# Patient Record
Sex: Male | Born: 1965 | Race: White | Hispanic: No | Marital: Married | Smoking: Former smoker
Health system: Southern US, Community
[De-identification: ages and names within clinical notes are randomized; demographics above are authoritative.]

## PROBLEM LIST (undated history)

## (undated) DIAGNOSIS — R270 Ataxia, unspecified: Secondary | ICD-10-CM

## (undated) DIAGNOSIS — R42 Dizziness and giddiness: Secondary | ICD-10-CM

## (undated) DIAGNOSIS — E785 Hyperlipidemia, unspecified: Secondary | ICD-10-CM

## (undated) DIAGNOSIS — I10 Essential (primary) hypertension: Secondary | ICD-10-CM

## (undated) DIAGNOSIS — I209 Angina pectoris, unspecified: Secondary | ICD-10-CM

## (undated) DIAGNOSIS — R55 Syncope and collapse: Secondary | ICD-10-CM

## (undated) DIAGNOSIS — E119 Type 2 diabetes mellitus without complications: Secondary | ICD-10-CM

## (undated) DIAGNOSIS — I2510 Atherosclerotic heart disease of native coronary artery without angina pectoris: Secondary | ICD-10-CM

## (undated) HISTORY — DX: Atherosclerotic heart disease of native coronary artery without angina pectoris: I25.10

## (undated) HISTORY — DX: Syncope and collapse: R55

## (undated) HISTORY — DX: Ataxia, unspecified: R27.0

## (undated) HISTORY — DX: Dizziness and giddiness: R42

---

## 2008-09-08 ENCOUNTER — Ambulatory Visit (HOSPITAL_COMMUNITY): Admission: RE | Admit: 2008-09-08 | Discharge: 2008-09-09 | Payer: Self-pay | Admitting: Cardiology

## 2008-09-08 IMAGING — CR DG CHEST 2V
2 series · 2 of 2 positions shown · non-contrast
Comparison: None.

CLINICAL DATA: Chest pain, diabetes, history of smoking

CHEST - 2 VIEW

[view not recorded (1 of 2)]
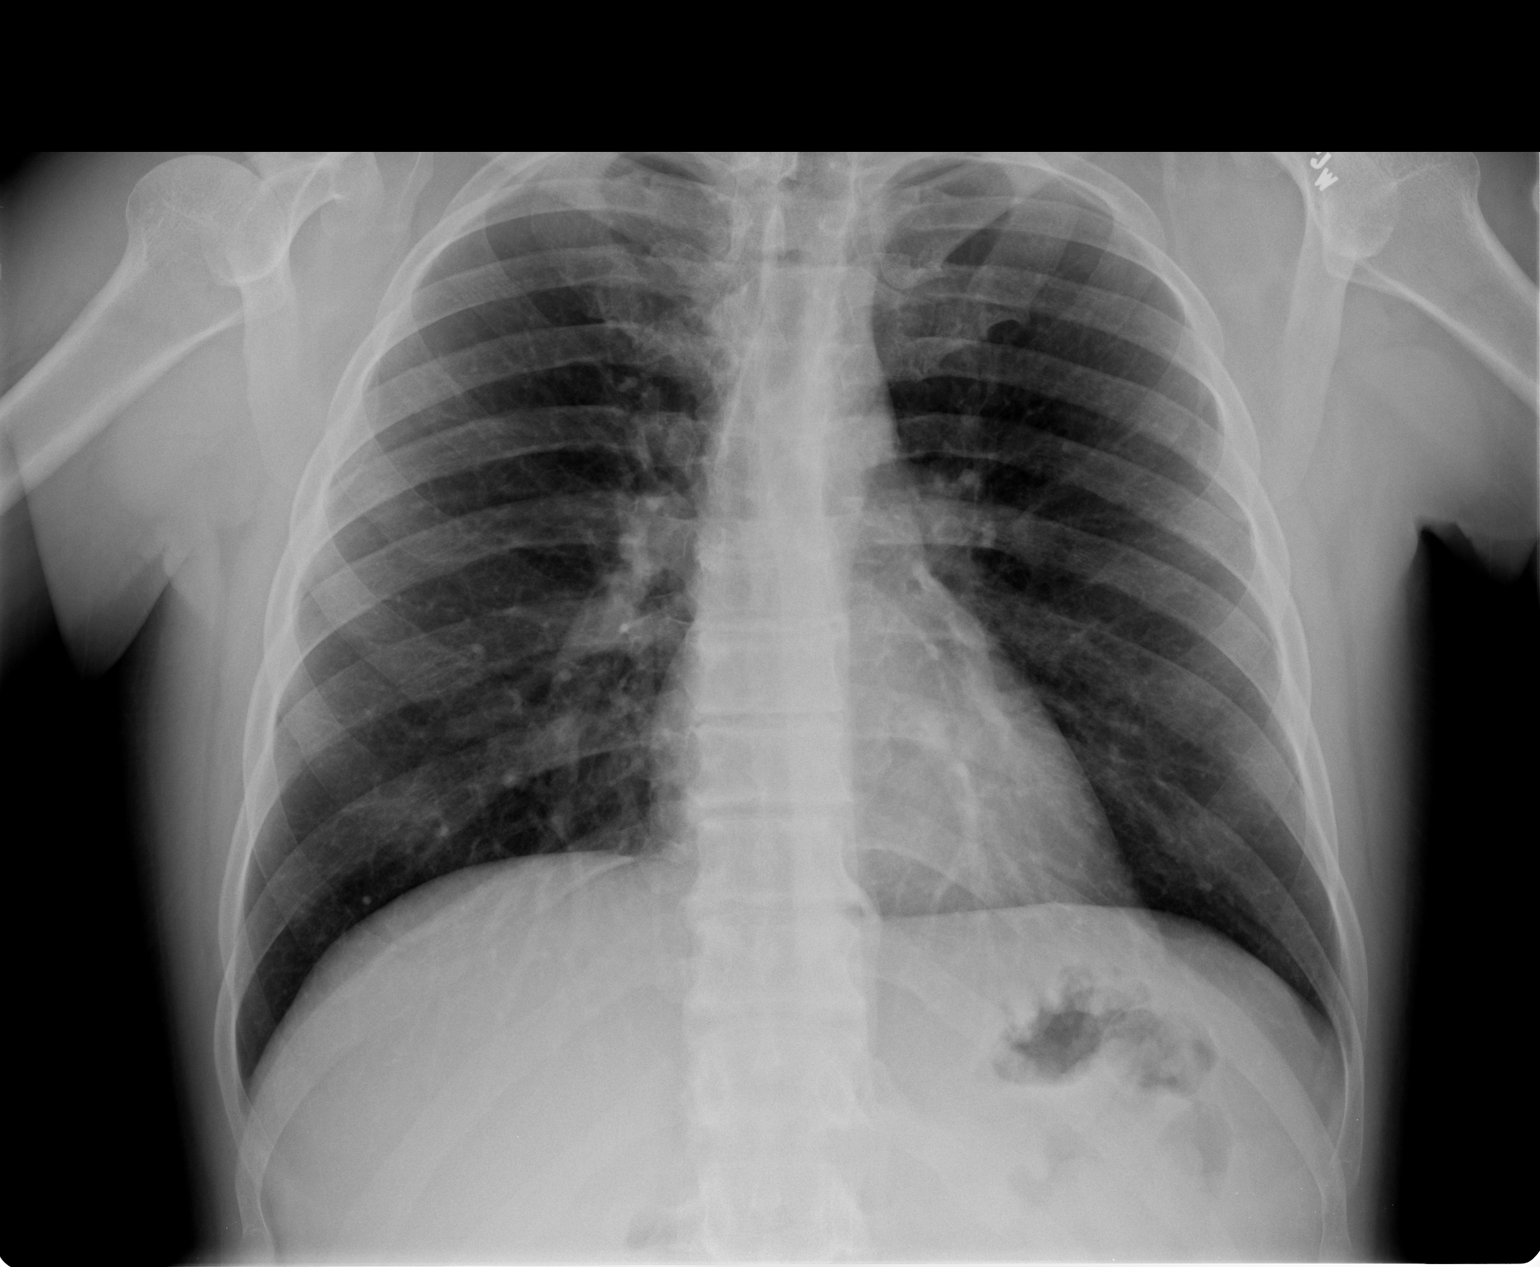

[view not recorded (2 of 2)]
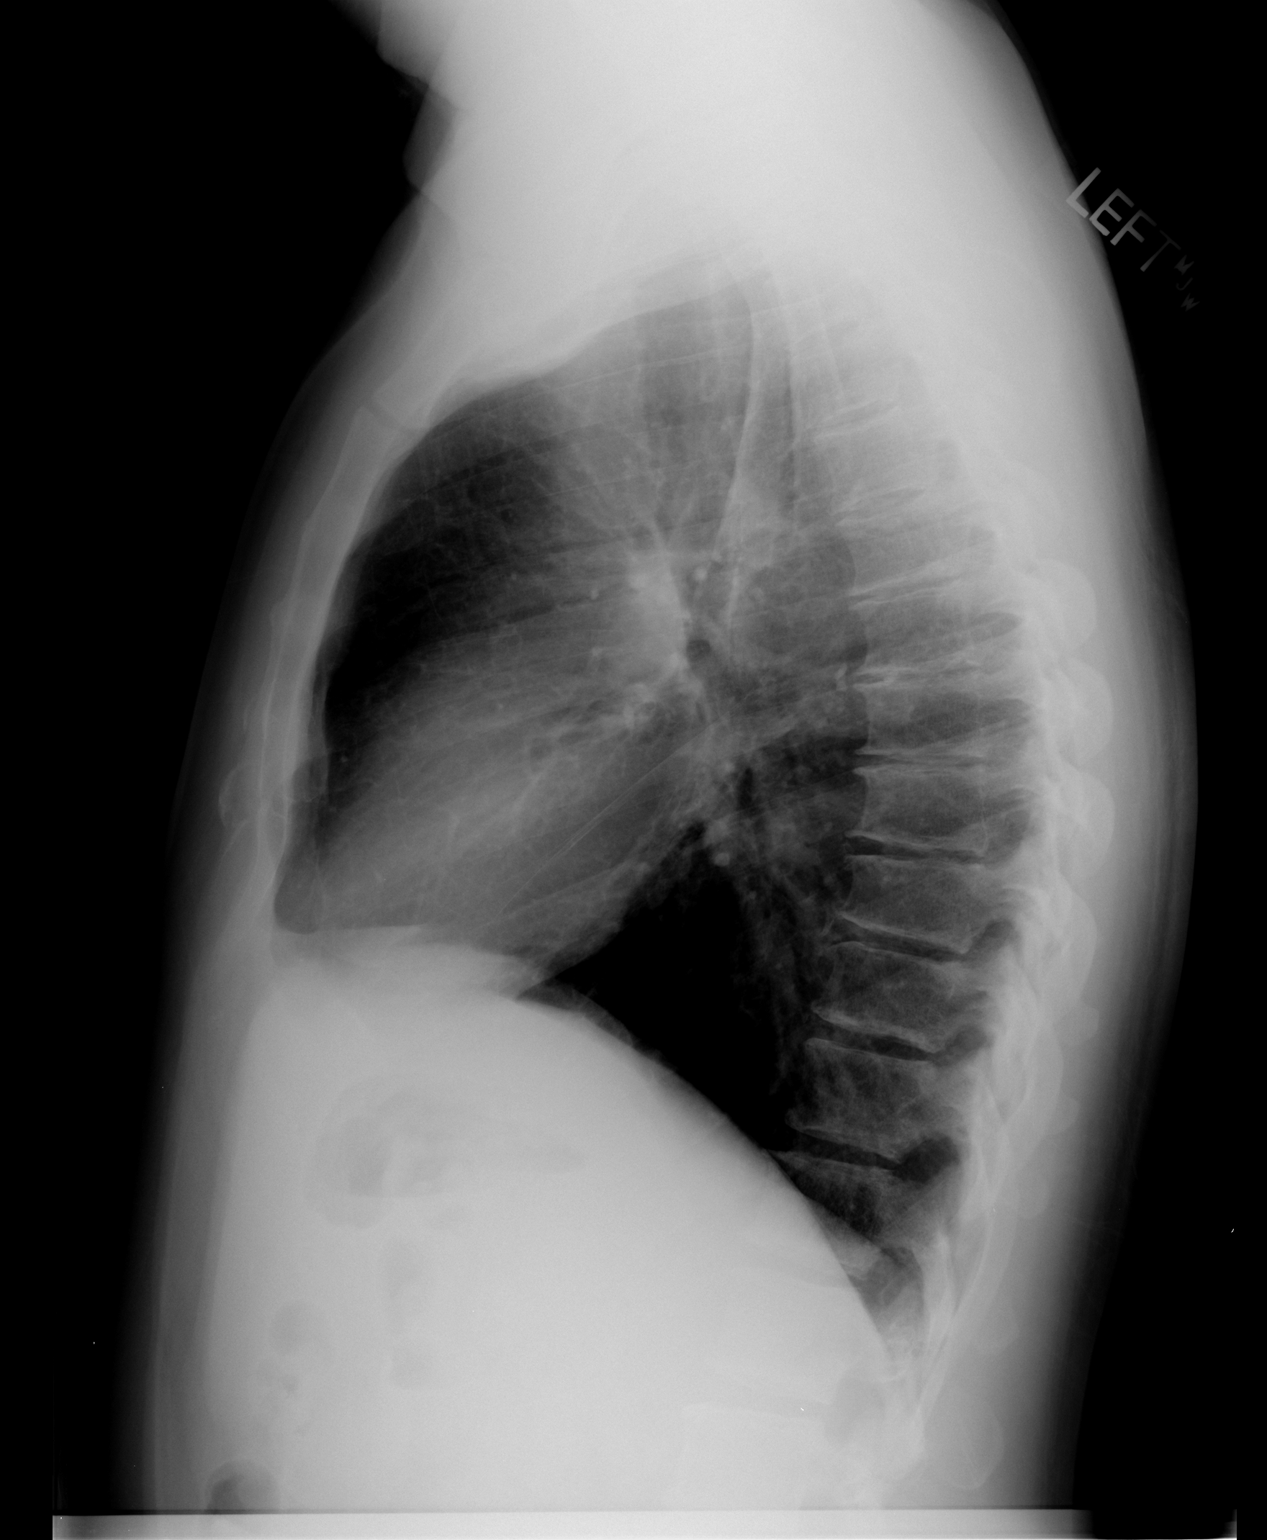

[2 of 2 positions shown; findings below may reference images not displayed]

FINDINGS: The heart size and mediastinal contours are within normal
limits.  Both lungs are clear.  The visualized skeletal structures
are unremarkable.
IMPRESSION: No active cardiopulmonary disease.

## 2010-09-26 LAB — GLUCOSE, CAPILLARY
Glucose-Capillary: 126 mg/dL — ABNORMAL HIGH (ref 70–99)
Glucose-Capillary: 80 mg/dL (ref 70–99)
Glucose-Capillary: 86 mg/dL (ref 70–99)

## 2010-09-26 LAB — URINALYSIS, ROUTINE W REFLEX MICROSCOPIC
Hgb urine dipstick: NEGATIVE
Nitrite: NEGATIVE
Specific Gravity, Urine: 1.016 (ref 1.005–1.030)
Urobilinogen, UA: 1 mg/dL (ref 0.0–1.0)
pH: 7.5 (ref 5.0–8.0)

## 2010-09-26 LAB — CBC
HCT: 44.8 % (ref 39.0–52.0)
Hemoglobin: 15.5 g/dL (ref 13.0–17.0)
MCHC: 34.5 g/dL (ref 30.0–36.0)
MCV: 89.4 fL (ref 78.0–100.0)
RBC: 5.01 MIL/uL (ref 4.22–5.81)

## 2010-09-26 LAB — DIFFERENTIAL
Basophils Relative: 1 % (ref 0–1)
Eosinophils Relative: 2 % (ref 0–5)
Monocytes Absolute: 0.5 10*3/uL (ref 0.1–1.0)
Monocytes Relative: 8 % (ref 3–12)
Neutro Abs: 4.4 10*3/uL (ref 1.7–7.7)

## 2010-09-26 LAB — APTT: aPTT: 27 seconds (ref 24–37)

## 2010-09-26 LAB — BASIC METABOLIC PANEL
CO2: 26 mEq/L (ref 19–32)
Calcium: 9.7 mg/dL (ref 8.4–10.5)
Chloride: 105 mEq/L (ref 96–112)
GFR calc Af Amer: >60 mL/min (ref >60)
Glucose, Bld: 118 mg/dL — ABNORMAL HIGH (ref 70–99)
Potassium: 4.2 mEq/L (ref 3.5–5.1)
Sodium: 137 mEq/L (ref 135–145)

## 2010-10-29 NOTE — Cardiovascular Report (Signed)
NAMEROBEN, SCHLIEP NO.:  [id number]   MEDICAL RECORD NO.:  [id number]          PATIENT TYPE:  OIB   LOCATION:  3703                         FACILITY:  [facility]   PHYSICIAN:  Sheliah Mends, MD      DATE OF BIRTH:  18-Feb-1966   DATE OF PROCEDURE:  09/08/2008  DATE OF DISCHARGE:                            CARDIAC CATHETERIZATION   INDICATIONS:  This is a 45 year old gentleman who presents with sudden  onset of exertional dyspnea and chest pain.  He has multiple risk  factors for coronary artery disease including uncontrolled type 2  diabetes mellitus with an Hb1 Ac of routine, severe dyslipidemia.  His  triglyceride levels in the 500s and a strong family history of coronary  artery disease.  Given the acuity of his symptoms and his risk factor  profile, a decision was made to proceed directly to cardiac  catheterization.   PROCEDURES:  1. Left heart catheterization.  2. Aortic root angiography.  3. Selective coronary angiography.  4. Left ventriculogram.   The patient was brought in the postabsorptive state to the Second Floor  Cath Lab at [facility].  After informed consent was obtained,  he was started on conscious sedation using fentanyl and Versed.  His  right groin was prepped in sterile fashion.  Access to the right femoral  artery was with 5-French sheath using the modified Seldinger technique  was achieved without complications.  Subsequently, selective coronary  angiography using Judkins left and right catheter was performed.  The  left ventriculogram was performed with a standard pigtail catheter.   FINDINGS:  Selective coronary angiography, left main coronary artery is  short.  It has mild irregularities.  It trifurcates in the left anterior  descending coronary artery, ramus intermedius, and left circumflex  artery.  The left anterior descending coronary artery is a large vessel  that wraps around the apex and has mild irregularities in the  mid  segment, it has a medium-size diagonal one branch.  There is no  angiographically significant disease seen at their left-to-right  collaterals noted.   Ramus intermedius.  The ramus mid intermedius is a medium-size vessel.  It has a 40% lesion in the mid segment.   The left circumflex artery.  The left circumflex artery is a medium-  sized vessel that gives off an obtuse marginal one.  It has a 50% lesion  in the mid segment of the vessel that gives off left-to-right  collaterals.   Right coronary artery.  The right coronary artery was visualized with an  aortic route chart.  It is 99% occluded at the ostium in a small vessel.   Left ventriculogram.  Left ventriculogram showed overall preserved left  ventricular function estimated at 60% with mild inferior hypokinesis.  There is no significant mitral regurgitation or aortic regurgitation  noted.   HEMODYNAMICS:  Aortic pressure 109/59 mmHg, left ventricular pressure  109/0 mmHg.   The procedure was completed without complication.  Dr. Daphene Jaeger was  present throughout the entire procedure and prompted the case.      Sheliah Mends, MD  Electronically  Signed     JE/MEDQ  D:  09/08/2008  T:  09/09/2008  Job:  [id number]

## 2010-11-01 NOTE — Discharge Summary (Signed)
[name], [name] NO.:  [id number]   MEDICAL RECORD NO.:  [id number]          PATIENT TYPE:  OIB   LOCATION:  3703                         FACILITY:  [facility]   PHYSICIAN:  Sheliah Mends, MD      DATE OF BIRTH:  1966/04/20   DATE OF ADMISSION:  09/08/2008  DATE OF DISCHARGE:  09/09/2008                               DISCHARGE SUMMARY   DISCHARGE DIAGNOSES:  1. Chest pain.  2. Coronary artery disease with 99% ostial occlusion of the right      coronary artery with left-to-right collaterals and mild inferior      hypokinesis.  3. Diabetes mellitus type 2.  4. Dyslipidemia.   DISCHARGE CONDITION:  Improved.   PROCEDURES:  Combined left heart catheterization on September 08, 2008, at 6  p.m.  Please see dictated report.   DISCHARGE MEDICATIONS:  1. Cymbalta 30 mg daily.  2. Enteric-coated aspirin 81 mg daily.  3. __________ 150 mg daily.  4. Metformin 500 mg 2 twice a day, hold for 2 days.  5. Glipizide 10 mg daily.  6. Pravastatin 20 mg at bedtime.   DISCHARGE INSTRUCTIONS:  Follow up with Dr. Garen Lah in 1 week.  Office  will call with date and time.   HISTORY OF PRESENT ILLNESS:  A 45 year old gentleman seen by Dr.  Garen Lah in the office with complaints of exertional chest pain, chest  pressure associated with radiation to the left arm with fatigue and  general weakness.  He has multiple risk factors for coronary artery  disease with presentation to his primary care with uncontrolled diabetes  and hemoglobin A1c of greater than 14.  He also has dyslipidemia with  triglycerides of 826 and HDL of 24.  He was started on fenofibrate.  Dr.  Garen Lah saw him on March 25, and due to his multiple risk factors and  with an abnormal resting EKG, it was felt he should be further evaluated  with cardiac catheterization.  He underwent cardiac catheterization as  described.  Due to the lateness of the hour for the cardiac cath, he was  kept overnight on telemetry and  was seen the next morning after  ambulating, and the patient was ready for discharge home.   He will follow up as an outpatient with Dr. Garen Lah.   PHYSICAL EXAMINATION:  VITAL SIGNS:  Blood pressure at discharge 99/63,  pulse 62, respiratory rate 18, temperature 97.  LUNGS:  Clear to auscultation bilaterally.  HEART:  Regular rate and rhythm.  ABDOMEN:  Benign.  EXTREMITIES:  Without edema.  Cath site stable.   LABORATORY DATA:  Hemoglobin on the 26th 15, hematocrit 44, WBC 6.9,  platelets 191, neutrophils 65, lymph 25, mono 8, eos 2, baso 1.  Protime  13, INR of 1, PTT 27.  Sodium 137, potassium 4.2, chloride 105, CO2 of  26, glucose 118, BUN 12, and creatinine 0.65.  Urinalysis was clear  except ketones were 15.      [name]. [name], N.P.      Sheliah Mends, MD  Electronically Signed    LRI/MEDQ  D:  09/19/2008  T:  09/20/2008  Job:  [id number]   cc:   Louanna Raw

## 2020-09-20 ENCOUNTER — Emergency Department (HOSPITAL_COMMUNITY): Payer: Medicaid Other

## 2020-09-20 ENCOUNTER — Encounter (HOSPITAL_COMMUNITY): Payer: Self-pay | Admitting: Emergency Medicine

## 2020-09-20 ENCOUNTER — Emergency Department (HOSPITAL_COMMUNITY)
Admission: EM | Admit: 2020-09-20 | Discharge: 2020-09-20 | Disposition: A | Discharge status: Home / Self Care | Payer: Medicaid Other | Attending: Emergency Medicine | Admitting: Emergency Medicine

## 2020-09-20 ENCOUNTER — Other Ambulatory Visit: Payer: Self-pay

## 2020-09-20 DIAGNOSIS — Z794 Long term (current) use of insulin: Secondary | ICD-10-CM | POA: Diagnosis not present

## 2020-09-20 DIAGNOSIS — R42 Dizziness and giddiness: Secondary | ICD-10-CM | POA: Diagnosis present

## 2020-09-20 DIAGNOSIS — R270 Ataxia, unspecified: Secondary | ICD-10-CM | POA: Insufficient documentation

## 2020-09-20 DIAGNOSIS — E119 Type 2 diabetes mellitus without complications: Secondary | ICD-10-CM | POA: Insufficient documentation

## 2020-09-20 DIAGNOSIS — R9431 Abnormal electrocardiogram [ECG] [EKG]: Secondary | ICD-10-CM | POA: Diagnosis not present

## 2020-09-20 DIAGNOSIS — Z7982 Long term (current) use of aspirin: Secondary | ICD-10-CM | POA: Diagnosis not present

## 2020-09-20 DIAGNOSIS — I2510 Atherosclerotic heart disease of native coronary artery without angina pectoris: Secondary | ICD-10-CM | POA: Insufficient documentation

## 2020-09-20 DIAGNOSIS — Z7984 Long term (current) use of oral hypoglycemic drugs: Secondary | ICD-10-CM | POA: Diagnosis not present

## 2020-09-20 HISTORY — DX: Type 2 diabetes mellitus without complications: E11.9

## 2020-09-20 LAB — CBC
HCT: 43.1 % (ref 39.0–52.0)
Hemoglobin: 15.1 g/dL (ref 13.0–17.0)
MCH: 30.4 pg (ref 26.0–34.0)
MCHC: 35 g/dL (ref 30.0–36.0)
MCV: 86.9 fL (ref 80.0–100.0)
Platelets: 259 10*3/uL (ref 150–400)
RBC: 4.96 MIL/uL (ref 4.22–5.81)
RDW: 12.6 % (ref 11.5–15.5)
WBC: 8 10*3/uL (ref 4.0–10.5)
nRBC: 0 % (ref 0.0–0.2)

## 2020-09-20 LAB — TROPONIN I (HIGH SENSITIVITY)
Troponin I (High Sensitivity): 30 ng/L — ABNORMAL HIGH (ref <18)
Troponin I (High Sensitivity): 25 ng/L — ABNORMAL HIGH (ref <18)

## 2020-09-20 LAB — BASIC METABOLIC PANEL
Anion gap: 7 (ref 5–15)
BUN: 20 mg/dL (ref 6–20)
CO2: 29 mmol/L (ref 22–32)
Calcium: 10.1 mg/dL (ref 8.9–10.3)
Chloride: 99 mmol/L (ref 98–111)
Creatinine, Ser: 0.88 mg/dL (ref 0.61–1.24)
GFR, Estimated: >60 mL/min (ref >60)
Glucose, Bld: 206 mg/dL — ABNORMAL HIGH (ref 70–99)
Potassium: 4.8 mmol/L (ref 3.5–5.1)
Sodium: 135 mmol/L (ref 135–145)

## 2020-09-20 IMAGING — CT CT ANGIO HEAD
1 of 11 series · 5 of 33 positions shown · IV contrast (OMNI 350)
Comparison: None.

CLINICAL DATA: Dizziness



[Series 11: cta neck axial · axial · 0.39mm/px · z∈[+1033,+1241]mm · 5 of 312 slices shown]
[im 52/312  soft-tissue]
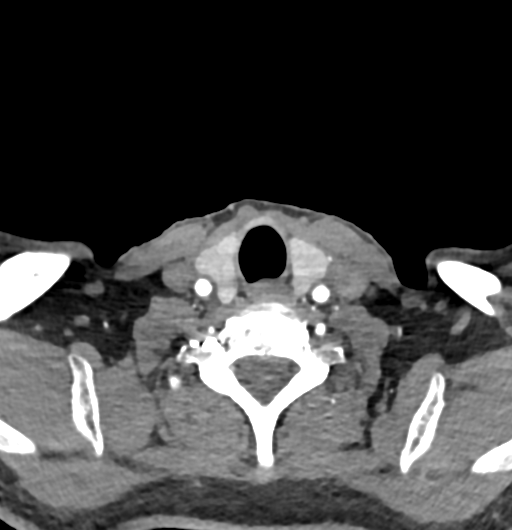
[im 104/312  bone]
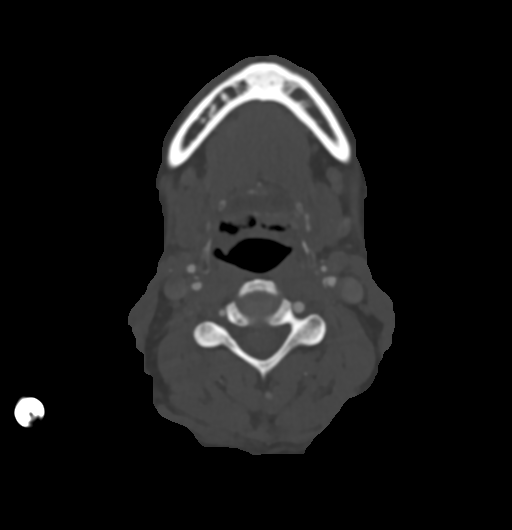
[im 156/312  soft-tissue]
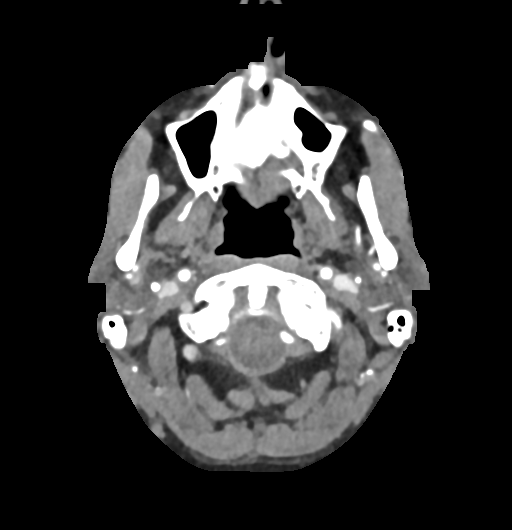
[im 208/312  bone]
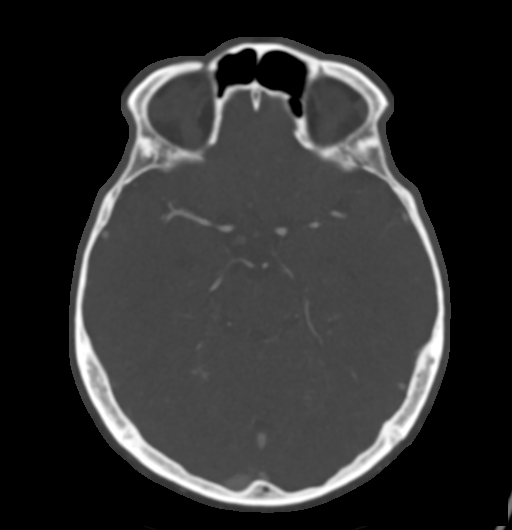
[im 260/312  soft-tissue]
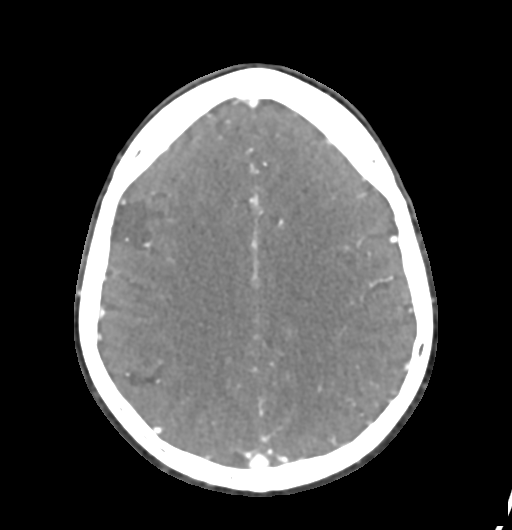

[5 of 33 positions shown; findings below may reference images not displayed]

FINDINGS: CT HEAD FINDINGS

Brain: There is no mass, hemorrhage or extra-axial collection. The
size and configuration of the ventricles and extra-axial CSF spaces
are normal. There is no acute or chronic infarction. The brain
parenchyma is normal.

Skull: The visualized skull base, calvarium and extracranial soft
tissues are normal.

Sinuses/Orbits: No fluid levels or advanced mucosal thickening of
the visualized paranasal sinuses. No mastoid or middle ear effusion.
The orbits are normal.

CTA NECK FINDINGS

SKELETON: There is no bony spinal canal stenosis. No lytic or
blastic lesion.

OTHER NECK: Normal pharynx, larynx and major salivary glands. No
cervical lymphadenopathy. Unremarkable thyroid gland.

UPPER CHEST: No pneumothorax or pleural effusion. No nodules or
masses.

RIGHT CAROTID SYSTEM: Carotid web at the level of the bifurcation.
There is noncalcified plaque within the proximal ICA causes stenosis
of 50%.

LEFT CAROTID SYSTEM: Carotid web. Mixed density atherosclerosis
within the proximal ICA with less than 50% stenosis.

VERTEBRAL ARTERIES: Left dominant configuration. Severe narrowing of
the left origin. Both V2 and V3 segments are normal.

CTA HEAD FINDINGS

POSTERIOR CIRCULATION:

--Vertebral arteries: Normal V4 segments.

--Inferior cerebellar arteries: Normal.

--Basilar artery: Normal.

--Superior cerebellar arteries: Normal.

--Posterior cerebral arteries (PCA): Normal.

ANTERIOR CIRCULATION:

--Intracranial internal carotid arteries: Atherosclerotic
calcification of the internal carotid arteries at the skull base
without hemodynamically significant stenosis.

--Anterior cerebral arteries (ACA): Normal. Both A1 segments are
present. Patent anterior communicating artery (a-comm).

--Middle cerebral arteries (MCA): Normal.

VENOUS SINUSES: As permitted by contrast timing, patent.

ANATOMIC VARIANTS: None

Review of the MIP images confirms the above findings.
IMPRESSION: 1. No emergent large vessel occlusion or high-grade stenosis of the
intracranial arteries.
2. Bilateral carotid webs with 50% stenosis of the proximal right
ICA and less than 50% stenosis of the proximal left ICA. Both
regions could serve as embolic sources.
3. Severe narrowing of the left vertebral artery origin.

## 2020-09-20 IMAGING — US US AORTA
1 series · 14 of 25 positions shown · non-contrast
Comparison: None.

CLINICAL DATA: Screen for AAA

EXAM:
ULTRASOUND OF ABDOMINAL AORTA
TECHNIQUE: Ultrasound examination of the abdominal aorta and proximal common
iliac arteries was performed to evaluate for aneurysm. Additional
color and Doppler images of the distal aorta were obtained to
document patency.

[Series 1: us aorta · 14 of 25 slices shown]
[im 1/25]
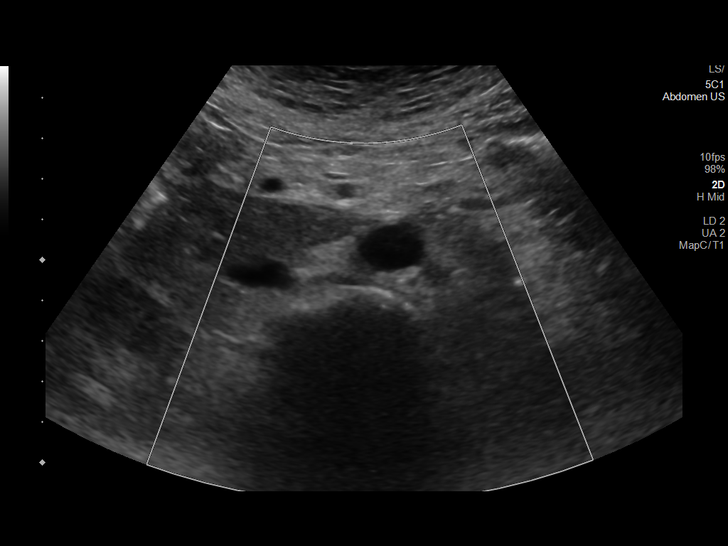
[im 3/25]
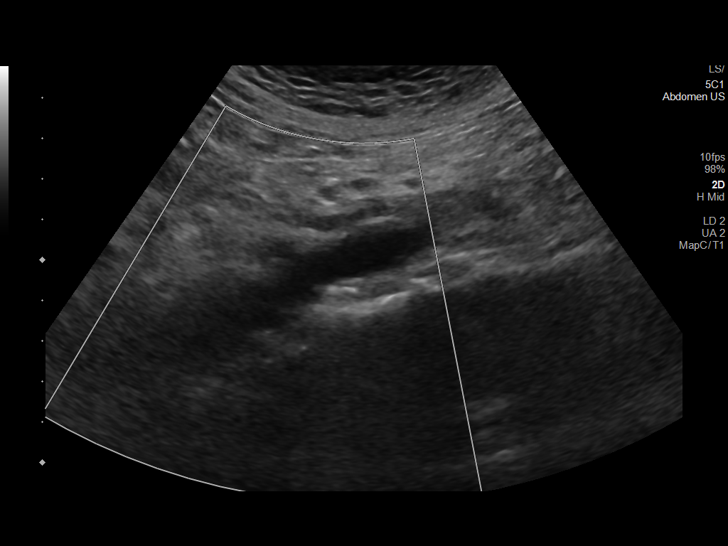
[im 5/25]
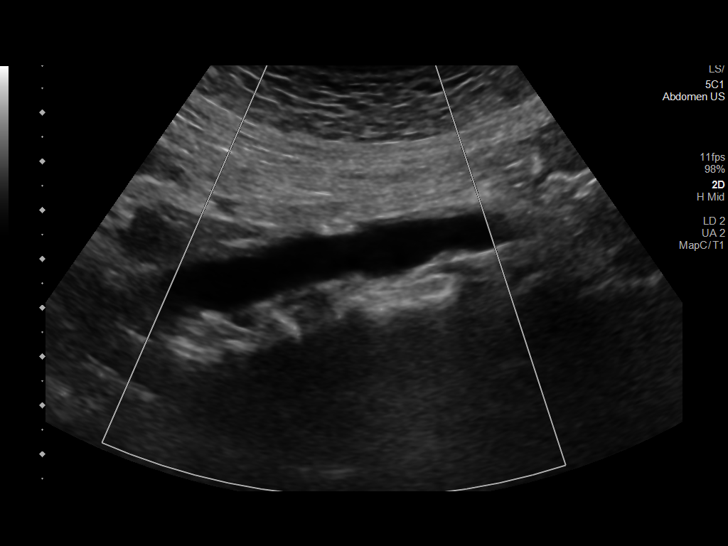
[im 7/25]
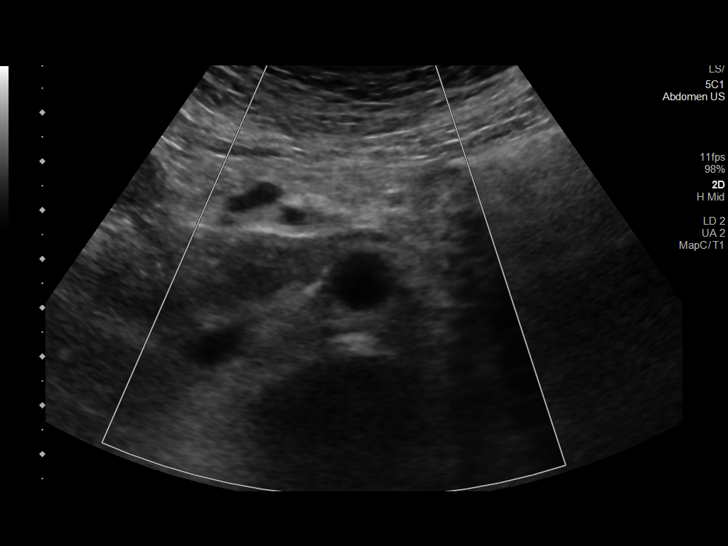
[im 9/25]
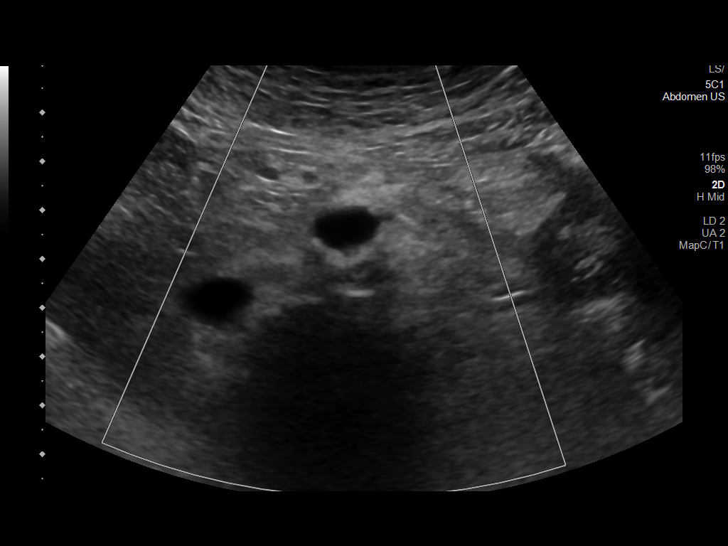
[im 10/25]
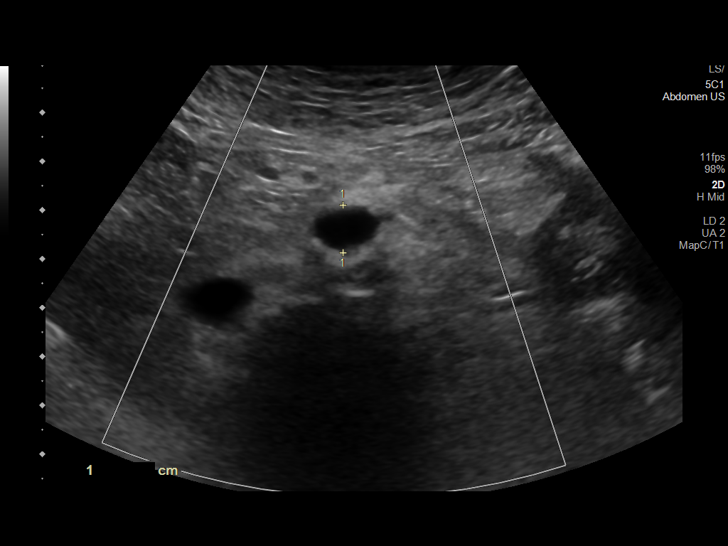
[im 12/25]
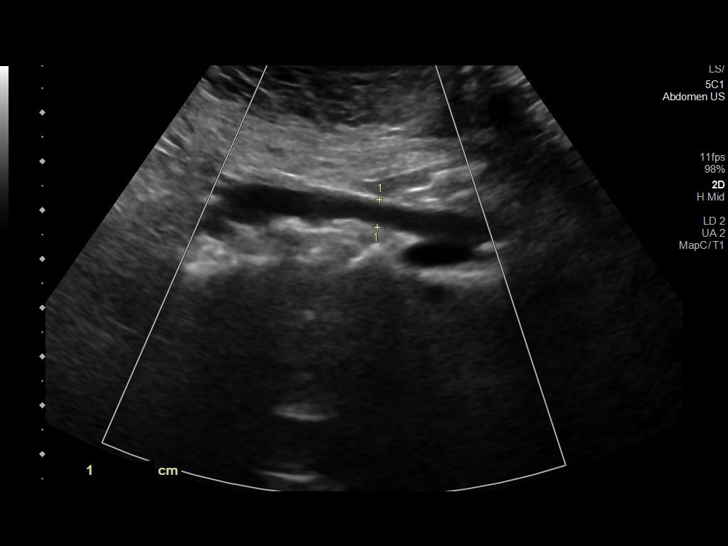
[im 14/25]
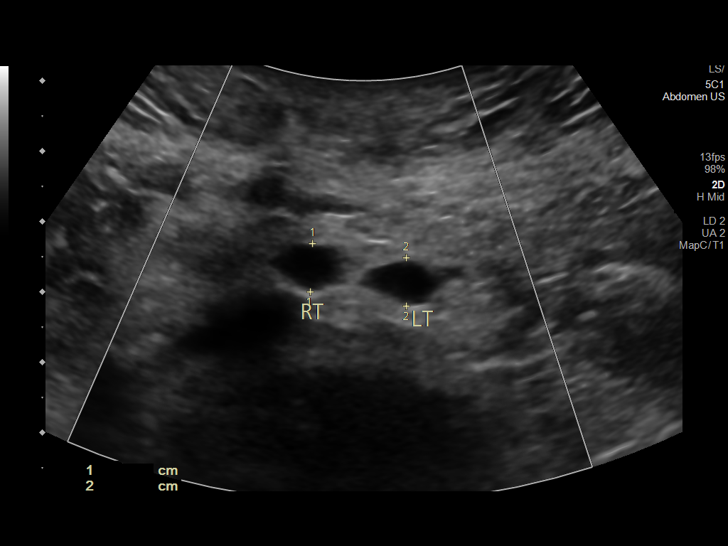
[im 16/25]
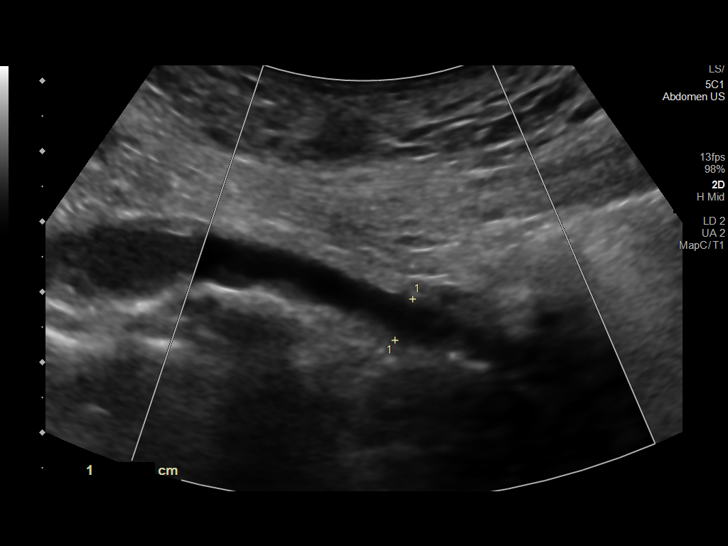
[im 17/25]
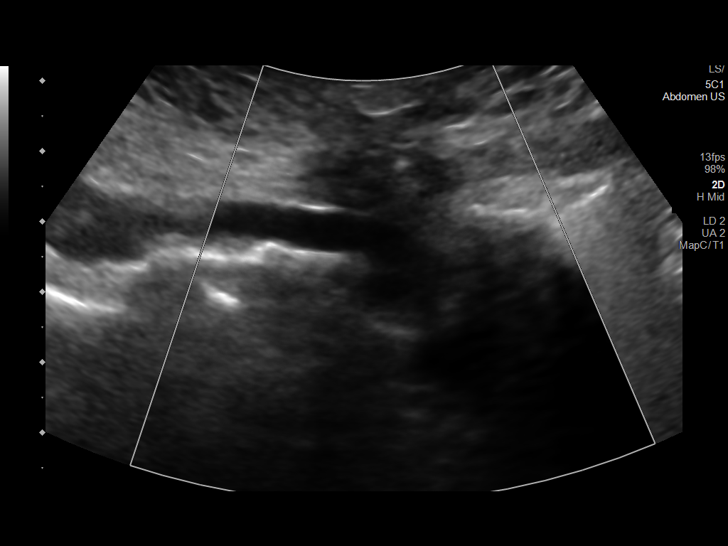
[im 19/25]
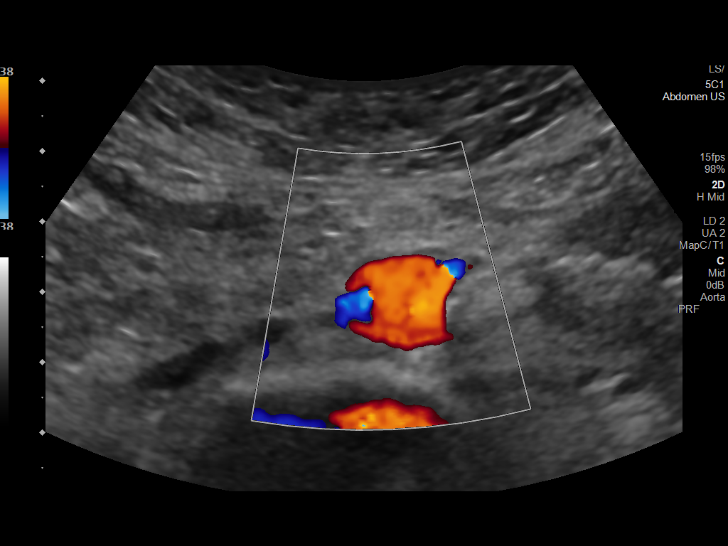
[im 21/25]
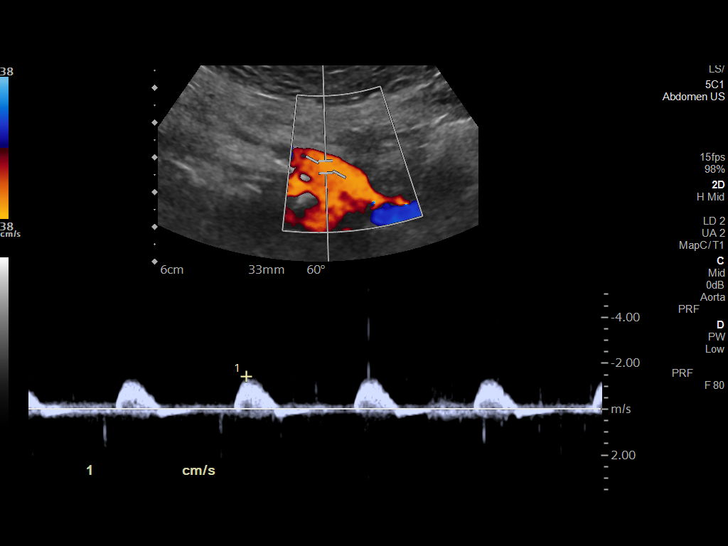
[im 23/25]
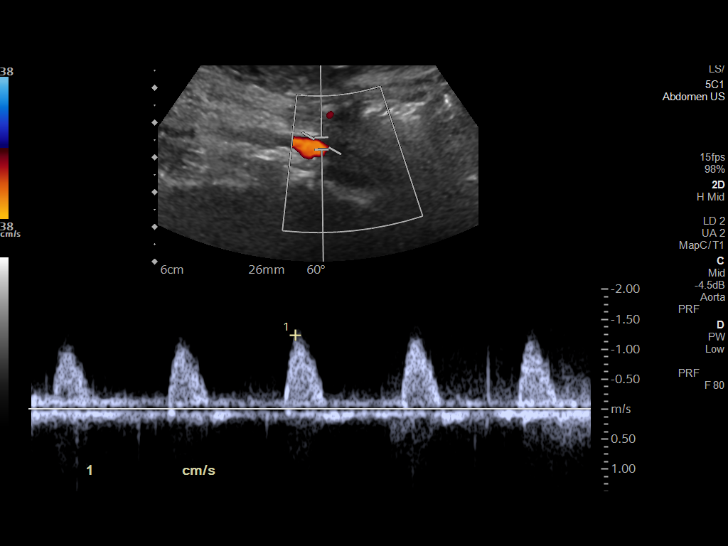
[im 25/25]
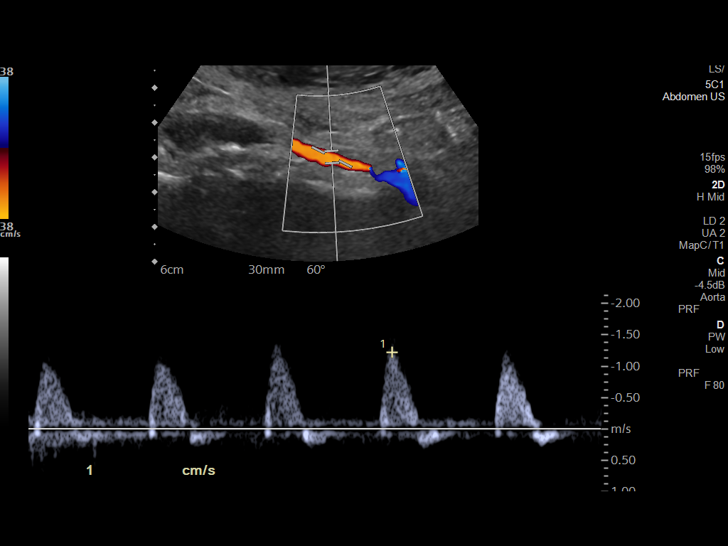

[14 of 25 positions shown; findings below may reference images not displayed]

FINDINGS: Abdominal aortic measurements as follows:

Proximal:  1.5 cm

Mid:  1.3 cm

Distal:  1.0 cm
Patent: Yes, peak systolic velocity is 142 cm/s

Right common iliac artery: 0.7 cm

Left common iliac artery: 0.7 cm
IMPRESSION: No evidence of abdominal aortic aneurysm.

## 2020-09-20 IMAGING — CR DG CHEST 2V
2 series · 2 of 2 positions shown · non-contrast
Comparison: CT chest [DATE].  Chest x-ray [DATE].

CLINICAL DATA: Chest pain.

EXAM:
CHEST - 2 VIEW

[chest pa]
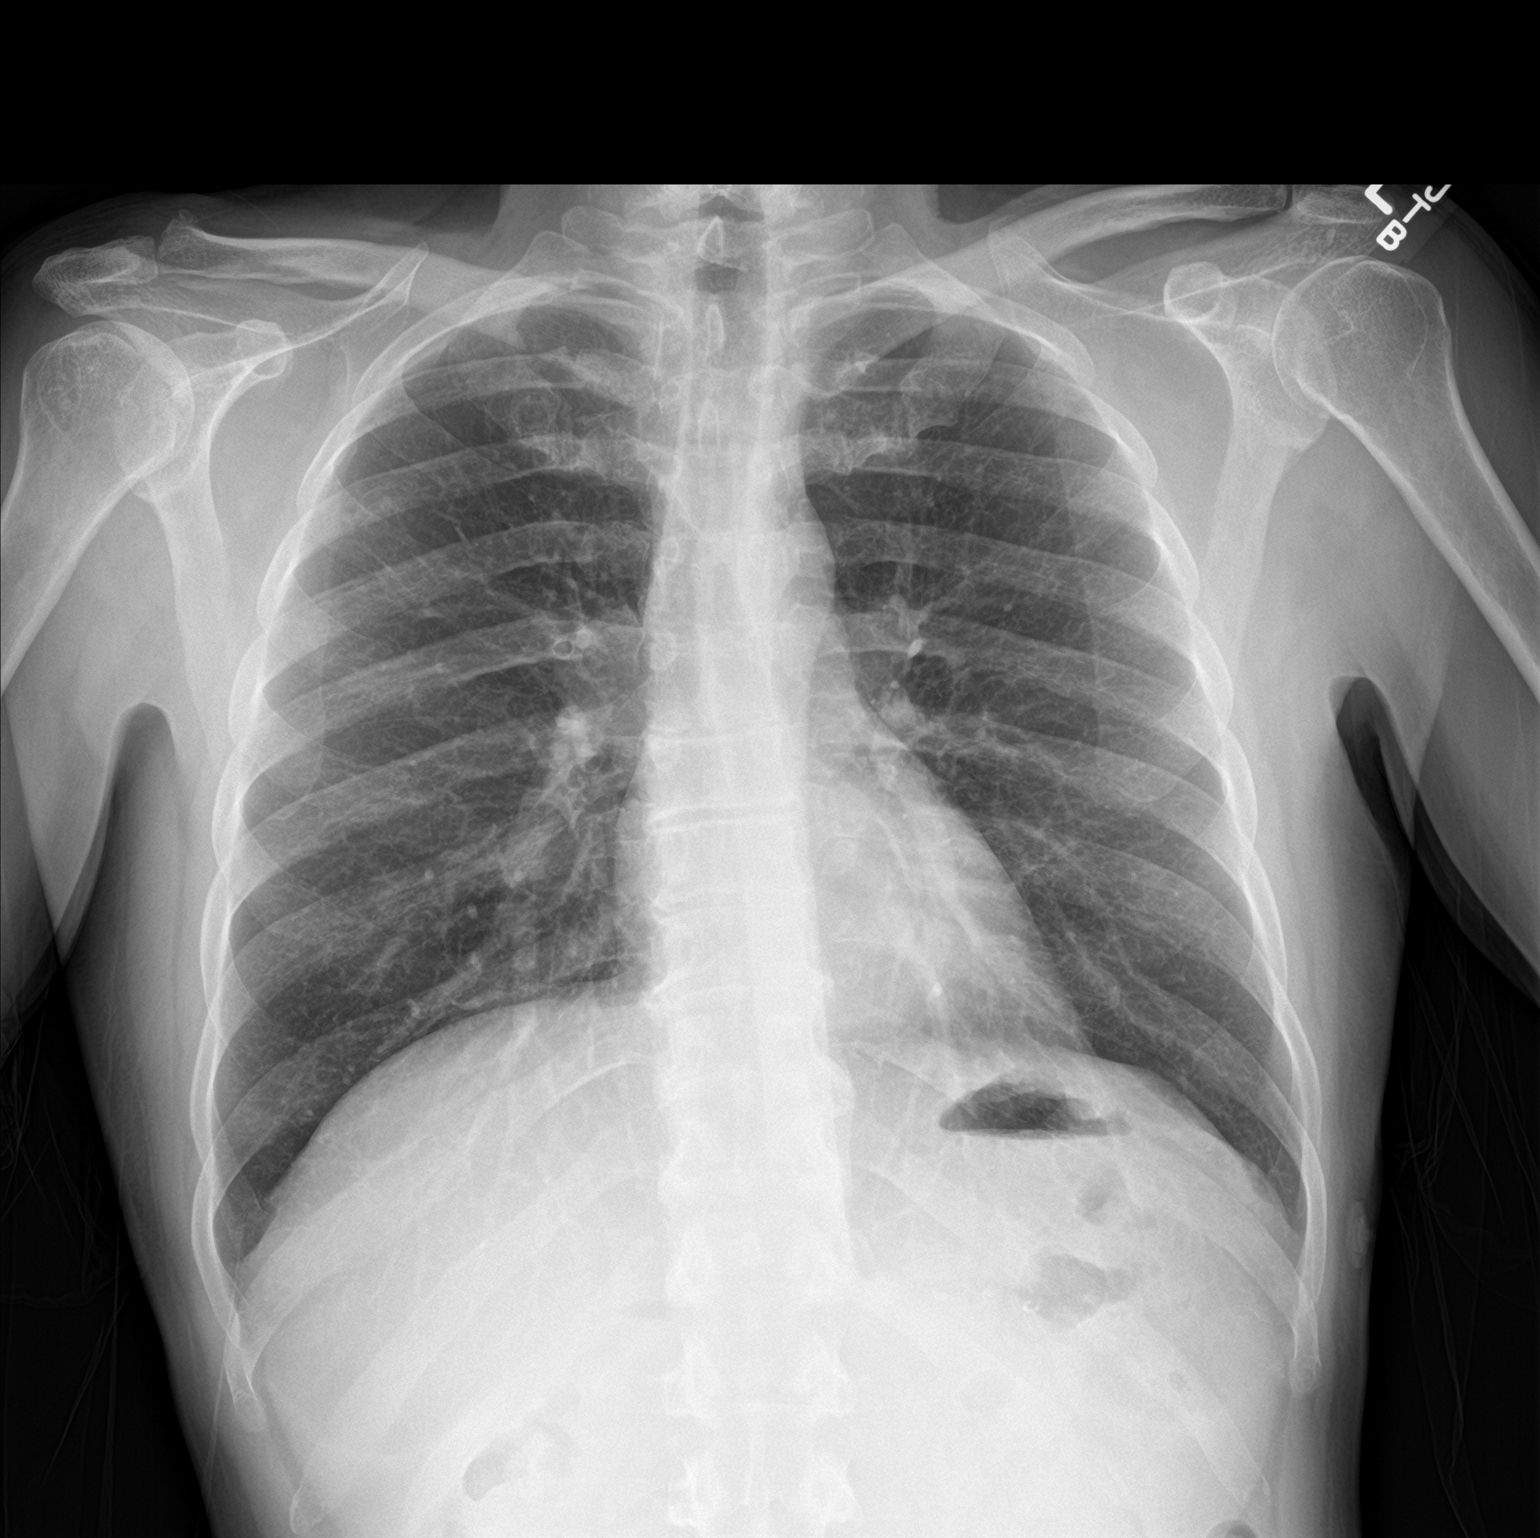

[chest lat]
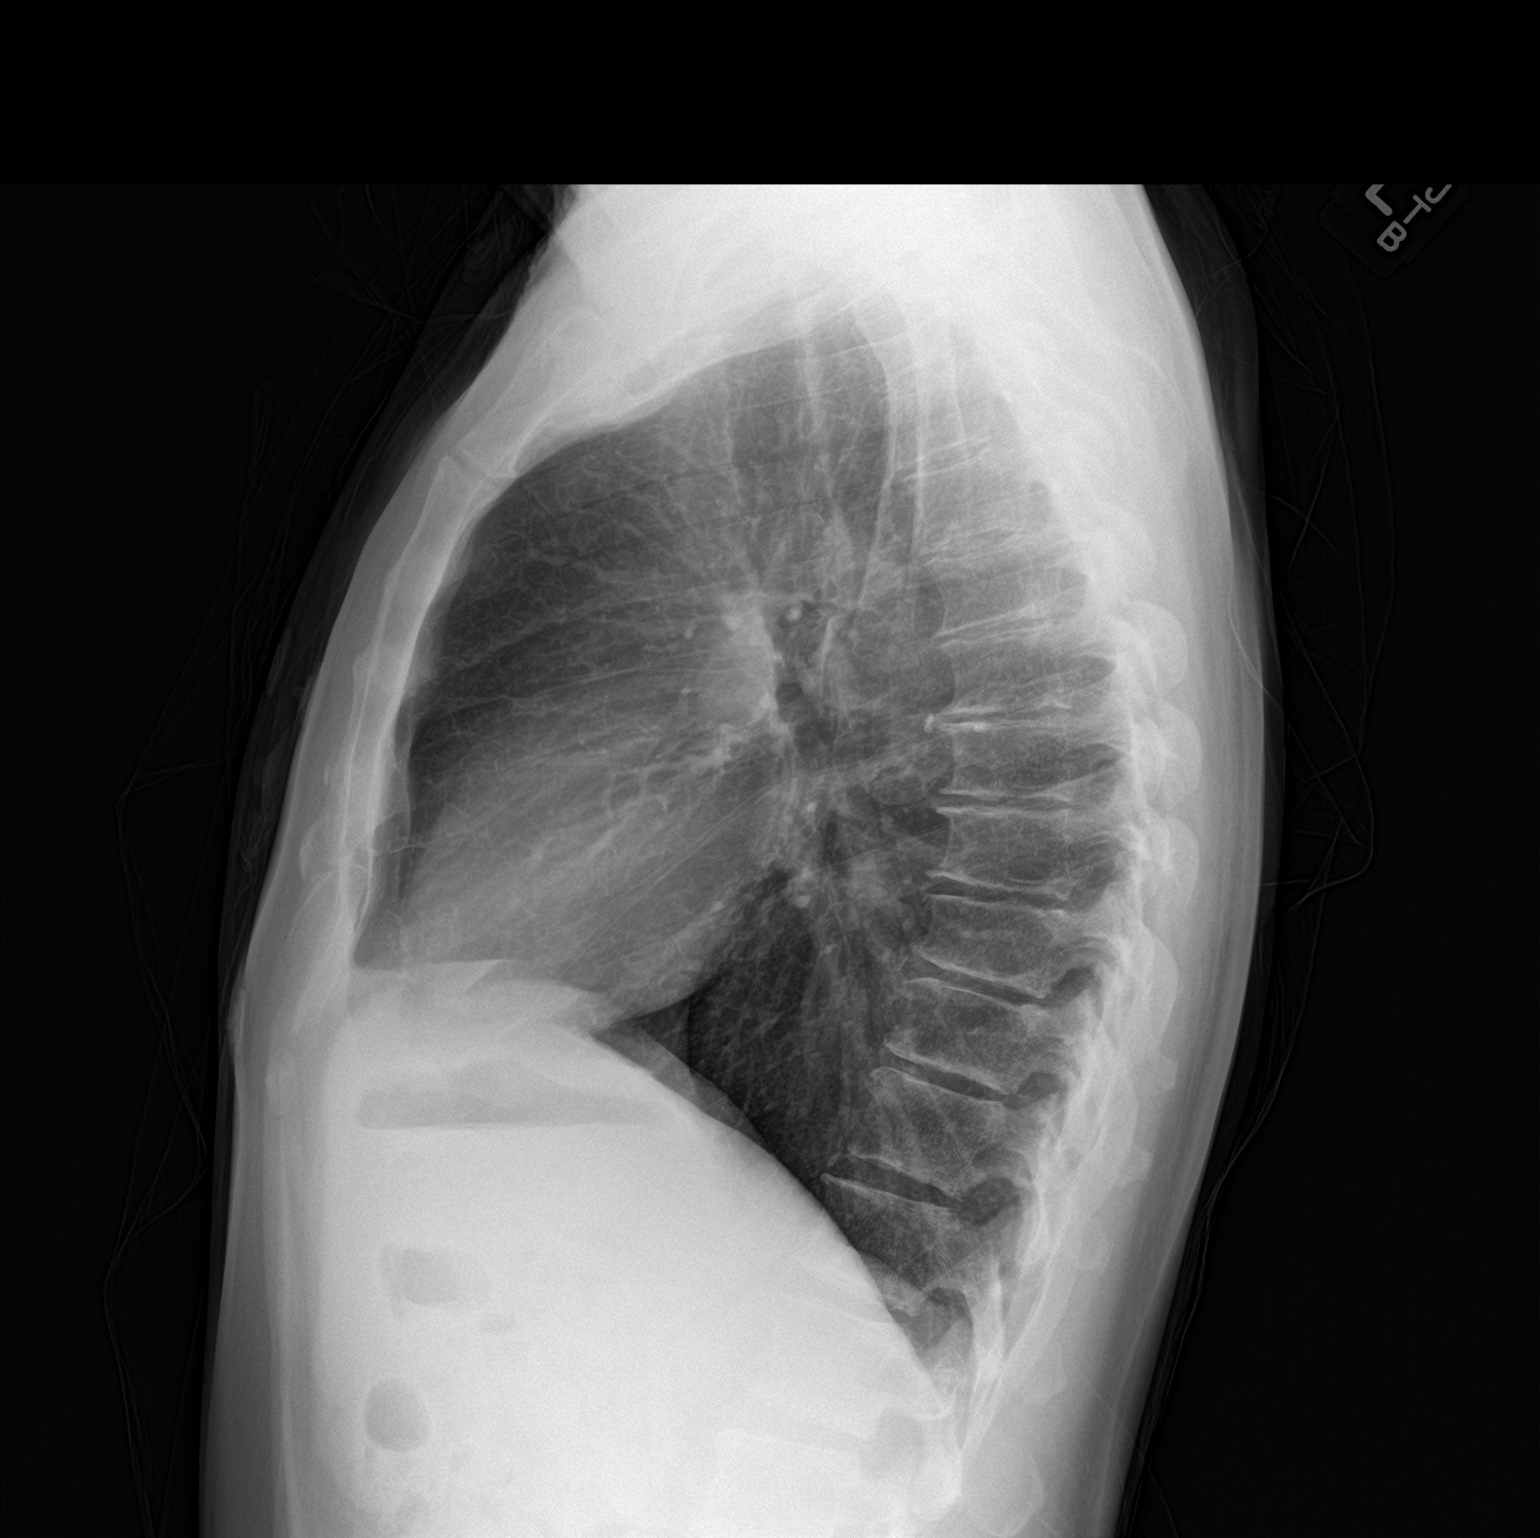

[2 of 2 positions shown; findings below may reference images not displayed]

FINDINGS: Mediastinum and hilar structures normal. Lungs are clear. No pleural
effusion or pneumothorax. Heart size normal. No acute bony
abnormality. Degenerative change thoracic spine.
IMPRESSION: No acute cardiopulmonary disease.

## 2020-09-20 IMAGING — MR MR HEAD W/O CM
6 of 11 series · 24 of 48 positions shown · non-contrast
Comparison: Prior CTA from earlier the same day.

CLINICAL DATA: Initial evaluation for acute dizziness.

EXAM:
MRI HEAD WITHOUT CONTRAST
TECHNIQUE: Multiplanar, multiecho pulse sequences of the brain and surrounding
structures were obtained without intravenous contrast.

[Series 2: DWI · axial · 3.0mm · 0.94mm/px · z∈[-76,+70]mm · 7 of 105 slices shown (1 of 2)]
[im 1/105]
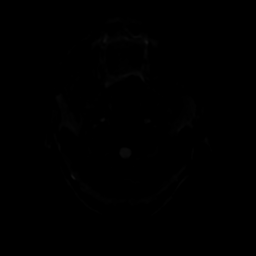
[im 18/105]
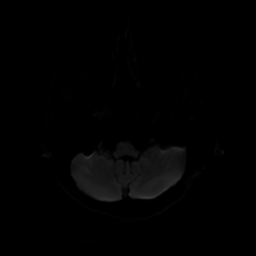
[im 35/105]
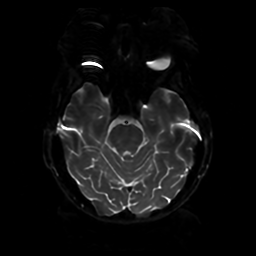
[im 53/105]
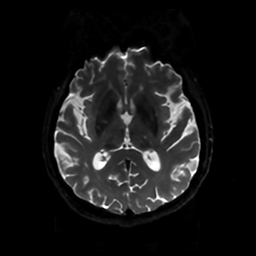
[im 70/105]
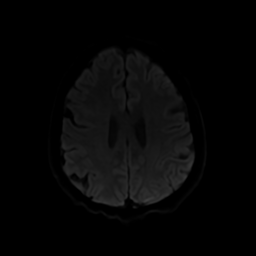
[im 87/105]
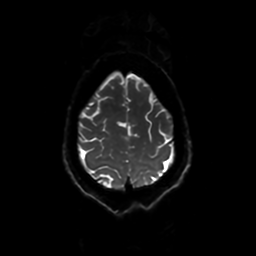
[im 105/105]
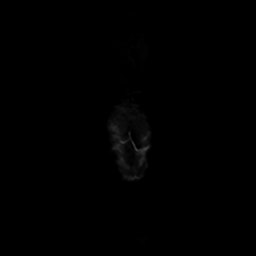

[Series 3: DWI · coronal · 4.0mm · 0.94mm/px · 6 of 74 slices shown (2 of 2)]
[im 1/74]
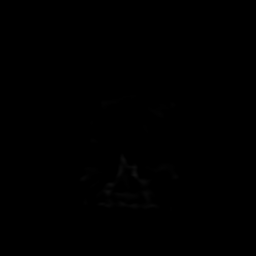
[im 15/74]
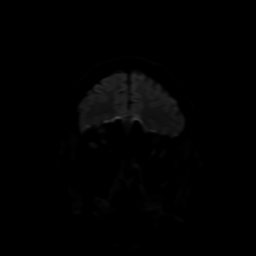
[im 30/74]
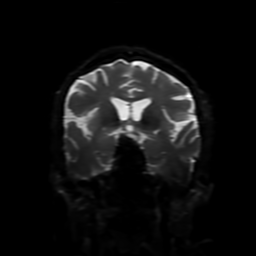
[im 44/74]
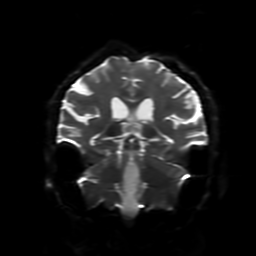
[im 59/74]
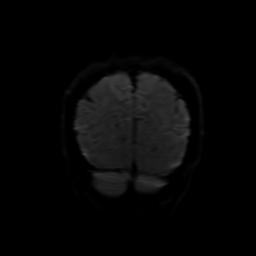
[im 74/74]
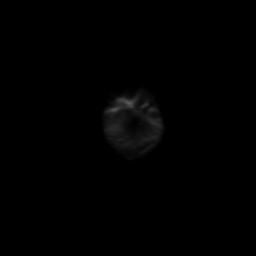

[Series 4: FLAIR · sagittal · 5.0mm · 0.23mm/px · 2 of 28 slices shown (1 of 2)]
[im 1/28]
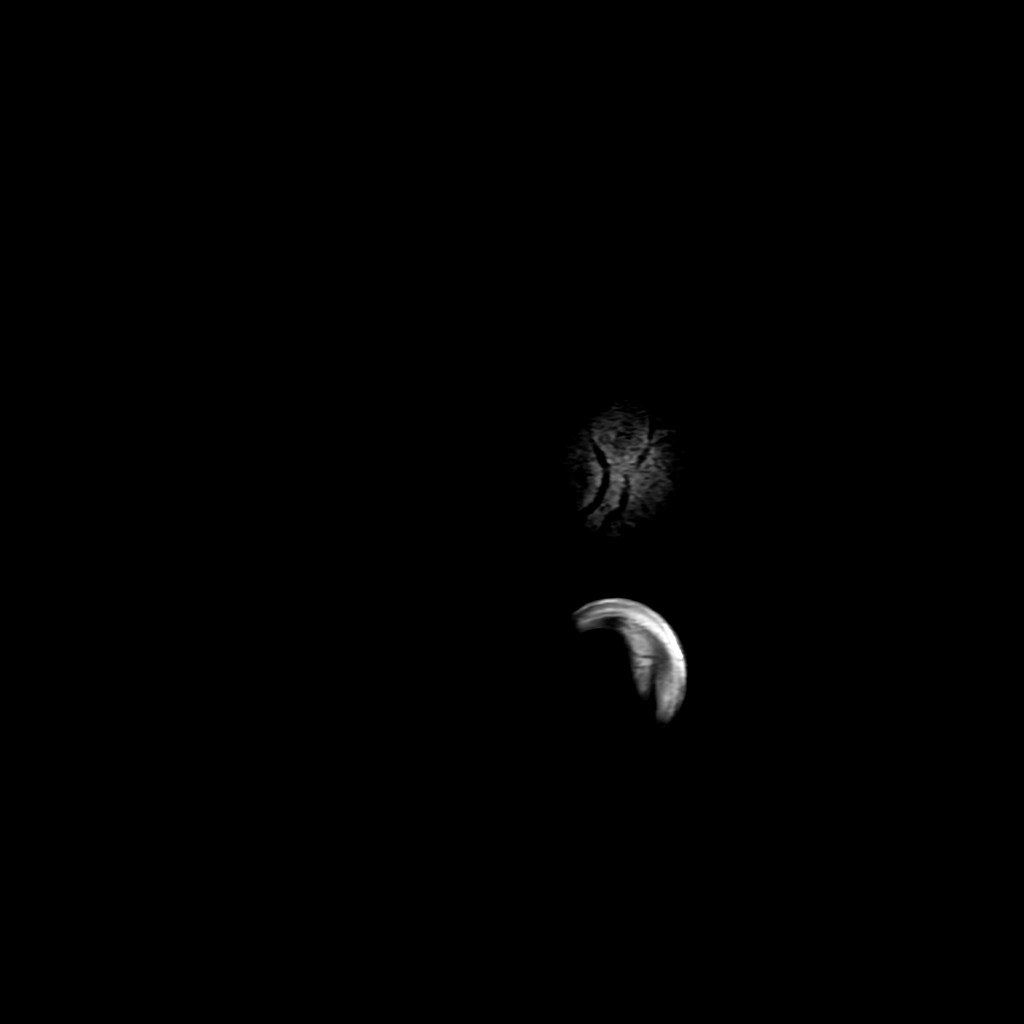
[im 28/28]
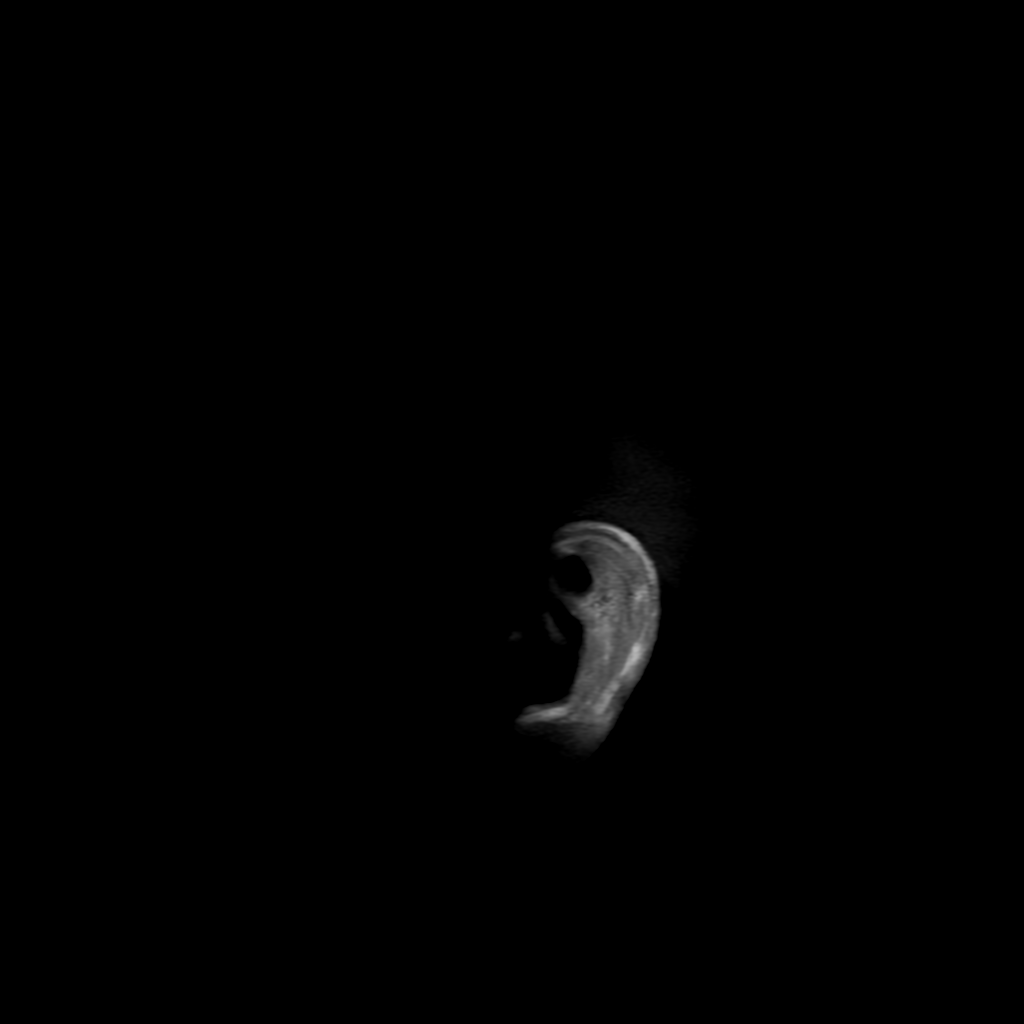

[Series 6: FLAIR · axial · 3.0mm · 0.45mm/px · z∈[-67,+80]mm · 2 of 27 slices shown (2 of 2)]
[im 1/27]
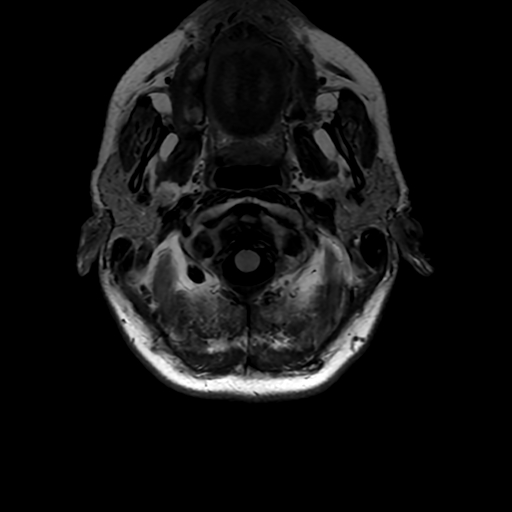
[im 27/27]
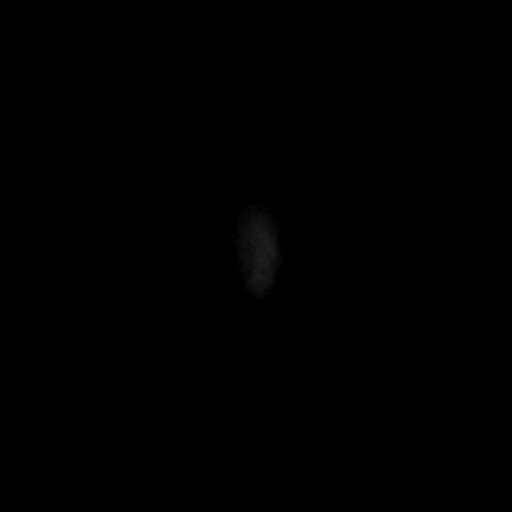

[Series 250: ADC · axial · 3.0mm · 0.94mm/px · z∈[-76,+70]mm · 4 of 53 slices shown (1 of 2)]
[im 1/53]
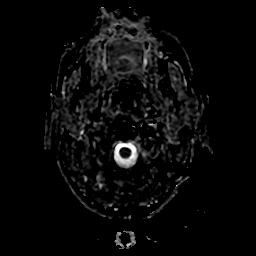
[im 18/53]
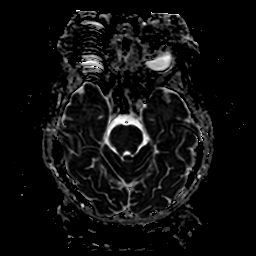
[im 35/53]
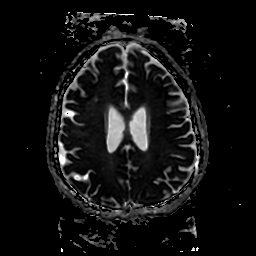
[im 53/53]
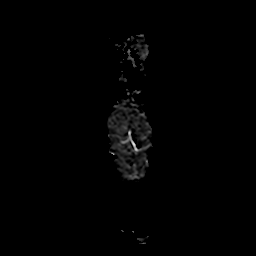

[Series 350: ADC · coronal · 4.0mm · 0.94mm/px · 3 of 37 slices shown (2 of 2)]
[im 1/37]
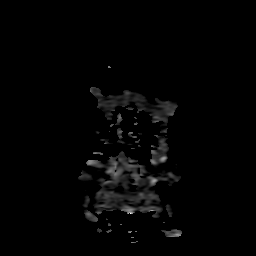
[im 19/37]
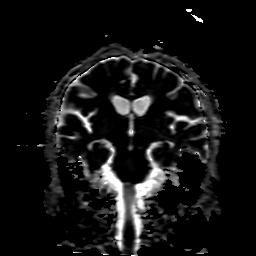
[im 37/37]
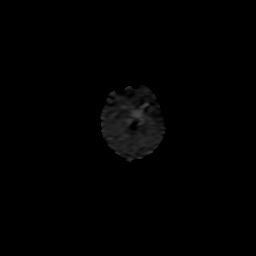

[24 of 48 positions shown; findings below may reference images not displayed]

FINDINGS: Brain: Cerebral volume within normal limits. Patchy T2/FLAIR
hyperintensity within the periventricular and deep white matter both
cerebral hemispheres most consistent with chronic small vessel
ischemic disease, mild in nature. Patchy involvement of the pons.
Remote lacunar infarct present at the subcortical white matter of
the anterior left frontal lobe (series 6, image 21). Apparent
diffusion abnormality at this location felt to be consistent with T2
shine through (series 2, image 41). Additional small remote lacunar
infarct present at the left thalamus.

No other foci of restricted diffusion to suggest acute or subacute
ischemia. Gray-white matter differentiation otherwise maintained. No
other areas of encephalomalacia to suggest chronic cortical
infarction. No acute intracranial hemorrhage. Single punctate
chronic microhemorrhage noted at the subcortical left parietal lobe,
likely small vessel related.

No mass lesion, midline shift or mass effect. No hydrocephalus or
extra-axial fluid collection. Pituitary gland suprasellar region
within normal limits. Midline structures intact and normal.

Vascular: Major intracranial vascular flow voids are maintained.

Skull and upper cervical spine: Craniocervical junction within
normal limits. Bone marrow signal intensity normal. No scalp soft
tissue abnormality.

Sinuses/Orbits: Globes and orbital soft tissues within normal
limits. Paranasal sinuses are largely clear. No mastoid effusion.
Inner ear structures grossly normal.

Other: None.
IMPRESSION: 1. No acute intracranial infarct or other abnormality.
2. Underlying mild chronic microvascular ischemic disease, with
small remote lacunar infarcts at the subcortical left frontal lobe
and left thalamus.

## 2020-09-20 MED ORDER — ASPIRIN 81 MG PO CHEW: 81.0000 mg | CHEWABLE_TABLET | Freq: Every day | ORAL | 1 refills | Status: AC

## 2020-09-20 MED ORDER — IOHEXOL 350 MG/ML SOLN
80.0000 mL | Freq: Once | INTRAVENOUS | Status: AC | PRN
  Administered 2020-09-20: 80 mL via INTRAVENOUS

## 2020-09-20 MED ORDER — SODIUM CHLORIDE 0.9 % IV BOLUS
500.0000 mL | Freq: Once | INTRAVENOUS | Status: AC
  Administered 2020-09-20: 500 mL via INTRAVENOUS

## 2020-09-20 NOTE — ED Provider Notes (Signed)
[facility]   CSN: [id number] Arrival date & time: 09/20/20  1516     History Chief Complaint  Patient presents with  . Abnormal ECG    Benjamin SilenceBobby Stark is a 55 y.o. male.  HPI    55 year old male comes in a chief complaint of abnormal EKG. Patient has history of diabetes, nonobstructive CAD, heavy smoking history.  Patient reports that over the last several months he has had dizziness.  The dizziness is intermittent, unprovoked and usually will last 5 to 10 minutes.  If patient is standing, he will feel like he is can a fall down.  His symptoms of dizziness are worse at night and he feels unstable when he walks.  There is no associated slurred speech, vision disturbance, focal numbness or weakness.  In general patient has been feeling weak for the last several months as well.  He denies any chest pain, back pain but does indicate that he gets fatigued easily with work.  Patient went to see his PCP.  He had EKG there was concerning to them, therefore they sent him to the ER.  Review of system is positive for intermittent chest pain.  Past Medical History:  Diagnosis Date  . Diabetes mellitus without complication (HCC)     There are no problems to display for this patient.     No family history on file.     Home Medications Prior to Admission medications   Medication Sig Start Date End Date Taking? Authorizing Provider  aspirin 81 MG chewable tablet Chew 1 tablet (81 mg total) by mouth daily. 09/20/20  Yes Derwood KaplanNanavati, Zaivion Kundrat, MD  atorvastatin (LIPITOR) 40 MG tablet Take 40 mg by mouth daily. 07/06/20  Yes [provider]  busPIRone (BUSPAR) 10 MG tablet Take 10 mg by mouth 2 (two) times daily. 07/06/20  Yes [provider]  carvedilol (COREG) 3.125 MG tablet Take 3.125 mg by mouth 2 (two) times daily. 07/06/20  Yes [provider]  CYMBALTA 60 MG capsule Take 60 mg by mouth daily. 07/06/20  Yes [provider]  gabapentin (NEURONTIN) 300 MG capsule Take 300 mg by mouth 3 (three) times daily. 09/07/20  Yes [provider]  glipiZIDE (GLUCOTROL) 10 MG tablet Take 10 mg by mouth 2 (two) times daily. 07/06/20  Yes [provider]  Insulin Glargine (BASAGLAR KWIKPEN) 100 UNIT/ML Inject 10 Units into the skin daily. 07/06/20  Yes [provider]  lisinopril (ZESTRIL) 10 MG tablet Take 10 mg by mouth daily. 07/06/20  Yes [provider]  meclizine (ANTIVERT) 25 MG tablet Take 25 mg by mouth 3 (three) times daily as needed for dizziness. 09/07/20  Yes [provider]  metFORMIN (GLUCOPHAGE) 1000 MG tablet Take 500 mg by mouth 2 (two) times daily. 07/06/20  Yes [provider]    Allergies    Patient has no known allergies.  Review of Systems   Review of Systems  Constitutional: Positive for activity change.  Eyes: Negative for visual disturbance.  Respiratory: Negative for chest tightness and shortness of breath.   Cardiovascular: Positive for chest pain.  Gastrointestinal: Negative for nausea and vomiting.  Neurological: Positive for dizziness.  All other systems reviewed and are negative.   Physical Exam Updated Vital Signs BP 135/81   Pulse 88   Temp 97.8 F (36.6 C) (Oral)   Resp 17   Ht 5\' 7"  (1.702 m)   Wt 59 kg   SpO2 92%  BMI 20.36 kg/m   Physical Exam Vitals and nursing note reviewed.  Constitutional:      Appearance: He is well-developed.  HENT:     Head: Atraumatic.  Eyes:     Extraocular Movements: Extraocular movements intact.     Pupils: Pupils are equal, round, and reactive to light.  Cardiovascular:     Rate and Rhythm: Normal rate.     Heart sounds: No murmur heard.     Comments: 1+ and equal bilateral radial and dorsalis pedis Pulmonary:     Effort: Pulmonary effort is normal.  Musculoskeletal:     Cervical back: Neck supple.  Skin:    General: Skin is warm.  Neurological:     Mental Status:  He is alert and oriented to person, place, and time.     Cranial Nerves: No cranial nerve deficit.     Sensory: No sensory deficit.     ED Results / Procedures / Treatments   Labs (all labs ordered are listed, but only abnormal results are displayed) Labs Reviewed  BASIC METABOLIC PANEL - Abnormal; Notable for the following components:      Result Value   Glucose, Bld 206 (*)    All other components within normal limits  TROPONIN I (HIGH SENSITIVITY) - Abnormal; Notable for the following components:   Troponin I (High Sensitivity) 30 (*)    All other components within normal limits  TROPONIN I (HIGH SENSITIVITY) - Abnormal; Notable for the following components:   Troponin I (High Sensitivity) 25 (*)    All other components within normal limits  CBC    EKG EKG Interpretation  Date/Time:  Thursday September 20 2020 15:28:39 EDT Ventricular Rate:  85 PR Interval:  152 QRS Duration: 78 QT Interval:  316 QTC Calculation: 376 R Axis:   55 Text Interpretation: Normal sinus rhythm T wave abnormality, consider inferior ischemia Abnormal ECG subtle ST elevation in V1, V2 No old tracing to compare Confirmed by Derwood Kaplan [phone] on 09/20/2020 6:37:16 PM   Radiology CT Angio Head W or Wo Contrast  Result Date: 09/20/2020 CLINICAL DATA:  Dizziness EXAM: CT ANGIOGRAPHY HEAD AND NECK TECHNIQUE: Multidetector CT imaging of the head and neck was performed using the standard protocol during bolus administration of intravenous contrast. Multiplanar CT image reconstructions and MIPs were obtained to evaluate the vascular anatomy. Carotid stenosis measurements (when applicable) are obtained utilizing NASCET criteria, using the distal internal carotid diameter as the denominator. CONTRAST:  80mL OMNIPAQUE IOHEXOL 350 MG/ML SOLN COMPARISON:  None. FINDINGS: CT HEAD FINDINGS Brain: There is no mass, hemorrhage or extra-axial collection. The size and configuration of the ventricles and extra-axial CSF  spaces are normal. There is no acute or chronic infarction. The brain parenchyma is normal. Skull: The visualized skull base, calvarium and extracranial soft tissues are normal. Sinuses/Orbits: No fluid levels or advanced mucosal thickening of the visualized paranasal sinuses. No mastoid or middle ear effusion. The orbits are normal. CTA NECK FINDINGS SKELETON: There is no bony spinal canal stenosis. No lytic or blastic lesion. OTHER NECK: Normal pharynx, larynx and major salivary glands. No cervical lymphadenopathy. Unremarkable thyroid gland. UPPER CHEST: No pneumothorax or pleural effusion. No nodules or masses. 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 There is noncalcified plaque within the proximal ICA causes stenosis of 50%. LEFT CAROTID SYSTEM: Carotid web. Mixed density atherosclerosis within the proximal ICA with less than 50% stenosis. VERTEBRAL ARTERIES: Left dominant configuration. Severe narrowing of the left origin. Both  V2 and V3 segments are normal. CTA HEAD FINDINGS POSTERIOR CIRCULATION: --Vertebral arteries: Normal V4 segments. --Inferior cerebellar arteries: Normal. --Basilar artery: Normal. --Superior cerebellar arteries: Normal. --Posterior cerebral arteries (PCA): Normal. ANTERIOR CIRCULATION: --Intracranial internal carotid arteries: Atherosclerotic calcification of the internal carotid arteries at the skull base without hemodynamically significant stenosis. --Anterior cerebral arteries (ACA): Normal. Both A1 segments are present. Patent anterior communicating artery (a-comm). --Middle cerebral arteries (MCA): Normal. VENOUS SINUSES: As permitted by contrast timing, patent. ANATOMIC VARIANTS: None Review of the MIP images confirms the above findings. IMPRESSION: 1. No emergent large vessel occlusion or high-grade stenosis of the intracranial arteries. 2. 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 Both regions could serve as embolic sources. 3. 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 Electronically Signed   By: Deatra Robinson M.D.   On: 09/20/2020 20:01   DG Chest 2 View  Result Date: 09/20/2020 CLINICAL DATA:  Chest pain. EXAM: CHEST - 2 VIEW COMPARISON:  CT chest 08/12/2017.  Chest x-ray 11/08/2008. FINDINGS: Mediastinum and hilar structures normal. Lungs are clear. No pleural effusion or pneumothorax. Heart size normal. No acute bony abnormality. Degenerative change thoracic spine. IMPRESSION: No acute cardiopulmonary disease. Electronically Signed   By: Maisie Fus  Register   On: 09/20/2020 16:05   CT Angio Neck W and/or Wo Contrast  Result Date: 09/20/2020 CLINICAL DATA:  Dizziness EXAM: CT ANGIOGRAPHY HEAD AND NECK TECHNIQUE: Multidetector CT imaging of the head and neck was performed using the standard protocol during bolus administration of intravenous contrast. Multiplanar CT image reconstructions and MIPs were obtained to evaluate the vascular anatomy. Carotid stenosis measurements (when applicable) are obtained utilizing NASCET criteria, using the distal internal carotid diameter as the denominator. CONTRAST:  74mL OMNIPAQUE IOHEXOL 350 MG/ML SOLN COMPARISON:  None. FINDINGS: CT HEAD FINDINGS Brain: There is no mass, hemorrhage or extra-axial collection. The size and configuration of the ventricles and extra-axial CSF spaces are normal. There is no acute or chronic infarction. The brain parenchyma is normal. Skull: The visualized skull base, calvarium and extracranial soft tissues are normal. Sinuses/Orbits: No fluid levels or advanced mucosal thickening of the visualized paranasal sinuses. No mastoid or middle ear effusion. The orbits are normal. CTA NECK FINDINGS SKELETON: There is no bony spinal canal stenosis. No lytic or blastic lesion. OTHER NECK: Normal pharynx, larynx and major salivary glands. No cervical lymphadenopathy. Unremarkable thyroid gland. UPPER CHEST: No  pneumothorax or pleural effusion. No nodules or masses. 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 There is noncalcified plaque within the proximal ICA causes stenosis of 50%. LEFT CAROTID SYSTEM: Carotid web. Mixed density atherosclerosis within the proximal ICA with less than 50% stenosis. VERTEBRAL ARTERIES: Left dominant configuration. Severe narrowing of the left origin. Both V2 and V3 segments are normal. CTA HEAD FINDINGS POSTERIOR CIRCULATION: --Vertebral arteries: Normal V4 segments. --Inferior cerebellar arteries: Normal. --Basilar artery: Normal. --Superior cerebellar arteries: Normal. --Posterior cerebral arteries (PCA): Normal. ANTERIOR CIRCULATION: --Intracranial internal carotid arteries: Atherosclerotic calcification of the internal carotid arteries at the skull base without hemodynamically significant stenosis. --Anterior cerebral arteries (ACA): Normal. Both A1 segments are present. Patent anterior communicating artery (a-comm). --Middle cerebral arteries (MCA): Normal. VENOUS SINUSES: As permitted by contrast timing, patent. ANATOMIC VARIANTS: None Review of the MIP images confirms the above findings. IMPRESSION: 1. No emergent large vessel occlusion or high-grade stenosis of the intracranial arteries. 2. Bilateral carotid webs with 50%  stenosis of the proximal right ICA and less than 50% stenosis of the proximal left ICA. Both regions could serve as embolic sources. 3. 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 Electronically Signed   By: Deatra Robinson M.D.   On: 09/20/2020 20:01   MR BRAIN WO CONTRAST  Result Date: 09/20/2020 CLINICAL DATA:  Initial evaluation for acute dizziness. EXAM: MRI HEAD WITHOUT CONTRAST TECHNIQUE: Multiplanar, multiecho pulse sequences of the brain and surrounding structures were obtained without intravenous contrast. COMPARISON:  Prior CTA from earlier the same day. FINDINGS: Brain: Cerebral volume within normal limits. Patchy  T2/FLAIR hyperintensity within the periventricular and deep white matter both cerebral hemispheres most consistent with chronic small vessel ischemic disease, mild in nature. Patchy involvement of the pons. Remote lacunar infarct present at the subcortical white matter of the anterior left frontal lobe (series 6, image 21). Apparent diffusion abnormality at this location felt to be consistent with T2 shine through (series 2, image 41). Additional small remote lacunar infarct present at the left thalamus. No other foci of restricted diffusion to suggest acute or subacute ischemia. Gray-white matter differentiation otherwise maintained. No other areas of encephalomalacia to suggest chronic cortical infarction. No acute intracranial hemorrhage. Single punctate chronic microhemorrhage noted at the subcortical left parietal lobe, likely small vessel related. No mass lesion, midline shift or mass effect. No hydrocephalus or extra-axial fluid collection. Pituitary gland suprasellar region within normal limits. Midline structures intact and normal. Vascular: Major intracranial vascular flow voids are maintained. Skull and upper cervical spine: Craniocervical junction within normal limits. Bone marrow signal intensity normal. No scalp soft tissue abnormality. Sinuses/Orbits: Globes and orbital soft tissues within normal limits. Paranasal sinuses are largely clear. No mastoid effusion. Inner ear structures grossly normal. Other: None. IMPRESSION: 1. No acute intracranial infarct or other abnormality. 2. Underlying mild chronic microvascular ischemic disease, with small remote lacunar infarcts at the subcortical left frontal lobe and left thalamus. Electronically Signed   By: Rise Mu M.D.   On: 09/20/2020 22:18   US Aorta  Result Date: 09/20/2020 CLINICAL DATA:  Screen for AAA EXAM: ULTRASOUND OF ABDOMINAL AORTA TECHNIQUE: Ultrasound examination of the abdominal aorta and proximal common iliac arteries was  performed to evaluate for aneurysm. Additional color and Doppler images of the distal aorta were obtained to document patency. COMPARISON:  None. FINDINGS: Abdominal aortic measurements as follows: Proximal:  1.5 cm Mid:  1.3 cm Distal:  1.0 cm Patent: Yes, peak systolic velocity is 142 cm/s Right common iliac artery: 0.7 cm Left common iliac artery: 0.7 cm IMPRESSION: No evidence of abdominal aortic aneurysm. Electronically Signed   By: Charlett Nose M.D.   On: 09/20/2020 22:35    Procedures Procedures   Medications Ordered in ED Medications  sodium chloride 0.9 % bolus 500 mL (0 mLs Intravenous Stopped 09/20/20 2106)  iohexol (OMNIPAQUE) 350 MG/ML injection 80 mL (80 mLs Intravenous Contrast Given 09/20/20 1939)  sodium chloride 0.9 % bolus 500 mL (500 mLs Intravenous New Bag/Given 09/20/20 2106)    ED Course  I have reviewed the triage vital signs and the nursing notes.  Pertinent labs & imaging results that were available during my care of the patient were reviewed by me and considered in my medical decision making (see chart for details).    MDM Rules/Calculators/A&P                          55 year old male with heavy smoking history, nonobstructive  CAD, diabetes who is in poor health care at baseline comes in with 3 months of intermittent dizziness, ataxia and exertional limitations.  He has no chest pain, back pain.  His vascular exam is normal.  His neuro exam currently is nonfocal.  He has inferior EKG abnormalities.  Unfortunately we do not have an old EKG to compare, therefore it is unsure if this is an acute change or old change.  We ordered delta troponin to ensure there is no ACS.  Patient's HST and went from 30 to 25.  There were no acute ischemia findings, therefore I will request him to follow-up with cardiology service for further evaluation.  He certainly has risk factors for ACS given his heavy smoking and diabetes.  His vascular exam was reassuring where acute dissection is  deemed unlikely.  I will order AAA ultrasound, as that can sometimes present with dizziness and near fainting.  From neuro perspective, given the ataxia, dizziness and near fainting -CT angiogram of the head and neck ordered.  They were revealing some carotid webbing and proximal vertebral artery stenosis.  MRI was ordered and did not reveal any acute stroke, therefore we will now advised patient to follow-up with neurology service for his ataxia.  Smoking cessation requested.  Counseling provided.   11:55 PM I have reviewed the results of the MRI, ultrasound. The patient appears reasonably screened and/or stabilized for discharge and I doubt any other medical condition or other [facility] requiring further screening, evaluation, or treatment in the ED at this time prior to discharge.   Results from the ER workup discussed with the patient face to face and all questions answered to the best of my ability. The patient is safe for discharge with strict return precautions.  Stable for discharge with strict ER return precautions.  Final Clinical Impression(s) / ED Diagnoses Final diagnoses:  Ataxia  Dizziness  Abnormal ECG    Rx / DC Orders ED Discharge Orders         Ordered    aspirin 81 MG chewable tablet  Daily        09/20/20 2324           Derwood Kaplan, MD 09/20/20 2355

## 2020-09-20 NOTE — ED Provider Notes (Signed)
MSE was initiated and I personally evaluated the patient and placed orders (if any) at  3:26 PM on September 20, 2020.  The patient appears stable so that the remainder of the MSE may be completed by another provider.   Anselm Pancoast, PA-C 09/20/20 1526    Gerhard Munch, MD 09/21/20 [phone]

## 2020-09-20 NOTE — ED Triage Notes (Signed)
Pt from PCP office where he presented for one month of dizziness and chest pain, worse at night. Sent here for further eval of same and abnormal ekg.

## 2020-09-20 NOTE — ED Notes (Signed)
Pt taken to CT at this time.

## 2020-09-20 NOTE — Care Plan (Signed)
Discussed patient with Dr. Rhunette Croft in ED.  55 year old man with past medical history significant for coronary artery disease, diabetes (poorly controlled in 2012 with an A1c of greater than 14), dyslipidemia, ongoing tobacco use 2 packs/day, hypertension.  He is having worsening dizziness with exertion.  Discussed concern for potential cardiac cause as diabetics often do not have chest pain secondary to neuropathy.  CTA findings revealed bilateral carotid webs and left vertebral artery stenosis that is severe only at the origin, head CT with some chronic microvascular changes but no clear acute intracranial process  Recommend MRI brain without contrast, if positive for stroke would admit for stroke work-up if negative outpatient neurology follow-up in addition to outpatient cardiology follow-up is appropriate.  Additionally note that aspirin is not on his home medication list and given his significant coronary artery disease this would be indicated and would also reduce risk of stroke given his intracranial atherosclerosis and carotid webs.   Recommend starting 81 mg aspirin daily if there is no significant medical contraindication missed in my brief review.   Past Medical History:  Diagnosis Date  . Diabetes mellitus without complication [facility])    Current Outpatient Medications  Medication Instructions  . atorvastatin (LIPITOR) 40 mg, Oral, Daily  . Basaglar KwikPen 10 Units, Subcutaneous, Daily  . busPIRone (BUSPAR) 10 mg, Oral, 2 times daily  . carvedilol (COREG) 3.125 mg, Oral, 2 times daily  . Cymbalta 60 mg, Oral, Daily  . gabapentin (NEURONTIN) 300 mg, Oral, 3 times daily  . glipiZIDE (GLUCOTROL) 10 mg, Oral, 2 times daily  . lisinopril (ZESTRIL) 10 mg, Oral, Daily  . meclizine (ANTIVERT) 25 mg, Oral, 3 times daily PRN  . metFORMIN (GLUCOPHAGE) 500 mg, Oral, 2 times daily

## 2020-09-20 NOTE — Discharge Instructions (Signed)
  All the results in the ER are normal, labs and imaging. There is no evidence of stroke, abdominal aortic aneurysm.  No evidence of heart attack.  We are not sure what is causing your symptoms. The workup in the ER is not complete, and is limited to screening for life threatening and emergent conditions only, so please see the neurologist for your balance issues and the cardiologist for the abnormal EKG.  Please return to the ER if you start having sudden onset severe chest pain, or any sudden onset neurologic symptoms such as visual disturbance, slurred speech, one-sided weakness or numbness.

## 2020-11-28 NOTE — Progress Notes (Deleted)
Cardiology Office Note:    Date:  11/28/2020   ID:  [name], DOB 09-06-1965, MRN [id number]  PCP:  Pcp, No   [facility] Providers Cardiologist:  None {   Referring MD: Derwood Kaplan, MD    History of Present Illness:    [name] is a 55 y.o. male with a hx of DMII, nonobstructive CAD and tobacco use who was referred by Dr. Rhunette Croft for further evaluation of dizziness.  Patient was seen in the ER on 09/20/20, the patient was seen in the ER for dizziness and exertional SOB. ECG with inferior TWI. Trop negative x2. CTA head and neck with 50% stenosis of proximal right ICA and <50% in left ICA and severe left vertebral narrowing. He is now referred to Cardiology for further management.  Today,    Past Medical History:  Diagnosis Date   Ataxia    CAD (coronary artery disease)    Diabetes mellitus without complication (HCC)    Dizziness    Near syncope     *** The histories are not reviewed yet. Please review them in the "History" navigator section and refresh this SmartLink.  Current Medications: No outpatient medications have been marked as taking for the 12/10/20 encounter (Appointment) with Benjamin Sprague, MD.     Allergies:   Patient has no known allergies.   Social History   Socioeconomic History   Marital status: Married    Spouse name: Not on file   Number of children: Not on file   Years of education: Not on file   Highest education level: Not on file  Occupational History   Not on file  Tobacco Use   Smoking status: Not on file   Smokeless tobacco: Not on file  Substance and Sexual Activity   Alcohol use: Not on file   Drug use: Not on file   Sexual activity: Not on file  Other Topics Concern   Not on file  Social History Narrative   Not on file   Social Determinants of Health   Financial Resource Strain: Not on file  Food Insecurity: Not on file  Transportation Needs: Not on file  Physical Activity: Not on file   Stress: Not on file  Social Connections: Not on file     Family History: The patient's ***family history is not on file.  ROS:   Please see the history of present illness.    *** All other systems reviewed and are negative.  EKGs/Labs/Other Studies Reviewed:    The following studies were reviewed today: CT neck 09/20/20:  IMPRESSION: 1. No emergent large vessel occlusion or high-grade stenosis of the intracranial arteries. 2. Bilateral carotid webs with 50% stenosis of the proximal right ICA and less than 50% stenosis of the proximal left ICA. Both regions could serve as embolic sources. 3. 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  AAA Ultrasound 09/20/20: FINDINGS: Abdominal aortic measurements as follows:   Proximal:  1.5 cm   Mid:  1.3 cm   Distal:  1.0 cm Patent: Yes, peak systolic velocity is 142 cm/s   Right common iliac artery: 0.7 cm   Left common iliac artery: 0.7 cm   IMPRESSION: No evidence of abdominal aortic aneurysm.    EKG:  EKG is *** ordered today.  The ekg ordered today demonstrates ***  Recent Labs: 09/20/2020: BUN 20; Creatinine, Ser 0.88; Hemoglobin 15.1; Platelets 259; Potassium 4.8; Sodium 135  Recent Lipid Panel No results found for: CHOL, TRIG, HDL, CHOLHDL,  VLDL, LDLCALC, LDLDIRECT   Risk Assessment/Calculations:   {Does this patient have ATRIAL FIBRILLATION?:[phone]}   Physical Exam:    VS:  There were no vitals taken for this visit.    Wt Readings from Last 3 Encounters:  09/20/20 130 lb (59 kg)     GEN: *** Well nourished, well developed in no acute distress HEENT: Normal NECK: No JVD; No carotid bruits LYMPHATICS: No lymphadenopathy CARDIAC: ***RRR, no murmurs, rubs, gallops RESPIRATORY:  Clear to auscultation without rales, wheezing or rhonchi  ABDOMEN: Soft, non-tender, non-distended MUSCULOSKELETAL:  No edema; No deformity  SKIN: Warm and dry NEUROLOGIC:  Alert and oriented x 3 PSYCHIATRIC:  Normal  affect   ASSESSMENT:    No diagnosis found. PLAN:    In order of problems listed above:  #Abnormal ECG: #DOE: -Check myoview -Continue ASA 81mg  daily -Continue lipitor 40mg  daily  #HTN: -Continue lisinopril 10mg  daily -Continue coreg 3.125mg  BID  #DMII on Insulin: -Continue metformin 500mg  BID -Insulin per PCP  {Are you ordering a CV Procedure (e.g. stress test, cath, DCCV, TEE, etc)?   Press F2        :    Medication Adjustments/Labs and Tests Ordered: Current medicines are reviewed at length with the patient today.  Concerns regarding medicines are outlined above.  No orders of the defined types were placed in this encounter.  No orders of the defined types were placed in this encounter.   There are no Patient Instructions on file for this visit.   Signed, , MD  11/28/2020 3:00 PM    [facility]

## 2020-12-10 ENCOUNTER — Ambulatory Visit: Payer: Medicaid Other | Admitting: Cardiology

## 2020-12-10 ENCOUNTER — Other Ambulatory Visit: Payer: Self-pay

## 2020-12-10 ENCOUNTER — Encounter: Payer: Self-pay | Admitting: *Deleted

## 2020-12-10 ENCOUNTER — Encounter: Payer: Self-pay | Admitting: Cardiology

## 2020-12-10 VITALS — BP 110/60 | HR 86 | Ht 67.0 in | Wt 132.0 lb

## 2020-12-10 DIAGNOSIS — Z8249 Family history of ischemic heart disease and other diseases of the circulatory system: Secondary | ICD-10-CM

## 2020-12-10 DIAGNOSIS — E785 Hyperlipidemia, unspecified: Secondary | ICD-10-CM

## 2020-12-10 DIAGNOSIS — R079 Chest pain, unspecified: Secondary | ICD-10-CM | POA: Diagnosis not present

## 2020-12-10 DIAGNOSIS — I10 Essential (primary) hypertension: Secondary | ICD-10-CM

## 2020-12-10 DIAGNOSIS — E118 Type 2 diabetes mellitus with unspecified complications: Secondary | ICD-10-CM

## 2020-12-10 DIAGNOSIS — R9431 Abnormal electrocardiogram [ECG] [EKG]: Secondary | ICD-10-CM

## 2020-12-10 DIAGNOSIS — Z01818 Encounter for other preprocedural examination: Secondary | ICD-10-CM | POA: Diagnosis not present

## 2020-12-10 DIAGNOSIS — I25119 Atherosclerotic heart disease of native coronary artery with unspecified angina pectoris: Secondary | ICD-10-CM

## 2020-12-10 DIAGNOSIS — Z794 Long term (current) use of insulin: Secondary | ICD-10-CM

## 2020-12-10 DIAGNOSIS — Z01812 Encounter for preprocedural laboratory examination: Secondary | ICD-10-CM

## 2020-12-10 DIAGNOSIS — Z720 Tobacco use: Secondary | ICD-10-CM

## 2020-12-10 LAB — CBC
Hematocrit: 43.2 % (ref 37.5–51.0)
Hemoglobin: 14.6 g/dL (ref 13.0–17.7)
MCH: 29.9 pg (ref 26.6–33.0)
MCHC: 33.8 g/dL (ref 31.5–35.7)
MCV: 88 fL (ref 79–97)
Platelets: 248 10*3/uL (ref 150–450)
RBC: 4.89 x10E6/uL (ref 4.14–5.80)
RDW: 13.6 % (ref 11.6–15.4)
WBC: 7.7 10*3/uL (ref 3.4–10.8)

## 2020-12-10 LAB — BASIC METABOLIC PANEL
BUN/Creatinine Ratio: 21 — ABNORMAL HIGH (ref 9–20)
BUN: 15 mg/dL (ref 6–24)
CO2: 30 mmol/L — ABNORMAL HIGH (ref 20–29)
Calcium: 9.6 mg/dL (ref 8.7–10.2)
Chloride: 99 mmol/L (ref 96–106)
Creatinine, Ser: 0.71 mg/dL — ABNORMAL LOW (ref 0.76–1.27)
Glucose: 352 mg/dL — ABNORMAL HIGH (ref 65–99)
Potassium: 4.8 mmol/L (ref 3.5–5.2)
Sodium: 137 mmol/L (ref 134–144)
eGFR: 108 mL/min/{1.73_m2} (ref >59)

## 2020-12-10 MED ORDER — ROSUVASTATIN CALCIUM 20 MG PO TABS: 20.0000 mg | ORAL_TABLET | Freq: Every day | ORAL | 1 refills | Status: AC

## 2020-12-10 NOTE — Addendum Note (Signed)
Addended by: Laurance Flatten on: 12/10/2020 04:20 PM   Modules accepted: Level of Service

## 2020-12-10 NOTE — Progress Notes (Addendum)
Cardiology Office Note:    Date:  12/10/2020   ID:  [name], DOB [birth year], MRN [id number]  PCP:  Joice Lofts, NP   [facility] Providers Cardiologist:  None {   Referring MD: Derwood Kaplan, MD    History of Present Illness:    [name] is a 55 y.o. male with a hx of DMII, nonobstructive CAD and tobacco use who was referred by Dr. Rhunette Croft for further evaluation of dizziness.  Patient was seen in the ER on 09/20/20, the patient was seen in the ER for dizziness and exertional SOB. ECG with inferior TWI. Trop negative x2. CTA head and neck with 50% stenosis of proximal right ICA and <50% in left ICA and severe left vertebral narrowing. He is now referred to Cardiology for further management.  Today, he is accompanied by his wife, who also provides some history. The patient states he has been having intermittent chest pain that he describes as an ache. Also, he will feel correlating tightness in his chest or bilateral UE's as if he has been working out. Pain is not exertional or positional related. The other night he woke up with chest pain, and after taking 3 baby aspirin he felt some relief. Of note, he also reports having myalgias, constant headaches, and sensitivity to sunlight. If he looks upwards he becomes lightheaded and nauseous. He denies vomiting. Generally, he believes his symptoms are worsening. He is also scheduled to see Neurology for his dizziness.  Currently, he is a smoker. In his family, his father had a stent placement (25 yrs ago) and CABG.  He denies any shortness of breath, palpitations, exertional symptoms, or syncope. Also has no lower extremity edema, orthopnea or PND.   Past Medical History:  Diagnosis Date   Ataxia    CAD (coronary artery disease)    Diabetes mellitus without complication (HCC)    Dizziness    Near syncope       Current Medications: Current Meds  Medication Sig   aspirin 81 MG chewable tablet Chew 1 tablet (81  mg total) by mouth daily.   busPIRone (BUSPAR) 10 MG tablet Take 10 mg by mouth 2 (two) times daily.   carvedilol (COREG) 3.125 MG tablet Take 3.125 mg by mouth 2 (two) times daily.   CYMBALTA 60 MG capsule Take 60 mg by mouth daily.   gabapentin (NEURONTIN) 300 MG capsule Take 300 mg by mouth 3 (three) times daily.   glipiZIDE (GLUCOTROL) 10 MG tablet Take 10 mg by mouth 2 (two) times daily.   Insulin Glargine (BASAGLAR KWIKPEN) 100 UNIT/ML Inject 10 Units into the skin daily.   lisinopril (ZESTRIL) 10 MG tablet Take 10 mg by mouth daily.   meclizine (ANTIVERT) 25 MG tablet Take 25 mg by mouth 3 (three) times daily as needed for dizziness.   metFORMIN (GLUCOPHAGE) 1000 MG tablet Take 500 mg by mouth 2 (two) times daily.   omeprazole (PRILOSEC) 40 MG capsule Take 40 mg by mouth daily.   rOPINIRole (REQUIP) 0.25 MG tablet Take 0.25 mg by mouth at bedtime.   rosuvastatin (CRESTOR) 20 MG tablet Take 1 tablet (20 mg total) by mouth daily.   [DISCONTINUED] atorvastatin (LIPITOR) 20 MG tablet Take 20 mg by mouth daily.     Allergies:   Patient has no known allergies.   Social History   Socioeconomic History   Marital status: Married    Spouse name: Not on file   Number of children: Not on file  Years of education: Not on file   Highest education level: Not on file  Occupational History   Not on file  Tobacco Use   Smoking status: Every Day    Pack years: 0.00    Types: Cigarettes   Smokeless tobacco: Never  Substance and Sexual Activity   Alcohol use: Not on file   Drug use: Not on file   Sexual activity: Not on file  Other Topics Concern   Not on file  Social History Narrative   Not on file   Social Determinants of Health   Financial Resource Strain: Not on file  Food Insecurity: Not on file  Transportation Needs: Not on file  Physical Activity: Not on file  Stress: Not on file  Social Connections: Not on file     Family History: The patient's family history is not on  file.  ROS:   Review of Systems  Constitutional:  Negative for chills, diaphoresis and malaise/fatigue.  HENT:  Negative for congestion and ear pain.   Eyes:  Positive for photophobia. Negative for discharge.  Respiratory:  Negative for sputum production and wheezing.   Cardiovascular:  Positive for chest pain. Negative for palpitations, orthopnea, claudication, leg swelling and PND.  Gastrointestinal:  Positive for nausea. Negative for abdominal pain, blood in stool and diarrhea.  Genitourinary:  Negative for dysuria and hematuria.  Musculoskeletal:  Positive for myalgias. Negative for back pain and joint pain.  Neurological:  Positive for dizziness and headaches. Negative for loss of consciousness.  Endo/Heme/Allergies:  Does not bruise/bleed easily.  Psychiatric/Behavioral:  Negative for depression and hallucinations. The patient does not have insomnia.     EKGs/Labs/Other Studies Reviewed:    The following studies were reviewed today: CT neck 09/20/20:  IMPRESSION: 1. No emergent large vessel occlusion or high-grade stenosis of the intracranial arteries. 2. Bilateral carotid webs with 50% stenosis of the proximal right ICA and less than 50% stenosis of the proximal left ICA. Both regions could serve as embolic sources. 3. 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  AAA Ultrasound 09/20/20: FINDINGS: Abdominal aortic measurements as follows:   Proximal:  1.5 cm   Mid:  1.3 cm   Distal:  1.0 cm Patent: Yes, peak systolic velocity is 142 cm/s   Right common iliac artery: 0.7 cm   Left common iliac artery: 0.7 cm   IMPRESSION: No evidence of abdominal aortic aneurysm.    EKG:   12/10/2020: NSR with TWI in III aVF (similar to prior), HR 81   Recent Labs: 09/20/2020: BUN 20; Creatinine, Ser 0.88; Hemoglobin 15.1; Platelets 259; Potassium 4.8; Sodium 135  Recent Lipid Panel No results found for: CHOL, TRIG, HDL, CHOLHDL, VLDL, LDLCALC,  LDLDIRECT    Physical Exam:    VS:  BP 110/60   Pulse 86   Ht 5\' 7"  (1.702 m)   Wt 132 lb (59.9 kg)   SpO2 99%   BMI 20.67 kg/m     Wt Readings from Last 3 Encounters:  12/10/20 132 lb (59.9 kg)  09/20/20 130 lb (59 kg)     GEN: Thin male, comfortable HEENT: Normal NECK: No JVD; No carotid bruits CARDIAC: RRR, no murmurs, rubs, gallops RESPIRATORY:  Diminished throughout ABDOMEN: Soft, non-tender, non-distended MUSCULOSKELETAL:  No edema; No deformity  SKIN: Warm and dry NEUROLOGIC:  Alert and oriented x 3 PSYCHIATRIC:  Normal affect   ASSESSMENT:    1. Coronary artery disease involving native heart with angina pectoris, unspecified vessel or lesion type (HCC)  2. Pre-procedure lab exam   3. Chest pain of uncertain etiology   4. Hyperlipidemia, unspecified hyperlipidemia type   5. Nonspecific abnormal electrocardiogram (ECG) (EKG)   6. Primary hypertension   7. Type 2 diabetes mellitus with complication, with long-term current use of insulin (HCC)   8. Tobacco abuse   9. Family history of premature CAD    PLAN:    In order of problems listed above:  #Chest Pain: #Abnormal ECG: #DOE: #Family history of premature CAD: Patient with several month history of chest tightness that has been increasing in frequency. Pain is not exertional in nature but can come on at any time. While presentation is atypical, the patient has several risk factors for CAD including heavy tobacco use, known prior CAD per report, HTN, DMII, and family history of premature CAD. ECG also notably with TWI in inferior leads. Given symptoms and prior report of CAD, will proceed with coronary angiography. -Plan for cath -Check TTE -Continue ASA 81mg  daily -Change atorvastatin to crestor 20mg  daily due to muscle aches with atorvastatin  #HTN: Controlled and at goal <120s/80s. -Continue lisinopril 10mg  daily -Continue coreg 3.125mg  BID  #DMII on Insulin: -Continue metformin 500mg   BID -Insulin per PCP  #HLD: -Change lipitor to crestor 20mg  daily as above -Repeat lipids in 6 weeks  #Tobacco abuse: -Smoking cessation counseling discussed at length today  Shared Decision Making/Informed Consent The risks [stroke (1 in 1000), death (1 in 1000), kidney failure [usually temporary] (1 in 500), bleeding (1 in 200), allergic reaction [possibly serious] (1 in 200)], benefits (diagnostic support and management of coronary artery disease) and alternatives of a cardiac catheterization were discussed in detail with [name] and he is willing to proceed.  Follow-up in 3 months.  Medication Adjustments/Labs and Tests Ordered: Current medicines are reviewed at length with the patient today.  Concerns regarding medicines are outlined above.  Orders Placed This Encounter  Procedures   CBC   Basic metabolic panel   EKG 12-Lead   ECHOCARDIOGRAM COMPLETE   Meds ordered this encounter  Medications   rosuvastatin (CRESTOR) 20 MG tablet    Sig: Take 1 tablet (20 mg total) by mouth daily.    Dispense:  90 tablet    Refill:  1    Patient Instructions  Medication Instructions:   STOP TAKING ATORVASTATIN NOW  START TAKING ROSUVASTATIN (CRESTOR) 20 MG BY MOUTH DAILY  *If you need a refill on your cardiac medications before your next appointment, please call your pharmacy*   Lab Work:  [facility] LABS--BMET AND CBC  If you have labs (blood work) drawn today and your tests are completely normal, you will receive your results only by: MyChart Message (if you have MyChart) OR A paper copy in the mail If you have any lab test that is abnormal or we need to change your treatment, we will call you to review the results.   Testing/Procedures:  Your physician has requested that you have an echocardiogram. Echocardiography is a painless test that uses sound waves to create images of your heart. It provides your doctor with information about the size and shape of your  heart and how well your heart's chambers and valves are working. This procedure takes approximately one hour. There are no restrictions for this procedure.  Your physician has requested that you have a cardiac catheterization. Cardiac catheterization is used to diagnose and/or treat various heart conditions. Doctors may recommend this procedure for a number of different reasons. The most common reason  is to evaluate chest pain. Chest pain can be a symptom of coronary artery disease (CAD), and cardiac catheterization can show whether plaque is narrowing or blocking your heart's arteries. This procedure is also used to evaluate the valves, as well as measure the blood flow and oxygen levels in different parts of your heart. For further information please visit [email or web address] Please follow instruction sheet, as given.  CATH IS SCHEDULED FOR Monday December 24, 2020 AT 7 AM AT [facility] WITH DR. Clifton James TO DO--PLEASE FOLLOW CATH INSTRUCTIONS PROVIDED TO YOU TODAY TO PREPARE FOR THIS PROCEDURE   Follow-Up:  WILL BE ARRANGED AT DISCHARGE FROM YOUR CARDIAC CATH   Other Instructions      [facility] CARDIOVASCULAR DIVISION [facility] ST OFFICE [address] [place] [address] Dept: [phone] Loc: [phone]  Mihcael Ledee  12/10/2020  You are scheduled for a Cardiac Catheterization on Monday, July 11 with Dr. Verne Carrow.  1. Please arrive at the [facility] (Main Entrance A) at [facility]: [address] [place], [place] [address] at 7:00 AM (This time is two hours before your procedure to ensure your preparation). Free valet parking service is available.   Special note: Every effort is made to have your procedure done on time. Please understand that emergencies sometimes delay scheduled procedures.  2. Diet: Do not eat solid foods after midnight.  The patient may have clear liquids until 5am upon  the day of the procedure.  3. Labs: You will need to have blood drawn on Monday, June 27 at [facility] at [facility]. [address], [place]  Open: 7:30am - 5pm    Phone: [phone]. You do not need to be fasting.  4. Medication instructions in preparation for your procedure:   Contrast Allergy: No  Stop taking, Lisinopril (Zestril or Prinivil) Sunday, July 10,  Take only 5 units of insulin the night before your procedure. Do not take any insulin on the day of the procedure.  Do not take Diabetes Med Glucophage (Metformin) on the day of the procedure and HOLD 48 HOURS AFTER THE PROCEDURE.  PLEASE HOLD YOUR GLIPIZIDE AS WELL THE MORNING OF YOUR CATH  On the morning of your procedure, take your Aspirin and any morning medicines NOT listed above.  You may use sips of water.  5. Plan for one night stay--bring personal belongings. 6. Bring a current list of your medications and current insurance cards. 7. You MUST have a responsible person to drive you home. 8. Someone MUST be with you the first 24 hours after you arrive home or your discharge will be delayed. 9. Please wear clothes that are easy to get on and off and wear slip-on shoes.  Thank you for allowing [place] to care for you!   -- [facility]             I,Mathew Stumpf,acting as a scribe for Meriam Sprague, MD.,have documented all relevant documentation on the behalf of Meriam Sprague, MD,as directed by  Meriam Sprague, MD while in the presence of Meriam Sprague, MD.  I, Meriam Sprague, MD, have reviewed all documentation for this visit. The documentation on 12/10/20 for the exam, diagnosis, procedures, and orders are all accurate and complete.   Signed, Meriam Sprague, MD  12/10/2020 4:19 PM    [facility]

## 2020-12-10 NOTE — H&P (View-Only) (Signed)
Cardiology Office Note:    Date:  12/10/2020   ID:  [name], DOB [birth year], MRN [id number]  PCP:  Joice Lofts, NP   [facility] Providers Cardiologist:  None {   Referring MD: Derwood Kaplan, MD    History of Present Illness:    [name] is a 55 y.o. male with a hx of DMII, nonobstructive CAD and tobacco use who was referred by Dr. Rhunette Croft for further evaluation of dizziness.  Patient was seen in the ER on 09/20/20, the patient was seen in the ER for dizziness and exertional SOB. ECG with inferior TWI. Trop negative x2. CTA head and neck with 50% stenosis of proximal right ICA and <50% in left ICA and severe left vertebral narrowing. He is now referred to Cardiology for further management.  Today, he is accompanied by his wife, who also provides some history. The patient states he has been having intermittent chest pain that he describes as an ache. Also, he will feel correlating tightness in his chest or bilateral UE's as if he has been working out. Pain is not exertional or positional related. The other night he woke up with chest pain, and after taking 3 baby aspirin he felt some relief. Of note, he also reports having myalgias, constant headaches, and sensitivity to sunlight. If he looks upwards he becomes lightheaded and nauseous. He denies vomiting. Generally, he believes his symptoms are worsening. He is also scheduled to see Neurology for his dizziness.  Currently, he is a smoker. In his family, his father had a stent placement (25 yrs ago) and CABG.  He denies any shortness of breath, palpitations, exertional symptoms, or syncope. Also has no lower extremity edema, orthopnea or PND.   Past Medical History:  Diagnosis Date   Ataxia    CAD (coronary artery disease)    Diabetes mellitus without complication (HCC)    Dizziness    Near syncope       Current Medications: Current Meds  Medication Sig   aspirin 81 MG chewable tablet Chew 1 tablet (81  mg total) by mouth daily.   busPIRone (BUSPAR) 10 MG tablet Take 10 mg by mouth 2 (two) times daily.   carvedilol (COREG) 3.125 MG tablet Take 3.125 mg by mouth 2 (two) times daily.   CYMBALTA 60 MG capsule Take 60 mg by mouth daily.   gabapentin (NEURONTIN) 300 MG capsule Take 300 mg by mouth 3 (three) times daily.   glipiZIDE (GLUCOTROL) 10 MG tablet Take 10 mg by mouth 2 (two) times daily.   Insulin Glargine (BASAGLAR KWIKPEN) 100 UNIT/ML Inject 10 Units into the skin daily.   lisinopril (ZESTRIL) 10 MG tablet Take 10 mg by mouth daily.   meclizine (ANTIVERT) 25 MG tablet Take 25 mg by mouth 3 (three) times daily as needed for dizziness.   metFORMIN (GLUCOPHAGE) 1000 MG tablet Take 500 mg by mouth 2 (two) times daily.   omeprazole (PRILOSEC) 40 MG capsule Take 40 mg by mouth daily.   rOPINIRole (REQUIP) 0.25 MG tablet Take 0.25 mg by mouth at bedtime.   rosuvastatin (CRESTOR) 20 MG tablet Take 1 tablet (20 mg total) by mouth daily.   [DISCONTINUED] atorvastatin (LIPITOR) 20 MG tablet Take 20 mg by mouth daily.     Allergies:   Patient has no known allergies.   Social History   Socioeconomic History   Marital status: Married    Spouse name: Not on file   Number of children: Not on file  Years of education: Not on file   Highest education level: Not on file  Occupational History   Not on file  Tobacco Use   Smoking status: Every Day    Pack years: 0.00    Types: Cigarettes   Smokeless tobacco: Never  Substance and Sexual Activity   Alcohol use: Not on file   Drug use: Not on file   Sexual activity: Not on file  Other Topics Concern   Not on file  Social History Narrative   Not on file   Social Determinants of Health   Financial Resource Strain: Not on file  Food Insecurity: Not on file  Transportation Needs: Not on file  Physical Activity: Not on file  Stress: Not on file  Social Connections: Not on file     Family History: The patient's family history is not on  file.  ROS:   Review of Systems  Constitutional:  Negative for chills, diaphoresis and malaise/fatigue.  HENT:  Negative for congestion and ear pain.   Eyes:  Positive for photophobia. Negative for discharge.  Respiratory:  Negative for sputum production and wheezing.   Cardiovascular:  Positive for chest pain. Negative for palpitations, orthopnea, claudication, leg swelling and PND.  Gastrointestinal:  Positive for nausea. Negative for abdominal pain, blood in stool and diarrhea.  Genitourinary:  Negative for dysuria and hematuria.  Musculoskeletal:  Positive for myalgias. Negative for back pain and joint pain.  Neurological:  Positive for dizziness and headaches. Negative for loss of consciousness.  Endo/Heme/Allergies:  Does not bruise/bleed easily.  Psychiatric/Behavioral:  Negative for depression and hallucinations. The patient does not have insomnia.     EKGs/Labs/Other Studies Reviewed:    The following studies were reviewed today: CT neck 09/20/20:  IMPRESSION: 1. No emergent large vessel occlusion or high-grade stenosis of the intracranial arteries. 2. Bilateral carotid webs with 50% stenosis of the proximal right ICA and less than 50% stenosis of the proximal left ICA. Both regions could serve as embolic sources. 3. 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  AAA Ultrasound 09/20/20: FINDINGS: Abdominal aortic measurements as follows:   Proximal:  1.5 cm   Mid:  1.3 cm   Distal:  1.0 cm Patent: Yes, peak systolic velocity is 142 cm/s   Right common iliac artery: 0.7 cm   Left common iliac artery: 0.7 cm   IMPRESSION: No evidence of abdominal aortic aneurysm.    EKG:   12/10/2020: NSR with TWI in III aVF (similar to prior), HR 81   Recent Labs: 09/20/2020: BUN 20; Creatinine, Ser 0.88; Hemoglobin 15.1; Platelets 259; Potassium 4.8; Sodium 135  Recent Lipid Panel No results found for: CHOL, TRIG, HDL, CHOLHDL, VLDL, LDLCALC,  LDLDIRECT    Physical Exam:    VS:  BP 110/60   Pulse 86   Ht 5' 7" (1.702 m)   Wt 132 lb (59.9 kg)   SpO2 99%   BMI 20.67 kg/m     Wt Readings from Last 3 Encounters:  12/10/20 132 lb (59.9 kg)  09/20/20 130 lb (59 kg)     GEN: Thin male, comfortable HEENT: Normal NECK: No JVD; No carotid bruits CARDIAC: RRR, no murmurs, rubs, gallops RESPIRATORY:  Diminished throughout ABDOMEN: Soft, non-tender, non-distended MUSCULOSKELETAL:  No edema; No deformity  SKIN: Warm and dry NEUROLOGIC:  Alert and oriented x 3 PSYCHIATRIC:  Normal affect   ASSESSMENT:    1. Coronary artery disease involving native heart with angina pectoris, unspecified vessel or lesion type (HCC)     2. Pre-procedure lab exam   3. Chest pain of uncertain etiology   4. Hyperlipidemia, unspecified hyperlipidemia type   5. Nonspecific abnormal electrocardiogram (ECG) (EKG)   6. Primary hypertension   7. Type 2 diabetes mellitus with complication, with long-term current use of insulin (HCC)   8. Tobacco abuse   9. Family history of premature CAD    PLAN:    In order of problems listed above:  #Chest Pain: #Abnormal ECG: #DOE: #Family history of premature CAD: Patient with several month history of chest tightness that has been increasing in frequency. Pain is not exertional in nature but can come on at any time. While presentation is atypical, the patient has several risk factors for CAD including heavy tobacco use, known prior CAD per report, HTN, DMII, and family history of premature CAD. ECG also notably with TWI in inferior leads. Given symptoms and prior report of CAD, will proceed with coronary angiography. -Plan for cath -Check TTE -Continue ASA 81mg  daily -Change atorvastatin to crestor 20mg  daily due to muscle aches with atorvastatin  #HTN: Controlled and at goal <120s/80s. -Continue lisinopril 10mg  daily -Continue coreg 3.125mg  BID  #DMII on Insulin: -Continue metformin 500mg   BID -Insulin per PCP  #HLD: -Change lipitor to crestor 20mg  daily as above -Repeat lipids in 6 weeks  #Tobacco abuse: -Smoking cessation counseling discussed at length today  Shared Decision Making/Informed Consent The risks [stroke (1 in 1000), death (1 in 1000), kidney failure [usually temporary] (1 in 500), bleeding (1 in 200), allergic reaction [possibly serious] (1 in 200)], benefits (diagnostic support and management of coronary artery disease) and alternatives of a cardiac catheterization were discussed in detail with Mr. Blanton and he is willing to proceed.  Follow-up in 3 months.  Medication Adjustments/Labs and Tests Ordered: Current medicines are reviewed at length with the patient today.  Concerns regarding medicines are outlined above.  Orders Placed This Encounter  Procedures   CBC   Basic metabolic panel   EKG 12-Lead   ECHOCARDIOGRAM COMPLETE   Meds ordered this encounter  Medications   rosuvastatin (CRESTOR) 20 MG tablet    Sig: Take 1 tablet (20 mg total) by mouth daily.    Dispense:  90 tablet    Refill:  1    Patient Instructions  Medication Instructions:   STOP TAKING ATORVASTATIN NOW  START TAKING ROSUVASTATIN (CRESTOR) 20 MG BY MOUTH DAILY  *If you need a refill on your cardiac medications before your next appointment, please call your pharmacy*   Lab Work:  [facility] LABS--BMET AND CBC  If you have labs (blood work) drawn today and your tests are completely normal, you will receive your results only by: MyChart Message (if you have MyChart) OR A paper copy in the mail If you have any lab test that is abnormal or we need to change your treatment, we will call you to review the results.   Testing/Procedures:  Your physician has requested that you have an echocardiogram. Echocardiography is a painless test that uses sound waves to create images of your heart. It provides your doctor with information about the size and shape of your  heart and how well your heart's chambers and valves are working. This procedure takes approximately one hour. There are no restrictions for this procedure.  Your physician has requested that you have a cardiac catheterization. Cardiac catheterization is used to diagnose and/or treat various heart conditions. Doctors may recommend this procedure for a number of different reasons. The most common reason  is to evaluate chest pain. Chest pain can be a symptom of coronary artery disease (CAD), and cardiac catheterization can show whether plaque is narrowing or blocking your heart's arteries. This procedure is also used to evaluate the valves, as well as measure the blood flow and oxygen levels in different parts of your heart. For further information please visit [email or web address] Please follow instruction sheet, as given.  CATH IS SCHEDULED FOR Monday December 24, 2020 AT 7 AM AT [facility] WITH DR. Clifton James TO DO--PLEASE FOLLOW CATH INSTRUCTIONS PROVIDED TO YOU TODAY TO PREPARE FOR THIS PROCEDURE   Follow-Up:  WILL BE ARRANGED AT DISCHARGE FROM YOUR CARDIAC CATH   Other Instructions      [facility] CARDIOVASCULAR DIVISION [facility] ST OFFICE [address] [place] [address] Dept: [phone] Loc: [phone]  Mihcael Ledee  12/10/2020  You are scheduled for a Cardiac Catheterization on Monday, July 11 with Dr. Verne Carrow.  1. Please arrive at the [facility] (Main Entrance A) at [facility]: [address] [place], [place] [address] at 7:00 AM (This time is two hours before your procedure to ensure your preparation). Free valet parking service is available.   Special note: Every effort is made to have your procedure done on time. Please understand that emergencies sometimes delay scheduled procedures.  2. Diet: Do not eat solid foods after midnight.  The patient may have clear liquids until 5am upon  the day of the procedure.  3. Labs: You will need to have blood drawn on Monday, June 27 at [facility] at [facility]. [address], [place]  Open: 7:30am - 5pm    Phone: [phone]. You do not need to be fasting.  4. Medication instructions in preparation for your procedure:   Contrast Allergy: No  Stop taking, Lisinopril (Zestril or Prinivil) Sunday, July 10,  Take only 5 units of insulin the night before your procedure. Do not take any insulin on the day of the procedure.  Do not take Diabetes Med Glucophage (Metformin) on the day of the procedure and HOLD 48 HOURS AFTER THE PROCEDURE.  PLEASE HOLD YOUR GLIPIZIDE AS WELL THE MORNING OF YOUR CATH  On the morning of your procedure, take your Aspirin and any morning medicines NOT listed above.  You may use sips of water.  5. Plan for one night stay--bring personal belongings. 6. Bring a current list of your medications and current insurance cards. 7. You MUST have a responsible person to drive you home. 8. Someone MUST be with you the first 24 hours after you arrive home or your discharge will be delayed. 9. Please wear clothes that are easy to get on and off and wear slip-on shoes.  Thank you for allowing [place] to care for you!   -- [place] [facility]             I,Mathew Stumpf,acting as a scribe for Meriam Sprague, MD.,have documented all relevant documentation on the behalf of Meriam Sprague, MD,as directed by  Meriam Sprague, MD while in the presence of Meriam Sprague, MD.  I, Meriam Sprague, MD, have reviewed all documentation for this visit. The documentation on 12/10/20 for the exam, diagnosis, procedures, and orders are all accurate and complete.   Signed, Meriam Sprague, MD  12/10/2020 4:19 PM    [facility]

## 2020-12-10 NOTE — Patient Instructions (Addendum)
Medication Instructions:   STOP TAKING ATORVASTATIN NOW  START TAKING ROSUVASTATIN (CRESTOR) 20 MG BY MOUTH DAILY  *If you need a refill on your cardiac medications before your next appointment, please call your pharmacy*   Lab Work:  [facility] LABS--BMET AND CBC  If you have labs (blood work) drawn today and your tests are completely normal, you will receive your results only by: MyChart Message (if you have MyChart) OR A paper copy in the mail If you have any lab test that is abnormal or we need to change your treatment, we will call you to review the results.   Testing/Procedures:  Your physician has requested that you have an echocardiogram. Echocardiography is a painless test that uses sound waves to create images of your heart. It provides your doctor with information about the size and shape of your heart and how well your heart's chambers and valves are working. This procedure takes approximately one hour. There are no restrictions for this procedure.  Your physician has requested that you have a cardiac catheterization. Cardiac catheterization is used to diagnose and/or treat various heart conditions. Doctors may recommend this procedure for a number of different reasons. The most common reason is to evaluate chest pain. Chest pain can be a symptom of coronary artery disease (CAD), and cardiac catheterization can show whether plaque is narrowing or blocking your heart's arteries. This procedure is also used to evaluate the valves, as well as measure the blood flow and oxygen levels in different parts of your heart. For further information please visit [email or web address] Please follow instruction sheet, as given.  CATH IS SCHEDULED FOR Monday December 24, 2020 AT 7 AM AT [facility] WITH DR. Clifton James TO DO--PLEASE FOLLOW CATH INSTRUCTIONS PROVIDED TO YOU TODAY TO PREPARE FOR THIS PROCEDURE   Follow-Up:  WILL BE ARRANGED AT DISCHARGE FROM YOUR CARDIAC  CATH   Other Instructions      [facility] CARDIOVASCULAR DIVISION [facility] ST OFFICE [address] [place] [address] Dept: [phone] Loc: [phone]  Galvin Aversa  12/10/2020  You are scheduled for a Cardiac Catheterization on Monday, July 11 with Dr. Verne Carrow.  1. Please arrive at the [facility] (Main Entrance A) at [facility]: [address] [place], [place] [address] at 7:00 AM (This time is two hours before your procedure to ensure your preparation). Free valet parking service is available.   Special note: Every effort is made to have your procedure done on time. Please understand that emergencies sometimes delay scheduled procedures.  2. Diet: Do not eat solid foods after midnight.  The patient may have clear liquids until 5am upon the day of the procedure.  3. Labs: You will need to have blood drawn on Monday, June 27 at [facility] at [facility]. [address], [place]  Open: 7:30am - 5pm    Phone: [phone]. You do not need to be fasting.  4. Medication instructions in preparation for your procedure:   Contrast Allergy: No  Stop taking, Lisinopril (Zestril or Prinivil) Sunday, July 10,  Take only 5 units of insulin the night before your procedure. Do not take any insulin on the day of the procedure.  Do not take Diabetes Med Glucophage (Metformin) on the day of the procedure and HOLD 48 HOURS AFTER THE PROCEDURE.  PLEASE HOLD YOUR GLIPIZIDE AS WELL THE MORNING OF YOUR CATH  On the morning of your procedure, take your  Aspirin and any morning medicines NOT listed above.  You may use sips of water.  5. Plan for one night stay--bring personal belongings. 6. Bring a current list of your medications and current insurance cards. 7. You MUST have a responsible person to drive you home. 8. Someone MUST be with you the first 24 hours after you arrive home or your  discharge will be delayed. 9. Please wear clothes that are easy to get on and off and wear slip-on shoes.  Thank you for allowing [place] to care for you!   -- [place] [facility]

## 2020-12-11 NOTE — Addendum Note (Signed)
Addended by: Aragorn Recker on: 12/11/2020 09:35 AM   Modules accepted: Level of Service  

## 2020-12-13 ENCOUNTER — Encounter: Payer: Self-pay | Admitting: *Deleted

## 2020-12-19 ENCOUNTER — Ambulatory Visit: Payer: Medicaid Other | Admitting: Diagnostic Neuroimaging

## 2020-12-19 ENCOUNTER — Encounter: Payer: Self-pay | Admitting: Diagnostic Neuroimaging

## 2020-12-19 VITALS — BP 122/78 | HR 78 | Ht 67.0 in | Wt 133.0 lb

## 2020-12-19 DIAGNOSIS — G43109 Migraine with aura, not intractable, without status migrainosus: Secondary | ICD-10-CM | POA: Diagnosis not present

## 2020-12-19 DIAGNOSIS — Z794 Long term (current) use of insulin: Secondary | ICD-10-CM

## 2020-12-19 DIAGNOSIS — R42 Dizziness and giddiness: Secondary | ICD-10-CM | POA: Diagnosis not present

## 2020-12-19 DIAGNOSIS — E1149 Type 2 diabetes mellitus with other diabetic neurological complication: Secondary | ICD-10-CM

## 2020-12-19 MED ORDER — TOPIRAMATE 50 MG PO TABS: 50.0000 mg | ORAL_TABLET | Freq: Two times a day (BID) | ORAL | 12 refills | Status: AC

## 2020-12-19 NOTE — Patient Instructions (Signed)
-   topiramate 50mg  at bedtime; after 1-2 weeks increase to twice a day; drink plenty of water

## 2020-12-19 NOTE — Progress Notes (Signed)
[facility]  PATIENT: [name] DOB: 02/14/1966  REFERRING CLINICIAN: Varney Biles, MD HISTORY FROM: patient  REASON FOR VISIT: new consult    HISTORICAL  CHIEF COMPLAINT:  Chief Complaint  Patient presents with   Dizziness    RM 6 with spouse sarah Pt states he keeps having dizzy spells, nauseous, headaches and vision complications which has caused a a few falls.    HISTORY OF PRESENT ILLNESS:   55 year old male here for evaluation of dizzy spells.  Starting in the beginning of 2022 patient has had onset of intermittent dizzy spells when he looks up, stands and walks, moves.  Sometimes he has nausea, headache, jaw pain and jaw claudication.  Sometimes has lightheadedness and near syncope.  History of tobacco abuse, coronary artery disease, diabetes.  Patient does have history of headaches with migraine features since he was 55 years old.   REVIEW OF SYSTEMS: Full 14 system review of systems performed and negative with exception of: as per HPI.  ALLERGIES: No Known Allergies  HOME MEDICATIONS: Outpatient Medications Prior to Visit  Medication Sig Dispense Refill   aspirin 81 MG chewable tablet Chew 1 tablet (81 mg total) by mouth daily. (Patient taking differently: Chew 81 mg by mouth in the morning.) 30 tablet 1   busPIRone (BUSPAR) 10 MG tablet Take 10 mg by mouth 2 (two) times daily.     carvedilol (COREG) 3.125 MG tablet Take 3.125 mg by mouth 2 (two) times daily.     CYMBALTA 60 MG capsule Take 60 mg by mouth in the morning.     diphenhydrAMINE (BENADRYL) 25 MG tablet Take 25 mg by mouth in the morning.     gabapentin (NEURONTIN) 300 MG capsule Take 300 mg by mouth 3 (three) times daily.     glipiZIDE (GLUCOTROL) 10 MG tablet Take 10 mg by mouth 2 (two) times daily.     Insulin Glargine (BASAGLAR KWIKPEN) 100 UNIT/ML Inject 10 Units into the skin in the morning.     lisinopril (ZESTRIL) 10 MG tablet Take 10 mg by mouth in the morning.      metFORMIN (GLUCOPHAGE) 1000 MG tablet Take 500 mg by mouth 2 (two) times daily.     omeprazole (PRILOSEC) 40 MG capsule Take 40 mg by mouth in the morning.     rOPINIRole (REQUIP) 0.25 MG tablet Take 0.25 mg by mouth at bedtime.     rosuvastatin (CRESTOR) 20 MG tablet Take 1 tablet (20 mg total) by mouth daily. (Patient taking differently: Take 20 mg by mouth in the morning.) 90 tablet 1   Vitamin D, Ergocalciferol, (DRISDOL) 1.25 MG (50000 UNIT) CAPS capsule Take 50,000 Units by mouth every Friday.     No facility-administered medications prior to visit.    PAST MEDICAL HISTORY: Past Medical History:  Diagnosis Date   Ataxia    CAD (coronary artery disease)    Diabetes mellitus without complication ([place])    Dizziness    Near syncope     PAST SURGICAL HISTORY: Past Surgical History:  Procedure Laterality Date   CARDIAC CATHETERIZATION      FAMILY HISTORY: History reviewed. No pertinent family history.  SOCIAL HISTORY: Social History   Socioeconomic History   Marital status: Married    Spouse name: Not on file   Number of children: Not on file   Years of education: Not on file   Highest education level: Not on file  Occupational History   Not on file  Tobacco Use  Smoking status: Every Day    Pack years: 0.00    Types: Cigarettes   Smokeless tobacco: Never  Substance and Sexual Activity   Alcohol use: Not on file   Drug use: Not on file   Sexual activity: Not on file  Other Topics Concern   Not on file  Social History Narrative   Lives with wife   2-3 cps of caffeine    Social Determinants of Health   Financial Resource Strain: Not on file  Food Insecurity: Not on file  Transportation Needs: Not on file  Physical Activity: Not on file  Stress: Not on file  Social Connections: Not on file  Intimate Partner Violence: Not on file     PHYSICAL EXAM  GENERAL EXAM/CONSTITUTIONAL: Vitals:  Vitals:   12/19/20 0923  BP: 122/78  Pulse: 78  Weight:  133 lb (60.3 kg)  Height: '5\' 7"'  (1.702 m)   Body mass index is 20.83 kg/m. Wt Readings from Last 3 Encounters:  12/19/20 133 lb (60.3 kg)  12/10/20 132 lb (59.9 kg)  09/20/20 130 lb (59 kg)   Patient is in no distress; well developed, nourished and groomed; neck is supple  CARDIOVASCULAR: Examination of carotid arteries is NOTABLE FOR LEFT CAROTID BRUIT Regular rate and rhythm, no murmurs Examination of peripheral vascular system by observation and palpation is NOTABLE FOR DECR BILATERAL RADIAL PULSES DECR BILATERAL TEMPORAL ARTERY PULSES; NO TENDERNESS  EYES: Ophthalmoscopic exam of optic discs and posterior segments is normal; no papilledema or hemorrhages No results found.  MUSCULOSKELETAL: Gait, strength, tone, movements noted in Neurologic exam below  NEUROLOGIC: MENTAL STATUS:  No flowsheet data found. awake, alert, oriented to person, place and time recent and remote memory intact normal attention and concentration language fluent, comprehension intact, naming intact fund of knowledge appropriate  CRANIAL NERVE:  2nd - no papilledema on fundoscopic exam 2nd, 3rd, 4th, 6th - pupils equal and reactive to light, visual fields full to confrontation, extraocular muscles intact, no nystagmus 5th - facial sensation symmetric 7th - facial strength symmetric 8th - hearing intact 9th - palate elevates symmetrically, uvula midline 11th - shoulder shrug symmetric 12th - tongue protrusion midline  MOTOR:  normal bulk and tone, full strength in the BUE, BLE; EXCEPT DECR LEFT FOOT DF (4)  SENSORY:  normal and symmetric to light touch, temperature, vibration; EXCEPT DECR IN FEET  COORDINATION:  finger-nose-finger, fine finger movements normal  REFLEXES:  deep tendon reflexes TRACE and symmetric  GAIT/STATION:  narrow based gait     DIAGNOSTIC DATA (LABS, IMAGING, TESTING) - I reviewed patient records, labs, notes, testing and imaging myself where  available.  Lab Results  Component Value Date   WBC 7.7 12/10/2020   HGB 14.6 12/10/2020   HCT 43.2 12/10/2020   MCV 88 12/10/2020   PLT 248 12/10/2020      Component Value Date/Time   NA 137 12/10/2020 1537   K 4.8 12/10/2020 1537   CL 99 12/10/2020 1537   CO2 30 (H) 12/10/2020 1537   GLUCOSE 352 (H) 12/10/2020 1537   GLUCOSE 206 (H) 09/20/2020 1700   BUN 15 12/10/2020 1537   CREATININE 0.71 (L) 12/10/2020 1537   CALCIUM 9.6 12/10/2020 1537   GFRNONAA >60 09/20/2020 1700   GFRAA  09/08/2008 1152    >60        The eGFR has been calculated using the MDRD equation. This calculation has not been validated in all clinical situations. eGFR's persistently <60 mL/min signify possible Chronic  Kidney Disease.   No results found for: CHOL, HDL, LDLCALC, LDLDIRECT, TRIG, CHOLHDL No results found for: HGBA1C No results found for: VITAMINB12 No results found for: TSH   09/20/20 CTA head / neck 1. No emergent large vessel occlusion or high-grade stenosis of the intracranial arteries. 2. Bilateral carotid webs with 50% stenosis of the proximal right ICA and less than 50% stenosis of the  roximal left ICA. Both regions could serve as embolic sources. 3. 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  09/20/20 MRI brain 1. No acute intracranial infarct or other abnormality. 2. Underlying mild chronic microvascular ischemic disease, with small remote lacunar infarcts at the  subcortical left frontal lobe and left thalamus.   HbgA1c - ~11 (in June 2022 per patient)    ASSESSMENT AND PLAN  55 y.o. year old male here with:  Dx:  1. Migraine with aura and without status migrainosus, not intractable   2. Type 2 diabetes mellitus with other neurologic complication, with long-term current use of insulin ([place])   3. Dizziness      PLAN:  DIZZINESS, HEADACHES, PHOTOPHOBIA, NAUSEA (ddx: migraine, carotid / vertebral stenosis, temporal arteritis, diabetic neuropathy,  cardiogenic) - check ESR, CRP - trial of topiramate (for migraine prevention; no triptans due to CAD risk / eval) - continue BP, DM, lipid control - continue aspirin 15m daily   Orders Placed This Encounter  Procedures   Hemoglobin A1c   C-reactive Protein   Sedimentation Rate    Meds ordered this encounter  Medications   topiramate (TOPAMAX) 50 MG tablet    Sig: Take 1 tablet (50 mg total) by mouth 2 (two) times daily.    Dispense:  60 tablet    Refill:  12    Return in about 6 months (around 06/21/2021) for with NP (Amy Lomax).    VPenni Bombard MD 72/02/474 133:91AM Certified in Neurology, Neurophysiology and Neuroimaging  [facility] [address] [place] [phone]

## 2020-12-20 ENCOUNTER — Telehealth: Payer: Self-pay

## 2020-12-20 ENCOUNTER — Telehealth: Payer: Self-pay | Admitting: *Deleted

## 2020-12-20 LAB — C-REACTIVE PROTEIN: CRP: 2 mg/L (ref 0–10)

## 2020-12-20 LAB — SEDIMENTATION RATE: Sed Rate: 10 mm/hr (ref 0–30)

## 2020-12-20 NOTE — Telephone Encounter (Signed)
Pt contacted pre-catheterization scheduled at [facility] for: Monday December 24, 2020 9 AM Verified arrival time and place: [facility] Main Entrance A [facility]) at: 7 AM   No solid food after midnight prior to cath, clear liquids until 5 AM day of procedure.  Hold: Insulin-AM of procedure Glipizide- AM of procedure Metformin-day of procedure and 48 hours post procedure   Except hold medications AM meds can be  taken pre-cath with sips of water including: aspirin 81 mg   Confirmed patient has responsible adult to drive home post procedure and be with patient first 24 hours after arriving home: yes  You are allowed ONE visitor in the waiting room during the time you are at the hospital for your procedure. Both you and your visitor must wear a mask once you enter the hospital.   Patient reports does not currently have any symptoms concerning for COVID-19 and no household members with COVID-19 like illness.       Reviewed procedure/mask/visitor instructions with patient.

## 2020-12-20 NOTE — Telephone Encounter (Signed)
I called the pt and advised of lab results. He verbalized understanding and had no questions/concerns,

## 2020-12-20 NOTE — Telephone Encounter (Signed)
-----   Message from Suanne Marker, MD sent at 12/20/2020  2:31 PM EDT ----- Normal labs. Please call patient. -VRP

## 2020-12-24 ENCOUNTER — Other Ambulatory Visit: Payer: Self-pay

## 2020-12-24 ENCOUNTER — Inpatient Hospital Stay (HOSPITAL_COMMUNITY)
Admission: RE | Disposition: A | Discharge status: Home / Self Care | Payer: Self-pay | Source: Home / Self Care | Attending: Thoracic Surgery (Cardiothoracic Vascular Surgery)

## 2020-12-24 ENCOUNTER — Inpatient Hospital Stay (HOSPITAL_COMMUNITY): Payer: Medicaid Other

## 2020-12-24 ENCOUNTER — Encounter (HOSPITAL_COMMUNITY): Payer: Self-pay | Admitting: Cardiovascular Disease

## 2020-12-24 ENCOUNTER — Inpatient Hospital Stay (HOSPITAL_COMMUNITY)
Admission: RE | Admit: 2020-12-24 | Discharge: 2021-01-05 | DRG: 234 | Disposition: A | Discharge status: Home / Self Care | Payer: Medicaid Other | Attending: Thoracic Surgery (Cardiothoracic Vascular Surgery) | Admitting: Thoracic Surgery (Cardiothoracic Vascular Surgery)

## 2020-12-24 DIAGNOSIS — I25110 Atherosclerotic heart disease of native coronary artery with unstable angina pectoris: Secondary | ICD-10-CM | POA: Diagnosis not present

## 2020-12-24 DIAGNOSIS — Z91120 Patient's intentional underdosing of medication regimen due to financial hardship: Secondary | ICD-10-CM | POA: Diagnosis not present

## 2020-12-24 DIAGNOSIS — I214 Non-ST elevation (NSTEMI) myocardial infarction: Principal | ICD-10-CM | POA: Diagnosis present

## 2020-12-24 DIAGNOSIS — I25118 Atherosclerotic heart disease of native coronary artery with other forms of angina pectoris: Secondary | ICD-10-CM | POA: Diagnosis not present

## 2020-12-24 DIAGNOSIS — Z8249 Family history of ischemic heart disease and other diseases of the circulatory system: Secondary | ICD-10-CM | POA: Diagnosis not present

## 2020-12-24 DIAGNOSIS — F419 Anxiety disorder, unspecified: Secondary | ICD-10-CM | POA: Diagnosis present

## 2020-12-24 DIAGNOSIS — E1169 Type 2 diabetes mellitus with other specified complication: Secondary | ICD-10-CM | POA: Diagnosis present

## 2020-12-24 DIAGNOSIS — R000 Tachycardia, unspecified: Secondary | ICD-10-CM | POA: Diagnosis not present

## 2020-12-24 DIAGNOSIS — Z01810 Encounter for preprocedural cardiovascular examination: Secondary | ICD-10-CM | POA: Diagnosis not present

## 2020-12-24 DIAGNOSIS — D62 Acute posthemorrhagic anemia: Secondary | ICD-10-CM | POA: Diagnosis not present

## 2020-12-24 DIAGNOSIS — Z79899 Other long term (current) drug therapy: Secondary | ICD-10-CM | POA: Diagnosis not present

## 2020-12-24 DIAGNOSIS — Z720 Tobacco use: Secondary | ICD-10-CM

## 2020-12-24 DIAGNOSIS — G43909 Migraine, unspecified, not intractable, without status migrainosus: Secondary | ICD-10-CM | POA: Diagnosis present

## 2020-12-24 DIAGNOSIS — I951 Orthostatic hypotension: Secondary | ICD-10-CM | POA: Diagnosis not present

## 2020-12-24 DIAGNOSIS — I200 Unstable angina: Secondary | ICD-10-CM | POA: Diagnosis present

## 2020-12-24 DIAGNOSIS — R400 Somnolence: Secondary | ICD-10-CM | POA: Diagnosis not present

## 2020-12-24 DIAGNOSIS — E785 Hyperlipidemia, unspecified: Secondary | ICD-10-CM | POA: Diagnosis present

## 2020-12-24 DIAGNOSIS — R451 Restlessness and agitation: Secondary | ICD-10-CM | POA: Diagnosis not present

## 2020-12-24 DIAGNOSIS — Z9889 Other specified postprocedural states: Secondary | ICD-10-CM

## 2020-12-24 DIAGNOSIS — Z7982 Long term (current) use of aspirin: Secondary | ICD-10-CM | POA: Diagnosis not present

## 2020-12-24 DIAGNOSIS — Z01811 Encounter for preprocedural respiratory examination: Secondary | ICD-10-CM

## 2020-12-24 DIAGNOSIS — Z951 Presence of aortocoronary bypass graft: Secondary | ICD-10-CM

## 2020-12-24 DIAGNOSIS — Z794 Long term (current) use of insulin: Secondary | ICD-10-CM

## 2020-12-24 DIAGNOSIS — E876 Hypokalemia: Secondary | ICD-10-CM | POA: Diagnosis not present

## 2020-12-24 DIAGNOSIS — R531 Weakness: Secondary | ICD-10-CM | POA: Diagnosis not present

## 2020-12-24 DIAGNOSIS — F17210 Nicotine dependence, cigarettes, uncomplicated: Secondary | ICD-10-CM | POA: Diagnosis present

## 2020-12-24 DIAGNOSIS — J90 Pleural effusion, not elsewhere classified: Secondary | ICD-10-CM

## 2020-12-24 DIAGNOSIS — Z7984 Long term (current) use of oral hypoglycemic drugs: Secondary | ICD-10-CM | POA: Diagnosis not present

## 2020-12-24 DIAGNOSIS — I10 Essential (primary) hypertension: Secondary | ICD-10-CM | POA: Diagnosis present

## 2020-12-24 DIAGNOSIS — E119 Type 2 diabetes mellitus without complications: Secondary | ICD-10-CM

## 2020-12-24 DIAGNOSIS — Z20822 Contact with and (suspected) exposure to covid-19: Secondary | ICD-10-CM | POA: Diagnosis present

## 2020-12-24 HISTORY — PX: LEFT HEART CATH AND CORONARY ANGIOGRAPHY: CATH118249

## 2020-12-24 HISTORY — DX: Hyperlipidemia, unspecified: E78.5

## 2020-12-24 HISTORY — DX: Essential (primary) hypertension: I10

## 2020-12-24 HISTORY — DX: Angina pectoris, unspecified: I20.9

## 2020-12-24 HISTORY — PX: CARDIAC CATHETERIZATION: SHX172

## 2020-12-24 LAB — SARS CORONAVIRUS 2 BY RT PCR (HOSPITAL ORDER, PERFORMED IN ~~LOC~~ HOSPITAL LAB): SARS Coronavirus 2: NEGATIVE

## 2020-12-24 LAB — ECHOCARDIOGRAM COMPLETE
Area-P 1/2: 3.99 cm2
Calc EF: 60 %
Height: 67 in
S' Lateral: 2.5 cm
Single Plane A2C EF: 60.5 %
Single Plane A4C EF: 58.9 %
Weight: 2112 oz

## 2020-12-24 LAB — PULMONARY FUNCTION TEST
FEF 25-75 Pre: 0.66 L/sec
FEF2575-%Pred-Pre: 22 %
FEV1-%Pred-Pre: 41 %
FEV1-Pre: 1.42 L
FEV1FVC-%Pred-Pre: 76 %
FEV6-%Pred-Pre: 56 %
FEV6-Pre: 2.4 L
FEV6FVC-%Pred-Pre: 104 %
FVC-%Pred-Pre: 54 %
FVC-Pre: 2.4 L
Pre FEV1/FVC ratio: 59 %
Pre FEV6/FVC Ratio: 100 %

## 2020-12-24 LAB — GLUCOSE, CAPILLARY
Glucose-Capillary: 201 mg/dL — ABNORMAL HIGH (ref 70–99)
Glucose-Capillary: 210 mg/dL — ABNORMAL HIGH (ref 70–99)
Glucose-Capillary: 301 mg/dL — ABNORMAL HIGH (ref 70–99)
Glucose-Capillary: 223 mg/dL — ABNORMAL HIGH (ref 70–99)

## 2020-12-24 SURGERY — LEFT HEART CATH AND CORONARY ANGIOGRAPHY
Anesthesia: LOCAL

## 2020-12-24 MED ORDER — LABETALOL HCL 5 MG/ML IV SOLN: 10.0000 mg | INTRAVENOUS | Status: AC | PRN

## 2020-12-24 MED ORDER — ACETAMINOPHEN 325 MG PO TABS
650.0000 mg | ORAL_TABLET | ORAL | Status: DC | PRN
  Administered 2020-12-25 – 2020-12-26 (×4): 650 mg via ORAL
  Filled 2020-12-24 (×4): qty 2

## 2020-12-24 MED ORDER — ROPINIROLE HCL 0.25 MG PO TABS
0.2500 mg | ORAL_TABLET | Freq: Every day | ORAL | Status: DC
  Administered 2020-12-24 – 2020-12-26 (×3): 0.25 mg via ORAL
  Filled 2020-12-24 (×4): qty 1

## 2020-12-24 MED ORDER — SODIUM CHLORIDE 0.9 % WEIGHT BASED INFUSION
3.0000 mL/kg/h | INTRAVENOUS | Status: DC
  Administered 2020-12-24: 3 mL/kg/h via INTRAVENOUS

## 2020-12-24 MED ORDER — FENTANYL CITRATE (PF) 100 MCG/2ML IJ SOLN
INTRAMUSCULAR | Status: DC | PRN
  Administered 2020-12-24: 25 ug via INTRAVENOUS

## 2020-12-24 MED ORDER — HEPARIN (PORCINE) IN NACL 1000-0.9 UT/500ML-% IV SOLN
INTRAVENOUS | Status: AC
  Filled 2020-12-24: qty 1000

## 2020-12-24 MED ORDER — SODIUM CHLORIDE 0.9% FLUSH: 3.0000 mL | INTRAVENOUS | Status: DC | PRN

## 2020-12-24 MED ORDER — LIDOCAINE HCL (PF) 1 % IJ SOLN
INTRAMUSCULAR | Status: AC
  Filled 2020-12-24: qty 30

## 2020-12-24 MED ORDER — SODIUM CHLORIDE 0.9 % IV SOLN: 250.0000 mL | INTRAVENOUS | Status: DC | PRN

## 2020-12-24 MED ORDER — HEPARIN SODIUM (PORCINE) 1000 UNIT/ML IJ SOLN
INTRAMUSCULAR | Status: DC | PRN
  Administered 2020-12-24: 3000 [IU] via INTRAVENOUS

## 2020-12-24 MED ORDER — VERAPAMIL HCL 2.5 MG/ML IV SOLN
INTRAVENOUS | Status: AC
  Filled 2020-12-24: qty 2

## 2020-12-24 MED ORDER — HYDRALAZINE HCL 20 MG/ML IJ SOLN: 10.0000 mg | INTRAMUSCULAR | Status: AC | PRN

## 2020-12-24 MED ORDER — IOHEXOL 350 MG/ML SOLN
INTRAVENOUS | Status: DC | PRN
  Administered 2020-12-24: 45 mL

## 2020-12-24 MED ORDER — MIDAZOLAM HCL 2 MG/2ML IJ SOLN
INTRAMUSCULAR | Status: DC | PRN
Start: 2020-12-24 — End: 2020-12-24
  Administered 2020-12-24: 1 mg via INTRAVENOUS

## 2020-12-24 MED ORDER — SODIUM CHLORIDE 0.9 % WEIGHT BASED INFUSION: 1.0000 mL/kg/h | INTRAVENOUS | Status: DC

## 2020-12-24 MED ORDER — SODIUM CHLORIDE 0.9 % IV SOLN: INTRAVENOUS | Status: AC

## 2020-12-24 MED ORDER — HEPARIN (PORCINE) IN NACL 1000-0.9 UT/500ML-% IV SOLN
INTRAVENOUS | Status: AC
  Filled 2020-12-24: qty 500

## 2020-12-24 MED ORDER — BUSPIRONE HCL 10 MG PO TABS
10.0000 mg | ORAL_TABLET | Freq: Two times a day (BID) | ORAL | Status: DC
  Administered 2020-12-24 – 2021-01-05 (×23): 10 mg via ORAL
  Filled 2020-12-24 (×23): qty 1

## 2020-12-24 MED ORDER — ONDANSETRON HCL 4 MG/2ML IJ SOLN: 4.0000 mg | Freq: Four times a day (QID) | INTRAMUSCULAR | Status: DC | PRN

## 2020-12-24 MED ORDER — HEPARIN SODIUM (PORCINE) 1000 UNIT/ML IJ SOLN
INTRAMUSCULAR | Status: AC
  Filled 2020-12-24: qty 1

## 2020-12-24 MED ORDER — SODIUM CHLORIDE 0.9% FLUSH
3.0000 mL | Freq: Two times a day (BID) | INTRAVENOUS | Status: DC
  Administered 2020-12-24 – 2020-12-26 (×4): 3 mL via INTRAVENOUS

## 2020-12-24 MED ORDER — CARVEDILOL 3.125 MG PO TABS
3.1250 mg | ORAL_TABLET | Freq: Two times a day (BID) | ORAL | Status: DC
  Administered 2020-12-24 – 2020-12-25 (×3): 3.125 mg via ORAL
  Filled 2020-12-24 (×4): qty 1

## 2020-12-24 MED ORDER — HEPARIN (PORCINE) IN NACL 1000-0.9 UT/500ML-% IV SOLN
INTRAVENOUS | Status: DC | PRN
  Administered 2020-12-24 (×2): 500 mL

## 2020-12-24 MED ORDER — SODIUM CHLORIDE 0.9% FLUSH
3.0000 mL | Freq: Two times a day (BID) | INTRAVENOUS | Status: DC
  Administered 2020-12-24 – 2020-12-26 (×5): 3 mL via INTRAVENOUS

## 2020-12-24 MED ORDER — LISINOPRIL 10 MG PO TABS
10.0000 mg | ORAL_TABLET | Freq: Every morning | ORAL | Status: DC
  Administered 2020-12-25: 10 mg via ORAL
  Filled 2020-12-24: qty 1

## 2020-12-24 MED ORDER — VERAPAMIL HCL 2.5 MG/ML IV SOLN
INTRAVENOUS | Status: DC | PRN
  Administered 2020-12-24: 10 mL via INTRA_ARTERIAL

## 2020-12-24 MED ORDER — LIDOCAINE HCL (PF) 1 % IJ SOLN
INTRAMUSCULAR | Status: DC | PRN
  Administered 2020-12-24: 2 mL

## 2020-12-24 MED ORDER — INSULIN GLARGINE 100 UNIT/ML ~~LOC~~ SOLN
10.0000 [IU] | Freq: Every day | SUBCUTANEOUS | Status: DC
  Administered 2020-12-25 – 2020-12-26 (×2): 10 [IU] via SUBCUTANEOUS
  Filled 2020-12-24 (×3): qty 0.1

## 2020-12-24 MED ORDER — MIDAZOLAM HCL 2 MG/2ML IJ SOLN
INTRAMUSCULAR | Status: AC
  Filled 2020-12-24: qty 2

## 2020-12-24 MED ORDER — ASPIRIN 81 MG PO CHEW
81.0000 mg | CHEWABLE_TABLET | Freq: Every day | ORAL | Status: DC
  Administered 2020-12-25 – 2020-12-26 (×2): 81 mg via ORAL
  Filled 2020-12-24 (×2): qty 1

## 2020-12-24 MED ORDER — ROSUVASTATIN CALCIUM 20 MG PO TABS
20.0000 mg | ORAL_TABLET | Freq: Every day | ORAL | Status: DC
  Administered 2020-12-25: 20 mg via ORAL
  Filled 2020-12-24: qty 1

## 2020-12-24 MED ORDER — DULOXETINE HCL 60 MG PO CPEP
60.0000 mg | ORAL_CAPSULE | Freq: Every morning | ORAL | Status: DC
  Administered 2020-12-25 – 2021-01-05 (×11): 60 mg via ORAL
  Filled 2020-12-24 (×11): qty 1

## 2020-12-24 MED ORDER — PANTOPRAZOLE SODIUM 40 MG PO TBEC
40.0000 mg | DELAYED_RELEASE_TABLET | Freq: Every day | ORAL | Status: DC
Start: 2020-12-25 — End: 2020-12-27
  Administered 2020-12-25 – 2020-12-26 (×2): 40 mg via ORAL
  Filled 2020-12-24 (×2): qty 1

## 2020-12-24 MED ORDER — GABAPENTIN 300 MG PO CAPS
300.0000 mg | ORAL_CAPSULE | Freq: Three times a day (TID) | ORAL | Status: DC
  Administered 2020-12-24 – 2020-12-26 (×8): 300 mg via ORAL
  Filled 2020-12-24 (×8): qty 1

## 2020-12-24 MED ORDER — GLIPIZIDE 10 MG PO TABS
10.0000 mg | ORAL_TABLET | Freq: Two times a day (BID) | ORAL | Status: DC
  Administered 2020-12-24 – 2020-12-26 (×5): 10 mg via ORAL
  Filled 2020-12-24 (×7): qty 1

## 2020-12-24 MED ORDER — FENTANYL CITRATE (PF) 100 MCG/2ML IJ SOLN
INTRAMUSCULAR | Status: AC
  Filled 2020-12-24: qty 2

## 2020-12-24 MED ORDER — TOPIRAMATE 25 MG PO TABS
50.0000 mg | ORAL_TABLET | Freq: Two times a day (BID) | ORAL | Status: DC
  Administered 2020-12-24 – 2020-12-26 (×5): 50 mg via ORAL
  Filled 2020-12-24 (×6): qty 2

## 2020-12-24 MED ORDER — ASPIRIN 81 MG PO CHEW: 81.0000 mg | CHEWABLE_TABLET | ORAL | Status: DC

## 2020-12-24 SURGICAL SUPPLY — 8 items
CATH 5FR JL3.5 JR4 ANG PIG MP (CATHETERS) ×2 IMPLANT
DEVICE RAD TR BAND REGULAR (VASCULAR PRODUCTS) ×2 IMPLANT
GLIDESHEATH SLEND SS 6F .021 (SHEATH) ×2 IMPLANT
KIT HEART LEFT (KITS) ×2 IMPLANT
PACK CARDIAC CATHETERIZATION (CUSTOM PROCEDURE TRAY) ×2 IMPLANT
TRANSDUCER W/STOPCOCK (MISCELLANEOUS) ×2 IMPLANT
TUBING CIL FLEX 10 FLL-RA (TUBING) ×4 IMPLANT
WIRE HI TORQ VERSACORE J 260CM (WIRE) ×2 IMPLANT

## 2020-12-24 NOTE — Progress Notes (Signed)
VASCULAR LAB    Pre CABG limbs have been performed.  See CV proc for preliminary results.   Jene Oravec, RVT 12/24/2020, 4:41 PM

## 2020-12-24 NOTE — Progress Notes (Signed)
  Echocardiogram 2D Echocardiogram has been performed.  [name] 12/24/2020, 2:39 PM

## 2020-12-24 NOTE — Interval H&P Note (Signed)
History and Physical Interval Note:  12/24/2020 7:38 AM  [name]  has presented today for surgery, with the diagnosis of chest pain.  The various methods of treatment have been discussed with the patient and family. After consideration of risks, benefits and other options for treatment, the patient has consented to  Procedure(s): LEFT HEART CATH AND CORONARY ANGIOGRAPHY (N/A) as a surgical intervention.  The patient's history has been reviewed, patient examined, no change in status, stable for surgery.  I have reviewed the patient's chart and labs.  Questions were answered to the patient's satisfaction.    Cath Lab Visit (complete for each Cath Lab visit)  Clinical Evaluation Leading to the Procedure:   ACS: No.  Non-ACS:    Anginal Classification: CCS III  Anti-ischemic medical therapy: Minimal Therapy (1 class of medications)  Non-Invasive Test Results: No non-invasive testing performed  Prior CABG: No previous CABG        Verne Carrow

## 2020-12-24 NOTE — Progress Notes (Signed)
TR BAND REMOVAL  LOCATION:    Right radial  DEFLATED PER PROTOCOL:    Yes.    TIME BAND OFF / DRESSING APPLIED:    1140p a clean dry dressing applied with gauze and tegaderm   SITE UPON ARRIVAL:    Level 0  SITE AFTER BAND REMOVAL:    Level 0  CIRCULATION SENSATION AND MOVEMENT:    Within Normal Limits   Yes.    COMMENTS:   Care instruction given to patient

## 2020-12-24 NOTE — Progress Notes (Signed)
TCTS consulted for CABG evaluation. °

## 2020-12-25 ENCOUNTER — Encounter (HOSPITAL_COMMUNITY): Payer: Self-pay | Admitting: Cardiovascular Disease

## 2020-12-25 ENCOUNTER — Other Ambulatory Visit (HOSPITAL_COMMUNITY): Payer: Self-pay

## 2020-12-25 DIAGNOSIS — I10 Essential (primary) hypertension: Secondary | ICD-10-CM | POA: Diagnosis not present

## 2020-12-25 DIAGNOSIS — E1169 Type 2 diabetes mellitus with other specified complication: Secondary | ICD-10-CM

## 2020-12-25 DIAGNOSIS — Z720 Tobacco use: Secondary | ICD-10-CM | POA: Diagnosis present

## 2020-12-25 DIAGNOSIS — Z794 Long term (current) use of insulin: Secondary | ICD-10-CM

## 2020-12-25 DIAGNOSIS — E785 Hyperlipidemia, unspecified: Secondary | ICD-10-CM

## 2020-12-25 DIAGNOSIS — I25110 Atherosclerotic heart disease of native coronary artery with unstable angina pectoris: Secondary | ICD-10-CM | POA: Diagnosis not present

## 2020-12-25 DIAGNOSIS — E119 Type 2 diabetes mellitus without complications: Secondary | ICD-10-CM

## 2020-12-25 DIAGNOSIS — I200 Unstable angina: Secondary | ICD-10-CM

## 2020-12-25 LAB — CBC
HCT: 41 % (ref 39.0–52.0)
Hemoglobin: 13.8 g/dL (ref 13.0–17.0)
MCH: 29.4 pg (ref 26.0–34.0)
MCHC: 33.7 g/dL (ref 30.0–36.0)
MCV: 87.4 fL (ref 80.0–100.0)
Platelets: 236 10*3/uL (ref 150–400)
RBC: 4.69 MIL/uL (ref 4.22–5.81)
RDW: 13.1 % (ref 11.5–15.5)
WBC: 8.2 10*3/uL (ref 4.0–10.5)
nRBC: 0 % (ref 0.0–0.2)

## 2020-12-25 LAB — GLUCOSE, CAPILLARY
Glucose-Capillary: 271 mg/dL — ABNORMAL HIGH (ref 70–99)
Glucose-Capillary: 262 mg/dL — ABNORMAL HIGH (ref 70–99)
Glucose-Capillary: 285 mg/dL — ABNORMAL HIGH (ref 70–99)
Glucose-Capillary: 536 mg/dL (ref 70–99)
Glucose-Capillary: 537 mg/dL (ref 70–99)

## 2020-12-25 LAB — BASIC METABOLIC PANEL
Anion gap: 6 (ref 5–15)
Anion gap: 9 (ref 5–15)
BUN: 14 mg/dL (ref 6–20)
BUN: 21 mg/dL — ABNORMAL HIGH (ref 6–20)
CO2: 27 mmol/L (ref 22–32)
CO2: 21 mmol/L — ABNORMAL LOW (ref 22–32)
Calcium: 9.7 mg/dL (ref 8.9–10.3)
Calcium: 9.1 mg/dL (ref 8.9–10.3)
Chloride: 107 mmol/L (ref 98–111)
Chloride: 103 mmol/L (ref 98–111)
Creatinine, Ser: 0.73 mg/dL (ref 0.61–1.24)
Creatinine, Ser: 1.06 mg/dL (ref 0.61–1.24)
GFR, Estimated: >60 mL/min (ref >60)
GFR, Estimated: >60 mL/min (ref >60)
Glucose, Bld: 220 mg/dL — ABNORMAL HIGH (ref 70–99)
Glucose, Bld: 556 mg/dL (ref 70–99)
Potassium: 4 mmol/L (ref 3.5–5.1)
Potassium: 3.8 mmol/L (ref 3.5–5.1)
Sodium: 140 mmol/L (ref 135–145)
Sodium: 133 mmol/L — ABNORMAL LOW (ref 135–145)

## 2020-12-25 MED ORDER — DEXAMETHASONE SODIUM PHOSPHATE 10 MG/ML IJ SOLN
4.0000 mg | Freq: Once | INTRAMUSCULAR | Status: AC
  Administered 2020-12-25: 4 mg via INTRAVENOUS
  Filled 2020-12-25: qty 1

## 2020-12-25 MED ORDER — INSULIN ASPART 100 UNIT/ML IJ SOLN
0.0000 [IU] | Freq: Every day | INTRAMUSCULAR | Status: DC
  Administered 2020-12-25: 5 [IU] via SUBCUTANEOUS
  Administered 2020-12-26: 4 [IU] via SUBCUTANEOUS

## 2020-12-25 MED ORDER — ROSUVASTATIN CALCIUM 20 MG PO TABS
40.0000 mg | ORAL_TABLET | Freq: Every day | ORAL | Status: DC
  Administered 2020-12-26 – 2021-01-05 (×10): 40 mg via ORAL
  Filled 2020-12-25 (×10): qty 2

## 2020-12-25 MED ORDER — DIPHENHYDRAMINE HCL 50 MG/ML IJ SOLN
12.5000 mg | Freq: Once | INTRAMUSCULAR | Status: AC
  Administered 2020-12-25: 12.5 mg via INTRAVENOUS
  Filled 2020-12-25: qty 1

## 2020-12-25 MED ORDER — INSULIN ASPART 100 UNIT/ML IJ SOLN
15.0000 [IU] | Freq: Once | INTRAMUSCULAR | Status: AC
  Administered 2020-12-25: 15 [IU] via SUBCUTANEOUS

## 2020-12-25 MED ORDER — INSULIN ASPART 100 UNIT/ML IJ SOLN
0.0000 [IU] | Freq: Three times a day (TID) | INTRAMUSCULAR | Status: DC
  Administered 2020-12-26: 8 [IU] via SUBCUTANEOUS
  Administered 2020-12-26: 11 [IU] via SUBCUTANEOUS
  Administered 2020-12-27: 3 [IU] via SUBCUTANEOUS

## 2020-12-25 MED ORDER — METOCLOPRAMIDE HCL 5 MG/ML IJ SOLN
5.0000 mg | Freq: Once | INTRAMUSCULAR | Status: AC
  Administered 2020-12-25: 5 mg via INTRAVENOUS
  Filled 2020-12-25: qty 2

## 2020-12-25 MED FILL — Heparin Sod (Porcine)-NaCl IV Soln 1000 Unit/500ML-0.9%: INTRAVENOUS | Qty: 500 | Status: AC

## 2020-12-25 NOTE — Progress Notes (Signed)
Inpatient Diabetes Program Recommendations  AACE/ADA: New Consensus Statement on Inpatient Glycemic Control   Target Ranges:  Prepandial:   less than 140 mg/dL      Peak postprandial:   less than 180 mg/dL (1-2 hours)      Critically ill patients:  140 - 180 mg/dL   Results for MIKEN, STECHER (MRN [id number]) as of 12/25/2020 10:33  Ref. Range 12/24/2020 07:31 12/24/2020 10:23 12/24/2020 17:15 12/24/2020 21:14 12/25/2020 05:47  Glucose-Capillary Latest Ref Range: 70 - 99 mg/dL 564 (H) 332 (H) 951 (H) 223 (H) 271 (H)    Review of Glycemic Control  Diabetes history: DM2 Outpatient Diabetes medications: Glipizide 10 mg BID, Basaglar 10 units QAM, Metformin 500 mg BID Current orders for Inpatient glycemic control: Lantus 10 units daily, Glipizide 10 mg BID  Inpatient Diabetes Program Recommendations:    Insulin: Please consider ordering Novolog 0-15 units TID with meals and Novolog 0-5 units QHS.  HbgA1C: Please consider ordering an A1C to evaluate glycemic control over the past 2-3 months.  Thanks, Orlando Penner, RN, MSN, CDE Diabetes Coordinator Inpatient Diabetes Program [phone] (Team Pager from 8am to 5pm)

## 2020-12-25 NOTE — TOC Benefit Eligibility Note (Signed)
Patient Product/process development scientist completed.    The patient is currently admitted and upon discharge could be taking Farxiga 10 mg.  Requires Prior Authorization   The patient is currently admitted and upon discharge could be taking Jardiance 10 mg.  Requires Prior Authorization   The patient is insured through [facility]    Roland Earl, CPhT Pharmacy Patient Advocate Specialist Operating Room Services Antimicrobial Stewardship Team Direct Number: [phone]  Fax: [phone]

## 2020-12-25 NOTE — Progress Notes (Signed)
Progress Note  Patient Name: [name] Date of Encounter: 12/25/2020  [facility] Cardiologist: Laurance Flatten, MD  Patient Profile     55 y.o. male with prior history of DM-2 (on insulin), HTN, HLD, moderate bilateral carotid artery disease, and chronic tobacco use seen by Dr. Shari Prows for dizziness and exertional dyspnea.  During initial consultation, patient also noted having intermittent chest pain/aching most notable when he was "working-out".  On occasion, he also woke up with chest pain.  Because of abnormal EKG with T wave inversions and high risk features, he was referred for cardiac catheterization on 12/24/2020.  Found to have multivessel disease and referred for CABG.  Subjective   No active chest pain.  Just has been feeling "miserable for the last couple months ". Is willing to consider smoking cessation while he is in the hospital.  Assessment & Plan    Principal Problem:   Coronary artery disease involving native coronary artery of native heart with unstable angina pectoris (HCC) Active Problems:   Diabetes mellitus, type II, insulin dependent (HCC)   Hyperlipidemia associated with type 2 diabetes mellitus (HCC)   Essential hypertension   Tobacco use   Unstable angina (HCC)   Principal Problem:   Coronary artery disease involving native coronary artery of native heart with unstable angina pectoris (HCC) / Unstable angina (HCC) -> multivessel CAD on Cath - CVTS consulted for CABG Converted from atorvastatin to rosuvastatin prior to admission, will increase dose to 40 mg. On carvedilol and lisinopril along with aspirin.    Diabetes mellitus, type II, insulin dependent (HCC) / Hyperlipidemia associated with type 2 diabetes mellitus (HCC) As noted, increase statin dose based on multivessel disease. Currently on glipizide Lantus and sliding scale. ->  Consider SGLT2 inhibitor on discharge Restart home insulin regimen on discharge. On ACE inhibitor.       Essential hypertension BP relatively stable on BB & ACE-I    Tobacco use -> On BuSpar      Inpatient Medications    Scheduled Meds:  aspirin  81 mg Oral Daily   busPIRone  10 mg Oral BID   carvedilol  3.125 mg Oral BID   DULoxetine  60 mg Oral q AM   gabapentin  300 mg Oral TID   glipiZIDE  10 mg Oral BID WC   insulin glargine  10 Units Subcutaneous Daily   lisinopril  10 mg Oral q AM   pantoprazole  40 mg Oral Daily   rOPINIRole  0.25 mg Oral QHS   rosuvastatin  20 mg Oral Daily   sodium chloride flush  3 mL Intravenous Q12H   sodium chloride flush  3 mL Intravenous Q12H   topiramate  50 mg Oral BID   Continuous Infusions:  sodium chloride     PRN Meds: sodium chloride, acetaminophen, ondansetron (ZOFRAN) IV, sodium chloride flush   Vital Signs    Vitals:   12/24/20 1957 12/25/20 0017 12/25/20 0418 12/25/20 0713  BP: 139/76 125/71 (!) 108/55 126/66  Pulse: 85 86 83 85  Resp: 18 16 18 16   Temp: 97.8 F (36.6 C) 97.6 F (36.4 C) 98.3 F (36.8 C) 98 F (36.7 C)  TempSrc: Oral Oral Oral Oral  SpO2: 100% 100% 100% 100%  Weight:   57.9 kg   Height:        Intake/Output Summary (Last 24 hours) at 12/25/2020 0943 Last data filed at 12/25/2020 0649 Gross per 24 hour  Intake 989.25 ml  Output 1600 ml  Net -  610.75 ml   Last 3 Weights 12/25/2020 12/24/2020 12/24/2020  Weight (lbs) 127 lb 11.2 oz 129 lb 12.8 oz 132 lb  Weight (kg) 57.924 kg 58.877 kg 59.875 kg      Telemetry    SR - Personally Reviewed  ECG    No current EKG- Personally Reviewed  Physical Exam   GEN: Resting comfortably in bed.  No acute distress.   Neck: No JVD or carotid bruit. Cardiac: RRR, normal S1 and S2.  No murmurs, rubs, or gallops.  Respiratory: Clear to auscultation bilaterally.  Nonlabored, good air movement. GI: Soft, nontender, non-distended  MS: No edema; No deformity. Neuro:  Nonfocal ; A&O x3 Psych: Normal affect   Labs    High Sensitivity Troponin:  No results for  input(s): TROPONINIHS in the last 720 hours.    Chemistry Recent Labs  Lab 12/25/20 0151  NA 140  K 4.0  CL 107  CO2 27  GLUCOSE 220*  BUN 14  CREATININE 0.73  CALCIUM 9.7  GFRNONAA >60  ANIONGAP 6     Hematology Recent Labs  Lab 12/25/20 0151  WBC 8.2  RBC 4.69  HGB 13.8  HCT 41.0  MCV 87.4  MCH 29.4  MCHC 33.7  RDW 13.1  PLT 236    BNPNo results for input(s): BNP, PROBNP in the last 168 hours.   DDimer No results for input(s): DDIMER in the last 168 hours.   Radiology/Cardiology Studies     CARDIAC CATHETERIZATION  Result Date: 12/24/2020  Prox RCA lesion is 100% stenosed.  Ost Cx to Prox Cx lesion is 70% stenosed.  Dist Cx lesion is 99% stenosed.  1st Mrg lesion is 70% stenosed.  Prox LAD to Mid LAD lesion is 70% stenosed.  1st Diag lesion is 70% stenosed.  Mid LAD lesion is 50% stenosed.  1. Severe three vessel CAD 2. Severe proximal to mid LAD stenosis. Severe stenosis in the moderate caliber first diagonal branch. 3. Severe stenosis proximal Circumflex and distal AV groove Circumflex. Severe stenosis first obtuse marginal branch. 4. Chronic occlusion of the dominant RCA. The PDA fills from left to right collaterals. Recommendations: He has been having daily angina. Will admit to telemetry. Echo to assess LV function. CT surgery consult for CABG.    ECHOCARDIOGRAM COMPLETE IMPRESSIONS  1. Left ventricular ejection fraction, by estimation, is 55 to 60%. The left ventricle has normal function. The left ventricle has no regional wall motion abnormalities. Left ventricular diastolic parameters are consistent with Grade I diastolic dysfunction (impaired relaxation).  2. Right ventricular systolic function is normal. The right ventricular size is normal.  3. The mitral valve is normal in structure. No evidence of mitral valve regurgitation. No evidence of mitral stenosis.  4. The aortic valve was not well visualized. Aortic valve regurgitation is not visualized. No  aortic stenosis is present.  5. The inferior vena cava is normal in size with greater than 50% respiratory variability, suggesting right atrial pressure of 3 mmHg. Comparison(s): No prior Echocardiogram.   VAS US DOPPLER PRE CABG  Result Date: 12/24/2020 PREOPERATIVE VASCULAR EVALUATION Patient Name:  [name]  Date of Exam:   12/24/2020 Medical Rec #: [id number]       Accession #:    [id number] Date of Birth: 1965-09-21      Patient Gender: M Patient Age:   54Y Exam Location:  [facility] Procedure:      VAS US DOPPLER PRE CABG Referring Phys: [id number] BROADUS Z ATKINS --------------------------------------------------------------------------------  Indications:      Pre-CABG. Risk Factors:     Diabetes, current smoker, coronary artery disease. Other Factors:    Patein had CTA of neck indicating 50% right ICA stenosis and                   <50% right ICA stenosis, 09/20/2020. Comparison Study: No prior studies. Performing Technologist: Sherren Kerns RVS  Examination Guidelines: A complete evaluation includes B-mode imaging, spectral Doppler, color Doppler, and power Doppler as needed of all accessible portions of each vessel. Bilateral testing is considered an integral part of a complete examination. Limited examinations for reoccurring indications may be performed as noted.  ABI Findings: +---------+------------------+-----+-------------------+--------+ Right    Rt Pressure (mmHg)IndexWaveform           Comment  +---------+------------------+-----+-------------------+--------+ Brachial 129                    multiphasic                 +---------+------------------+-----+-------------------+--------+ PTA                             absent                      +---------+------------------+-----+-------------------+--------+ DP       67                0.52 dampened monophasic         +---------+------------------+-----+-------------------+--------+ Great Toe30                 0.23                             +---------+------------------+-----+-------------------+--------+ +---------+------------------+-----+-------------------+------------------+ Left     Lt Pressure (mmHg)IndexWaveform           Comment            +---------+------------------+-----+-------------------+------------------+ Brachial                                           IV, bandages, tape +---------+------------------+-----+-------------------+------------------+ PTA                             absent                                +---------+------------------+-----+-------------------+------------------+ DP       79                0.61 dampened monophasic                   +---------+------------------+-----+-------------------+------------------+ Great Toe62                0.48                                       +---------+------------------+-----+-------------------+------------------+ +-------+---------------+----------------+ ABI/TBIToday's ABI/TBIPrevious ABI/TBI +-------+---------------+----------------+ Right  0.52                            +-------+---------------+----------------+ Left   0.61                            +-------+---------------+----------------+  Right Doppler Findings: +--------+--------+-----+-----------+--------+ Site    PressureIndexDoppler    Comments +--------+--------+-----+-----------+--------+ [id number]          multiphasic         +--------+--------+-----+-----------+--------+ Radial               multiphasic         +--------+--------+-----+-----------+--------+ Ulnar                multiphasic         +--------+--------+-----+-----------+--------+  Left Doppler Findings: +--------+--------+-----+-----------+------------------+ Site    PressureIndexDoppler    Comments           +--------+--------+-----+-----------+------------------+ Brachial                        IV, bandages, tape  +--------+--------+-----+-----------+------------------+ Radial               multiphasic                   +--------+--------+-----+-----------+------------------+ Ulnar                multiphasic                   +--------+--------+-----+-----------+------------------+  Right ABI: Resting right ankle-brachial index indicates moderate right lower extremity arterial disease. The right toe-brachial index is abnormal. Left ABI: Resting left ankle-brachial index indicates moderate left lower extremity arterial disease. The left toe-brachial index is abnormal. Right Upper Extremity: Doppler waveforms remain within normal limits with right radial compression. Doppler waveforms remain within normal limits with right ulnar compression. Left Upper Extremity: Doppler waveforms remain within normal limits with left radial compression. Doppler waveform obliterate with left ulnar compression.     Preliminary       For questions or updates, please contact [facility] Please consult [email or web address] for contact info under        Signed, Bryan Lemmaavid Canna Nickelson, MD  12/25/2020, 9:43 AM

## 2020-12-25 NOTE — Progress Notes (Signed)
CBG = 537. MD paged.

## 2020-12-25 NOTE — Progress Notes (Signed)
CBG = 536 tonight. STAT BMET ordered per protocol. MD paged.

## 2020-12-26 ENCOUNTER — Inpatient Hospital Stay (HOSPITAL_COMMUNITY): Payer: Medicaid Other

## 2020-12-26 DIAGNOSIS — E119 Type 2 diabetes mellitus without complications: Secondary | ICD-10-CM | POA: Diagnosis not present

## 2020-12-26 DIAGNOSIS — Z91120 Patient's intentional underdosing of medication regimen due to financial hardship: Secondary | ICD-10-CM

## 2020-12-26 DIAGNOSIS — I10 Essential (primary) hypertension: Secondary | ICD-10-CM | POA: Diagnosis not present

## 2020-12-26 DIAGNOSIS — E1169 Type 2 diabetes mellitus with other specified complication: Secondary | ICD-10-CM

## 2020-12-26 DIAGNOSIS — Z720 Tobacco use: Secondary | ICD-10-CM

## 2020-12-26 DIAGNOSIS — I25118 Atherosclerotic heart disease of native coronary artery with other forms of angina pectoris: Secondary | ICD-10-CM

## 2020-12-26 DIAGNOSIS — I25110 Atherosclerotic heart disease of native coronary artery with unstable angina pectoris: Secondary | ICD-10-CM | POA: Diagnosis not present

## 2020-12-26 LAB — BASIC METABOLIC PANEL
Anion gap: 8 (ref 5–15)
BUN: 28 mg/dL — ABNORMAL HIGH (ref 6–20)
CO2: 22 mmol/L (ref 22–32)
Calcium: 9.4 mg/dL (ref 8.9–10.3)
Chloride: 109 mmol/L (ref 98–111)
Creatinine, Ser: 1.44 mg/dL — ABNORMAL HIGH (ref 0.61–1.24)
GFR, Estimated: 57 mL/min — ABNORMAL LOW (ref >60)
Glucose, Bld: 128 mg/dL — ABNORMAL HIGH (ref 70–99)
Potassium: 3.3 mmol/L — ABNORMAL LOW (ref 3.5–5.1)
Sodium: 139 mmol/L (ref 135–145)

## 2020-12-26 LAB — URINALYSIS, ROUTINE W REFLEX MICROSCOPIC
Bacteria, UA: NONE SEEN
Bilirubin Urine: NEGATIVE
Glucose, UA: >=500 mg/dL — AB
Hgb urine dipstick: NEGATIVE
Ketones, ur: NEGATIVE mg/dL
Leukocytes,Ua: NEGATIVE
Nitrite: NEGATIVE
Protein, ur: NEGATIVE mg/dL
Specific Gravity, Urine: 1.007 (ref 1.005–1.030)
pH: 6 (ref 5.0–8.0)

## 2020-12-26 LAB — GLUCOSE, CAPILLARY
Glucose-Capillary: 163 mg/dL — ABNORMAL HIGH (ref 70–99)
Glucose-Capillary: 292 mg/dL — ABNORMAL HIGH (ref 70–99)
Glucose-Capillary: 314 mg/dL — ABNORMAL HIGH (ref 70–99)
Glucose-Capillary: 322 mg/dL — ABNORMAL HIGH (ref 70–99)
Glucose-Capillary: 332 mg/dL — ABNORMAL HIGH (ref 70–99)
Glucose-Capillary: 373 mg/dL — ABNORMAL HIGH (ref 70–99)
Glucose-Capillary: 429 mg/dL — ABNORMAL HIGH (ref 70–99)
Glucose-Capillary: 97 mg/dL (ref 70–99)

## 2020-12-26 LAB — LIPID PANEL
Cholesterol: 130 mg/dL (ref 0–200)
HDL: 41 mg/dL (ref >40)
LDL Cholesterol: 68 mg/dL (ref 0–99)
Total CHOL/HDL Ratio: 3.2 RATIO
Triglycerides: 107 mg/dL (ref <150)
VLDL: 21 mg/dL (ref 0–40)

## 2020-12-26 LAB — ABO/RH: ABO/RH(D): A POS

## 2020-12-26 LAB — PREPARE RBC (CROSSMATCH)

## 2020-12-26 LAB — HEMOGLOBIN A1C
Hgb A1c MFr Bld: 9.9 % — ABNORMAL HIGH (ref 4.8–5.6)
Mean Plasma Glucose: 237.43 mg/dL

## 2020-12-26 LAB — SURGICAL PCR SCREEN
MRSA, PCR: NEGATIVE
Staphylococcus aureus: NEGATIVE

## 2020-12-26 IMAGING — DX DG CHEST 2V
2 series · 2 of 2 positions shown · non-contrast
Comparison: [DATE]

CLINICAL DATA: Preoperative evaluation

EXAM:
CHEST - 2 VIEW

[chest pa]
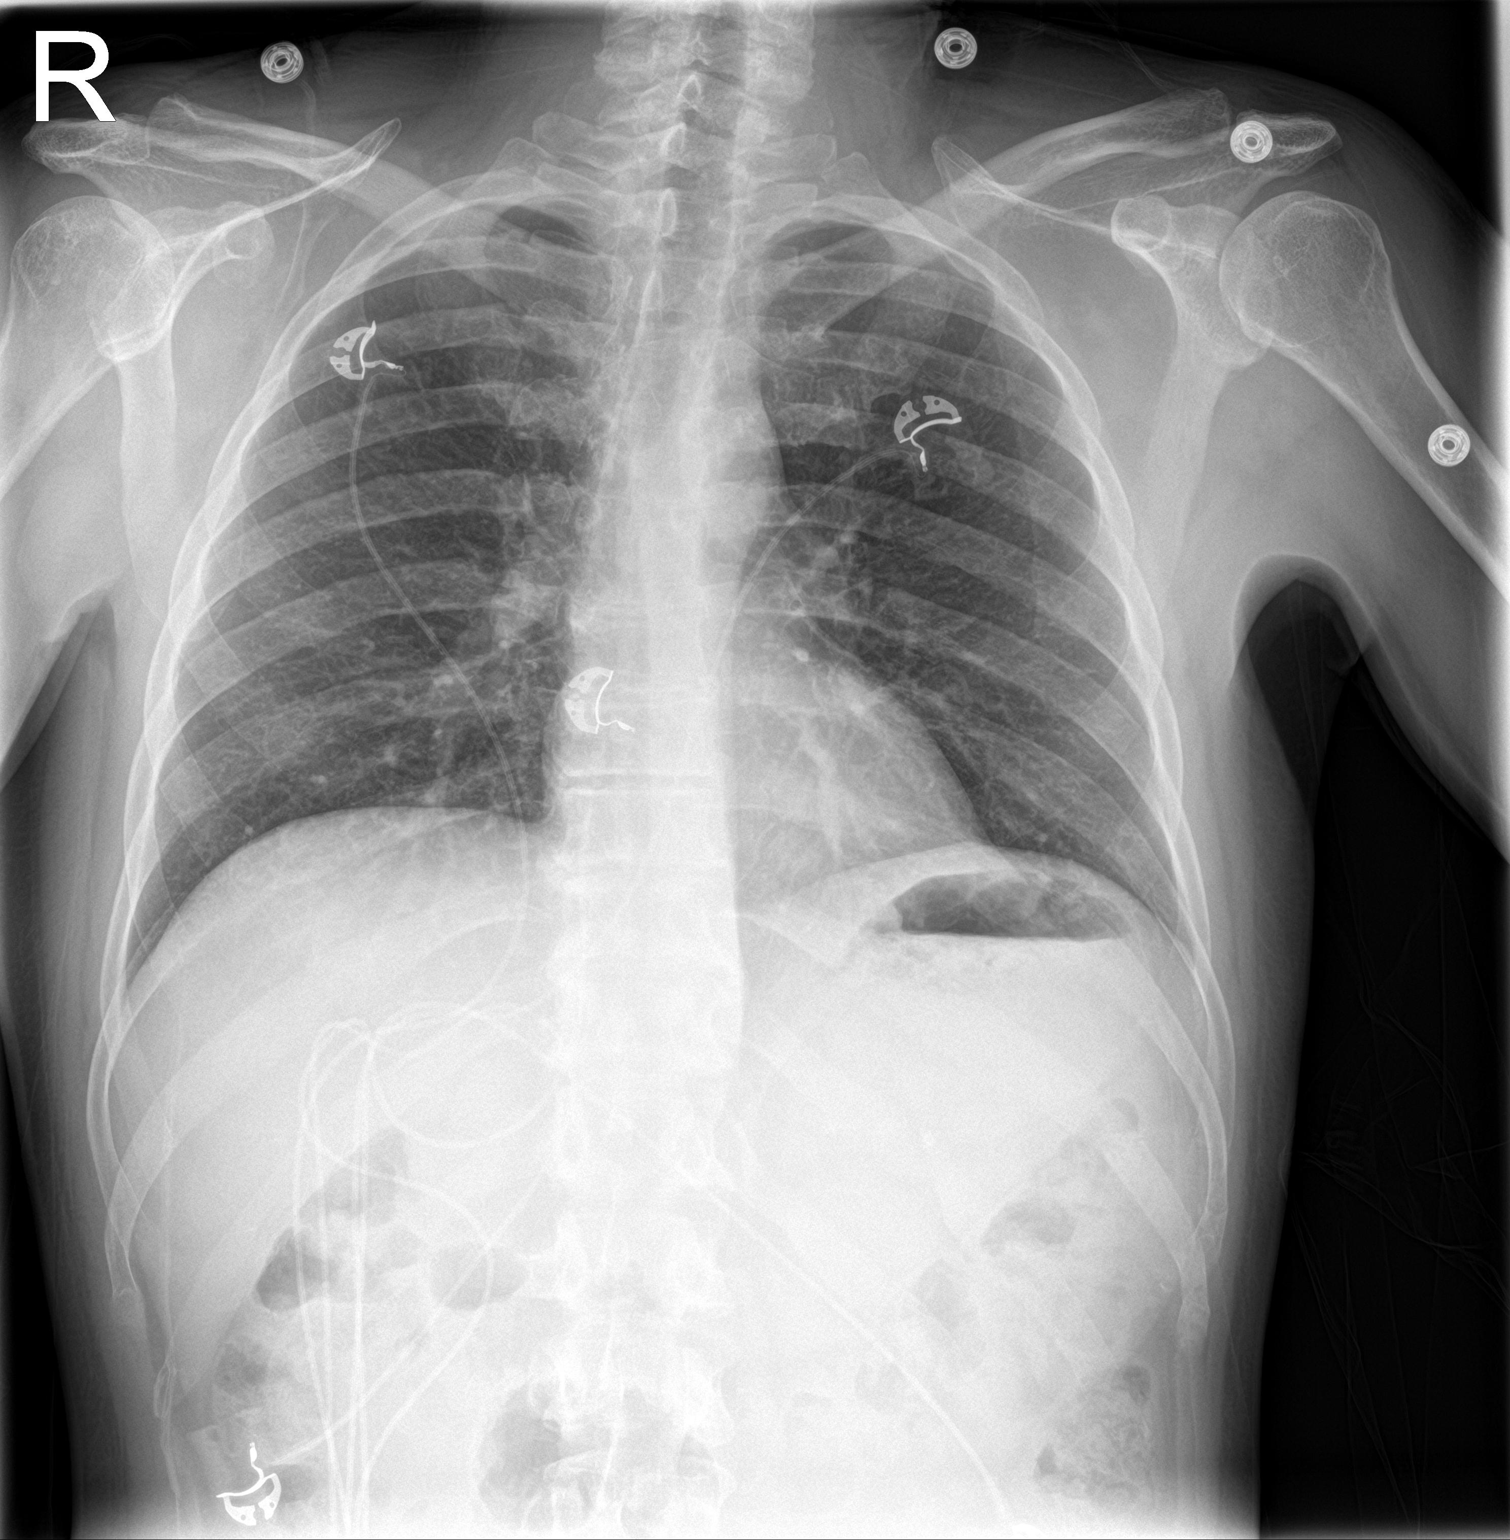

[chest lat]
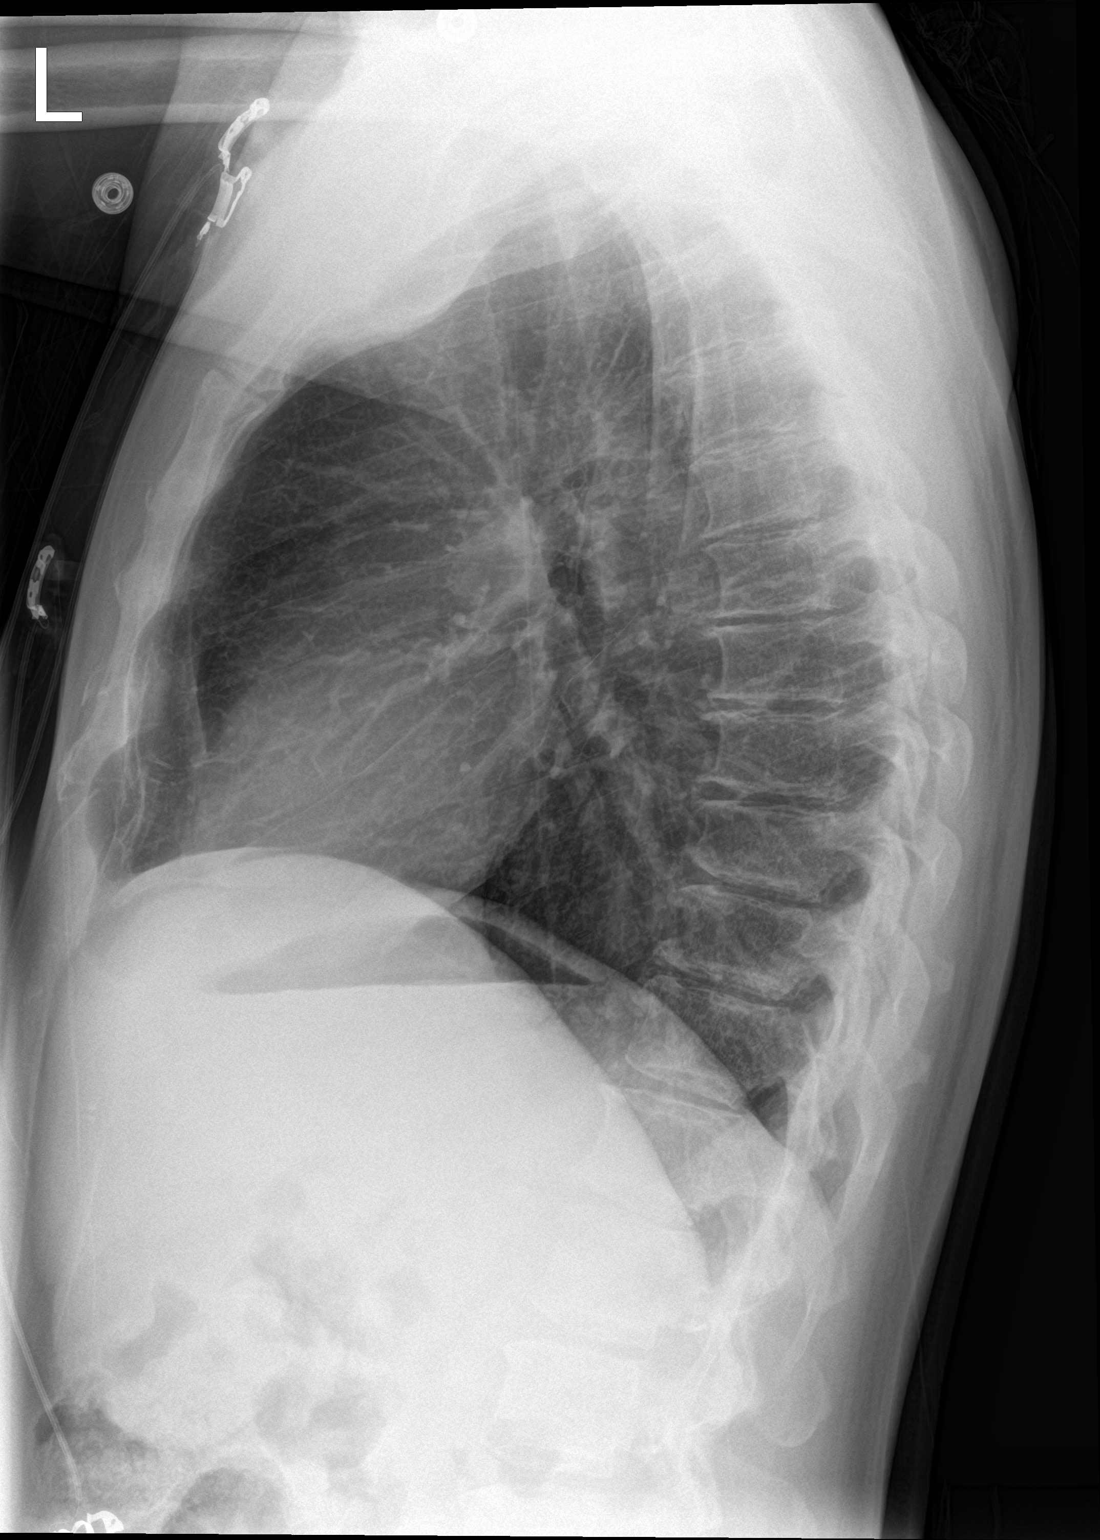

[2 of 2 positions shown; findings below may reference images not displayed]

FINDINGS: The heart size and mediastinal contours are within normal limits.
Both lungs are clear. The visualized skeletal structures are
unremarkable.
IMPRESSION: No active cardiopulmonary disease.

## 2020-12-26 MED ORDER — POTASSIUM CHLORIDE CRYS ER 20 MEQ PO TBCR
40.0000 meq | EXTENDED_RELEASE_TABLET | Freq: Once | ORAL | Status: AC
  Administered 2020-12-26: 40 meq via ORAL

## 2020-12-26 MED ORDER — DEXMEDETOMIDINE HCL IN NACL 400 MCG/100ML IV SOLN
0.1000 ug/kg/h | INTRAVENOUS | Status: DC
  Filled 2020-12-26: qty 100

## 2020-12-26 MED ORDER — POTASSIUM CHLORIDE 2 MEQ/ML IV SOLN
80.0000 meq | INTRAVENOUS | Status: DC
  Filled 2020-12-26: qty 40

## 2020-12-26 MED ORDER — DEXMEDETOMIDINE HCL IN NACL 400 MCG/100ML IV SOLN
0.1000 ug/kg/h | INTRAVENOUS | Status: AC
  Administered 2020-12-27: .5 ug/kg/h via INTRAVENOUS
  Filled 2020-12-26: qty 100

## 2020-12-26 MED ORDER — INSULIN REGULAR(HUMAN) IN NACL 100-0.9 UT/100ML-% IV SOLN
INTRAVENOUS | Status: DC
  Filled 2020-12-26: qty 100

## 2020-12-26 MED ORDER — POTASSIUM CHLORIDE IN NACL 40-0.9 MEQ/L-% IV SOLN
INTRAVENOUS | Status: DC
  Filled 2020-12-26: qty 1000

## 2020-12-26 MED ORDER — EPINEPHRINE HCL 5 MG/250ML IV SOLN IN NS
0.0000 ug/min | INTRAVENOUS | Status: DC
  Filled 2020-12-26: qty 250

## 2020-12-26 MED ORDER — MILRINONE LACTATE IN DEXTROSE 20-5 MG/100ML-% IV SOLN
0.3000 ug/kg/min | INTRAVENOUS | Status: DC
  Filled 2020-12-26: qty 100

## 2020-12-26 MED ORDER — TRANEXAMIC ACID 1000 MG/10ML IV SOLN
1.5000 mg/kg/h | INTRAVENOUS | Status: AC
  Administered 2020-12-27: 1.5 mg/kg/h via INTRAVENOUS
  Filled 2020-12-26: qty 25

## 2020-12-26 MED ORDER — VANCOMYCIN HCL 1250 MG/250ML IV SOLN
1250.0000 mg | INTRAVENOUS | Status: AC
  Administered 2020-12-27: 1250 mg via INTRAVENOUS
  Filled 2020-12-26: qty 250

## 2020-12-26 MED ORDER — EPINEPHRINE HCL 5 MG/250ML IV SOLN IN NS
0.0000 ug/min | INTRAVENOUS | Status: DC
Start: 2020-12-27 — End: 2020-12-27
  Filled 2020-12-26: qty 250

## 2020-12-26 MED ORDER — TRANEXAMIC ACID (OHS) BOLUS VIA INFUSION
15.0000 mg/kg | INTRAVENOUS | Status: DC
  Filled 2020-12-26: qty 875

## 2020-12-26 MED ORDER — TRANEXAMIC ACID 1000 MG/10ML IV SOLN
1.5000 mg/kg/h | INTRAVENOUS | Status: DC
  Filled 2020-12-26: qty 25

## 2020-12-26 MED ORDER — PHENYLEPHRINE HCL-NACL 20-0.9 MG/250ML-% IV SOLN
30.0000 ug/min | INTRAVENOUS | Status: DC
  Filled 2020-12-26: qty 250

## 2020-12-26 MED ORDER — POTASSIUM CHLORIDE CRYS ER 20 MEQ PO TBCR
40.0000 meq | EXTENDED_RELEASE_TABLET | Freq: Four times a day (QID) | ORAL | Status: DC
  Filled 2020-12-26: qty 2

## 2020-12-26 MED ORDER — NITROGLYCERIN IN D5W 200-5 MCG/ML-% IV SOLN
2.0000 ug/min | INTRAVENOUS | Status: AC
  Administered 2020-12-27: 16.6 ug/min via INTRAVENOUS
  Filled 2020-12-26: qty 250

## 2020-12-26 MED ORDER — TRANEXAMIC ACID (OHS) PUMP PRIME SOLUTION
2.0000 mg/kg | INTRAVENOUS | Status: DC
  Filled 2020-12-26: qty 1.17

## 2020-12-26 MED ORDER — INSULIN GLARGINE 100 UNIT/ML ~~LOC~~ SOLN
10.0000 [IU] | Freq: Once | SUBCUTANEOUS | Status: AC
  Administered 2020-12-26: 10 [IU] via SUBCUTANEOUS
  Filled 2020-12-26: qty 0.1

## 2020-12-26 MED ORDER — MANNITOL 20 % IV SOLN
Freq: Once | INTRAVENOUS | Status: DC
  Filled 2020-12-26: qty 13

## 2020-12-26 MED ORDER — CHLORHEXIDINE GLUCONATE CLOTH 2 % EX PADS
6.0000 | MEDICATED_PAD | Freq: Once | CUTANEOUS | Status: AC
  Administered 2020-12-27: 6 via TOPICAL

## 2020-12-26 MED ORDER — CEFAZOLIN SODIUM-DEXTROSE 2-4 GM/100ML-% IV SOLN
2.0000 g | INTRAVENOUS | Status: DC
  Filled 2020-12-26: qty 100

## 2020-12-26 MED ORDER — METOPROLOL TARTRATE 12.5 MG HALF TABLET
12.5000 mg | ORAL_TABLET | Freq: Once | ORAL | Status: AC
  Administered 2020-12-27: 12.5 mg via ORAL
  Filled 2020-12-26: qty 1

## 2020-12-26 MED ORDER — CEFAZOLIN SODIUM-DEXTROSE 2-4 GM/100ML-% IV SOLN
2.0000 g | INTRAVENOUS | Status: AC
  Administered 2020-12-27: 2 g via INTRAVENOUS
  Filled 2020-12-26: qty 100

## 2020-12-26 MED ORDER — SODIUM CHLORIDE 0.9 % IV SOLN
INTRAVENOUS | Status: DC
  Filled 2020-12-26: qty 30

## 2020-12-26 MED ORDER — NOREPINEPHRINE 4 MG/250ML-% IV SOLN
0.0000 ug/min | INTRAVENOUS | Status: DC
  Filled 2020-12-26: qty 250

## 2020-12-26 MED ORDER — LIVING WELL WITH DIABETES BOOK
Freq: Once | Status: AC
  Filled 2020-12-26: qty 1

## 2020-12-26 MED ORDER — BISACODYL 5 MG PO TBEC
5.0000 mg | DELAYED_RELEASE_TABLET | Freq: Once | ORAL | Status: AC
  Administered 2020-12-26: 5 mg via ORAL
  Filled 2020-12-26: qty 1

## 2020-12-26 MED ORDER — PLASMA-LYTE A IV SOLN
INTRAVENOUS | Status: DC
  Filled 2020-12-26: qty 5

## 2020-12-26 MED ORDER — INSULIN ASPART 100 UNIT/ML IJ SOLN
15.0000 [IU] | Freq: Once | INTRAMUSCULAR | Status: AC
  Administered 2020-12-26: 15 [IU] via SUBCUTANEOUS

## 2020-12-26 MED ORDER — CELECOXIB 200 MG PO CAPS
200.0000 mg | ORAL_CAPSULE | Freq: Once | ORAL | Status: AC
  Administered 2020-12-27: 200 mg via ORAL
  Filled 2020-12-26: qty 1

## 2020-12-26 MED ORDER — CHLORHEXIDINE GLUCONATE 0.12 % MT SOLN
15.0000 mL | Freq: Once | OROMUCOSAL | Status: AC
Start: 2020-12-27 — End: 2020-12-27
  Administered 2020-12-27: 15 mL via OROMUCOSAL
  Filled 2020-12-26: qty 15

## 2020-12-26 MED ORDER — ACETAMINOPHEN 500 MG PO TABS
1000.0000 mg | ORAL_TABLET | Freq: Once | ORAL | Status: AC
  Administered 2020-12-27: 1000 mg via ORAL
  Filled 2020-12-26: qty 2

## 2020-12-26 MED ORDER — MAGNESIUM SULFATE 50 % IJ SOLN
40.0000 meq | INTRAMUSCULAR | Status: DC
  Filled 2020-12-26: qty 9.85

## 2020-12-26 MED ORDER — PHENYLEPHRINE HCL-NACL 20-0.9 MG/250ML-% IV SOLN
30.0000 ug/min | INTRAVENOUS | Status: AC
  Administered 2020-12-27: 20 ug/min via INTRAVENOUS
  Filled 2020-12-26: qty 250

## 2020-12-26 MED ORDER — NOREPINEPHRINE 4 MG/250ML-% IV SOLN
0.0000 ug/min | INTRAVENOUS | Status: AC
  Administered 2020-12-27: 2 ug/min via INTRAVENOUS
  Filled 2020-12-26: qty 250

## 2020-12-26 MED ORDER — VANCOMYCIN HCL 1250 MG/250ML IV SOLN
1250.0000 mg | INTRAVENOUS | Status: DC
  Filled 2020-12-26: qty 250

## 2020-12-26 MED ORDER — NITROGLYCERIN IN D5W 200-5 MCG/ML-% IV SOLN
2.0000 ug/min | INTRAVENOUS | Status: DC
  Filled 2020-12-26: qty 250

## 2020-12-26 MED ORDER — TEMAZEPAM 15 MG PO CAPS
15.0000 mg | ORAL_CAPSULE | Freq: Once | ORAL | Status: AC | PRN
  Administered 2020-12-26: 15 mg via ORAL
  Filled 2020-12-26: qty 1

## 2020-12-26 MED ORDER — CHLORHEXIDINE GLUCONATE CLOTH 2 % EX PADS
6.0000 | MEDICATED_PAD | Freq: Once | CUTANEOUS | Status: AC
Start: 2020-12-26 — End: 2020-12-26
  Administered 2020-12-26: 6 via TOPICAL

## 2020-12-26 MED ORDER — TRANEXAMIC ACID (OHS) BOLUS VIA INFUSION
15.0000 mg/kg | INTRAVENOUS | Status: AC
  Administered 2020-12-27: 874.5 mg via INTRAVENOUS
  Filled 2020-12-26: qty 875

## 2020-12-26 MED ORDER — INSULIN REGULAR(HUMAN) IN NACL 100-0.9 UT/100ML-% IV SOLN
INTRAVENOUS | Status: AC
  Administered 2020-12-27: 1.7 [IU]/h via INTRAVENOUS
  Filled 2020-12-26: qty 100

## 2020-12-26 NOTE — Progress Notes (Addendum)
Progress Note  Patient Name: [name] Date of Encounter: 12/26/2020  [facility] Cardiologist: Laurance Flatten, MD  Patient Profile     55 y.o. male with prior history of DM-2 (on insulin), HTN, HLD, moderate bilateral carotid artery disease, and chronic tobacco use seen by Dr. Shari Prows for dizziness and exertional dyspnea.  During initial consultation, patient also noted having intermittent chest pain/aching most notable when he was "working-out".  On occasion, he also woke up with chest pain.  Because of abnormal EKG with T wave inversions and high risk features, he was referred for cardiac catheterization on 12/24/2020.  Found to have multivessel disease and referred for CABG.  Subjective   Has a little bit of twinging chest discomfort and a headache right now.  A little bit drowsy.  Mostly just seems anxious about his upcoming surgery  Assessment & Plan    Principal Problem:   Coronary artery disease involving native coronary artery of native heart with unstable angina pectoris (HCC) Active Problems:   Diabetes mellitus, type II, insulin dependent (HCC)   Hyperlipidemia associated with type 2 diabetes mellitus (HCC)   Essential hypertension   Tobacco use   Unstable angina (HCC) Borderline hypokalemia  Principal Problem:   Coronary artery disease involving native coronary artery of native heart with unstable angina pectoris (HCC) / Unstable angina (HCC) -> multivessel CAD on Cath - CVTS consulted for CABG -> tentatively scheduled for tomorrow morning (12/28/2018 Converted from atorvastatin to rosuvastatin which were increased to 40 mg yesterday. With low blood pressure, ACE inhibitor and carvedilol held/discontinued.  We will convert from carvedilol to Lopressor low-dose. Continue aspirin. If he continues to have chest discomfort would probably start IV heparin pending CABG    Diabetes mellitus, type II, insulin dependent (HCC) / Hyperlipidemia associated with type  2 diabetes mellitus (HCC) With multivessel disease, increased statin dose to 40 mg rosuvastatin. On glipizide and SSI plus Lantus.  Anticipate glipizide being held perioperatively. Restart home insulin regimen on discharge Consider SGLT2 inhibitor on discharge ACE inhibitor on hold because of hypotension    Essential hypertension low blood pressure today.  Holding ACE inhibitor and beta-blocker.  Changing from carvedilol to Lopressor.    Tobacco use -> on BuSpar.  Consider NicoDerm patch    Potassium level somewhat trended down with diuresis.  We will give some oral potassium but also administer some IV to allow for potassium and 500 MLS bolus.  Inpatient Medications    Scheduled Meds:  aspirin  81 mg Oral Daily   busPIRone  10 mg Oral BID   DULoxetine  60 mg Oral q AM   [START ON 12/27/2020] epinephrine  0-10 mcg/min Intravenous To OR   gabapentin  300 mg Oral TID   glipiZIDE  10 mg Oral BID WC   [START ON 12/27/2020] heparin-papaverine-plasmalyte irrigation   Irrigation To OR   insulin aspart  0-15 Units Subcutaneous TID WC   insulin aspart  0-5 Units Subcutaneous QHS   insulin glargine  10 Units Subcutaneous Daily   [START ON 12/27/2020] insulin   Intravenous To OR   [facility] Blood Cardioplegia vial (lidocaine/magnesium/mannitol 0.26g-4g-6.4g)   Intracoronary Once   pantoprazole  40 mg Oral Daily   [START ON 12/27/2020] phenylephrine  30-200 mcg/min Intravenous To OR   [START ON 12/27/2020] potassium chloride  80 mEq Other To OR   potassium chloride  40 mEq Oral Q6H   rOPINIRole  0.25 mg Oral QHS   rosuvastatin  40 mg Oral Daily  sodium chloride flush  3 mL Intravenous Q12H   sodium chloride flush  3 mL Intravenous Q12H   topiramate  50 mg Oral BID   [START ON 12/27/2020] tranexamic acid  15 mg/kg Intravenous To OR   [START ON 12/27/2020] tranexamic acid  2 mg/kg Intracatheter To OR   Continuous Infusions:  sodium chloride     [START ON 12/27/2020]  ceFAZolin (ANCEF) IV      [START ON 12/27/2020]  ceFAZolin (ANCEF) IV     [START ON 12/27/2020] dexmedetomidine     [START ON 12/27/2020] heparin 30,000 units/NS 1000 mL solution for CELLSAVER     [START ON 12/27/2020] milrinone     [START ON 12/27/2020] nitroGLYCERIN     [START ON 12/27/2020] nitroGLYCERIN     [START ON 12/27/2020] norepinephrine     [START ON 12/27/2020] tranexamic acid (CYKLOKAPRON) infusion ([facility])     [START ON 12/27/2020] vancomycin     PRN Meds: sodium chloride, acetaminophen, ondansetron (ZOFRAN) IV, sodium chloride flush   Vital Signs    Vitals:   12/26/20 0400 12/26/20 0640 12/26/20 0801 12/26/20 0914  BP: (!) 74/46 (!) 76/48 116/82 (!) 90/57  Pulse:  89 95   Resp:   18 16  Temp:      TempSrc:      SpO2:   93% 100%  Weight:      Height:        Intake/Output Summary (Last 24 hours) at 12/26/2020 1152 Last data filed at 12/26/2020 0815 Gross per 24 hour  Intake 1797 ml  Output --  Net 1797 ml   Last 3 Weights 12/26/2020 12/25/2020 12/24/2020  Weight (lbs) 128 lb 8 oz 127 lb 11.2 oz 129 lb 12.8 oz  Weight (kg) 58.287 kg 57.924 kg 58.877 kg      Telemetry    Remains in sinus rhythm with occasional PVCs- Personally Reviewed  ECG    No new study- Personally Reviewed  Physical Exam   GEN: Resting comfortably in bed.  No obvious distress.  Just seems a little bit depressed.  Has a headache.  No JVD or bruit. Neck: No JVD or carotid bruit. Cardiac: RRR with normal S1 and S2.  No M/R/G Respiratory: CTA B, nonlabored, good air movement. GI: Soft/NT/ND/NABS.  No HSM. MS: No C/C/E. Neuro: A&O x3.  Nonfocal. Psych: Somewhat flat-dysthymic affect   Labs    High Sensitivity Troponin:  No results for input(s): TROPONINIHS in the last 720 hours.    Chemistry Recent Labs  Lab 12/25/20 0151 12/25/20 2135 12/26/20 0455  NA 140 133* 139  K 4.0 3.8 3.3*  CL 107 103 109  CO2 27 21* 22  GLUCOSE 220* 556* 128*  BUN 14 21* 28*  CREATININE 0.73 1.06 1.44*  CALCIUM 9.7 9.1 9.4   GFRNONAA >60 >60 57*  ANIONGAP 6 9 8      Hematology Recent Labs  Lab 12/25/20 0151  WBC 8.2  RBC 4.69  HGB 13.8  HCT 41.0  MCV 87.4  MCH 29.4  MCHC 33.7  RDW 13.1  PLT 236    BNPNo results for input(s): BNP, PROBNP in the last 168 hours.   DDimer No results for input(s): DDIMER in the last 168 hours.   Radiology/Cardiology Studies     CARDIAC CATHETERIZATION  Result Date: 12/24/2020  Prox RCA lesion is 100% stenosed.  Ost Cx to Prox Cx lesion is 70% stenosed.  Dist Cx lesion is 99% stenosed.  1st Mrg lesion is 70% stenosed.  Prox  LAD to Mid LAD lesion is 70% stenosed.  1st Diag lesion is 70% stenosed.  Mid LAD lesion is 50% stenosed.  1. Severe three vessel CAD 2. Severe proximal to mid LAD stenosis. Severe stenosis in the moderate caliber first diagonal branch. 3. Severe stenosis proximal Circumflex and distal AV groove Circumflex. Severe stenosis first obtuse marginal branch. 4. Chronic occlusion of the dominant RCA. The PDA fills from left to right collaterals. Recommendations: He has been having daily angina. Will admit to telemetry. Echo to assess LV function. CT surgery consult for CABG.    ECHOCARDIOGRAM COMPLETE IMPRESSIONS  1. Left ventricular ejection fraction, by estimation, is 55 to 60%. The left ventricle has normal function. The left ventricle has no regional wall motion abnormalities. Left ventricular diastolic parameters are consistent with Grade I diastolic dysfunction (impaired relaxation).  2. Right ventricular systolic function is normal. The right ventricular size is normal.  3. The mitral valve is normal in structure. No evidence of mitral valve regurgitation. No evidence of mitral stenosis.  4. The aortic valve was not well visualized. Aortic valve regurgitation is not visualized. No aortic stenosis is present.  5. The inferior vena cava is normal in size with greater than 50% respiratory variability, suggesting right atrial pressure of 3 mmHg.  Comparison(s): No prior Echocardiogram.   VAS US DOPPLER PRE CABG  Result Date: 12/24/2020 PREOPERATIVE VASCULAR EVALUATION Patient Name:  [name]  Date of Exam:   12/24/2020 Medical Rec #: [id number]       Accession #:    [id number] Date of Birth: 10/28/1965      Patient Gender: M Patient Age:   78Y Exam Location:  [facility] Procedure:      VAS US DOPPLER PRE CABG Referring Phys: [id number] BROADUS Z ATKINS --------------------------------------------------------------------------------  Indications:      Pre-CABG. Risk Factors:     Diabetes, current smoker, coronary artery disease. Other Factors:    Patein had CTA of neck indicating 50% right ICA stenosis and                   <50% right ICA stenosis, 09/20/2020. Comparison Study: No prior studies. Performing Technologist: Sherren Kerns RVS  Examination Guidelines: A complete evaluation includes B-mode imaging, spectral Doppler, color Doppler, and power Doppler as needed of all accessible portions of each vessel. Bilateral testing is considered an integral part of a complete examination. Limited examinations for reoccurring indications may be performed as noted.  ABI Findings: +---------+------------------+-----+-------------------+--------+ Right    Rt Pressure (mmHg)IndexWaveform           Comment  +---------+------------------+-----+-------------------+--------+ Brachial 129                    multiphasic                 +---------+------------------+-----+-------------------+--------+ PTA                             absent                      +---------+------------------+-----+-------------------+--------+ DP       67                0.52 dampened monophasic         +---------+------------------+-----+-------------------+--------+ Great Toe30                0.23                             +---------+------------------+-----+-------------------+--------+  +---------+------------------+-----+-------------------+------------------+  Left     Lt Pressure (mmHg)IndexWaveform           Comment            +---------+------------------+-----+-------------------+------------------+ Brachial                                           IV, bandages, tape +---------+------------------+-----+-------------------+------------------+ PTA                             absent                                +---------+------------------+-----+-------------------+------------------+ DP       79                0.61 dampened monophasic                   +---------+------------------+-----+-------------------+------------------+ Great Toe62                0.48                                       +---------+------------------+-----+-------------------+------------------+ +-------+---------------+----------------+ ABI/TBIToday's ABI/TBIPrevious ABI/TBI +-------+---------------+----------------+ Right  0.52                            +-------+---------------+----------------+ Left   0.61                            +-------+---------------+----------------+  Right Doppler Findings: +--------+--------+-----+-----------+--------+ Site    PressureIndexDoppler    Comments +--------+--------+-----+-----------+--------+ [id number]          multiphasic         +--------+--------+-----+-----------+--------+ Radial               multiphasic         +--------+--------+-----+-----------+--------+ Ulnar                multiphasic         +--------+--------+-----+-----------+--------+  Left Doppler Findings: +--------+--------+-----+-----------+------------------+ Site    PressureIndexDoppler    Comments           +--------+--------+-----+-----------+------------------+ Brachial                        IV, bandages, tape +--------+--------+-----+-----------+------------------+ Radial               multiphasic                    +--------+--------+-----+-----------+------------------+ Ulnar                multiphasic                   +--------+--------+-----+-----------+------------------+  Right ABI: Resting right ankle-brachial index indicates moderate right lower extremity arterial disease. The right toe-brachial index is abnormal. Left ABI: Resting left ankle-brachial index indicates moderate left lower extremity arterial disease. The left toe-brachial index is abnormal. Right Upper Extremity: Doppler waveforms remain within normal limits with right radial compression. Doppler waveforms remain within normal limits with right ulnar compression. Left Upper Extremity: Doppler waveforms remain within normal limits with left radial compression. Doppler waveform obliterate with  left ulnar compression.     Preliminary       For questions or updates, please contact [facility] Please consult [email or web address] for contact info under        Signed, Bryan Lemma, MD  12/26/2020, 11:52 AM

## 2020-12-26 NOTE — Consult Note (Signed)
[address]       [place] [id number]             [phone]        Arta SilenceBobby Terrio [facility] Medical Record #[id number] Date of Birth: 1965-08-11  Referring: Verne Carrowhristopher McAlhany, MD Primary Care: [name], [name], NP   Chief Complaint:   Dizziness   History of Present Illness:     [name] is a 55 year old male with a past history notable for regular daily tobacco use, type 2 diabetes mellitus, dyslipidemia,  hypertension and family history of premature coronary artery disease.Marland Kitchen.  He presented to the emergency room in April of this year with a primary complaint of dizziness and during the course of work-up for this complaint, he had an EKG that inferior T wave inversions.  High-sensitivity troponin at that same encounter was reported as normal but a referral was made to cardiology for further evaluation.  He was seen by Dr. Laurance FlattenHeather Pemberton on 12/10/2020 and at that time described having some intermittent chest pain with associated tightness in his chest and bilateral upper extremities.  Left heart catheterization was recommended for further evaluation and was carried out electively on 12/24/2020.  Coronary angiography demonstrated severe three-vessel coronary artery disease with a long proximal 70% LAD stenosis, proximal 70% first diagonal stenosis, 70% proximal circumflex and OM stenoses and total occlusion of the right coronary artery with left-to-right collaterals.  An echocardiogram obtained on the same day showed left ventricular ejection fraction 55 to 60%, normal RV function, and no evidence of structural or functional valvular disease.  [name] was admitted to the hospital and CT surgery consultation was requested for consideration of operative coronary revascularization. [name] is currently sitting up in bed preparing to eat lunch.  He denies having any chest pain or dizziness currently but had a headache earlier today which is common for him.  He  is right-hand dominant.  He is unemployed.  He reports he tried to "get on disability" but was denied and gets SSI funds each month instead.  He is married and lives with his wife.  He was discouraged that it took him 3 months to get an appointment with the cardiology service for evaluation of his symptoms.      Current Activity/ Functional Status: Patient is independent with mobility/ambulation, transfers, ADL's, IADL's.   Zubrod Score: At the time of surgery this patient's most appropriate activity status/level should be described as: []     0    Normal activity, no symptoms [x]     1    Restricted in physical strenuous activity but ambulatory, able to do out light work []     2    Ambulatory and capable of self care, unable to do work activities, up and about                 more than 50%  Of the time                            []     3    Only limited self care, in bed greater than 50% of waking hours []     4    Completely disabled, no self care, confined to bed or chair []     5    Moribund  Past Medical History:  Diagnosis Date   Anginal pain (HCC)    Ataxia    CAD (coronary  artery disease)    Diabetes mellitus without complication (HCC)    Dizziness    Hyperlipidemia    Hypertension    Near syncope     Past Surgical History:  Procedure Laterality Date   CARDIAC CATHETERIZATION  12/24/2020   LEFT HEART CATH AND CORONARY ANGIOGRAPHY N/A 12/24/2020   Procedure: LEFT HEART CATH AND CORONARY ANGIOGRAPHY;  Surgeon: Kathleene Hazel, MD;  Location: [facility];  Service: Cardiovascular;  Laterality: N/A;    Social History   Tobacco Use  Smoking Status Every Day   Pack years: 0.00   Types: Cigarettes  Smokeless Tobacco Never    Social History   Substance and Sexual Activity  Alcohol Use None     Allergies  Allergen Reactions   Atorvastatin Other (See Comments)    Myalgias     Current Facility-Administered Medications  Medication Dose Route Frequency  Provider Last Rate Last Admin   0.9 %  sodium chloride infusion  250 mL Intravenous PRN Kathleene Hazel, MD       acetaminophen (TYLENOL) tablet 650 mg  650 mg Oral Q4H PRN Kathleene Hazel, MD   650 mg at 12/25/20 2014   aspirin chewable tablet 81 mg  81 mg Oral Daily Kathleene Hazel, MD   81 mg at 12/26/20 0915   busPIRone (BUSPAR) tablet 10 mg  10 mg Oral BID Kathleene Hazel, MD   10 mg at 12/26/20 0916   [START ON 12/27/2020] ceFAZolin (ANCEF) IVPB 2g/100 mL premix  2 g Intravenous To OR Corliss Skains, MD       [START ON 12/27/2020] ceFAZolin (ANCEF) IVPB 2g/100 mL premix  2 g Intravenous To OR Lightfoot, Eliezer Lofts, MD       [START ON 12/27/2020] dexmedetomidine (PRECEDEX) 400 MCG/100ML (4 mcg/mL) infusion  0.1-0.7 mcg/kg/hr Intravenous To OR Lightfoot, Harrell O, MD       DULoxetine (CYMBALTA) DR capsule 60 mg  60 mg Oral q AM Kathleene Hazel, MD   60 mg at 12/26/20 0637   [START ON 12/27/2020] EPINEPHrine (ADRENALIN) 5 mg in NS 250 mL (0.02 mg/mL) premix infusion  0-10 mcg/min Intravenous To OR Lightfoot, Harrell O, MD       gabapentin (NEURONTIN) capsule 300 mg  300 mg Oral TID Kathleene Hazel, MD   300 mg at 12/26/20 0916   glipiZIDE (GLUCOTROL) tablet 10 mg  10 mg Oral BID WC Kathleene Hazel, MD   10 mg at 12/26/20 0916   [START ON 12/27/2020] heparin 30,000 units/NS 1000 mL solution for CELLSAVER   Other To OR Corliss Skains, MD       [START ON 12/27/2020] heparin sodium (porcine) 5,000 Units, papaverine 60 mg in electrolyte-A (PLASMALYTE-A PH 7.4) 1,000 mL irrigation   Irrigation To OR Lightfoot, Harrell O, MD       insulin aspart (novoLOG) injection 0-15 Units  0-15 Units Subcutaneous TID WC Precious Reel, MD       insulin aspart (novoLOG) injection 0-5 Units  0-5 Units Subcutaneous QHS Precious Reel, MD   5 Units at 12/25/20 2140   insulin glargine (LANTUS) injection 10 Units  10 Units Subcutaneous Daily  Kathleene Hazel, MD   10 Units at 12/26/20 0919   [START ON 12/27/2020] insulin regular, human (MYXREDLIN) 100 units/ 100 mL infusion   Intravenous To OR Lightfoot, Eliezer Lofts, MD       [facility] Blood Cardioplegia vial (lidocaine/magnesium/mannitol 0.26g-4g-6.4g)   Intracoronary Once [place],  Eliezer Lofts, MD       lisinopril (ZESTRIL) tablet 10 mg  10 mg Oral q AM Kathleene Hazel, MD   10 mg at 12/25/20 0646   [START ON 12/27/2020] milrinone (PRIMACOR) 20 MG/100 ML (0.2 mg/mL) infusion  0.3 mcg/kg/min Intravenous To OR Corliss Skains, MD       [START ON 12/27/2020] nitroGLYCERIN 50 mg in dextrose 5 % 250 mL (0.2 mg/mL) infusion  2-200 mcg/min Intravenous To OR Linden Dolin, MD       [START ON 12/27/2020] nitroGLYCERIN 50 mg in dextrose 5 % 250 mL (0.2 mg/mL) infusion  2-200 mcg/min Intravenous To OR Corliss Skains, MD       [START ON 12/27/2020] norepinephrine (LEVOPHED) 4mg  in premix infusion  0-40 mcg/min Intravenous To OR Lightfoot, , MD       ondansetron (ZOFRAN) injection 4 mg  4 mg Intravenous Q6H PRN Eliezer Lofts, MD       pantoprazole (PROTONIX) EC tablet 40 mg  40 mg Oral Daily Kathleene Hazel, MD   40 mg at 12/26/20 0915   [START ON 12/27/2020] phenylephrine (NEOSYNEPHRINE) 20-0.9 MG/250ML-% infusion  30-200 mcg/min Intravenous To OR 12/29/2020, MD       [START ON 12/27/2020] potassium chloride injection 80 mEq  80 mEq Other To OR Lightfoot, 12/29/2020, MD       rOPINIRole (REQUIP) tablet 0.25 mg  0.25 mg Oral QHS Eliezer Lofts, MD   0.25 mg at 12/25/20 2042   rosuvastatin (CRESTOR) tablet 40 mg  40 mg Oral Daily 2043, MD   40 mg at 12/26/20 0915   sodium chloride flush (NS) 0.9 % injection 3 mL  3 mL Intravenous Q12H 12/28/20, MD   3 mL at 12/26/20 0919   sodium chloride flush (NS) 0.9 % injection 3 mL  3 mL Intravenous Q12H 12/28/20, MD   3 mL at 12/26/20 0918   sodium  chloride flush (NS) 0.9 % injection 3 mL  3 mL Intravenous PRN 12/28/20, MD       topiramate (TOPAMAX) tablet 50 mg  50 mg Oral BID Kathleene Hazel, MD   50 mg at 12/26/20 0916   [START ON 12/27/2020] tranexamic acid (CYKLOKAPRON) 2,500 mg in sodium chloride 0.9 % 250 mL (10 mg/mL) infusion  1.5 mg/kg/hr Intravenous To OR 12/29/2020, MD       [START ON 12/27/2020] tranexamic acid (CYKLOKAPRON) bolus via infusion - over 30 minutes 874.5 mg  15 mg/kg Intravenous To OR 12/29/2020, MD       [START ON 12/27/2020] tranexamic acid (CYKLOKAPRON) pump prime solution 117 mg  2 mg/kg Intracatheter To OR 12/29/2020, MD       [START ON 12/27/2020] vancomycin (VANCOREADY) IVPB 1250 mg/250 mL  1,250 mg Intravenous To OR Lightfoot, 12/29/2020, MD        Medications Prior to Admission  Medication Sig Dispense Refill Last Dose   aspirin 81 MG chewable tablet Chew 1 tablet (81 mg total) by mouth daily. (Patient taking differently: Chew 81 mg by mouth in the morning.) 30 tablet 1 12/24/2020 at 0600   busPIRone (BUSPAR) 10 MG tablet Take 10 mg by mouth 2 (two) times daily.   12/24/2020   carvedilol (COREG) 3.125 MG tablet Take 3.125 mg by mouth 2 (two) times daily.   12/24/2020 at 0600   CYMBALTA 60 MG capsule Take 60 mg  by mouth in the morning.   12/24/2020   diphenhydrAMINE (BENADRYL) 25 MG tablet Take 25 mg by mouth in the morning.   12/24/2020 at 0600   gabapentin (NEURONTIN) 300 MG capsule Take 300 mg by mouth 3 (three) times daily.   12/24/2020   glipiZIDE (GLUCOTROL) 10 MG tablet Take 10 mg by mouth 2 (two) times daily.   12/23/2020   Insulin Glargine (BASAGLAR KWIKPEN) 100 UNIT/ML Inject 10 Units into the skin in the morning.   12/23/2020   lisinopril (ZESTRIL) 10 MG tablet Take 10 mg by mouth in the morning.   12/24/2020   metFORMIN (GLUCOPHAGE) 1000 MG tablet Take 500 mg by mouth 2 (two) times daily.   12/23/2020   omeprazole (PRILOSEC) 40 MG capsule Take 40 mg by mouth  in the morning.   12/24/2020   rOPINIRole (REQUIP) 0.25 MG tablet Take 0.25 mg by mouth at bedtime.   12/23/2020   rosuvastatin (CRESTOR) 20 MG tablet Take 1 tablet (20 mg total) by mouth daily. (Patient taking differently: Take 20 mg by mouth in the morning.) 90 tablet 1 12/24/2020   topiramate (TOPAMAX) 50 MG tablet Take 1 tablet (50 mg total) by mouth 2 (two) times daily. 60 tablet 12 12/24/2020   Vitamin D, Ergocalciferol, (DRISDOL) 1.25 MG (50000 UNIT) CAPS capsule Take 50,000 Units by mouth every Friday.   12/24/2020    History of early coronary disease in his father.   Review of Systems:   ROS     Cardiac Review of Systems: Y or  [    ]= no  Chest Pain [  x  ]  Resting SOB [ x  ] Exertional SOB  [  ]  Orthopnea [  ]   Pedal Edema [   ]    Palpitations [  ] Syncope  [  ]   Presyncope [   ]  General Review of Systems: [Y] = yes [  ]=no Constitional: recent weight change [  ]; anorexia [  ]; fatigue [  ]; nausea [  ]; night sweats [  ]; fever [  ]; or chills [  ]                                                               Dental: has one remaining tooth right lower jaw.  Eye : blurred vision [  ]; diplopia [   ]; vision changes [  ];  Amaurosis fugax[  ]; Resp: cough [  ];  wheezing[  ];  hemoptysis[  ]; shortness of breath[  ]; paroxysmal nocturnal dyspnea[  ]; dyspnea on exertion[  ]; or orthopnea[  ];  GI:  gallstones[  ], vomiting[  ];  dysphagia[  ]; melena[  ];  hematochezia [  ]; heartburn[  ];   Hx of  Colonoscopy[  ]; GU: kidney stones [  ]; hematuria[  ];   dysuria [  ];  nocturia[  ];  history of     obstruction [  ]; urinary frequency [  ]             Skin: rash, swelling[  ];, hair loss[  ];  peripheral edema[  ];  or itching[  ]; Musculosketetal: myalgias[  ];  joint swelling[  ];  joint erythema[  ];  joint pain[  ];  back pain[  ];  Heme/Lymph: bruising[  ];  bleeding[  ];  anemia[  ];  Neuro: TIA[  ];  headaches[  ];  stroke[  ];  vertigo[  ];  seizures[  ];    paresthesias[  ];  difficulty walking[  ];  Psych:depression[  ]; anxiety[  ];  Endocrine: diabetes[ x ];  thyroid dysfunction[  ];                            Physical Exam: BP (!) 90/57 (BP Location: Left Arm)   Pulse 95   Temp 98.4 F (36.9 C) (Oral)   Resp 16   Ht  (1.702 m)   Wt 58.3 kg   SpO2 100%   BMI 20.13 kg/m    General appearance: alert, cooperative, and no distress Head: Normocephalic, without obvious abnormality, atraumatic Neck: no adenopathy, no JVD, supple, symmetrical, trachea midline, and there is a left carotid bruit Lymph nodes: No cervical or clavicular adenopathy Resp: Breath sounds are shallow but clear. Cardio: Regular rate and rhythm, no murmur. GI: Soft and nontender, active bowel sounds Extremities: All are warm and well-perfused.  Negative Allen's test to both upper extremities.  Radial pulses are easily palpable.  Posterior tibial pulses are faint.  The veins in his lower extremities are quite prominent, there is no obvious varicosity. Neurologic: Grossly normal  Diagnostic Studies & Laboratory data:    LEFT HEART CATH AND CORONARY ANGIOGRAPHY    Conclusion    Prox RCA lesion is 100% stenosed. Ost Cx to Prox Cx lesion is 70% stenosed. Dist Cx lesion is 99% stenosed. 1st Mrg lesion is 70% stenosed. Prox LAD to Mid LAD lesion is 70% stenosed. 1st Diag lesion is 70% stenosed. Mid LAD lesion is 50% stenosed.   1. Severe three vessel CAD 2. Severe proximal to mid LAD stenosis. Severe stenosis in the moderate caliber first diagonal branch. 3. Severe stenosis proximal Circumflex and distal AV groove Circumflex. Severe stenosis first obtuse marginal branch. 4. Chronic occlusion of the dominant RCA. The PDA fills from left to right collaterals.   Recommendations: He has been having daily angina. Will admit to telemetry. Echo to assess LV function. CT surgery consult for CABG.    Coronary Findings   Diagnostic Dominance: Right  Left  Anterior Descending  Vessel is large.  Prox LAD to Mid LAD lesion is 70% stenosed.  Mid LAD lesion is 50% stenosed.  First Diagonal Branch  Vessel is moderate in size.  1st Diag lesion is 70% stenosed.  Left Circumflex  Vessel is large.  Ost Cx to Prox Cx lesion is 70% stenosed.  Dist Cx lesion is 99% stenosed.  First Obtuse Marginal Branch  Vessel is large in size.  1st Mrg lesion is 70% stenosed.  Right Coronary Artery  Vessel is large.  Prox RCA lesion is 100% stenosed. The lesion is chronically occluded.  Third Right Posterolateral Branch  Collaterals  3rd RPL filled by collaterals from 3rd Mrg.     Intervention   No interventions have been documented.  Coronary Diagrams   Diagnostic Dominance: Right         ECHOCARDIOGRAM COMPLETE  Patient Name:   [name] Date of Exam: 12/24/2020  Medical Rec #:  [id number]      Height:       67.0 in  Accession #:    [id number]  Weight:       132.0 lb  Date of Birth:  25-Aug-1965     BSA:          1.695 m  Patient Age:    54 years       BP:           96/48 mmHg  Patient Gender: M              HR:           85 bpm.  Exam Location:  Inpatient   Procedure: 2D Echo, 3D Echo, Cardiac Doppler and Color Doppler   Indications:    I25.110 Atherosclerotic heart disease of native coronary  artery                  with unstable angina pectoris     History:        Patient has no prior history of Echocardiogram  examinations.                  CAD; Signs/Symptoms:Chest Pain.     Sonographer:    Sheralyn Boatman RDCS  Referring Phys: [id number] [facility] Z ATKINS      Sonographer Comments: Technically difficult study due to poor echo windows  and suboptimal parasternal window. Patient supine post cath procedure.  IMPRESSIONS     1. Left ventricular ejection fraction, by estimation, is 55 to 60%. The  left ventricle has normal function. The left ventricle has no regional  wall motion abnormalities. Left ventricular diastolic  parameters are consistent with Grade I diastolic  dysfunction (impaired relaxation).   2. Right ventricular systolic function is normal. The right ventricular size is normal.   3. The mitral valve is normal in structure. No evidence of mitral valve  regurgitation. No evidence of mitral stenosis.   4. The aortic valve was not well visualized. Aortic valve regurgitation  is not visualized. No aortic stenosis is present.   5. The inferior vena cava is normal in size with greater than 50%  respiratory variability, suggesting right atrial pressure of 3 mmHg.   Comparison(s): No prior Echocardiogram.   FINDINGS   Left Ventricle: Left ventricular ejection fraction, by estimation, is 55  to 60%. The left ventricle has normal function. The left ventricle has no  regional wall motion abnormalities. 3D left ventricular ejection fraction  analysis performed but not  reported based on interpreter judgement due to suboptimal quality. The  left ventricular internal cavity size was normal in size. There is no left  ventricular hypertrophy. Left ventricular diastolic parameters are  consistent with Grade I diastolic  dysfunction (impaired relaxation).   Right Ventricle: The right ventricular size is normal. No increase in  right ventricular wall thickness. Right ventricular systolic function is  normal.   Left Atrium: Left atrial size was normal in size.   Right Atrium: Right atrial size was normal in size.   Pericardium: There is no evidence of pericardial effusion.   Mitral Valve: The mitral valve is normal in structure. There is mild  thickening of the mitral valve leaflet(s). There is mild calcification of the mitral valve leaflet(s). Mild mitral annular calcification. No  evidence of mitral valve regurgitation. No  evidence of mitral valve stenosis.   Tricuspid Valve: The tricuspid valve is normal in structure. Tricuspid valve regurgitation is trivial.   Aortic Valve: The aortic valve was  not well visualized. Aortic valve regurgitation is not visualized. No aortic stenosis is present.   Pulmonic Valve: The  pulmonic valve was not well visualized. Pulmonic valve regurgitation is trivial.   Aorta: The aortic root was not well visualized.   Venous: The inferior vena cava is normal in size with greater than 50%  respiratory variability, suggesting right atrial pressure of 3 mmHg.   IAS/Shunts: No atrial level shunt detected by color flow Doppler.      LEFT VENTRICLE  PLAX 2D  LVIDd:         3.70 cm     Diastology  LVIDs:         2.50 cm     LV e' medial:    8.59 cm/s  LV PW:         0.80 cm     LV E/e' medial:  9.0  LV IVS:        1.10 cm     LV e' lateral:   13.60 cm/s  LVOT diam:     1.70 cm     LV E/e' lateral: 5.7  LV SV:         37  LV SV Index:   22  LVOT Area:     2.27 cm                                3D Volume EF:  LV Volumes (MOD)           3D EF:        59 %  LV vol d, MOD A2C: 43.3 ml LV EDV:       86 ml  LV vol d, MOD A4C: 61.0 ml LV ESV:       35 ml  LV vol s, MOD A2C: 17.1 ml LV SV:        50 ml  LV vol s, MOD A4C: 25.1 ml  LV SV MOD A2C:     26.2 ml  LV SV MOD A4C:     61.0 ml  LV SV MOD BP:      31.0 ml   RIGHT VENTRICLE             IVC  RV S prime:     12.40 cm/s  IVC diam: 1.40 cm  TAPSE (M-mode): 1.8 cm   LEFT ATRIUM             Index      RIGHT ATRIUM          Index  LA diam:        2.20 cm 1.30 cm/m RA Area:     4.42 cm  LA Vol (A2C):   12.8 ml 7.55 ml/m RA Volume:   4.95 ml  2.92 ml/m  LA Vol (A4C):   12.7 ml 7.49 ml/m  LA Biplane Vol: 12.8 ml 7.55 ml/m   AORTIC VALVE  LVOT Vmax:   104.00 cm/s  LVOT Vmean:  65.500 cm/s  LVOT VTI:    0.165 m     AORTA  Ao Root diam: 2.50 cm  Ao Desc diam: 2.50 cm   MITRAL VALVE  MV Area (PHT): 3.99 cm    SHUNTS  MV Decel Time: 190 msec    Systemic VTI:  0.16 m  MV E velocity: 77.00 cm/s  Systemic Diam: 1.70 cm  MV A velocity: 77.60 cm/s  MV E/A ratio:  0.99   Laurance Flatten MD   Electronically signed by Laurance Flatten MD  Signature Date/Time: 12/24/2020/4:29:26 PM  VAS US DOPPLER PRE CABG  Result Date: 12/25/2020 PREOPERATIVE VASCULAR EVALUATION Patient Name:  [name]  Date of Exam:   12/24/2020 Medical Rec #: [id number]       Accession #:    [id number] Date of Birth: 1966-01-20      Patient Gender: M Patient Age:   61Y Exam Location:  [facility] Procedure:      VAS US DOPPLER PRE CABG Referring Phys: [id number] BR[facility] Z ATKINS --------------------------------------------------------------------------------  Indications:      Pre-CABG. Risk Factors:     Diabetes, current smoker, coronary artery disease. Other Factors:    Patein had CTA of neck indicating 50% right ICA stenosis and                   <50% right ICA stenosis, 09/20/2020. Comparison Study: No prior studies. Performing Technologist: Sherren Kerns RVS  Examination Guidelines: A complete evaluation includes B-mode imaging, spectral Doppler, color Doppler, and power Doppler as needed of all accessible portions of each vessel. Bilateral testing is considered an integral part of a complete examination. Limited examinations for reoccurring indications may be performed as noted.  ABI Findings: +---------+------------------+-----+-------------------+--------+ Right    Rt Pressure (mmHg)IndexWaveform           Comment  +---------+------------------+-----+-------------------+--------+ Brachial 129                    multiphasic                 +---------+------------------+-----+-------------------+--------+ PTA                             absent                      +---------+------------------+-----+-------------------+--------+ DP       67                0.52 dampened monophasic         +---------+------------------+-----+-------------------+--------+ Great Toe30                0.23                             +---------+------------------+-----+-------------------+--------+  +---------+------------------+-----+-------------------+------------------+ Left     Lt Pressure (mmHg)IndexWaveform           Comment            +---------+------------------+-----+-------------------+------------------+ Brachial                                           IV, bandages, tape +---------+------------------+-----+-------------------+------------------+ PTA                             absent                                +---------+------------------+-----+-------------------+------------------+ DP       79                0.61 dampened monophasic                   +---------+------------------+-----+-------------------+------------------+ Great Toe62                0.48                                       +---------+------------------+-----+-------------------+------------------+ +-------+---------------+----------------+  ABI/TBIToday's ABI/TBIPrevious ABI/TBI +-------+---------------+----------------+ Right  0.52                            +-------+---------------+----------------+ Left   0.61                            +-------+---------------+----------------+  Right Doppler Findings: +--------+--------+-----+-----------+--------+ Site    PressureIndexDoppler    Comments +--------+--------+-----+-----------+--------+ [id number]          multiphasic         +--------+--------+-----+-----------+--------+ Radial               multiphasic         +--------+--------+-----+-----------+--------+ Ulnar                multiphasic         +--------+--------+-----+-----------+--------+  Left Doppler Findings: +--------+--------+-----+-----------+------------------+ Site    PressureIndexDoppler    Comments           +--------+--------+-----+-----------+------------------+ Brachial                        IV, bandages, tape +--------+--------+-----+-----------+------------------+ Radial               multiphasic                    +--------+--------+-----+-----------+------------------+ Ulnar                multiphasic                   +--------+--------+-----+-----------+------------------+  Right ABI: Resting right ankle-brachial index indicates moderate right lower extremity arterial disease. The right toe-brachial index is abnormal. Left ABI: Resting left ankle-brachial index indicates moderate left lower extremity arterial disease. The left toe-brachial index is abnormal. Right Upper Extremity: Doppler waveforms remain within normal limits with right radial compression. Doppler waveforms remain within normal limits with right ulnar compression. Left Upper Extremity: Doppler waveforms remain within normal limits with left radial compression. Doppler waveform obliterate with left ulnar compression.  Electronically signed by Fabienne Bruns MD on 12/25/2020 at 5:11:07 PM.    Final      I have independently reviewed the above radiologic studies and discussed with the patient   Recent Lab Findings: Lab Results  Component Value Date   WBC 8.2 12/25/2020   HGB 13.8 12/25/2020   HCT 41.0 12/25/2020   PLT 236 12/25/2020   GLUCOSE 128 (H) 12/26/2020   CHOL 130 12/26/2020   TRIG 107 12/26/2020   HDL 41 12/26/2020   LDLCALC 68 12/26/2020   NA 139 12/26/2020   K 3.3 (L) 12/26/2020   CL 109 12/26/2020   CREATININE 1.44 (H) 12/26/2020   BUN 28 (H) 12/26/2020   CO2 22 12/26/2020   INR 1.0 09/08/2008   HGBA1C 9.9 (H) 12/26/2020      Assessment / Plan:    -55 year old male with atypical angina evaluated by the cardiology service and found to have severe three-vessel coronary artery disease.  He has preserved LV function and no valvular dysfunction.  Coronary bypass grafting is his best option for revascularization.  The procedure was described to Mr. Stark in detail along with the expected perioperative course.  I explained to Mr. Stark that it is difficult to determine whether or not his dizziness has any  relationship to his coronary disease and that surgery may not alter the symptoms at all.  His questions were  answered.  He would like for [place] to proceed with plans for surgery which has been tentatively scheduled for tomorrow morning.  -Type 2 diabetes mellitus--poorly controlled, hemoglobin A1c was 9.9 on admission and his glucose was over 500 last night.  He is currently on Lantus 10 units/day and sliding scale insulin.  [name] admits he is not been able to buy his medications for several months but recently was able to get insurance and get back on his prescribed treatment that includes insulin, metformin, and glipizide.  -History of tobacco use-[name] is a current, every day smoker.  He implies that he is quite ready to smoking at this point..  -Dyslipidemia-currently on rosuvastatin  -Suspected peripheral vascular disease-preop vascular duplex scans indicate bilateral moderate lower extremity arterial disease    -Left carotid bruit-recent CTA of the head and neck obtained and April of this year shows a 50% right internal carotid artery stenosis and less than 50% left internal carotid artery stenosis.  Study noted bilateral carotid webs.     I  spent 25 minutes counseling the patient face to face.   Leary Roca, PA-C  12/26/2020 11:43 AM

## 2020-12-26 NOTE — Anesthesia Preprocedure Evaluation (Addendum)
Anesthesia Evaluation  Patient identified by MRN, date of birth, ID band Patient awake    Reviewed: Allergy & Precautions, NPO status , Patient's Chart, lab work & pertinent test results  History of Anesthesia Complications Negative for: history of anesthetic complications  Airway Mallampati: II  TM Distance: >3 FB Neck ROM: Full    Dental  (+) Missing, Poor Dentition, Dental Advisory Given   Pulmonary Current Smoker and Patient abstained from smoking.,    Pulmonary exam normal        Cardiovascular hypertension, + angina + CAD  Normal cardiovascular exam  IMPRESSIONS   1. Left ventricular ejection fraction, by estimation, is 55 to 60%. The left ventricle has normal function. The left ventricle has no regional wall motion abnormalities. Left ventricular diastolic parameters are consistent with GradeI diastolic  dysfunction (impaired relaxation). 2. Right ventricular systolic function is normal. The right ventricular sizeis normal. 3. The mitral valve is normal in structure. No evidence of mitral valveregurgitation. No evidence of mitral stenosis. 4. The aortic valve was not well visualized. Aortic valve regurgitation is notvisualized. No aortic stenosis is present. 5. The inferior vena cava is normal in size with greater than 50% respiratoryvariability, suggesting right atrial pressure of 3 mmHg.  1. Severe three vessel CAD 2. Severe proximal to mid LAD stenosis. Severe stenosis in the moderate  caliber first diagonal branch.  3. Severe stenosis proximal Circumflex and distal AV groove Circumflex.  Severe stenosis first obtuse marginal branch.  4. Chronic occlusion of the dominant RCA. The PDA fills from left to right  collaterals.   Recommendations: He has been having daily angina. Will admit to telemetry.  Echo to assess LV function. CT surgery consult for CABG.        Neuro/Psych negative neurological ROS      GI/Hepatic negative GI ROS, Neg liver ROS,   Endo/Other  diabetes  Renal/GU negative Renal ROS     Musculoskeletal negative musculoskeletal ROS (+)   Abdominal   Peds  Hematology negative hematology ROS (+)   Anesthesia Other Findings   Reproductive/Obstetrics                            Anesthesia Physical Anesthesia Plan  ASA: 4  Anesthesia Plan: General   Post-op Pain Management:    Induction: Intravenous  PONV Risk Score and Plan: 2 and Ondansetron and Dexamethasone  Airway Management Planned: Oral ETT  Additional Equipment: Arterial line, CVP, 3D TEE and Ultrasound Guidance Line Placement  Intra-op Plan:   Post-operative Plan: Post-operative intubation/ventilation  Informed Consent: I have reviewed the patients History and Physical, chart, labs and discussed the procedure including the risks, benefits and alternatives for the proposed anesthesia with the patient or authorized representative who has indicated his/her understanding and acceptance.     Dental advisory given  Plan Discussed with: Anesthesiologist, CRNA and Surgeon  Anesthesia Plan Comments:        Anesthesia Quick Evaluation

## 2020-12-26 NOTE — Progress Notes (Signed)
BP 74/46. Pt asymptomatic and resting in bed. MD paged.

## 2020-12-26 NOTE — Progress Notes (Signed)
Pt c/o light CP and severe HA, 8/10. Discussed IS (1000 ml), sternal precautions, mobility post op, and d/c planning. Pt sts his wife will be with him at d/c. Will not ambulate today due to low BP and CP. Gave materials to view. [phone] Ethelda Chick CES, ACSM 12:06 PM 12/26/2020

## 2020-12-26 NOTE — Progress Notes (Signed)
     [address]       [name] [address]             [phone]       55 year old male with three-vessel coronary disease.  He presents with worsening chest exertional dyspnea and dizziness.  Left heart cath showed severe three-vessel coronary disease.  I will plan for CABG with left radial artery harvest on 12/27/2020.  He is agreeable to proceed.  [name]

## 2020-12-27 ENCOUNTER — Inpatient Hospital Stay (HOSPITAL_COMMUNITY): Payer: Medicaid Other | Admitting: Certified Registered"

## 2020-12-27 ENCOUNTER — Inpatient Hospital Stay (HOSPITAL_COMMUNITY)
Admission: RE | Disposition: A | Discharge status: Home / Self Care | Payer: Self-pay | Source: Home / Self Care | Attending: Thoracic Surgery (Cardiothoracic Vascular Surgery)

## 2020-12-27 ENCOUNTER — Inpatient Hospital Stay (HOSPITAL_COMMUNITY): Payer: Medicaid Other

## 2020-12-27 ENCOUNTER — Encounter (HOSPITAL_COMMUNITY): Payer: Self-pay | Admitting: Cardiovascular Disease

## 2020-12-27 DIAGNOSIS — I25110 Atherosclerotic heart disease of native coronary artery with unstable angina pectoris: Secondary | ICD-10-CM

## 2020-12-27 DIAGNOSIS — Z951 Presence of aortocoronary bypass graft: Secondary | ICD-10-CM

## 2020-12-27 DIAGNOSIS — I214 Non-ST elevation (NSTEMI) myocardial infarction: Secondary | ICD-10-CM

## 2020-12-27 HISTORY — PX: TEE WITHOUT CARDIOVERSION: SHX5443

## 2020-12-27 HISTORY — PX: CORONARY ARTERY BYPASS GRAFT: SHX141

## 2020-12-27 LAB — POCT I-STAT 7, (LYTES, BLD GAS, ICA,H+H)
Acid-base deficit: 5 mmol/L — ABNORMAL HIGH (ref 0.0–2.0)
Acid-base deficit: 3 mmol/L — ABNORMAL HIGH (ref 0.0–2.0)
Acid-base deficit: 5 mmol/L — ABNORMAL HIGH (ref 0.0–2.0)
Acid-base deficit: 4 mmol/L — ABNORMAL HIGH (ref 0.0–2.0)
Acid-base deficit: 3 mmol/L — ABNORMAL HIGH (ref 0.0–2.0)
Acid-base deficit: 4 mmol/L — ABNORMAL HIGH (ref 0.0–2.0)
Acid-base deficit: 3 mmol/L — ABNORMAL HIGH (ref 0.0–2.0)
Acid-base deficit: 6 mmol/L — ABNORMAL HIGH (ref 0.0–2.0)
Acid-base deficit: 6 mmol/L — ABNORMAL HIGH (ref 0.0–2.0)
Acid-base deficit: 5 mmol/L — ABNORMAL HIGH (ref 0.0–2.0)
Bicarbonate: 20.3 mmol/L (ref 20.0–28.0)
Bicarbonate: 21.5 mmol/L (ref 20.0–28.0)
Bicarbonate: 22.8 mmol/L (ref 20.0–28.0)
Bicarbonate: 21.3 mmol/L (ref 20.0–28.0)
Bicarbonate: 23.1 mmol/L (ref 20.0–28.0)
Bicarbonate: 20.9 mmol/L (ref 20.0–28.0)
Bicarbonate: 22 mmol/L (ref 20.0–28.0)
Bicarbonate: 19.6 mmol/L — ABNORMAL LOW (ref 20.0–28.0)
Bicarbonate: 20.8 mmol/L (ref 20.0–28.0)
Bicarbonate: 20.7 mmol/L (ref 20.0–28.0)
Calcium, Ion: 1.25 mmol/L (ref 1.15–1.40)
Calcium, Ion: 1.02 mmol/L — ABNORMAL LOW (ref 1.15–1.40)
Calcium, Ion: 1.29 mmol/L (ref 1.15–1.40)
Calcium, Ion: 0.89 mmol/L — CL (ref 1.15–1.40)
Calcium, Ion: 1.01 mmol/L — ABNORMAL LOW (ref 1.15–1.40)
Calcium, Ion: 1.28 mmol/L (ref 1.15–1.40)
Calcium, Ion: 1.24 mmol/L (ref 1.15–1.40)
Calcium, Ion: 1.21 mmol/L (ref 1.15–1.40)
Calcium, Ion: 1.24 mmol/L (ref 1.15–1.40)
Calcium, Ion: 1.26 mmol/L (ref 1.15–1.40)
HCT: 36 % — ABNORMAL LOW (ref 39.0–52.0)
HCT: 26 % — ABNORMAL LOW (ref 39.0–52.0)
HCT: 35 % — ABNORMAL LOW (ref 39.0–52.0)
HCT: 23 % — ABNORMAL LOW (ref 39.0–52.0)
HCT: 25 % — ABNORMAL LOW (ref 39.0–52.0)
HCT: 21 % — ABNORMAL LOW (ref 39.0–52.0)
HCT: 22 % — ABNORMAL LOW (ref 39.0–52.0)
HCT: 22 % — ABNORMAL LOW (ref 39.0–52.0)
HCT: 20 % — ABNORMAL LOW (ref 39.0–52.0)
HCT: 21 % — ABNORMAL LOW (ref 39.0–52.0)
Hemoglobin: 12.2 g/dL — ABNORMAL LOW (ref 13.0–17.0)
Hemoglobin: 11.9 g/dL — ABNORMAL LOW (ref 13.0–17.0)
Hemoglobin: 8.8 g/dL — ABNORMAL LOW (ref 13.0–17.0)
Hemoglobin: 7.8 g/dL — ABNORMAL LOW (ref 13.0–17.0)
Hemoglobin: 8.5 g/dL — ABNORMAL LOW (ref 13.0–17.0)
Hemoglobin: 7.1 g/dL — ABNORMAL LOW (ref 13.0–17.0)
Hemoglobin: 7.5 g/dL — ABNORMAL LOW (ref 13.0–17.0)
Hemoglobin: 7.5 g/dL — ABNORMAL LOW (ref 13.0–17.0)
Hemoglobin: 6.8 g/dL — CL (ref 13.0–17.0)
Hemoglobin: 7.1 g/dL — ABNORMAL LOW (ref 13.0–17.0)
O2 Saturation: 100 %
O2 Saturation: 100 %
O2 Saturation: 100 %
O2 Saturation: 100 %
O2 Saturation: 100 %
O2 Saturation: 100 %
O2 Saturation: 100 %
O2 Saturation: 99 %
O2 Saturation: 99 %
O2 Saturation: 100 %
Patient temperature: 37
Patient temperature: 37
Patient temperature: 37
Patient temperature: 36.9
Patient temperature: 37.1
Patient temperature: 37.1
Potassium: 4.4 mmol/L (ref 3.5–5.1)
Potassium: 4.2 mmol/L (ref 3.5–5.1)
Potassium: 4.3 mmol/L (ref 3.5–5.1)
Potassium: 5.9 mmol/L — ABNORMAL HIGH (ref 3.5–5.1)
Potassium: 5.1 mmol/L (ref 3.5–5.1)
Potassium: 4.1 mmol/L (ref 3.5–5.1)
Potassium: 3.5 mmol/L (ref 3.5–5.1)
Potassium: 3.6 mmol/L (ref 3.5–5.1)
Potassium: 3.9 mmol/L (ref 3.5–5.1)
Potassium: 4.3 mmol/L (ref 3.5–5.1)
Sodium: 142 mmol/L (ref 135–145)
Sodium: 144 mmol/L (ref 135–145)
Sodium: 145 mmol/L (ref 135–145)
Sodium: 142 mmol/L (ref 135–145)
Sodium: 145 mmol/L (ref 135–145)
Sodium: 147 mmol/L — ABNORMAL HIGH (ref 135–145)
Sodium: 147 mmol/L — ABNORMAL HIGH (ref 135–145)
Sodium: 144 mmol/L (ref 135–145)
Sodium: 144 mmol/L (ref 135–145)
Sodium: 142 mmol/L (ref 135–145)
TCO2: 21 mmol/L — ABNORMAL LOW (ref 22–32)
TCO2: 23 mmol/L (ref 22–32)
TCO2: 24 mmol/L (ref 22–32)
TCO2: 22 mmol/L (ref 22–32)
TCO2: 24 mmol/L (ref 22–32)
TCO2: 22 mmol/L (ref 22–32)
TCO2: 23 mmol/L (ref 22–32)
TCO2: 21 mmol/L — ABNORMAL LOW (ref 22–32)
TCO2: 22 mmol/L (ref 22–32)
TCO2: 22 mmol/L (ref 22–32)
pCO2 arterial: 39.5 mmHg (ref 32.0–48.0)
pCO2 arterial: 42.4 mmHg (ref 32.0–48.0)
pCO2 arterial: 43.7 mmHg (ref 32.0–48.0)
pCO2 arterial: 39.8 mmHg (ref 32.0–48.0)
pCO2 arterial: 42.7 mmHg (ref 32.0–48.0)
pCO2 arterial: 37.1 mmHg (ref 32.0–48.0)
pCO2 arterial: 38.9 mmHg (ref 32.0–48.0)
pCO2 arterial: 36.4 mmHg (ref 32.0–48.0)
pCO2 arterial: 44.3 mmHg (ref 32.0–48.0)
pCO2 arterial: 42.8 mmHg (ref 32.0–48.0)
pH, Arterial: 7.318 — ABNORMAL LOW (ref 7.350–7.450)
pH, Arterial: 7.3 — ABNORMAL LOW (ref 7.350–7.450)
pH, Arterial: 7.338 — ABNORMAL LOW (ref 7.350–7.450)
pH, Arterial: 7.337 — ABNORMAL LOW (ref 7.350–7.450)
pH, Arterial: 7.341 — ABNORMAL LOW (ref 7.350–7.450)
pH, Arterial: 7.358 (ref 7.350–7.450)
pH, Arterial: 7.36 (ref 7.350–7.450)
pH, Arterial: 7.34 — ABNORMAL LOW (ref 7.350–7.450)
pH, Arterial: 7.279 — ABNORMAL LOW (ref 7.350–7.450)
pH, Arterial: 7.293 — ABNORMAL LOW (ref 7.350–7.450)
pO2, Arterial: 557 mmHg — ABNORMAL HIGH (ref 83.0–108.0)
pO2, Arterial: 299 mmHg — ABNORMAL HIGH (ref 83.0–108.0)
pO2, Arterial: 358 mmHg — ABNORMAL HIGH (ref 83.0–108.0)
pO2, Arterial: 335 mmHg — ABNORMAL HIGH (ref 83.0–108.0)
pO2, Arterial: 356 mmHg — ABNORMAL HIGH (ref 83.0–108.0)
pO2, Arterial: 266 mmHg — ABNORMAL HIGH (ref 83.0–108.0)
pO2, Arterial: 210 mmHg — ABNORMAL HIGH (ref 83.0–108.0)
pO2, Arterial: 167 mmHg — ABNORMAL HIGH (ref 83.0–108.0)
pO2, Arterial: 159 mmHg — ABNORMAL HIGH (ref 83.0–108.0)
pO2, Arterial: 204 mmHg — ABNORMAL HIGH (ref 83.0–108.0)

## 2020-12-27 LAB — COMPREHENSIVE METABOLIC PANEL
ALT: 47 U/L — ABNORMAL HIGH (ref 0–44)
AST: 40 U/L (ref 15–41)
Albumin: 3.6 g/dL (ref 3.5–5.0)
Alkaline Phosphatase: 101 U/L (ref 38–126)
Anion gap: 5 (ref 5–15)
BUN: 20 mg/dL (ref 6–20)
CO2: 23 mmol/L (ref 22–32)
Calcium: 9.2 mg/dL (ref 8.9–10.3)
Chloride: 112 mmol/L — ABNORMAL HIGH (ref 98–111)
Creatinine, Ser: 0.89 mg/dL (ref 0.61–1.24)
GFR, Estimated: >60 mL/min (ref >60)
Glucose, Bld: 171 mg/dL — ABNORMAL HIGH (ref 70–99)
Potassium: 4.5 mmol/L (ref 3.5–5.1)
Sodium: 140 mmol/L (ref 135–145)
Total Bilirubin: 0.6 mg/dL (ref 0.3–1.2)
Total Protein: 6.6 g/dL (ref 6.5–8.1)

## 2020-12-27 LAB — ECHO INTRAOPERATIVE TEE
AV Mean grad: 5 mmHg
AV Peak grad: 8.2 mmHg
Ao pk vel: 1.43 m/s
Height: 67 in
Weight: 2075.2 oz

## 2020-12-27 LAB — GLUCOSE, CAPILLARY
Glucose-Capillary: 161 mg/dL — ABNORMAL HIGH (ref 70–99)
Glucose-Capillary: 82 mg/dL (ref 70–99)
Glucose-Capillary: 232 mg/dL — ABNORMAL HIGH (ref 70–99)
Glucose-Capillary: 25 mg/dL — CL (ref 70–99)
Glucose-Capillary: 173 mg/dL — ABNORMAL HIGH (ref 70–99)
Glucose-Capillary: 147 mg/dL — ABNORMAL HIGH (ref 70–99)
Glucose-Capillary: 121 mg/dL — ABNORMAL HIGH (ref 70–99)
Glucose-Capillary: 65 mg/dL — ABNORMAL LOW (ref 70–99)
Glucose-Capillary: 154 mg/dL — ABNORMAL HIGH (ref 70–99)
Glucose-Capillary: 143 mg/dL — ABNORMAL HIGH (ref 70–99)
Glucose-Capillary: 135 mg/dL — ABNORMAL HIGH (ref 70–99)
Glucose-Capillary: 166 mg/dL — ABNORMAL HIGH (ref 70–99)

## 2020-12-27 LAB — BASIC METABOLIC PANEL
Anion gap: 4 — ABNORMAL LOW (ref 5–15)
BUN: 13 mg/dL (ref 6–20)
CO2: 21 mmol/L — ABNORMAL LOW (ref 22–32)
Calcium: 8.1 mg/dL — ABNORMAL LOW (ref 8.9–10.3)
Chloride: 112 mmol/L — ABNORMAL HIGH (ref 98–111)
Creatinine, Ser: 0.65 mg/dL (ref 0.61–1.24)
GFR, Estimated: >60 mL/min (ref >60)
Glucose, Bld: 154 mg/dL — ABNORMAL HIGH (ref 70–99)
Potassium: 3.6 mmol/L (ref 3.5–5.1)
Sodium: 137 mmol/L (ref 135–145)

## 2020-12-27 LAB — POCT I-STAT EG7
Acid-base deficit: 4 mmol/L — ABNORMAL HIGH (ref 0.0–2.0)
Bicarbonate: 22.4 mmol/L (ref 20.0–28.0)
Calcium, Ion: 1.06 mmol/L — ABNORMAL LOW (ref 1.15–1.40)
HCT: 26 % — ABNORMAL LOW (ref 39.0–52.0)
Hemoglobin: 8.8 g/dL — ABNORMAL LOW (ref 13.0–17.0)
O2 Saturation: 77 %
Patient temperature: 37
Potassium: 4.2 mmol/L (ref 3.5–5.1)
Sodium: 146 mmol/L — ABNORMAL HIGH (ref 135–145)
TCO2: 24 mmol/L (ref 22–32)
pCO2, Ven: 44.2 mmHg (ref 44.0–60.0)
pH, Ven: 7.312 (ref 7.250–7.430)
pO2, Ven: 45 mmHg (ref 32.0–45.0)

## 2020-12-27 LAB — CBC
HCT: 40.3 % (ref 39.0–52.0)
HCT: 24.1 % — ABNORMAL LOW (ref 39.0–52.0)
HCT: 22.3 % — ABNORMAL LOW (ref 39.0–52.0)
Hemoglobin: 14 g/dL (ref 13.0–17.0)
Hemoglobin: 8.5 g/dL — ABNORMAL LOW (ref 13.0–17.0)
Hemoglobin: 7.8 g/dL — ABNORMAL LOW (ref 13.0–17.0)
MCH: 29.6 pg (ref 26.0–34.0)
MCH: 30.2 pg (ref 26.0–34.0)
MCH: 30.2 pg (ref 26.0–34.0)
MCHC: 34.7 g/dL (ref 30.0–36.0)
MCHC: 35.3 g/dL (ref 30.0–36.0)
MCHC: 35 g/dL (ref 30.0–36.0)
MCV: 85.2 fL (ref 80.0–100.0)
MCV: 85.8 fL (ref 80.0–100.0)
MCV: 86.4 fL (ref 80.0–100.0)
Platelets: 223 10*3/uL (ref 150–400)
Platelets: 117 10*3/uL — ABNORMAL LOW (ref 150–400)
Platelets: 126 10*3/uL — ABNORMAL LOW (ref 150–400)
RBC: 4.73 MIL/uL (ref 4.22–5.81)
RBC: 2.81 MIL/uL — ABNORMAL LOW (ref 4.22–5.81)
RBC: 2.58 MIL/uL — ABNORMAL LOW (ref 4.22–5.81)
RDW: 12.8 % (ref 11.5–15.5)
RDW: 13.1 % (ref 11.5–15.5)
RDW: 13.1 % (ref 11.5–15.5)
WBC: 9.2 10*3/uL (ref 4.0–10.5)
WBC: 5.3 10*3/uL (ref 4.0–10.5)
WBC: 8.6 10*3/uL (ref 4.0–10.5)
nRBC: 0 % (ref 0.0–0.2)
nRBC: 0 % (ref 0.0–0.2)
nRBC: 0 % (ref 0.0–0.2)

## 2020-12-27 LAB — POCT I-STAT, CHEM 8
BUN: 20 mg/dL (ref 6–20)
BUN: 20 mg/dL (ref 6–20)
BUN: 20 mg/dL (ref 6–20)
BUN: 17 mg/dL (ref 6–20)
BUN: 16 mg/dL (ref 6–20)
BUN: 13 mg/dL (ref 6–20)
BUN: 16 mg/dL (ref 6–20)
Calcium, Ion: 1.32 mmol/L (ref 1.15–1.40)
Calcium, Ion: 1.23 mmol/L (ref 1.15–1.40)
Calcium, Ion: 1.32 mmol/L (ref 1.15–1.40)
Calcium, Ion: 0.95 mmol/L — ABNORMAL LOW (ref 1.15–1.40)
Calcium, Ion: 0.99 mmol/L — ABNORMAL LOW (ref 1.15–1.40)
Calcium, Ion: 1.29 mmol/L (ref 1.15–1.40)
Calcium, Ion: >2.5 mmol/L (ref 1.15–1.40)
Chloride: 112 mmol/L — ABNORMAL HIGH (ref 98–111)
Chloride: 113 mmol/L — ABNORMAL HIGH (ref 98–111)
Chloride: 112 mmol/L — ABNORMAL HIGH (ref 98–111)
Chloride: 109 mmol/L (ref 98–111)
Chloride: 112 mmol/L — ABNORMAL HIGH (ref 98–111)
Chloride: 113 mmol/L — ABNORMAL HIGH (ref 98–111)
Chloride: 119 mmol/L — ABNORMAL HIGH (ref 98–111)
Creatinine, Ser: 0.6 mg/dL — ABNORMAL LOW (ref 0.61–1.24)
Creatinine, Ser: 0.5 mg/dL — ABNORMAL LOW (ref 0.61–1.24)
Creatinine, Ser: 0.6 mg/dL — ABNORMAL LOW (ref 0.61–1.24)
Creatinine, Ser: 0.4 mg/dL — ABNORMAL LOW (ref 0.61–1.24)
Creatinine, Ser: 0.5 mg/dL — ABNORMAL LOW (ref 0.61–1.24)
Creatinine, Ser: 0.5 mg/dL — ABNORMAL LOW (ref 0.61–1.24)
Creatinine, Ser: 0.5 mg/dL — ABNORMAL LOW (ref 0.61–1.24)
Glucose, Bld: 118 mg/dL — ABNORMAL HIGH (ref 70–99)
Glucose, Bld: 94 mg/dL (ref 70–99)
Glucose, Bld: 95 mg/dL (ref 70–99)
Glucose, Bld: 121 mg/dL — ABNORMAL HIGH (ref 70–99)
Glucose, Bld: 155 mg/dL — ABNORMAL HIGH (ref 70–99)
Glucose, Bld: 128 mg/dL — ABNORMAL HIGH (ref 70–99)
Glucose, Bld: 154 mg/dL — ABNORMAL HIGH (ref 70–99)
HCT: 34 % — ABNORMAL LOW (ref 39.0–52.0)
HCT: 34 % — ABNORMAL LOW (ref 39.0–52.0)
HCT: 34 % — ABNORMAL LOW (ref 39.0–52.0)
HCT: 28 % — ABNORMAL LOW (ref 39.0–52.0)
HCT: 24 % — ABNORMAL LOW (ref 39.0–52.0)
HCT: 24 % — ABNORMAL LOW (ref 39.0–52.0)
HCT: 26 % — ABNORMAL LOW (ref 39.0–52.0)
Hemoglobin: 11.6 g/dL — ABNORMAL LOW (ref 13.0–17.0)
Hemoglobin: 11.6 g/dL — ABNORMAL LOW (ref 13.0–17.0)
Hemoglobin: 11.6 g/dL — ABNORMAL LOW (ref 13.0–17.0)
Hemoglobin: 9.5 g/dL — ABNORMAL LOW (ref 13.0–17.0)
Hemoglobin: 8.2 g/dL — ABNORMAL LOW (ref 13.0–17.0)
Hemoglobin: 8.2 g/dL — ABNORMAL LOW (ref 13.0–17.0)
Hemoglobin: 8.8 g/dL — ABNORMAL LOW (ref 13.0–17.0)
Potassium: 4.4 mmol/L (ref 3.5–5.1)
Potassium: 4.2 mmol/L (ref 3.5–5.1)
Potassium: 4.2 mmol/L (ref 3.5–5.1)
Potassium: 6.5 mmol/L (ref 3.5–5.1)
Potassium: 5.6 mmol/L — ABNORMAL HIGH (ref 3.5–5.1)
Potassium: 4.1 mmol/L (ref 3.5–5.1)
Potassium: 5 mmol/L (ref 3.5–5.1)
Sodium: 143 mmol/L (ref 135–145)
Sodium: 143 mmol/L (ref 135–145)
Sodium: 144 mmol/L (ref 135–145)
Sodium: 140 mmol/L (ref 135–145)
Sodium: 145 mmol/L (ref 135–145)
Sodium: 144 mmol/L (ref 135–145)
Sodium: 146 mmol/L — ABNORMAL HIGH (ref 135–145)
TCO2: 22 mmol/L (ref 22–32)
TCO2: 21 mmol/L — ABNORMAL LOW (ref 22–32)
TCO2: 22 mmol/L (ref 22–32)
TCO2: 20 mmol/L — ABNORMAL LOW (ref 22–32)
TCO2: 22 mmol/L (ref 22–32)
TCO2: 21 mmol/L — ABNORMAL LOW (ref 22–32)
TCO2: 22 mmol/L (ref 22–32)

## 2020-12-27 LAB — BLOOD GAS, ARTERIAL
Acid-base deficit: 5.3 mmol/L — ABNORMAL HIGH (ref 0.0–2.0)
Bicarbonate: 19.5 mmol/L — ABNORMAL LOW (ref 20.0–28.0)
Drawn by: 56037
FIO2: 21
O2 Saturation: 98.7 %
Patient temperature: 36.5
pCO2 arterial: 36.8 mmHg (ref 32.0–48.0)
pH, Arterial: 7.34 — ABNORMAL LOW (ref 7.350–7.450)
pO2, Arterial: 125 mmHg — ABNORMAL HIGH (ref 83.0–108.0)

## 2020-12-27 LAB — PREPARE RBC (CROSSMATCH)

## 2020-12-27 LAB — APTT
aPTT: 28 seconds (ref 24–36)
aPTT: 37 seconds — ABNORMAL HIGH (ref 24–36)

## 2020-12-27 LAB — PROTIME-INR
INR: 1 (ref 0.8–1.2)
INR: 1.6 — ABNORMAL HIGH (ref 0.8–1.2)
Prothrombin Time: 13.1 seconds (ref 11.4–15.2)
Prothrombin Time: 19.5 seconds — ABNORMAL HIGH (ref 11.4–15.2)

## 2020-12-27 LAB — PLATELET COUNT: Platelets: 165 10*3/uL (ref 150–400)

## 2020-12-27 LAB — HEMOGLOBIN AND HEMATOCRIT, BLOOD
HCT: 25.1 % — ABNORMAL LOW (ref 39.0–52.0)
Hemoglobin: 8.6 g/dL — ABNORMAL LOW (ref 13.0–17.0)

## 2020-12-27 LAB — MAGNESIUM: Magnesium: 2.7 mg/dL — ABNORMAL HIGH (ref 1.7–2.4)

## 2020-12-27 IMAGING — DX DG CHEST 1V PORT
1 series · 1 of 1 positions shown · non-contrast
Comparison: [DATE]

CLINICAL DATA: Postop CABG

EXAM:
PORTABLE CHEST 1 VIEW

[chest]
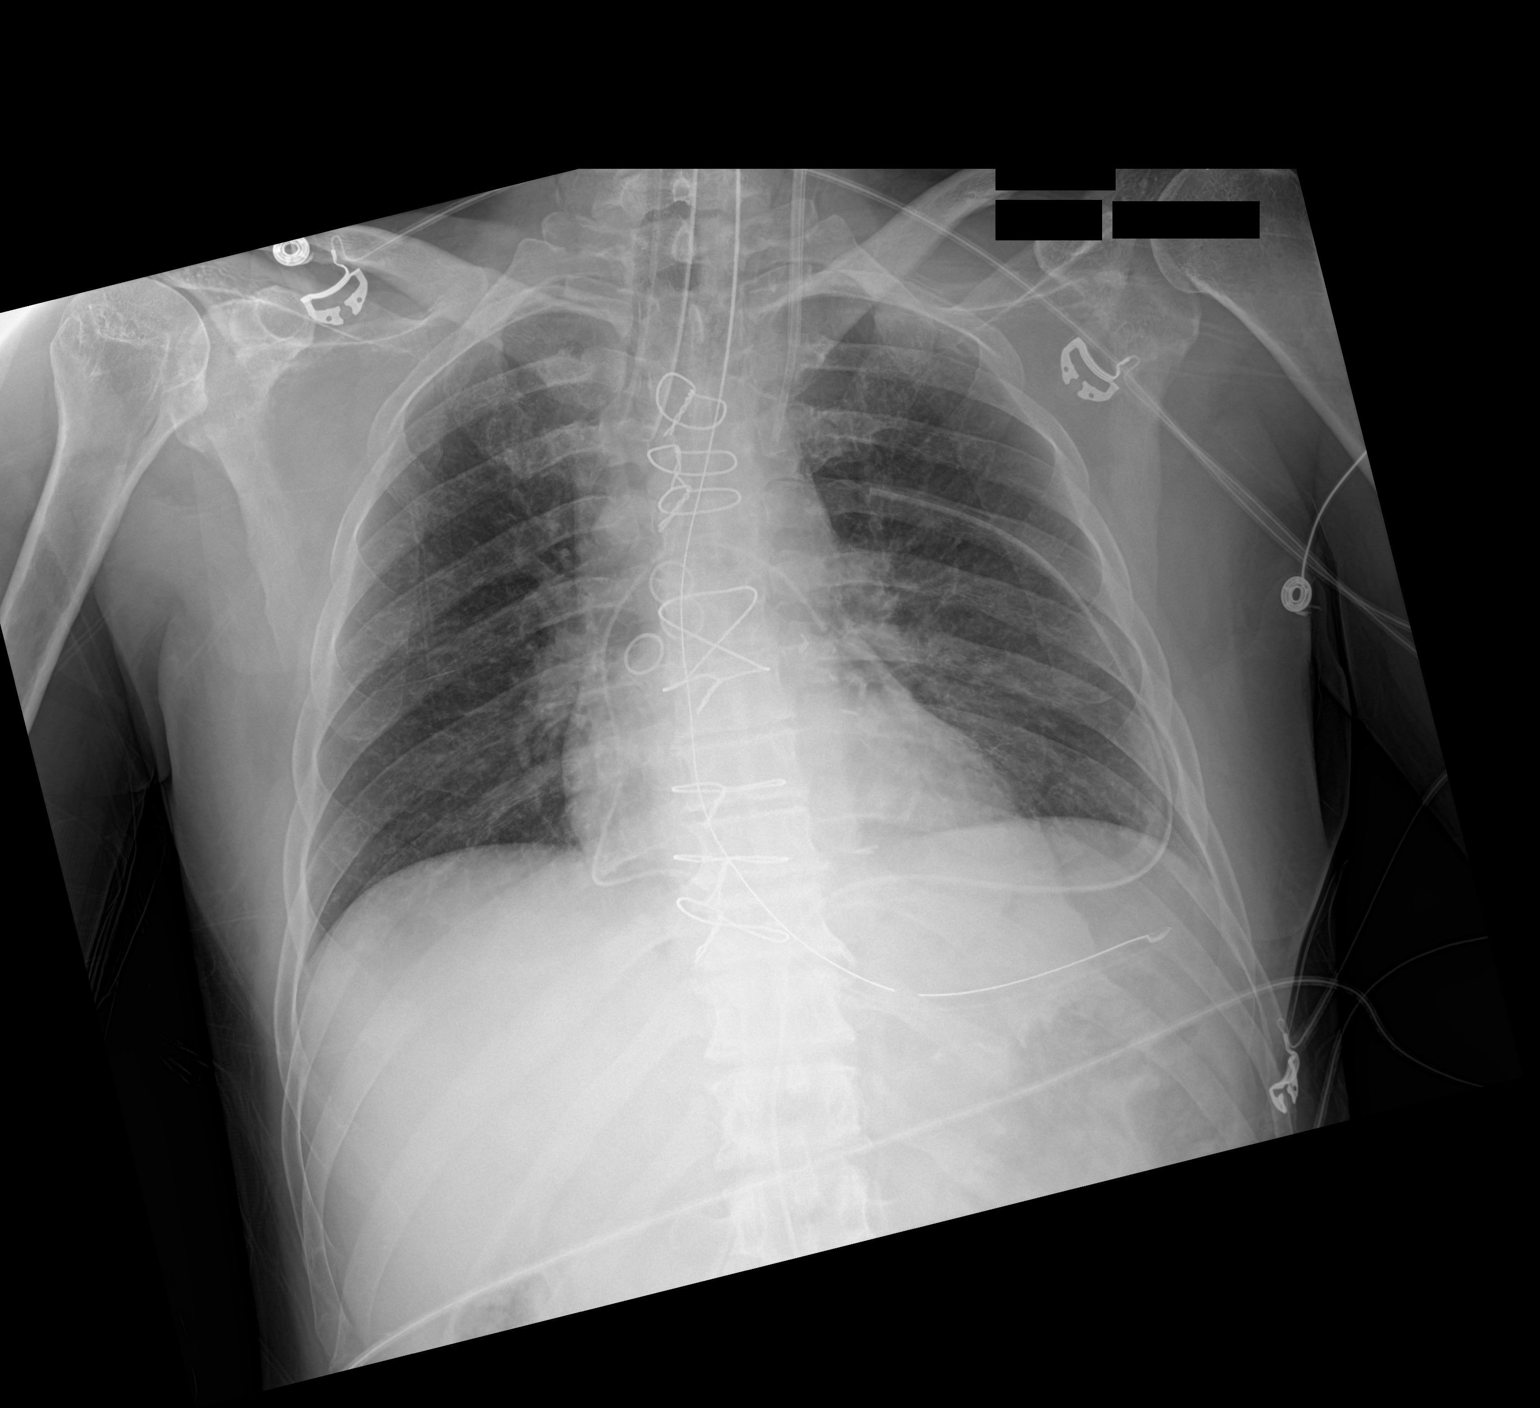

[1 of 1 positions shown; findings below may reference images not displayed]

FINDINGS: Postop CABG. Endotracheal tube in good position. Left jugular
central venous catheter tip in the left innominate vein. NG tube in
the stomach. Catheter overlying the mediastinum.

Left chest tube in place. No pneumothorax. Lungs are well aerated
without infiltrate or edema. No significant pleural effusion.
IMPRESSION: No acute findings.  Postop CABG.

## 2020-12-27 SURGERY — CORONARY ARTERY BYPASS GRAFTING (CABG)
Anesthesia: General | Site: Chest

## 2020-12-27 MED ORDER — VASOPRESSIN 20 UNITS/100 ML INFUSION FOR SHOCK
0.0000 [IU]/min | INTRAVENOUS | Status: DC
  Administered 2020-12-27: 0.04 [IU]/min via INTRAVENOUS
  Filled 2020-12-27: qty 100

## 2020-12-27 MED ORDER — PANTOPRAZOLE SODIUM 40 MG PO TBEC
40.0000 mg | DELAYED_RELEASE_TABLET | Freq: Every day | ORAL | Status: DC
  Administered 2020-12-29 – 2021-01-05 (×8): 40 mg via ORAL
  Filled 2020-12-27 (×8): qty 1

## 2020-12-27 MED ORDER — LACTATED RINGERS IV SOLN: INTRAVENOUS | Status: DC | PRN

## 2020-12-27 MED ORDER — SODIUM CHLORIDE 0.9 % IV SOLN: INTRAVENOUS | Status: DC

## 2020-12-27 MED ORDER — OXYCODONE HCL 5 MG PO TABS
5.0000 mg | ORAL_TABLET | ORAL | Status: DC | PRN
  Administered 2020-12-28 (×2): 10 mg via ORAL
  Filled 2020-12-27 (×2): qty 2

## 2020-12-27 MED ORDER — PROPOFOL 10 MG/ML IV BOLUS
INTRAVENOUS | Status: AC
  Filled 2020-12-27: qty 20

## 2020-12-27 MED ORDER — ALBUMIN HUMAN 5 % IV SOLN: INTRAVENOUS | Status: DC | PRN

## 2020-12-27 MED ORDER — ACETAMINOPHEN 160 MG/5ML PO SOLN: 650.0000 mg | Freq: Once | ORAL | Status: AC

## 2020-12-27 MED ORDER — ONDANSETRON HCL 4 MG/2ML IJ SOLN
4.0000 mg | Freq: Four times a day (QID) | INTRAMUSCULAR | Status: DC | PRN
  Administered 2021-01-03: 4 mg via INTRAVENOUS
  Filled 2020-12-27: qty 2

## 2020-12-27 MED ORDER — PHENYLEPHRINE 40 MCG/ML (10ML) SYRINGE FOR IV PUSH (FOR BLOOD PRESSURE SUPPORT)
PREFILLED_SYRINGE | INTRAVENOUS | Status: DC | PRN
  Administered 2020-12-27 (×2): 80 ug via INTRAVENOUS
  Administered 2020-12-27: 120 ug via INTRAVENOUS
  Administered 2020-12-27: 80 ug via INTRAVENOUS
  Administered 2020-12-27: 120 ug via INTRAVENOUS
  Administered 2020-12-27: 60 ug via INTRAVENOUS
  Administered 2020-12-27: 80 ug via INTRAVENOUS
  Administered 2020-12-27: 60 ug via INTRAVENOUS
  Administered 2020-12-27 (×2): 80 ug via INTRAVENOUS
  Administered 2020-12-27: 120 ug via INTRAVENOUS
  Administered 2020-12-27 (×2): 80 ug via INTRAVENOUS

## 2020-12-27 MED ORDER — ACETAMINOPHEN 500 MG PO TABS
1000.0000 mg | ORAL_TABLET | Freq: Four times a day (QID) | ORAL | Status: AC
  Administered 2020-12-27 – 2021-01-01 (×18): 1000 mg via ORAL
  Filled 2020-12-27 (×19): qty 2

## 2020-12-27 MED ORDER — METOPROLOL TARTRATE 25 MG/10 ML ORAL SUSPENSION
12.5000 mg | Freq: Two times a day (BID) | ORAL | Status: DC
  Filled 2020-12-27 (×13): qty 5

## 2020-12-27 MED ORDER — PHENYLEPHRINE 40 MCG/ML (10ML) SYRINGE FOR IV PUSH (FOR BLOOD PRESSURE SUPPORT)
PREFILLED_SYRINGE | INTRAVENOUS | Status: AC
  Filled 2020-12-27: qty 10

## 2020-12-27 MED ORDER — LACTATED RINGERS IV SOLN: 500.0000 mL | Freq: Once | INTRAVENOUS | Status: DC | PRN

## 2020-12-27 MED ORDER — CHLORHEXIDINE GLUCONATE 0.12% ORAL RINSE (MEDLINE KIT)
15.0000 mL | Freq: Two times a day (BID) | OROMUCOSAL | Status: DC
  Administered 2020-12-27: 15 mL via OROMUCOSAL

## 2020-12-27 MED ORDER — PHENYLEPHRINE 40 MCG/ML (10ML) SYRINGE FOR IV PUSH (FOR BLOOD PRESSURE SUPPORT)
PREFILLED_SYRINGE | INTRAVENOUS | Status: AC
  Filled 2020-12-27: qty 20

## 2020-12-27 MED ORDER — MIDAZOLAM HCL (PF) 5 MG/ML IJ SOLN
INTRAMUSCULAR | Status: DC | PRN
  Administered 2020-12-27: 2 mg via INTRAVENOUS
  Administered 2020-12-27: 1 mg via INTRAVENOUS
  Administered 2020-12-27: 2 mg via INTRAVENOUS

## 2020-12-27 MED ORDER — CEFAZOLIN SODIUM-DEXTROSE 2-4 GM/100ML-% IV SOLN
2.0000 g | Freq: Three times a day (TID) | INTRAVENOUS | Status: AC
  Administered 2020-12-27 – 2020-12-29 (×6): 2 g via INTRAVENOUS
  Filled 2020-12-27 (×7): qty 100

## 2020-12-27 MED ORDER — LACTATED RINGERS IV SOLN: INTRAVENOUS | Status: DC

## 2020-12-27 MED ORDER — ORAL CARE MOUTH RINSE
15.0000 mL | Freq: Two times a day (BID) | OROMUCOSAL | Status: DC
  Administered 2020-12-27 – 2021-01-05 (×16): 15 mL via OROMUCOSAL

## 2020-12-27 MED ORDER — BISACODYL 5 MG PO TBEC
10.0000 mg | DELAYED_RELEASE_TABLET | Freq: Every day | ORAL | Status: DC
  Administered 2020-12-28 – 2021-01-03 (×5): 10 mg via ORAL
  Filled 2020-12-27 (×6): qty 2

## 2020-12-27 MED ORDER — NOREPINEPHRINE 4 MG/250ML-% IV SOLN
0.0000 ug/min | INTRAVENOUS | Status: DC
  Filled 2020-12-27: qty 250

## 2020-12-27 MED ORDER — DEXMEDETOMIDINE HCL IN NACL 400 MCG/100ML IV SOLN: 0.0000 ug/kg/h | INTRAVENOUS | Status: DC

## 2020-12-27 MED ORDER — PHENYLEPHRINE HCL-NACL 10-0.9 MG/250ML-% IV SOLN
INTRAVENOUS | Status: DC | PRN
  Administered 2020-12-27: 25 ug/min via INTRAVENOUS

## 2020-12-27 MED ORDER — PLASMA-LYTE A IV SOLN
INTRAVENOUS | Status: DC | PRN
  Administered 2020-12-27: 1000 mL

## 2020-12-27 MED ORDER — SODIUM CHLORIDE 0.9 % IV SOLN: INTRAVENOUS | Status: DC | PRN

## 2020-12-27 MED ORDER — DOBUTAMINE IN D5W 4-5 MG/ML-% IV SOLN: 0.0000 ug/kg/min | INTRAVENOUS | Status: DC

## 2020-12-27 MED ORDER — SODIUM CHLORIDE 0.9% IV SOLUTION: Freq: Once | INTRAVENOUS | Status: DC

## 2020-12-27 MED ORDER — LIDOCAINE 2% (20 MG/ML) 5 ML SYRINGE: INTRAMUSCULAR | Status: DC | PRN

## 2020-12-27 MED ORDER — ROCURONIUM BROMIDE 10 MG/ML (PF) SYRINGE
PREFILLED_SYRINGE | INTRAVENOUS | Status: DC | PRN
  Administered 2020-12-27: 100 mg via INTRAVENOUS
  Administered 2020-12-27: 50 mg via INTRAVENOUS

## 2020-12-27 MED ORDER — ROCURONIUM BROMIDE 10 MG/ML (PF) SYRINGE
PREFILLED_SYRINGE | INTRAVENOUS | Status: AC
  Filled 2020-12-27: qty 10

## 2020-12-27 MED ORDER — FENTANYL CITRATE (PF) 250 MCG/5ML IJ SOLN
INTRAMUSCULAR | Status: DC | PRN
  Administered 2020-12-27: 100 ug via INTRAVENOUS
  Administered 2020-12-27: 50 ug via INTRAVENOUS
  Administered 2020-12-27: 150 ug via INTRAVENOUS
  Administered 2020-12-27: 50 ug via INTRAVENOUS
  Administered 2020-12-27: 100 ug via INTRAVENOUS
  Administered 2020-12-27 (×2): 150 ug via INTRAVENOUS
  Administered 2020-12-27 (×2): 100 ug via INTRAVENOUS
  Administered 2020-12-27 (×2): 150 ug via INTRAVENOUS

## 2020-12-27 MED ORDER — NITROGLYCERIN IN D5W 200-5 MCG/ML-% IV SOLN: 7.0000 ug/min | INTRAVENOUS | Status: DC

## 2020-12-27 MED ORDER — MAGNESIUM SULFATE 4 GM/100ML IV SOLN
4.0000 g | Freq: Once | INTRAVENOUS | Status: AC
  Administered 2020-12-27: 4 g via INTRAVENOUS
  Filled 2020-12-27: qty 100

## 2020-12-27 MED ORDER — ALBUMIN HUMAN 5 % IV SOLN
250.0000 mL | INTRAVENOUS | Status: AC | PRN
  Administered 2020-12-27 (×3): 12.5 g via INTRAVENOUS
  Filled 2020-12-27 (×2): qty 250

## 2020-12-27 MED ORDER — 0.9 % SODIUM CHLORIDE (POUR BTL) OPTIME
TOPICAL | Status: DC | PRN
  Administered 2020-12-27: 5000 mL

## 2020-12-27 MED ORDER — ONDANSETRON HCL 4 MG/2ML IJ SOLN
INTRAMUSCULAR | Status: DC | PRN
  Administered 2020-12-27: 4 mg via INTRAVENOUS

## 2020-12-27 MED ORDER — ONDANSETRON HCL 4 MG/2ML IJ SOLN
INTRAMUSCULAR | Status: AC
  Filled 2020-12-27: qty 2

## 2020-12-27 MED ORDER — PROPOFOL 10 MG/ML IV BOLUS
INTRAVENOUS | Status: DC | PRN
  Administered 2020-12-27: 50 mg via INTRAVENOUS

## 2020-12-27 MED ORDER — MIDAZOLAM HCL 2 MG/2ML IJ SOLN: 2.0000 mg | INTRAMUSCULAR | Status: DC | PRN

## 2020-12-27 MED ORDER — ACETAMINOPHEN 650 MG RE SUPP
650.0000 mg | Freq: Once | RECTAL | Status: AC
  Administered 2020-12-27: 650 mg via RECTAL

## 2020-12-27 MED ORDER — ARTIFICIAL TEARS OPHTHALMIC OINT
TOPICAL_OINTMENT | OPHTHALMIC | Status: DC | PRN
  Administered 2020-12-27: 1 via OPHTHALMIC

## 2020-12-27 MED ORDER — VASOPRESSIN 20 UNITS/100 ML INFUSION FOR SHOCK
0.0000 [IU]/min | INTRAVENOUS | Status: AC
Start: 2020-12-27 — End: 2020-12-27
  Administered 2020-12-27: .03 [IU]/min via INTRAVENOUS
  Filled 2020-12-27: qty 100

## 2020-12-27 MED ORDER — BISACODYL 10 MG RE SUPP: 10.0000 mg | Freq: Every day | RECTAL | Status: DC

## 2020-12-27 MED ORDER — TRAMADOL HCL 50 MG PO TABS
50.0000 mg | ORAL_TABLET | ORAL | Status: DC | PRN
  Administered 2020-12-28 – 2020-12-30 (×5): 100 mg via ORAL
  Filled 2020-12-27 (×6): qty 2

## 2020-12-27 MED ORDER — CHLORHEXIDINE GLUCONATE 0.12 % MT SOLN
15.0000 mL | OROMUCOSAL | Status: AC
  Administered 2020-12-27: 15 mL via OROMUCOSAL

## 2020-12-27 MED ORDER — SODIUM CHLORIDE 0.9% FLUSH
3.0000 mL | Freq: Two times a day (BID) | INTRAVENOUS | Status: DC
  Administered 2020-12-28 – 2020-12-31 (×5): 3 mL via INTRAVENOUS

## 2020-12-27 MED ORDER — METOPROLOL TARTRATE 5 MG/5ML IV SOLN: 2.5000 mg | INTRAVENOUS | Status: DC | PRN

## 2020-12-27 MED ORDER — ARTIFICIAL TEARS OPHTHALMIC OINT
TOPICAL_OINTMENT | OPHTHALMIC | Status: AC
  Filled 2020-12-27: qty 3.5

## 2020-12-27 MED ORDER — SODIUM CHLORIDE 0.9% FLUSH
10.0000 mL | Freq: Two times a day (BID) | INTRAVENOUS | Status: DC
  Administered 2020-12-27 – 2020-12-30 (×6): 10 mL

## 2020-12-27 MED ORDER — SODIUM CHLORIDE 0.45 % IV SOLN: INTRAVENOUS | Status: DC | PRN

## 2020-12-27 MED ORDER — MORPHINE SULFATE (PF) 2 MG/ML IV SOLN
1.0000 mg | INTRAVENOUS | Status: DC | PRN
  Administered 2020-12-28: 2 mg via INTRAVENOUS
  Filled 2020-12-27 (×2): qty 1

## 2020-12-27 MED ORDER — SODIUM CHLORIDE (PF) 0.9 % IJ SOLN
OROMUCOSAL | Status: DC | PRN
  Administered 2020-12-27 (×3): 4 mL via TOPICAL

## 2020-12-27 MED ORDER — CHLORHEXIDINE GLUCONATE CLOTH 2 % EX PADS
6.0000 | MEDICATED_PAD | Freq: Every day | CUTANEOUS | Status: DC
  Administered 2020-12-27 – 2021-01-05 (×10): 6 via TOPICAL

## 2020-12-27 MED ORDER — ACETAMINOPHEN 160 MG/5ML PO SOLN
1000.0000 mg | Freq: Four times a day (QID) | ORAL | Status: AC
  Filled 2020-12-27: qty 40.6

## 2020-12-27 MED ORDER — ASPIRIN EC 325 MG PO TBEC
325.0000 mg | DELAYED_RELEASE_TABLET | Freq: Every day | ORAL | Status: DC
  Administered 2020-12-28 – 2021-01-05 (×9): 325 mg via ORAL
  Filled 2020-12-27 (×9): qty 1

## 2020-12-27 MED ORDER — SODIUM CHLORIDE 0.9% FLUSH: 3.0000 mL | INTRAVENOUS | Status: DC | PRN

## 2020-12-27 MED ORDER — DEXTROSE 50 % IV SOLN
0.0000 mL | INTRAVENOUS | Status: DC | PRN
Start: 2020-12-27 — End: 2021-01-05
  Administered 2020-12-27: 50 mL via INTRAVENOUS
  Filled 2020-12-27: qty 50

## 2020-12-27 MED ORDER — PROTAMINE SULFATE 10 MG/ML IV SOLN
INTRAVENOUS | Status: AC
  Filled 2020-12-27: qty 25

## 2020-12-27 MED ORDER — MIDAZOLAM HCL (PF) 10 MG/2ML IJ SOLN
INTRAMUSCULAR | Status: AC
  Filled 2020-12-27: qty 2

## 2020-12-27 MED ORDER — POTASSIUM CHLORIDE 10 MEQ/50ML IV SOLN
10.0000 meq | INTRAVENOUS | Status: AC
  Administered 2020-12-27 – 2020-12-28 (×3): 10 meq via INTRAVENOUS
  Filled 2020-12-27 (×3): qty 50

## 2020-12-27 MED ORDER — ASPIRIN 81 MG PO CHEW: 324.0000 mg | CHEWABLE_TABLET | Freq: Every day | ORAL | Status: DC

## 2020-12-27 MED ORDER — NOREPINEPHRINE 4 MG/250ML-% IV SOLN
0.0000 ug/min | INTRAVENOUS | Status: DC
  Administered 2020-12-28: 4 ug/min via INTRAVENOUS
  Filled 2020-12-27: qty 250

## 2020-12-27 MED ORDER — DEXAMETHASONE SODIUM PHOSPHATE 10 MG/ML IJ SOLN
INTRAMUSCULAR | Status: DC | PRN
  Administered 2020-12-27: 10 mg via INTRAVENOUS

## 2020-12-27 MED ORDER — DOCUSATE SODIUM 100 MG PO CAPS
200.0000 mg | ORAL_CAPSULE | Freq: Every day | ORAL | Status: DC
  Administered 2020-12-28 – 2021-01-04 (×8): 200 mg via ORAL
  Filled 2020-12-27 (×8): qty 2

## 2020-12-27 MED ORDER — VANCOMYCIN HCL IN DEXTROSE 1-5 GM/200ML-% IV SOLN
1000.0000 mg | Freq: Once | INTRAVENOUS | Status: AC
  Administered 2020-12-27: 1000 mg via INTRAVENOUS
  Filled 2020-12-27: qty 200

## 2020-12-27 MED ORDER — ORAL CARE MOUTH RINSE
15.0000 mL | OROMUCOSAL | Status: DC
  Administered 2020-12-27 (×3): 15 mL via OROMUCOSAL

## 2020-12-27 MED ORDER — METOPROLOL TARTRATE 12.5 MG HALF TABLET
12.5000 mg | ORAL_TABLET | Freq: Two times a day (BID) | ORAL | Status: DC
  Administered 2020-12-28 – 2021-01-05 (×10): 12.5 mg via ORAL
  Filled 2020-12-27 (×14): qty 1

## 2020-12-27 MED ORDER — FAMOTIDINE IN NACL 20-0.9 MG/50ML-% IV SOLN
20.0000 mg | Freq: Two times a day (BID) | INTRAVENOUS | Status: AC
  Administered 2020-12-27: 20 mg via INTRAVENOUS
  Filled 2020-12-27: qty 50

## 2020-12-27 MED ORDER — HEPARIN SODIUM (PORCINE) 1000 UNIT/ML IJ SOLN
INTRAMUSCULAR | Status: DC | PRN
  Administered 2020-12-27: 21000 [IU] via INTRAVENOUS

## 2020-12-27 MED ORDER — POTASSIUM CHLORIDE 10 MEQ/50ML IV SOLN
10.0000 meq | INTRAVENOUS | Status: AC
  Administered 2020-12-27 (×3): 10 meq via INTRAVENOUS

## 2020-12-27 MED ORDER — PROTAMINE SULFATE 10 MG/ML IV SOLN
INTRAVENOUS | Status: DC | PRN
  Administered 2020-12-27: 50 mg via INTRAVENOUS
  Administered 2020-12-27: 160 mg via INTRAVENOUS

## 2020-12-27 MED ORDER — INSULIN REGULAR(HUMAN) IN NACL 100-0.9 UT/100ML-% IV SOLN: INTRAVENOUS | Status: DC

## 2020-12-27 MED ORDER — NICARDIPINE HCL IN NACL 20-0.86 MG/200ML-% IV SOLN
0.0000 mg/h | INTRAVENOUS | Status: DC
  Administered 2020-12-27: 2.5 mg/h via INTRAVENOUS
  Filled 2020-12-27: qty 200

## 2020-12-27 MED ORDER — DEXAMETHASONE SODIUM PHOSPHATE 10 MG/ML IJ SOLN
INTRAMUSCULAR | Status: AC
  Filled 2020-12-27: qty 1

## 2020-12-27 MED ORDER — FENTANYL CITRATE (PF) 250 MCG/5ML IJ SOLN
INTRAMUSCULAR | Status: AC
  Filled 2020-12-27: qty 25

## 2020-12-27 MED ORDER — SODIUM CHLORIDE 0.9 % IV SOLN: 250.0000 mL | INTRAVENOUS | Status: DC

## 2020-12-27 MED ORDER — SODIUM CHLORIDE 0.9% FLUSH: 10.0000 mL | INTRAVENOUS | Status: DC | PRN

## 2020-12-27 MED ORDER — HEPARIN SODIUM (PORCINE) 1000 UNIT/ML IJ SOLN
INTRAMUSCULAR | Status: AC
  Filled 2020-12-27: qty 1

## 2020-12-27 SURGICAL SUPPLY — 118 items
ADH SKN CLS APL DERMABOND .7 (GAUZE/BANDAGES/DRESSINGS) ×4
APPLIER CLIP 9.375 SM OPEN (CLIP) ×5
APR CLP SM 9.3 20 MLT OPN (CLIP) ×4
BAG DECANTER FOR FLEXI CONT (MISCELLANEOUS) ×5 IMPLANT
BLADE CLIPPER SURG (BLADE) IMPLANT
BLADE STERNUM SYSTEM 6 (BLADE) ×5 IMPLANT
BLADE SURG 11 STRL SS (BLADE) ×5 IMPLANT
BLADE SURG 15 STRL LF DISP TIS (BLADE) ×4 IMPLANT
BLADE SURG 15 STRL SS (BLADE) ×5
BLOWER MISTER CAL-MED (MISCELLANEOUS) ×5 IMPLANT
BNDG ELASTIC 4X5.8 VLCR STR LF (GAUZE/BANDAGES/DRESSINGS) ×5 IMPLANT
BNDG ELASTIC 6X5.8 VLCR STR LF (GAUZE/BANDAGES/DRESSINGS) ×5 IMPLANT
BNDG GAUZE ELAST 4 BULKY (GAUZE/BANDAGES/DRESSINGS) ×5 IMPLANT
CABLE SURGICAL S-101-97-12 (CABLE) ×5 IMPLANT
CANISTER SUCT 3000ML PPV (MISCELLANEOUS) ×5 IMPLANT
CANNULA MC2 2 STG 29/37 NON-V (CANNULA) ×4 IMPLANT
CANNULA MC2 TWO STAGE (CANNULA) ×1
CANNULA NON VENT 20FR 12 (CANNULA) ×5 IMPLANT
CATH ROBINSON RED A/P 18FR (CATHETERS) ×10 IMPLANT
CLIP APPLIE 9.375 SM OPEN (CLIP) ×4 IMPLANT
CLIP RETRACTION 3.0MM CORONARY (MISCELLANEOUS) IMPLANT
CLIP VESOCCLUDE MED 24/CT (CLIP) ×10 IMPLANT
CLIP VESOCCLUDE SM WIDE 24/CT (CLIP) ×25 IMPLANT
CNTNR URN SCR LID CUP LEK RST (MISCELLANEOUS) ×4 IMPLANT
CONN ST 1/2X1/2  BEN (MISCELLANEOUS) ×1
CONN ST 1/2X1/2 BEN (MISCELLANEOUS) ×4 IMPLANT
CONNECTOR BLAKE 2:1 CARIO BLK (MISCELLANEOUS) ×5 IMPLANT
CONT SPEC 4OZ STRL OR WHT (MISCELLANEOUS) ×5
CONTAINER PROTECT SURGISLUSH (MISCELLANEOUS) ×5 IMPLANT
COVER MAYO STAND STRL (DRAPES) ×5 IMPLANT
CUFF TOURN SGL QUICK 18X4 (TOURNIQUET CUFF) IMPLANT
CUFF TOURN SGL QUICK 24 (TOURNIQUET CUFF)
CUFF TRNQT CYL 24X4X16.5-23 (TOURNIQUET CUFF) IMPLANT
DERMABOND ADVANCED (GAUZE/BANDAGES/DRESSINGS) ×1
DERMABOND ADVANCED .7 DNX12 (GAUZE/BANDAGES/DRESSINGS) ×4 IMPLANT
DRAIN CHANNEL 19F RND (DRAIN) ×15 IMPLANT
DRAIN CONNECTOR BLAKE 1:1 (MISCELLANEOUS) ×5 IMPLANT
DRAPE CARDIOVASCULAR INCISE (DRAPES) ×5
DRAPE EXTREMITY T 121X128X90 (DISPOSABLE) ×5 IMPLANT
DRAPE HALF SHEET 40X57 (DRAPES) ×5 IMPLANT
DRAPE INCISE IOBAN 66X45 STRL (DRAPES) IMPLANT
DRAPE SRG 135X102X78XABS (DRAPES) ×4 IMPLANT
DRAPE WARM FLUID 44X44 (DRAPES) ×5 IMPLANT
DRSG AQUACEL AG ADV 3.5X10 (GAUZE/BANDAGES/DRESSINGS) ×5 IMPLANT
DRSG AQUACEL AG ADV 3.5X14 (GAUZE/BANDAGES/DRESSINGS) ×5 IMPLANT
DRSG COVADERM 4X14 (GAUZE/BANDAGES/DRESSINGS) ×5 IMPLANT
DRSG TEGADERM 4X4.75 (GAUZE/BANDAGES/DRESSINGS) ×10 IMPLANT
DRSG TELFA 3X8 NADH (GAUZE/BANDAGES/DRESSINGS) ×10 IMPLANT
ELECT BLADE 4.0 EZ CLEAN MEGAD (MISCELLANEOUS) ×10
ELECT REM PT RETURN 9FT ADLT (ELECTROSURGICAL) ×10
ELECTRODE BLDE 4.0 EZ CLN MEGD (MISCELLANEOUS) ×8 IMPLANT
ELECTRODE REM PT RTRN 9FT ADLT (ELECTROSURGICAL) ×8 IMPLANT
FELT TEFLON 1X6 (MISCELLANEOUS) ×5 IMPLANT
GAUZE 4X4 16PLY ~~LOC~~+RFID DBL (SPONGE) ×5 IMPLANT
GAUZE SPONGE 4X4 12PLY STRL (GAUZE/BANDAGES/DRESSINGS) ×10 IMPLANT
GAUZE SPONGE 4X4 12PLY STRL LF (GAUZE/BANDAGES/DRESSINGS) ×10 IMPLANT
GEL ULTRASOUND 20GR AQUASONIC (MISCELLANEOUS) ×5 IMPLANT
GLOVE SURG ENC MOIS LTX SZ7 (GLOVE) ×10 IMPLANT
GLOVE SURG ENC TEXT LTX SZ7.5 (GLOVE) ×10 IMPLANT
GLOVE SURG MICRO LTX SZ6.5 (GLOVE) ×20 IMPLANT
GLOVE SURG MICRO LTX SZ7.5 (GLOVE) ×5 IMPLANT
GLOVE SURG UNDER POLY LF SZ6.5 (GLOVE) ×20 IMPLANT
GOWN STRL REUS W/ TWL LRG LVL3 (GOWN DISPOSABLE) ×40 IMPLANT
GOWN STRL REUS W/ TWL XL LVL3 (GOWN DISPOSABLE) ×8 IMPLANT
GOWN STRL REUS W/TWL LRG LVL3 (GOWN DISPOSABLE) ×50
GOWN STRL REUS W/TWL XL LVL3 (GOWN DISPOSABLE) ×10
HEMOSTAT POWDER SURGIFOAM 1G (HEMOSTASIS) ×15 IMPLANT
INSERT SUTURE HOLDER (MISCELLANEOUS) ×5 IMPLANT
KIT BASIN OR (CUSTOM PROCEDURE TRAY) ×5 IMPLANT
KIT DRAINAGE VACCUM ASSIST (KITS) ×5 IMPLANT
KIT SUCTION CATH 14FR (SUCTIONS) ×5 IMPLANT
KIT TURNOVER KIT B (KITS) ×10 IMPLANT
KIT VASOVIEW HEMOPRO 2 VH 4000 (KITS) ×5 IMPLANT
LEAD PACING MYOCARDI (MISCELLANEOUS) ×5 IMPLANT
MARKER GRAFT CORONARY BYPASS (MISCELLANEOUS) ×15 IMPLANT
NS IRRIG 1000ML POUR BTL (IV SOLUTION) ×25 IMPLANT
OFFPUMP STABILIZER SUV (MISCELLANEOUS) ×5 IMPLANT
PACK ACCESSORY CANNULA KIT (KITS) ×5 IMPLANT
PACK E OPEN HEART (SUTURE) ×5 IMPLANT
PACK OPEN HEART (CUSTOM PROCEDURE TRAY) ×5 IMPLANT
PAD ARMBOARD 7.5X6 YLW CONV (MISCELLANEOUS) ×20 IMPLANT
PAD ELECT DEFIB RADIOL ZOLL (MISCELLANEOUS) ×5 IMPLANT
PENCIL BUTTON HOLSTER BLD 10FT (ELECTRODE) ×5 IMPLANT
POSITIONER ACROBAT-I OFFPUMP (MISCELLANEOUS) ×5 IMPLANT
POSITIONER HEAD DONUT 9IN (MISCELLANEOUS) ×5 IMPLANT
PUNCH AORTIC ROTATE 4.0MM (MISCELLANEOUS) ×10 IMPLANT
SET MPS 3-ND DEL (MISCELLANEOUS) ×5 IMPLANT
SHEARS HARMONIC 9CM CVD (BLADE) ×5 IMPLANT
SHUNT CORONARY AXIUS 1.0 (MISCELLANEOUS) ×5 IMPLANT
SPONGE T-LAP 18X18 ~~LOC~~+RFID (SPONGE) ×20 IMPLANT
SPONGE T-LAP 4X18 ~~LOC~~+RFID (SPONGE) ×5 IMPLANT
SUPPORT HEART JANKE-BARRON (MISCELLANEOUS) ×5 IMPLANT
SUT BONE WAX W31G (SUTURE) ×5 IMPLANT
SUT ETHIBOND X763 2 0 SH 1 (SUTURE) ×10 IMPLANT
SUT MNCRL AB 3-0 PS2 18 (SUTURE) ×10 IMPLANT
SUT MNCRL AB 4-0 PS2 18 (SUTURE) ×5 IMPLANT
SUT MON AB 5-0 P3 18 (SUTURE) ×5 IMPLANT
SUT PDS AB 1 CTX 36 (SUTURE) ×10 IMPLANT
SUT PROLENE 4 0 SH DA (SUTURE) ×5 IMPLANT
SUT PROLENE 5 0 C 1 36 (SUTURE) ×30 IMPLANT
SUT PROLENE 7 0 BV 1 (SUTURE) ×5 IMPLANT
SUT PROLENE 7 0 BV1 MDA (SUTURE) ×10 IMPLANT
SUT STEEL 6MS V (SUTURE) ×10 IMPLANT
SUT VIC AB 2-0 CT1 27 (SUTURE) ×10
SUT VIC AB 2-0 CT1 TAPERPNT 27 (SUTURE) ×8 IMPLANT
SUT VIC AB 3-0 SH 27 (SUTURE)
SUT VIC AB 3-0 SH 27X BRD (SUTURE) IMPLANT
SUT VIC AB 3-0 X1 27 (SUTURE) IMPLANT
SYR 50ML SLIP (SYRINGE) IMPLANT
SYSTEM SAHARA CHEST DRAIN ATS (WOUND CARE) ×5 IMPLANT
TAPE PAPER 3X10 WHT MICROPORE (GAUZE/BANDAGES/DRESSINGS) ×5 IMPLANT
TAPES RETRACTO (MISCELLANEOUS) ×10 IMPLANT
TOWEL GREEN STERILE (TOWEL DISPOSABLE) ×10 IMPLANT
TOWEL GREEN STERILE FF (TOWEL DISPOSABLE) ×10 IMPLANT
TRAY FOLEY SLVR 16FR TEMP STAT (SET/KITS/TRAYS/PACK) ×5 IMPLANT
TUBING LAP HI FLOW INSUFFLATIO (TUBING) ×5 IMPLANT
UNDERPAD 30X36 HEAVY ABSORB (UNDERPADS AND DIAPERS) ×10 IMPLANT
WATER STERILE IRR 1000ML POUR (IV SOLUTION) ×10 IMPLANT

## 2020-12-27 NOTE — Anesthesia Procedure Notes (Signed)
Central Venous Catheter Insertion Performed by: Heather Roberts, MD, anesthesiologist Start/End7/14/2022 6:58 AM, 12/27/2020 7:08 AM Patient location: Pre-op. Preanesthetic checklist: patient identified, IV checked, site marked, risks and benefits discussed, surgical consent, monitors and equipment checked, pre-op evaluation, timeout performed and anesthesia consent Position: Trendelenburg Lidocaine 1% used for infiltration and patient sedated Hand hygiene performed , maximum sterile barriers used  and Seldinger technique used Catheter size: 8.5 Fr Total catheter length 8. Central line was placed.Sheath introducer Procedure performed using ultrasound guided technique. Ultrasound Notes:anatomy identified, needle tip was noted to be adjacent to the nerve/plexus identified, no ultrasound evidence of intravascular and/or intraneural injection and image(s) printed for medical record Attempts: 1 Following insertion, line sutured, dressing applied and Biopatch. Post procedure assessment: blood return through all ports, free fluid flow and no air  Patient tolerated the procedure well with no immediate complications. Additional procedure comments: Initial attempt on right side.  Wire clearly in right IJ, unable to pass wire past 3 cm into vein.  Moved to left, catheter placed without problems.Marland Kitchen

## 2020-12-27 NOTE — Anesthesia Procedure Notes (Addendum)
Arterial Line Insertion Start/End7/14/2022 6:43 AM Performed by: Nils Pyle, CRNA, CRNA  Patient location: Pre-op. Preanesthetic checklist: patient identified, IV checked, site marked, risks and benefits discussed, surgical consent, monitors and equipment checked, pre-op evaluation and anesthesia consent Lidocaine 1% used for infiltration Right, radial was placed Catheter size: 20 Fr Hand hygiene performed  and maximum sterile barriers used   Attempts: 1 Procedure performed without using ultrasound guided technique. Following insertion, dressing applied and Biopatch. Post procedure assessment: normal and unchanged  Patient tolerated the procedure well with no immediate complications. Additional procedure comments: Placed by Olena Heckle.

## 2020-12-27 NOTE — Anesthesia Postprocedure Evaluation (Signed)
Anesthesia Post Note  Patient: [name]  Procedure(s) Performed: CORONARY ARTERY BYPASS GRAFTING (CABG) X FOUR ON PUMP USING LEFT INTERNAL MAMMARY ARTERY AND RIGHT ENDOSCOPIC GREATER SAPHENOUS VEIN CONDUITS (Chest) TRANSESOPHAGEAL ECHOCARDIOGRAM (TEE) APPLICATION OF CELL SAVER     Patient location during evaluation: PACU Anesthesia Type: General Level of consciousness: sedated Pain management: pain level controlled Vital Signs Assessment: post-procedure vital signs reviewed and stable Respiratory status: spontaneous breathing and respiratory function stable Cardiovascular status: stable Postop Assessment: no apparent nausea or vomiting Anesthetic complications: no   No notable events documented.  Last Vitals:  Vitals:   12/27/20 0433 12/27/20 1410  BP: 133/81 125/77  Pulse: 89 89  Resp: 19 (!) 24  Temp: 36.6 C   SpO2: 100% 100%    Last Pain:  Vitals:   12/27/20 0433  TempSrc: Oral  PainSc:                  Mykeisha Dysert DANIEL

## 2020-12-27 NOTE — Transfer of Care (Signed)
Immediate Anesthesia Transfer of Care Note  Patient: [name]  Procedure(s) Performed: CORONARY ARTERY BYPASS GRAFTING (CABG) X FOUR ON PUMP USING LEFT INTERNAL MAMMARY ARTERY AND RIGHT ENDOSCOPIC GREATER SAPHENOUS VEIN CONDUITS (Chest) TRANSESOPHAGEAL ECHOCARDIOGRAM (TEE) APPLICATION OF CELL SAVER  Patient Location: ICU  Anesthesia Type:General  Level of Consciousness: sedated, unresponsive and Patient remains intubated per anesthesia plan  Airway & Oxygen Therapy: Patient remains intubated per anesthesia plan and Patient placed on Ventilator (see vital sign flow sheet for setting)  Post-op Assessment: Report given to RN and Post -op Vital signs reviewed and stable  Post vital signs: Reviewed and stable  Last Vitals:  Vitals Value Taken Time  BP    Temp    Pulse 88 12/27/20 1406  Resp 25 12/27/20 1406  SpO2 100 % 12/27/20 1406  Vitals shown include unvalidated device data.  Last Pain:  Vitals:   12/27/20 0433  TempSrc: Oral  PainSc:       Patients Stated Pain Goal: 0 (12/26/20 2000)  Complications: No notable events documented.

## 2020-12-27 NOTE — Progress Notes (Signed)
     [address]       [place] [address]             [phone]       No events Vitals:   12/26/20 1951 12/27/20 0433  BP: 130/69 133/81  Pulse: 83 89  Resp: 15 19  Temp: 98.3 F (36.8 C) 97.8 F (36.6 C)  SpO2: 100% 100%   Alert NAD Sinus EWOB  OR today for CABG and left radial artery harvest  [name]

## 2020-12-27 NOTE — Anesthesia Procedure Notes (Addendum)
Procedure Name: Intubation Date/Time: 12/27/2020 7:56 AM Performed by: Nils Pyle, CRNA Pre-anesthesia Checklist: Patient identified, Emergency Drugs available, Suction available and Patient being monitored Patient Re-evaluated:Patient Re-evaluated prior to induction Oxygen Delivery Method: Circle System Utilized Preoxygenation: Pre-oxygenation with 100% oxygen Induction Type: IV induction Ventilation: Mask ventilation without difficulty Laryngoscope Size: Miller and 3 Grade View: Grade I Tube type: Oral Tube size: 8.0 mm Number of attempts: 1 Airway Equipment and Method: Stylet and Oral airway Placement Confirmation: ETT inserted through vocal cords under direct vision, positive ETCO2 and breath sounds checked- equal and bilateral Secured at: 22 cm Tube secured with: Tape Dental Injury: Teeth and Oropharynx as per pre-operative assessment  Comments: Right lower tooth loose in pre-op, intact following intubation Intubation by Olena Heckle

## 2020-12-27 NOTE — Brief Op Note (Signed)
12/24/2020 - 12/27/2020  8:19 AM  PATIENT:  [name]  55 y.o. male  PRE-OPERATIVE DIAGNOSIS:  CORONARY ARTERY DISEASE  POST-OPERATIVE DIAGNOSIS:  CORONARY ARTERY DISEASE  PROCEDURE:  Procedure(s): CORONARY ARTERY BYPASS GRAFTING (CABG) X FOUR ON PUMP USING LEFT INTERNAL MAMMARY ARTERY AND RIGHT ENDOSCOPIC GREATER SAPHENOUS VEIN CONDUITS (N/A) TRANSESOPHAGEAL ECHOCARDIOGRAM (TEE) (N/A) APPLICATION OF CELL SAVER LIMA-LAD SVG-PD SVG-OM3 SVG-DIAG   EVH TIME: 40 MIN, PREP 15 MIN (DZ) LEFT RADIAL EXPLORATION: NO PALMER ARCH FLOW WITH CLAMP BY DOPPLER, THEREFORE NOT HARVESTED  SURGEON:  Surgeon(s) and Role:    * Lightfoot, Eliezer Lofts, MD - Primary  PHYSICIAN ASSISTANT: Reika Callanan PA-C; DONIELLE ZIMMERMAN PA-C  ASSISTANTS: STAFF   ANESTHESIA:   general  EBL:  433 mL   BLOOD ADMINISTERED:  DRAINS:  LEFT PLEURAL AND MEDIASTINAL CHEST TUBES   LOCAL MEDICATIONS USED:  NONE  SPECIMEN:  No Specimen  DISPOSITION OF SPECIMEN:  N/A  COUNTS:  YES  TOURNIQUET:  * No tourniquets in log *  DICTATION: .Dragon Dictation  PLAN OF CARE: Admit to inpatient   PATIENT DISPOSITION:  ICU - intubated and hemodynamically stable.   Delay start of Pharmacological VTE agent (>24hrs) due to surgical blood loss or risk of bleeding: yes  COMPLICATIONS: NO KNOWN

## 2020-12-27 NOTE — Progress Notes (Signed)
EKG CRITICAL VALUE     12 lead EKG performed.  Critical value noted.  Myrtha Mantis, RN notified.   Lanae Boast, CCT 12/27/2020 2:57 PM

## 2020-12-27 NOTE — TOC Initial Note (Signed)
Transition of Care [facility]) - Initial/Assessment Note    Patient Details  Name: [name] MRN: [id number] Date of Birth: 1966-05-15  [facility]) CM/SW Contact:    Lockie Pares, RN Phone Number: 12/27/2020, 3:49 PM  Clinical Narrative:                 Assessed for chest pain, cath revealed 3 vessel disease. Had CABG x4 today. Lives at home with spouse, is active. EDD 7/18 May need HH depending on activity level closer to DC CM will follow for needs and transitions.   Expected Discharge Plan: Home w Home Health Services Barriers to Discharge: Continued Medical Work up   Patient Goals and CMS Choice        Expected Discharge Plan and Services Expected Discharge Plan: Home w Home Health Services   Discharge Planning Services: CM Consult   Living arrangements for the past 2 months: Single Family Home                                      Prior Living Arrangements/Services Living arrangements for the past 2 months: Single Family Home Lives with:: Spouse Patient language and need for interpreter reviewed:: Yes        Need for Family Participation in Patient Care: Yes (Comment) Care giver support system in place?: Yes (comment)   Criminal Activity/Legal Involvement Pertinent to Current Situation/Hospitalization: No - Comment as needed  Activities of Daily Living Home Assistive Devices/Equipment: CBG Meter, Cane (specify quad or straight) ADL Screening (condition at time of admission) Patient's cognitive ability adequate to safely complete daily activities?: Yes Is the patient deaf or have difficulty hearing?: No Does the patient have difficulty seeing, even when wearing glasses/contacts?: No Does the patient have difficulty concentrating, remembering, or making decisions?: No Patient able to express need for assistance with ADLs?: No Does the patient have difficulty dressing or bathing?: No Independently performs ADLs?: Yes (appropriate for  developmental age) Does the patient have difficulty walking or climbing stairs?: No Weakness of Legs: None Weakness of Arms/Hands: None  Permission Sought/Granted                  Emotional Assessment   Attitude/Demeanor/Rapport: Intubated (Following Commands or Not Following Commands) Affect (typically observed): Unable to Assess   Alcohol / Substance Use: Not Applicable Psych Involvement: No (comment)  Admission diagnosis:  Unstable angina (HCC) [I20.0] S/P CABG x 4 [Z95.1] Patient Active Problem List   Diagnosis Date Noted   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 [place] - [address] [facility] [place] [address] Phone: [phone] Fax: [phone]     Social Determinants of Health (SDOH) Interventions    Readmission Risk Interventions No flowsheet data found.

## 2020-12-27 NOTE — Procedures (Addendum)
Extubation Procedure Note  Patient Details:   Name: Blair Lundeen DOB: 1965/11/06 MRN: [id number]   Airway Documentation:  Airway 8 mm (Active)  Secured at (cm) 21 cm 12/27/20 1942  Measured From Lips 12/27/20 1942  Secured Location Right 12/27/20 1942  Secured By [employer] Tape 12/27/20 1942  Prone position No 12/27/20 1942  Cuff Pressure (cm H2O) Clear OR 27-39 CmH2O 12/27/20 1942  Site Condition Dry 12/27/20 1942   Vent end date: (not recorded) Vent end time: (not recorded)   Evaluation  O2 sats: stable throughout Complications: No apparent complications Patient did tolerate procedure well. Bilateral Breath Sounds: Clear, Diminished   Yes Patient extubated to 5L [place] sat 100% R17. Patient able to achieve VC- 690, nif -20. Positive cuff leak. Patient able to say name. No stridor noted. Extubate per Dr. Terrence Dupont). RT will continue to monitor.  Milta Deiters 12/27/2020, 9:39 PM

## 2020-12-27 NOTE — Op Note (Addendum)
[address]       [name] [address]             [phone]                                          12/27/2020 Patient:  [name] Pre-Op Dx: Three-vessel coronary artery disease   NSTEMI   Diabetes   Hypertension Post-op Dx: Same Procedure: CABG X 4.  LIMA to LAD, reverse saphenous vein graft to PDA, OM 3/posterior lateral branch, diagonal. Endoscopic greater saphenous vein harvest on the right   Surgeon and Role:      * Jaeden Messer, Eliezer Lofts, MD - Primary    *D. Joycelyn Man, PA-C- assisting Assistant: Webb Laws, PA-C  Anesthesia  general EBL: 500 ml Blood Administration: None Xclamp Time: 18 min Pump Time: 166 min  Drains: 19 F blake drain: L, mediastinal  Wires: None Counts: correct   Indications: 55 year old male with three-vessel coronary disease.  He presents with worsening chest exertional dyspnea and dizziness.  Left heart cath showed severe three-vessel coronary disease.  I will plan for CABG with left radial artery harvest on 12/27/2020.  He is agreeable to proceed. Findings: We originally planned to harvest the left radial artery but upon Doppler there was not a good palmar arch signal with clamping of the radial artery.  We then proceeded to take 3 pieces of vein.  The patient received a total of 2 L of KB C cardioplegia solution but we were unable to achieve complete arrest of the heart.  After the first liter the cross-clamp was repositioned and another 500 mL was given with similar result.  The cross-clamp was position once more and another full dose was given and we still were not able to achieve complete arrest.  For this reason we elected to perform the procedure with the heart warm and beating.  The LIMA and vein was good conduit.  The LAD was small and heavily calcified.  The PDA was also small and calcified.  The OM 3/posterior lateral branch was a small target.  It was covered in scar.  The diagonal was also heavily  calcified.  The patient was vasa plegic at the start of the case.  We were able to separate from cardiopulmonary bypass on the background dose of vasopressin with some Levophed.  He was very volume responsive.  Operative Technique: All invasive lines were placed in pre-op holding.  After the risks, benefits and alternatives were thoroughly discussed, the patient was brought to the operative theatre.  Anesthesia was induced, and the patient was prepped and draped in normal sterile fashion.  An appropriate surgical pause was performed, and pre-operative antibiotics were dosed accordingly.  We began with simultaneous incisions along the right leg for harvesting of the greater saphenous vein and the chest for the sternotomy.  A small incision was made in the left arm to expose the radial artery, but upon clamping there was no palmar arch signal.  We aborted harvesting of the radial artery in close incision.  In regards to the sternotomy, this was carried down with bovie cautery, and the sternum was divided with a reciprocating saw.  Meticulous hemostasis was obtained.  The left internal thoracic artery was exposed and harvested in in pedicled fashion.  The patient was systemically heparinized, and the artery was divided distally, and placed  in a papaverine sponge.    The sternal elevator was removed, and a retractor was placed.  The pericardium was divided in the midline and fashioned into a cradle with pericardial stitches.   After we confirmed an appropriate ACT, the ascending aorta was cannulated in standard fashion.  The right atrial appendage was used for venous cannulation site.  Cardiopulmonary bypass was initiated, and the heart retractor was placed. The cross clamp was applied, and a dose of anterograde cardioplegia was given, but we were unable to achieve arrest of the heart.  The aortic cross-clamp was positioned twice and another full dose was given, but there was still activity.  For this reason we  opened the off-pump stabilizers and positioners to perform this warm and beating after the aortic cross-clamp was removed.  The anterior wall of the heart was exposed in the LAD was identified.  An arteriotomy was created, and we anastomose the LIMA in an end-to-side fashion to the LAD.   We then moved to the posterior wall of the heart, and found a good target on the PDA.  An arteriotomy was made, and the vein graft was anastomosed to it in an end to side fashion.  Next we exposed the lateral wall, and found a good target on the OM 3/posterior lateral branch.  An end to side anastomosis with the vein graft was then created.  Next, we exposed the anterior wall of the heart and identified a good target on first diagonal.   An arteriotomy was created.  The vein was anastomosed in an end to side fashion.  We began to fully re-warm.  Meticulous hemostasis was obtained.    A partial occludding clamp was then placed on the ascending aorta, and we created an end to side anastomosis between it and the proximal vein grafts.  The proximal sites were marked with rings.  Hemostasis was obtained, and we separated from cardiopulmonary bypass without event.  The heparin was reversed with protamine.  Chest tubes and wires were placed, and the sternum was re-approximated with sternal wires.  The soft tissue and skin were re-approximated wth absorbable suture.    The patient tolerated the procedure without any immediate complications, and was transferred to the ICU in guarded condition.  Mylon Mabey Keane Scrape

## 2020-12-28 ENCOUNTER — Inpatient Hospital Stay (HOSPITAL_COMMUNITY): Payer: Medicaid Other

## 2020-12-28 ENCOUNTER — Encounter (HOSPITAL_COMMUNITY): Payer: Self-pay | Admitting: Thoracic Surgery (Cardiothoracic Vascular Surgery)

## 2020-12-28 LAB — BASIC METABOLIC PANEL
Anion gap: 5 (ref 5–15)
Anion gap: 5 (ref 5–15)
BUN: 14 mg/dL (ref 6–20)
BUN: 18 mg/dL (ref 6–20)
CO2: 20 mmol/L — ABNORMAL LOW (ref 22–32)
CO2: 22 mmol/L (ref 22–32)
Calcium: 8.3 mg/dL — ABNORMAL LOW (ref 8.9–10.3)
Calcium: 8.1 mg/dL — ABNORMAL LOW (ref 8.9–10.3)
Chloride: 112 mmol/L — ABNORMAL HIGH (ref 98–111)
Chloride: 110 mmol/L (ref 98–111)
Creatinine, Ser: 0.73 mg/dL (ref 0.61–1.24)
Creatinine, Ser: 0.74 mg/dL (ref 0.61–1.24)
GFR, Estimated: >60 mL/min (ref >60)
GFR, Estimated: >60 mL/min (ref >60)
Glucose, Bld: 131 mg/dL — ABNORMAL HIGH (ref 70–99)
Glucose, Bld: 140 mg/dL — ABNORMAL HIGH (ref 70–99)
Potassium: 4.9 mmol/L (ref 3.5–5.1)
Potassium: 4.3 mmol/L (ref 3.5–5.1)
Sodium: 137 mmol/L (ref 135–145)
Sodium: 137 mmol/L (ref 135–145)

## 2020-12-28 LAB — GLUCOSE, CAPILLARY
Glucose-Capillary: 101 mg/dL — ABNORMAL HIGH (ref 70–99)
Glucose-Capillary: 102 mg/dL — ABNORMAL HIGH (ref 70–99)
Glucose-Capillary: 111 mg/dL — ABNORMAL HIGH (ref 70–99)
Glucose-Capillary: 112 mg/dL — ABNORMAL HIGH (ref 70–99)
Glucose-Capillary: 115 mg/dL — ABNORMAL HIGH (ref 70–99)
Glucose-Capillary: 117 mg/dL — ABNORMAL HIGH (ref 70–99)
Glucose-Capillary: 118 mg/dL — ABNORMAL HIGH (ref 70–99)
Glucose-Capillary: 122 mg/dL — ABNORMAL HIGH (ref 70–99)
Glucose-Capillary: 123 mg/dL — ABNORMAL HIGH (ref 70–99)
Glucose-Capillary: 128 mg/dL — ABNORMAL HIGH (ref 70–99)
Glucose-Capillary: 131 mg/dL — ABNORMAL HIGH (ref 70–99)
Glucose-Capillary: 133 mg/dL — ABNORMAL HIGH (ref 70–99)
Glucose-Capillary: 134 mg/dL — ABNORMAL HIGH (ref 70–99)
Glucose-Capillary: 144 mg/dL — ABNORMAL HIGH (ref 70–99)
Glucose-Capillary: 146 mg/dL — ABNORMAL HIGH (ref 70–99)
Glucose-Capillary: 151 mg/dL — ABNORMAL HIGH (ref 70–99)

## 2020-12-28 LAB — CBC
HCT: 22.7 % — ABNORMAL LOW (ref 39.0–52.0)
HCT: 22.8 % — ABNORMAL LOW (ref 39.0–52.0)
Hemoglobin: 7.8 g/dL — ABNORMAL LOW (ref 13.0–17.0)
Hemoglobin: 7.8 g/dL — ABNORMAL LOW (ref 13.0–17.0)
MCH: 29.9 pg (ref 26.0–34.0)
MCH: 30 pg (ref 26.0–34.0)
MCHC: 34.4 g/dL (ref 30.0–36.0)
MCHC: 34.2 g/dL (ref 30.0–36.0)
MCV: 87 fL (ref 80.0–100.0)
MCV: 87.7 fL (ref 80.0–100.0)
Platelets: 133 10*3/uL — ABNORMAL LOW (ref 150–400)
Platelets: 101 10*3/uL — ABNORMAL LOW (ref 150–400)
RBC: 2.61 MIL/uL — ABNORMAL LOW (ref 4.22–5.81)
RBC: 2.6 MIL/uL — ABNORMAL LOW (ref 4.22–5.81)
RDW: 13.4 % (ref 11.5–15.5)
RDW: 13.6 % (ref 11.5–15.5)
WBC: 10.2 10*3/uL (ref 4.0–10.5)
WBC: 7.4 10*3/uL (ref 4.0–10.5)
nRBC: 0 % (ref 0.0–0.2)
nRBC: 0 % (ref 0.0–0.2)

## 2020-12-28 LAB — POCT I-STAT 7, (LYTES, BLD GAS, ICA,H+H)
Acid-base deficit: 6 mmol/L — ABNORMAL HIGH (ref 0.0–2.0)
Bicarbonate: 16.8 mmol/L — ABNORMAL LOW (ref 20.0–28.0)
Calcium, Ion: 1.16 mmol/L (ref 1.15–1.40)
HCT: 21 % — ABNORMAL LOW (ref 39.0–52.0)
Hemoglobin: 7.1 g/dL — ABNORMAL LOW (ref 13.0–17.0)
O2 Saturation: 98 %
Patient temperature: 37.4
Potassium: 4.7 mmol/L (ref 3.5–5.1)
Sodium: 144 mmol/L (ref 135–145)
TCO2: 17 mmol/L — ABNORMAL LOW (ref 22–32)
pCO2 arterial: 22.3 mmHg — ABNORMAL LOW (ref 32.0–48.0)
pH, Arterial: 7.485 — ABNORMAL HIGH (ref 7.350–7.450)
pO2, Arterial: 92 mmHg (ref 83.0–108.0)

## 2020-12-28 LAB — MAGNESIUM
Magnesium: 2.2 mg/dL (ref 1.7–2.4)
Magnesium: 2.3 mg/dL (ref 1.7–2.4)

## 2020-12-28 IMAGING — DX DG CHEST 1V PORT
1 series · 1 of 1 positions shown · non-contrast
Comparison: [DATE]

CLINICAL DATA: Post CABG

EXAM:
PORTABLE CHEST 1 VIEW

[chest]
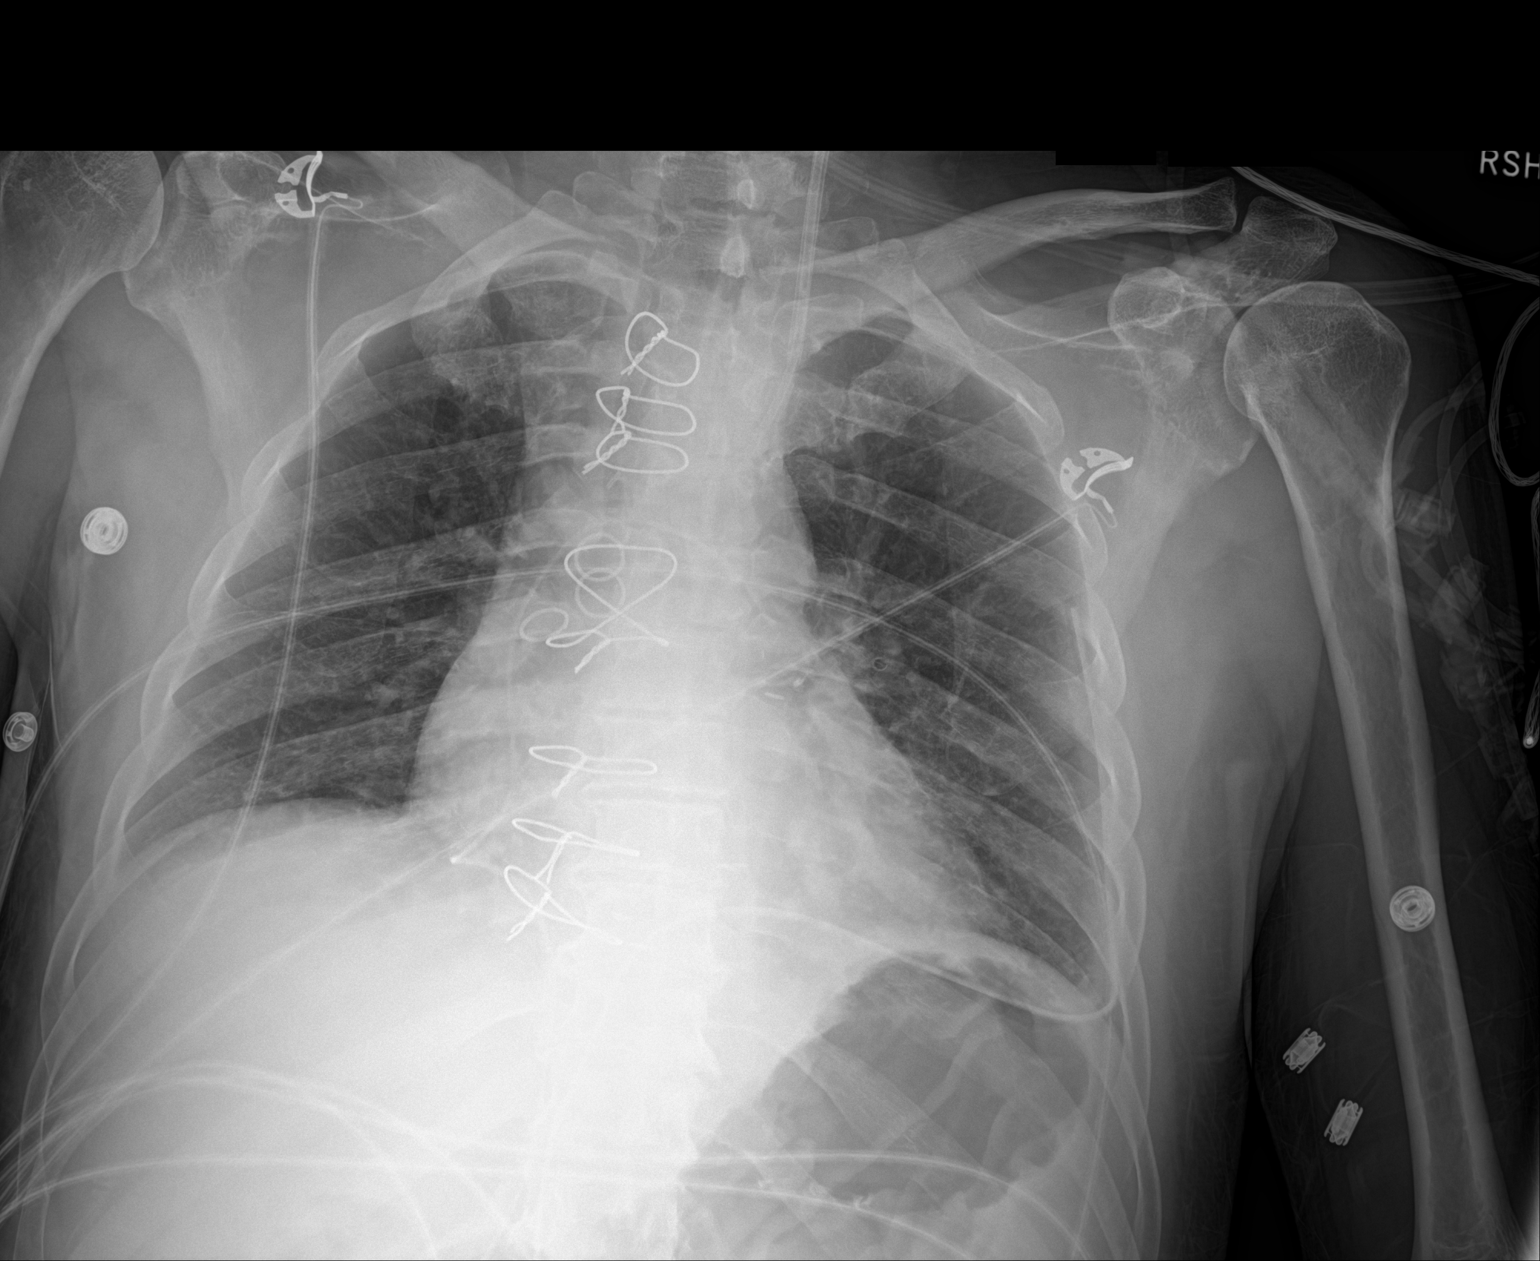

[1 of 1 positions shown; findings below may reference images not displayed]

FINDINGS: Endotracheal and enteric tubes are no longer present. Left chest
tube and probable mediastinal drain again noted. Left IJ central
line within left innominate as before. No pneumothorax. Lungs are
clear. Stable cardiomediastinal contours. Post CABG.
IMPRESSION: Lines and tubes as above.  No pneumothorax.  Clear lungs.

## 2020-12-28 MED ORDER — ALBUMIN HUMAN 5 % IV SOLN
25.0000 g | Freq: Once | INTRAVENOUS | Status: AC
  Administered 2020-12-28: 25 g via INTRAVENOUS
  Filled 2020-12-28: qty 500

## 2020-12-28 MED ORDER — ENOXAPARIN SODIUM 40 MG/0.4ML IJ SOSY
40.0000 mg | PREFILLED_SYRINGE | Freq: Every day | INTRAMUSCULAR | Status: DC
  Administered 2020-12-28 – 2021-01-04 (×8): 40 mg via SUBCUTANEOUS
  Filled 2020-12-28 (×8): qty 0.4

## 2020-12-28 MED ORDER — INSULIN ASPART 100 UNIT/ML IJ SOLN
0.0000 [IU] | INTRAMUSCULAR | Status: DC
  Administered 2020-12-28 – 2020-12-30 (×9): 2 [IU] via SUBCUTANEOUS

## 2020-12-28 MED ORDER — MIDODRINE HCL 5 MG PO TABS
5.0000 mg | ORAL_TABLET | Freq: Three times a day (TID) | ORAL | Status: DC
  Administered 2020-12-28 – 2021-01-02 (×15): 5 mg via ORAL
  Filled 2020-12-28 (×16): qty 1

## 2020-12-28 NOTE — Progress Notes (Signed)
     [address]       [name] [address]             [phone]      Full note to follow Agitated overnight Extubated  Albumin, and midodrine today Will wean levo Continue progression  Mihaela Fajardo O Shonice Wrisley

## 2020-12-29 ENCOUNTER — Inpatient Hospital Stay (HOSPITAL_COMMUNITY): Payer: Medicaid Other

## 2020-12-29 LAB — BASIC METABOLIC PANEL
Anion gap: 5 (ref 5–15)
BUN: 19 mg/dL (ref 6–20)
CO2: 22 mmol/L (ref 22–32)
Calcium: 8.2 mg/dL — ABNORMAL LOW (ref 8.9–10.3)
Chloride: 110 mmol/L (ref 98–111)
Creatinine, Ser: 0.78 mg/dL (ref 0.61–1.24)
GFR, Estimated: >60 mL/min (ref >60)
Glucose, Bld: 135 mg/dL — ABNORMAL HIGH (ref 70–99)
Potassium: 4.2 mmol/L (ref 3.5–5.1)
Sodium: 137 mmol/L (ref 135–145)

## 2020-12-29 LAB — GLUCOSE, CAPILLARY
Glucose-Capillary: 127 mg/dL — ABNORMAL HIGH (ref 70–99)
Glucose-Capillary: 101 mg/dL — ABNORMAL HIGH (ref 70–99)
Glucose-Capillary: 136 mg/dL — ABNORMAL HIGH (ref 70–99)
Glucose-Capillary: 109 mg/dL — ABNORMAL HIGH (ref 70–99)
Glucose-Capillary: 109 mg/dL — ABNORMAL HIGH (ref 70–99)
Glucose-Capillary: 137 mg/dL — ABNORMAL HIGH (ref 70–99)

## 2020-12-29 LAB — CBC
HCT: 24.7 % — ABNORMAL LOW (ref 39.0–52.0)
Hemoglobin: 8.4 g/dL — ABNORMAL LOW (ref 13.0–17.0)
MCH: 29.6 pg (ref 26.0–34.0)
MCHC: 34 g/dL (ref 30.0–36.0)
MCV: 87 fL (ref 80.0–100.0)
Platelets: 132 10*3/uL — ABNORMAL LOW (ref 150–400)
RBC: 2.84 MIL/uL — ABNORMAL LOW (ref 4.22–5.81)
RDW: 13.7 % (ref 11.5–15.5)
WBC: 9.6 10*3/uL (ref 4.0–10.5)
nRBC: 0 % (ref 0.0–0.2)

## 2020-12-29 IMAGING — DX DG CHEST 1V PORT
1 series · 1 of 1 positions shown · non-contrast
Comparison: [DATE]

CLINICAL DATA: Post CABG.  Pleural effusion.

EXAM:
PORTABLE CHEST 1 VIEW

[chest ap]
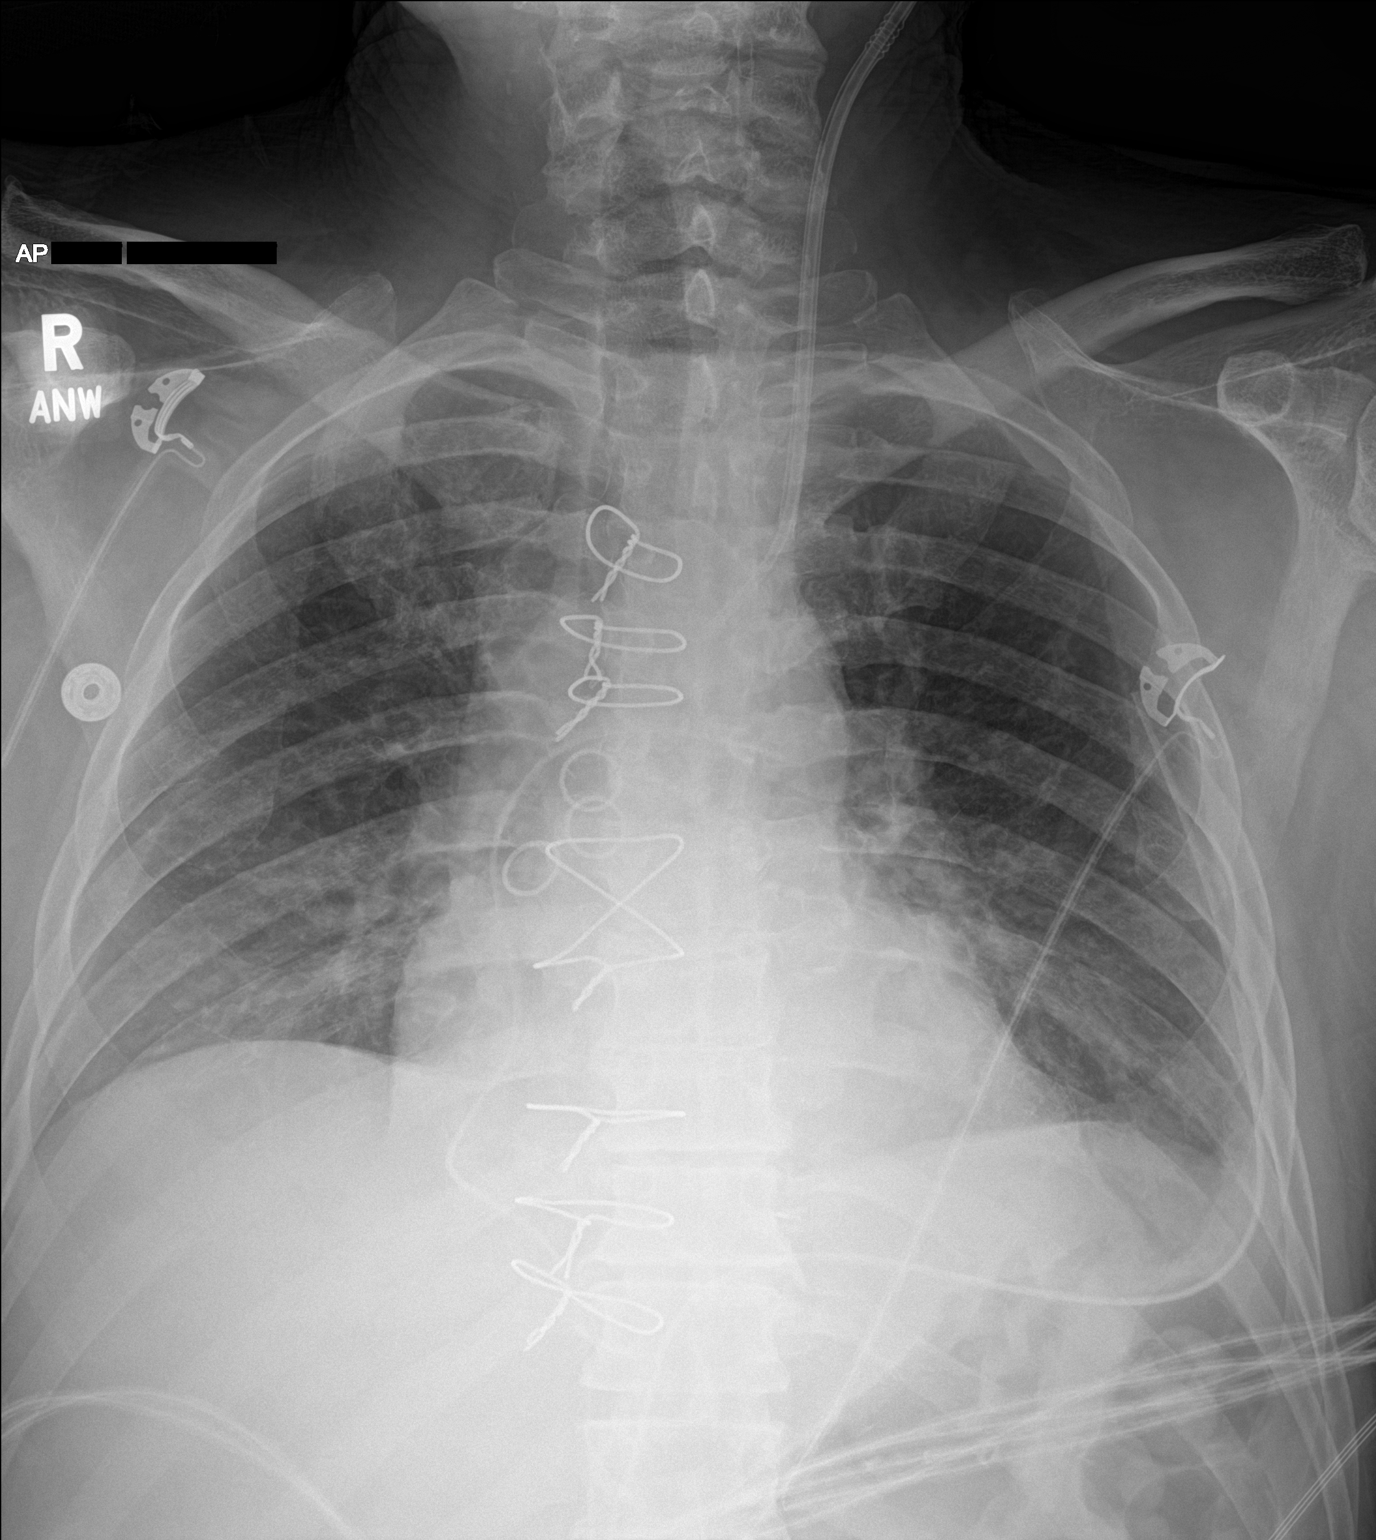

[1 of 1 positions shown; findings below may reference images not displayed]

FINDINGS: Sternotomy wires are intact. The left central line is stable. Mild
cardiomegaly is stable. The hila and mediastinum are unchanged. No
pneumothorax. No nodules or masses. Mild haziness in the bases,
likely atelectasis. The left chest tube is stable. The mediastinal
drain is stable.
IMPRESSION: 1. Stable support apparatus.
2. Mild haziness in the bases could represent atelectasis. Recommend
attention on follow-up.

## 2020-12-29 MED ORDER — SODIUM CHLORIDE 0.9% FLUSH: 3.0000 mL | INTRAVENOUS | Status: DC | PRN

## 2020-12-29 MED ORDER — SODIUM CHLORIDE 0.9 % IV SOLN: 250.0000 mL | INTRAVENOUS | Status: DC | PRN

## 2020-12-29 MED ORDER — SODIUM CHLORIDE 0.9% FLUSH
3.0000 mL | Freq: Two times a day (BID) | INTRAVENOUS | Status: DC
  Administered 2020-12-31: 3 mL via INTRAVENOUS

## 2020-12-29 MED ORDER — ~~LOC~~ CARDIAC SURGERY, PATIENT & FAMILY EDUCATION: Freq: Once | Status: AC

## 2020-12-29 MED ORDER — TOPIRAMATE 25 MG PO TABS
50.0000 mg | ORAL_TABLET | Freq: Two times a day (BID) | ORAL | Status: DC
  Administered 2020-12-29 – 2020-12-30 (×4): 50 mg via ORAL
  Filled 2020-12-29 (×5): qty 2

## 2020-12-29 NOTE — Hospital Course (Addendum)
History of Present Illness:    at time of surgical consultation [name] is a 55 year old male with a past history notable for regular daily tobacco use, type 2 diabetes mellitus, dyslipidemia,  hypertension and family history of premature coronary artery disease.Marland Kitchen  He presented to the emergency room in April of this year with a primary complaint of dizziness and during the course of work-up for this complaint, he had an EKG that inferior T wave inversions.  High-sensitivity troponin at that same encounter was reported as normal but a referral was made to cardiology for further evaluation.  He was seen by Dr. Laurance Flatten on 12/10/2020 and at that time described having some intermittent chest pain with associated tightness in his chest and bilateral upper extremities.  Left heart catheterization was recommended for further evaluation and was carried out electively on 12/24/2020.  Coronary angiography demonstrated severe three-vessel coronary artery disease with a long proximal 70% LAD stenosis, proximal 70% first diagonal stenosis, 70% proximal circumflex and OM stenoses and total occlusion of the right coronary artery with left-to-right collaterals.  An echocardiogram obtained on the same day showed left ventricular ejection fraction 55 to 60%, normal RV function, and no evidence of structural or functional valvular disease.  [name] was admitted to the hospital and CT surgery consultation was requested for consideration of operative coronary revascularization. [name] is currently sitting up in bed preparing to eat lunch.  He denies having any chest pain or dizziness currently but had a headache earlier today which is common for him.  He is right-hand dominant.  He is unemployed.  He reports he tried to "get on disability" but was denied and gets SSI funds each month instead.  He is married and lives with his wife.  He was discouraged that it took him 3 months to get an appointment with  the cardiology service for evaluation of his symptoms. The patient and all relevant studies were reviewed by Dr. Cliffton Asters who recommended proceeding with CABG.    Hospital Course:  The patient was medically stabilized and on 12/27/2020 he was taken the operating room at which time he underwent the below described procedure.    12/27/2020 Patient:  [name] Pre-Op Dx: Three-vessel coronary artery disease                         NSTEMI                         Diabetes                         Hypertension Post-op Dx: Same Procedure: CABG X 4.  LIMA to LAD, reverse saphenous vein graft to PDA, OM 3/posterior lateral branch, diagonal. Endoscopic greater saphenous vein harvest on the right     Surgeon and Role:      * Lightfoot, Eliezer Lofts, MD - Primary    *D. Janeann Forehand- assisting Assistant: Webb Laws, PA-C    He tolerated it well and was taken to the surgical intensive care unit in stable condition.   Postoperative hospital course:   The patient is overall progressed well.  He was extubated overnight using standard protocols without difficulty.  He initially did require some inotropic support but this was able to be weaned without difficulty.  He was started on midodrine to assist with blood pressure management early on.  His early laboratory  values showed an expected acute blood loss anemia which is being followed clinically.  He has not required transfusion.  Renal function has remained within normal limits.  As his blood pressure has been low diuresis has been held early.  Oral midodrine was added and his blood pressure improved. Diuresis was initiated with oral Lasix. He was transferred to 4E progressive care on post-op day 3. On the following morning, he was noted to be drowsy and lethargic although he had no focal neuro deficits. He did begin ambulation with the cardiac rehab team. The dosing of the post-op analgesics were re-evaluated and reduced.  His Topamax was also  reduced. Blood sugars have been under adequate control using standard protocols. He was taken off diuretics and given some fluid since he had some orthostatic hypotension. We continued midodrine. He remained on SQ enoxaparin. We continued to work on PO intake and ambulation in the halls. He continued to slowly progress.

## 2020-12-29 NOTE — Discharge Summary (Signed)
[address]       [place] [id number]             [phone]    Physician Discharge Summary  Patient ID: Benjamin SilenceBobby Sisk MRN: [id number] DOB/AGE: 56-Jan-1967 56 y.o.  Admit date: 12/24/2020 Discharge date: 01/05/2021  Admission Diagnoses:  Patient Active Problem List   Diagnosis Date Noted   S/P CABG x 4 12/27/2020   Diabetes mellitus, type II, insulin dependent (HCC) 12/25/2020   Hyperlipidemia associated with type 2 diabetes mellitus (HCC) 12/25/2020   Essential hypertension 12/25/2020   Tobacco use 12/25/2020   Unstable angina (HCC) 12/24/2020   Coronary artery disease involving native coronary artery of native heart with unstable angina pectoris [facility](HCC)      Discharge Diagnoses:  Patient Active Problem List   Diagnosis Date Noted   S/P CABG x 4 12/27/2020   Diabetes mellitus, type II, insulin dependent (HCC) 12/25/2020   Hyperlipidemia associated with type 2 diabetes mellitus (HCC) 12/25/2020   Essential hypertension 12/25/2020   Tobacco use 12/25/2020   Unstable angina (HCC) 12/24/2020   Coronary artery disease involving native coronary artery of native heart with unstable angina pectoris [facility](HCC)      Discharged Condition: good     History of Present Illness:    at time of surgical consultation [name] is a 55 year old male with a past history notable for regular daily tobacco use, type 2 diabetes mellitus, dyslipidemia,  hypertension and family history of premature coronary artery disease.Marland Kitchen.  He presented to the emergency room in April of this year with a primary complaint of dizziness and during the course of work-up for this complaint, he had an EKG that inferior T wave inversions.  High-sensitivity troponin at that same encounter was reported as normal but a referral was made to cardiology for further evaluation.  He was seen by Dr. Laurance FlattenHeather Pemberton on 12/10/2020 and at that time described having some intermittent chest pain with  associated tightness in his chest and bilateral upper extremities.  Left heart catheterization was recommended for further evaluation and was carried out electively on 12/24/2020.  Coronary angiography demonstrated severe three-vessel coronary artery disease with a long proximal 70% LAD stenosis, proximal 70% first diagonal stenosis, 70% proximal circumflex and OM stenoses and total occlusion of the right coronary artery with left-to-right collaterals.  An echocardiogram obtained on the same day showed left ventricular ejection fraction 55 to 60%, normal RV function, and no evidence of structural or functional valvular disease.  [name] was admitted to the hospital and CT surgery consultation was requested for consideration of operative coronary revascularization. [name] is currently sitting up in bed preparing to eat lunch.  He denies having any chest pain or dizziness currently but had a headache earlier today which is common for him.  He is right-hand dominant.  He is unemployed.  He reports he tried to "get on disability" but was denied and gets SSI funds each month instead.  He is married and lives with his wife.  He was discouraged that it took him 3 months to get an appointment with the cardiology service for evaluation of his symptoms. The patient and all relevant studies were reviewed by Dr. Cliffton AstersLightfoot who recommended proceeding with CABG.    Hospital course:  The patient was medically stabilized and on 12/27/2020 he was taken the operating room at which time he underwent the below described procedure.    12/27/2020 Patient:  Benjamin SilenceBobby Veneziano Pre-Op Dx:  Three-vessel coronary artery disease                         NSTEMI                         Diabetes                         Hypertension Post-op Dx: Same Procedure: CABG X 4.  LIMA to LAD, reverse saphenous vein graft to PDA, OM 3/posterior lateral branch, diagonal. Endoscopic greater saphenous vein harvest on the right     Surgeon  and Role:      * Lightfoot, Eliezer Lofts, MD - Primary    *D. Janeann Forehand- assisting Assistant: Webb Laws, PA-C    He tolerated it well and was taken to the surgical intensive care unit in stable condition.  Postoperative Hospital Course:   The patient is overall progressed well.  He was extubated overnight using standard protocols without difficulty.  He initially did require some inotropic support but this was able to be weaned without difficulty.  He was started on midodrine to assist with blood pressure management early on.  His early laboratory values showed an expected acute blood loss anemia which is being followed clinically.  He has not required transfusion.  Renal function has remained within normal limits.  As his blood pressure has been low diuresis has been held early.  Oral midodrine was added and his blood pressure improved. Diuresis was initiated with oral Lasix. He was transferred to 4E progressive care on post-op day 3. On the following morning, he was noted to be drowsy and lethargic although he had no focal neuro deficits. He did begin ambulation with the cardiac rehab team. The dosing of the post-op analgesics were re-evaluated and reduced.  His Topamax was also reduced. Blood sugars have been under adequate control using standard protocols.  He progressed slowly with ambulation due to orthostatic hypotension likely related to poor oral intake. He was given fluid boluses on 2 occasions after transfer to 4E which improved his BP. He was also maintained on oral midodrine. Today, he is ambulating with limited issues and maintaining BP. He is stable for discharge.          Consults: None  Significant Diagnostic Studies:  DG Chest 2 View  Result Date: 01/01/2021 CLINICAL DATA:  Chest soreness, recent cardiac surgery EXAM: CHEST - 2 VIEW COMPARISON:  12/29/2020 chest radiograph. FINDINGS: Intact sternotomy wires. Stable cardiomediastinal silhouette with normal heart size. No  pneumothorax. Trace bilateral pleural effusions. No pulmonary edema. No acute consolidative airspace disease. IMPRESSION: Trace bilateral pleural effusions.  Otherwise no active disease. Electronically Signed   By: Delbert Phenix M.D.   On: 01/01/2021 07:04   DG Chest 2 View  Result Date: 12/26/2020 CLINICAL DATA:  Preoperative evaluation EXAM: CHEST - 2 VIEW COMPARISON:  09/20/2020 FINDINGS: The heart size and mediastinal contours are within normal limits. Both lungs are clear. The visualized skeletal structures are unremarkable. IMPRESSION: No active cardiopulmonary disease. Electronically Signed   By: Alcide Clever M.D.   On: 12/26/2020 20:53   CARDIAC CATHETERIZATION  Result Date: 12/24/2020  Prox RCA lesion is 100% stenosed.  Ost Cx to Prox Cx lesion is 70% stenosed.  Dist Cx lesion is 99% stenosed.  1st Mrg lesion is 70% stenosed.  Prox LAD to Mid LAD lesion is 70% stenosed.  1st Diag lesion is  70% stenosed.  Mid LAD lesion is 50% stenosed.  1. Severe three vessel CAD 2. Severe proximal to mid LAD stenosis. Severe stenosis in the moderate caliber first diagonal branch. 3. Severe stenosis proximal Circumflex and distal AV groove Circumflex. Severe stenosis first obtuse marginal branch. 4. Chronic occlusion of the dominant RCA. The PDA fills from left to right collaterals. Recommendations: He has been having daily angina. Will admit to telemetry. Echo to assess LV function. CT surgery consult for CABG.   DG Chest Port 1 View  Result Date: 12/29/2020 CLINICAL DATA:  Post CABG.  Pleural effusion. EXAM: PORTABLE CHEST 1 VIEW COMPARISON:  December 28, 2020 FINDINGS: Sternotomy wires are intact. The left central line is stable. Mild cardiomegaly is stable. The hila and mediastinum are unchanged. No pneumothorax. No nodules or masses. Mild haziness in the bases, likely atelectasis. The left chest tube is stable. The mediastinal drain is stable. IMPRESSION: 1. Stable support apparatus. 2. Mild haziness in  the bases could represent atelectasis. Recommend attention on follow-up. Electronically Signed   By: Gerome Sam III M.D   On: 12/29/2020 10:44   DG Chest Port 1 View  Result Date: 12/28/2020 CLINICAL DATA:  Post CABG EXAM: PORTABLE CHEST 1 VIEW COMPARISON:  12/27/2020 FINDINGS: Endotracheal and enteric tubes are no longer present. Left chest tube and probable mediastinal drain again noted. Left IJ central line within left innominate as before. No pneumothorax. Lungs are clear. Stable cardiomediastinal contours. Post CABG. IMPRESSION: Lines and tubes as above.  No pneumothorax.  Clear lungs. Electronically Signed   By: Guadlupe Spanish M.D.   On: 12/28/2020 08:36   DG Chest Port 1 View  Result Date: 12/27/2020 CLINICAL DATA:  Postop CABG EXAM: PORTABLE CHEST 1 VIEW COMPARISON:  12/26/2020 FINDINGS: Postop CABG. Endotracheal tube in good position. Left jugular central venous catheter tip in the left innominate vein. NG tube in the stomach. Catheter overlying the mediastinum. Left chest tube in place. No pneumothorax. Lungs are well aerated without infiltrate or edema. No significant pleural effusion. IMPRESSION: No acute findings.  Postop CABG. Electronically Signed   By: Marlan Palau M.D.   On: 12/27/2020 15:04   ECHOCARDIOGRAM COMPLETE  Result Date: 12/24/2020    ECHOCARDIOGRAM REPORT   Patient Name:   TRIPTON NED Date of Exam: 12/24/2020 Medical Rec #:  [id number]      Height:       67.0 in Accession #:    [id number]     Weight:       132.0 lb Date of Birth:  [birth year]     BSA:          1.695 m Patient Age:    54 years       BP:           96/48 mmHg Patient Gender: M              HR:           85 bpm. Exam Location:  Inpatient Procedure: 2D Echo, 3D Echo, Cardiac Doppler and Color Doppler Indications:    I25.110 Atherosclerotic heart disease of native coronary artery                 with unstable angina pectoris  History:        Patient has no prior history of Echocardiogram examinations.                  CAD; Signs/Symptoms:Chest Pain.  Sonographer:    Sheralyn Boatman RDCS  Referring Phys: [id number] [facility] Z ATKINS  Sonographer Comments: Technically difficult study due to poor echo windows and suboptimal parasternal window. Patient supine post cath procedure. IMPRESSIONS  1. Left ventricular ejection fraction, by estimation, is 55 to 60%. The left ventricle has normal function. The left ventricle has no regional wall motion abnormalities. Left ventricular diastolic parameters are consistent with Grade I diastolic dysfunction (impaired relaxation).  2. Right ventricular systolic function is normal. The right ventricular size is normal.  3. The mitral valve is normal in structure. No evidence of mitral valve regurgitation. No evidence of mitral stenosis.  4. The aortic valve was not well visualized. Aortic valve regurgitation is not visualized. No aortic stenosis is present.  5. The inferior vena cava is normal in size with greater than 50% respiratory variability, suggesting right atrial pressure of 3 mmHg. Comparison(s): No prior Echocardiogram. FINDINGS  Left Ventricle: Left ventricular ejection fraction, by estimation, is 55 to 60%. The left ventricle has normal function. The left ventricle has no regional wall motion abnormalities. 3D left ventricular ejection fraction analysis performed but not reported based on interpreter judgement due to suboptimal quality. The left ventricular internal cavity size was normal in size. There is no left ventricular hypertrophy. Left ventricular diastolic parameters are consistent with Grade I diastolic dysfunction (impaired relaxation). Right Ventricle: The right ventricular size is normal. No increase in right ventricular wall thickness. Right ventricular systolic function is normal. Left Atrium: Left atrial size was normal in size. Right Atrium: Right atrial size was normal in size. Pericardium: There is no evidence of pericardial effusion. Mitral Valve: The mitral valve is  normal in structure. There is mild thickening of the mitral valve leaflet(s). There is mild calcification of the mitral valve leaflet(s). Mild mitral annular calcification. No evidence of mitral valve regurgitation. No evidence of mitral valve stenosis. Tricuspid Valve: The tricuspid valve is normal in structure. Tricuspid valve regurgitation is trivial. Aortic Valve: The aortic valve was not well visualized. Aortic valve regurgitation is not visualized. No aortic stenosis is present. Pulmonic Valve: The pulmonic valve was not well visualized. Pulmonic valve regurgitation is trivial. Aorta: The aortic root was not well visualized. Venous: The inferior vena cava is normal in size with greater than 50% respiratory variability, suggesting right atrial pressure of 3 mmHg. IAS/Shunts: No atrial level shunt detected by color flow Doppler.  LEFT VENTRICLE PLAX 2D LVIDd:         3.70 cm     Diastology LVIDs:         2.50 cm     LV e' medial:    8.59 cm/s LV PW:         0.80 cm     LV E/e' medial:  9.0 LV IVS:        1.10 cm     LV e' lateral:   13.60 cm/s LVOT diam:     1.70 cm     LV E/e' lateral: 5.7 LV SV:         37 LV SV Index:   22 LVOT Area:     2.27 cm                             3D Volume EF: LV Volumes (MOD)           3D EF:        59 % LV vol d, MOD A2C: 43.3 ml LV EDV:  86 ml LV vol d, MOD A4C: 61.0 ml LV ESV:       35 ml LV vol s, MOD A2C: 17.1 ml LV SV:        50 ml LV vol s, MOD A4C: 25.1 ml LV SV MOD A2C:     26.2 ml LV SV MOD A4C:     61.0 ml LV SV MOD BP:      31.0 ml RIGHT VENTRICLE             IVC RV S prime:     12.40 cm/s  IVC diam: 1.40 cm TAPSE (M-mode): 1.8 cm LEFT ATRIUM             Index      RIGHT ATRIUM          Index LA diam:        2.20 cm 1.30 cm/m RA Area:     4.42 cm LA Vol (A2C):   12.8 ml 7.55 ml/m RA Volume:   4.95 ml  2.92 ml/m LA Vol (A4C):   12.7 ml 7.49 ml/m LA Biplane Vol: 12.8 ml 7.55 ml/m  AORTIC VALVE LVOT Vmax:   104.00 cm/s LVOT Vmean:  65.500 cm/s LVOT VTI:     0.165 m  AORTA Ao Root diam: 2.50 cm Ao Desc diam: 2.50 cm MITRAL VALVE MV Area (PHT): 3.99 cm    SHUNTS MV Decel Time: 190 msec    Systemic VTI:  0.16 m MV E velocity: 77.00 cm/s  Systemic Diam: 1.70 cm MV A velocity: 77.60 cm/s MV E/A ratio:  0.99 Laurance Flatten MD Electronically signed by Laurance Flatten MD Signature Date/Time: 12/24/2020/4:29:26 PM    Final    ECHO INTRAOPERATIVE TEE  Result Date: 12/27/2020  *INTRAOPERATIVE TRANSESOPHAGEAL REPORT *  Patient Name:   [name] Date of Exam: 12/27/2020 Medical Rec #:  [id number]      Height:       67.0 in Accession #:    [id number]     Weight:       129.7 lb Date of Birth:  1966-05-29     BSA:          1.68 m Patient Age:    54 years       BP:           133/81 mmHg Patient Gender: M              HR:           89 bpm. Exam Location:  Anesthesiology Transesophogeal exam was perform intraoperatively during surgical procedure. Patient was closely monitored under general anesthesia during the entirety of examination. Indications:     CAD Sonographer:     Lavenia Atlas RDCS Performing Phys: [id number] Eliezer Lofts LIGHTFOOT Diagnosing Phys: Heather Roberts MD Complications: No known complications during this procedure. POST-OP IMPRESSIONS Overall, there were no significant changes from pre-bypass. - Comments: LV Hyperdynamic after separation from bypass. PRE-OP FINDINGS  Left Ventricle: The left ventricle has normal systolic function, with an ejection fraction of 55-60%. The cavity size was normal. There is no left ventricular hypertrophy. Right Ventricle: The right ventricle has normal systolic function. The cavity was normal. There is no increase in right ventricular wall thickness. Left Atrium: Left atrial size was normal in size. No left atrial/left atrial appendage thrombus was detected. Right Atrium: Right atrial size was normal in size. Interatrial Septum: No atrial level shunt detected by color flow Doppler. Pericardium: There is no evidence of  pericardial  effusion. Mitral Valve: The mitral valve is normal in structure. Mitral valve regurgitation is trivial by color flow Doppler. There is No evidence of mitral stenosis. Tricuspid Valve: The tricuspid valve was normal in structure. Tricuspid valve regurgitation is trivial by color flow Doppler. No evidence of tricuspid stenosis is present. Aortic Valve: The aortic valve is tricuspid Aortic valve regurgitation was not visualized by color flow Doppler. There is no stenosis of the aortic valve. There is no evidence of aortic valve vegetation. Pulmonic Valve: The pulmonic valve was normal in structure. Pulmonic valve regurgitation is trivial by color flow Doppler. Aorta: The ascending aorta is normal in size and structure. The aortic arch was not well visualized. There is evidence of plaque in the descending aorta; Grade III, measuring 3-21mm in size.  +------------+--------++ 3D Volume EF         +------------+--------++ LV 3D EDV:  90.81 ml +------------+--------++ LV 3D ESV:  41.70 ml +------------+--------++ +-------------+------------++ AORTIC VALVE              +-------------+------------++ AV Vmax:     143.00 cm/s  +-------------+------------++ AV Vmean:    103.000 cm/s +-------------+------------++ AV VTI:      0.290 m      +-------------+------------++ AV Peak Grad:8.2 mmHg     +-------------+------------++ AV Mean Grad:5.0 mmHg     +-------------+------------++  Heather Roberts MD Electronically signed by Heather Roberts MD Signature Date/Time: 12/27/2020/2:39:41 PM    Final    VAS US DOPPLER PRE CABG  Result Date: 12/25/2020 PREOPERATIVE VASCULAR EVALUATION Patient Name:  KENETH BORG  Date of Exam:   12/24/2020 Medical Rec #: [id number]       Accession #:    [id number] Date of Birth: 17-Dec-1965      Patient Gender: M Patient Age:   55Y Exam [facility]  [facility] Procedure:      VAS US DOPPLER PRE CABG Referring Phys: [id number] BROADUS Z ATKINS  --------------------------------------------------------------------------------  Indications:      Pre-CABG. Risk Factors:     Diabetes, current smoker, coronary artery disease. Other Factors:    Patein had CTA of neck indicating 50% right ICA stenosis and                   <50% right ICA stenosis, 09/20/2020. Comparison Study: No prior studies. Performing Technologist: Sherren Kerns RVS  Examination Guidelines: A complete evaluation includes B-mode imaging, spectral Doppler, color Doppler, and power Doppler as needed of all accessible portions of each vessel. Bilateral testing is considered an integral part of a complete examination. Limited examinations for reoccurring indications may be performed as noted.  ABI Findings: +---------+------------------+-----+-------------------+--------+ Right    Rt Pressure (mmHg)IndexWaveform           Comment  +---------+------------------+-----+-------------------+--------+ Brachial 129                    multiphasic                 +---------+------------------+-----+-------------------+--------+ PTA                             absent                      +---------+------------------+-----+-------------------+--------+ DP       67                0.52 dampened monophasic         +---------+------------------+-----+-------------------+--------+ Cecilio Asper  0.23                             +---------+------------------+-----+-------------------+--------+ +---------+------------------+-----+-------------------+------------------+ Left     Lt Pressure (mmHg)IndexWaveform           Comment            +---------+------------------+-----+-------------------+------------------+ Brachial                                           IV, bandages, tape +---------+------------------+-----+-------------------+------------------+ PTA                             absent                                 +---------+------------------+-----+-------------------+------------------+ DP       79                0.61 dampened monophasic                   +---------+------------------+-----+-------------------+------------------+ Great Toe62                0.48                                       +---------+------------------+-----+-------------------+------------------+ +-------+---------------+----------------+ ABI/TBIToday's ABI/TBIPrevious ABI/TBI +-------+---------------+----------------+ Right  0.52                            +-------+---------------+----------------+ Left   0.61                            +-------+---------------+----------------+  Right Doppler Findings: +--------+--------+-----+-----------+--------+ Site    PressureIndexDoppler    Comments +--------+--------+-----+-----------+--------+ [id number]          multiphasic         +--------+--------+-----+-----------+--------+ Radial               multiphasic         +--------+--------+-----+-----------+--------+ Ulnar                multiphasic         +--------+--------+-----+-----------+--------+  Left Doppler Findings: +--------+--------+-----+-----------+------------------+ Site    PressureIndexDoppler    Comments           +--------+--------+-----+-----------+------------------+ Brachial                        IV, bandages, tape +--------+--------+-----+-----------+------------------+ Radial               multiphasic                   +--------+--------+-----+-----------+------------------+ Ulnar                multiphasic                   +--------+--------+-----+-----------+------------------+  Right ABI: Resting right ankle-brachial index indicates moderate right lower extremity arterial disease. The right toe-brachial index is abnormal. Left ABI: Resting left ankle-brachial index indicates moderate left lower extremity arterial disease. The left toe-brachial index is  abnormal. Right Upper Extremity: Doppler waveforms remain within normal  limits with right radial compression. Doppler waveforms remain within normal limits with right ulnar compression. Left Upper Extremity: Doppler waveforms remain within normal limits with left radial compression. Doppler waveform obliterate with left ulnar compression.  Electronically signed by Fabienne Bruns MD on 12/25/2020 at 5:11:07 PM.    Final      Treatments:      [address]       [name] [address]             [phone]                                           12/27/2020 Patient:  [name] Pre-Op Dx: Three-vessel coronary artery disease                         NSTEMI                         Diabetes                         Hypertension Post-op Dx: Same Procedure: CABG X 4.  LIMA to LAD, reverse saphenous vein graft to PDA, OM 3/posterior lateral branch, diagonal. Endoscopic greater saphenous vein harvest on the right     Surgeon and Role:      * Lightfoot, Eliezer Lofts, MD - Primary    *D. Joycelyn Man, PA-C- assisting Assistant: Webb Laws, PA-C Anesthesia  general EBL: 500 ml Blood Administration: None Xclamp Time: 18 min Pump Time: 166 min   Drains: 19 F blake drain: L, mediastinal Wires: None Counts: correct     Indications: 55 year old male with three-vessel coronary disease.  He presents with worsening chest exertional dyspnea and dizziness.  Left heart cath showed severe three-vessel coronary disease.  I will plan for CABG with left radial artery harvest on 12/27/2020.  He is agreeable to proceed. Findings: We originally planned to harvest the left radial artery but upon Doppler there was not a good palmar arch signal with clamping of the radial artery.  We then proceeded to take 3 pieces of vein.   The patient received a total of 2 L of KB C cardioplegia solution but we were unable to achieve complete arrest of the heart.  After the first liter the cross-clamp was  repositioned and another 500 mL was given with similar result.  The cross-clamp was position once more and another full dose was given and we still were not able to achieve complete arrest.  For this reason we elected to perform the procedure with the heart warm and beating.   The LIMA and vein was good conduit.  The LAD was small and heavily calcified.  The PDA was also small and calcified.  The OM 3/posterior lateral branch was a small target.  It was covered in scar.  The diagonal was also heavily calcified.   The patient was vasa plegic at the start of the case.  We were able to separate from cardiopulmonary bypass on the background dose of vasopressin with some Levophed.  He was very volume responsive.   Discharge Exam: Blood pressure (!) 88/54, pulse 86, temperature 98.1 F (36.7 C), temperature source Oral, resp. rate (!) 23, height 5\' 7"  (1.702 m), weight 58 kg, SpO2 100 %.  Cor: RRR, no murmur Pulm:  CTA bilaterally Abd: soft, nontender Ext: no edema Incisions: healing well, clean and dry  Discharge Medications:  The patient has been discharged on:   1.Beta Blocker:  Yes [  x ]                              No   [   ]                              If No, reason:  2.Ace Inhibitor/ARB: Yes [   ]                                     No  [   x ]                                     If No, reason: on midodrine  3.Statin:   Yes [ x  ]                  No  [   ]                  If No, reason:  4.Ecasa:  Yes  [  x ]                  No   [   ]                  If No, reason:    Discharge Instructions     AMB Referral to Community Care Coordinaton   Complete by: As directed    Managed Medicaid: [employer], patient in [place]  Please assign to [facility] RN Care Coordinator for complex care and disease management CABG x4 follow up calls and assess for further needs.  Questions please call:   Charlesetta Shanks, RN BSN CCM [facility]   [phone] business mobile phone Toll free office [phone]  Fax number: [phone] [email or web address]   Reason for Referral: Care Coordination (Managed Medicaid)   MM Services needed: Nurse Case Manager   Diagnoses of:  Other High-risk Medicaid Diabetes     Other Diagnosis: s/p CABG follow up with [facility]; smoking cessation follow up, if eligible   Expected date of contact: Urgent - 7 Days   Amb Referral to Cardiac Rehabilitation   Complete by: As directed    Referred to cardiac rehab program in [place], [place].   Diagnosis: CABG   CABG X ___: 4   After initial evaluation and assessments completed: Virtual Based Care may be provided alone or in conjunction with Phase 2 Cardiac Rehab based on patient barriers.: Yes      Allergies as of 01/05/2021       Reactions   Atorvastatin Other (See Comments)   Myalgias         Medication List     STOP taking these medications    carvedilol 3.125 MG tablet Commonly known as: COREG   gabapentin 300 MG capsule Commonly known as: NEURONTIN   lisinopril 10 MG tablet Commonly known as: ZESTRIL       TAKE these medications    aspirin 81 MG chewable tablet Chew 1 tablet (81 mg total)  by mouth daily. What changed: when to take this   Basaglar KwikPen 100 UNIT/ML Inject 10 Units into the skin in the morning.   busPIRone 10 MG tablet Commonly known as: BUSPAR Take 10 mg by mouth 2 (two) times daily.   Cymbalta 60 MG capsule Generic drug: DULoxetine Take 60 mg by mouth in the morning.   diphenhydrAMINE 25 MG tablet Commonly known as: BENADRYL Take 25 mg by mouth in the morning.   glipiZIDE 10 MG tablet Commonly known as: GLUCOTROL Take 10 mg by mouth 2 (two) times daily.   metFORMIN 1000 MG tablet Commonly known as: GLUCOPHAGE Take 500 mg by mouth 2 (two) times daily.   metoprolol tartrate 25 MG tablet Commonly known as: LOPRESSOR Take 0.5 tablets (12.5 mg total) by  mouth 2 (two) times daily.   midodrine 10 MG tablet Commonly known as: PROAMATINE Take 1 tablet (10 mg total) by mouth 3 (three) times daily with meals.   omeprazole 40 MG capsule Commonly known as: PRILOSEC Take 40 mg by mouth in the morning.   rOPINIRole 0.25 MG tablet Commonly known as: REQUIP Take 0.25 mg by mouth at bedtime.   rosuvastatin 20 MG tablet Commonly known as: CRESTOR Take 1 tablet (20 mg total) by mouth daily. What changed: when to take this   topiramate 50 MG tablet Commonly known as: Topamax Take 1 tablet (50 mg total) by mouth 2 (two) times daily.   traMADol 50 MG tablet Commonly known as: ULTRAM Take 1 tablet (50 mg total) by mouth every 6 (six) hours as needed for moderate pain.   Vitamin D (Ergocalciferol) 1.25 MG (50000 UNIT) Caps capsule Commonly known as: DRISDOL Take 50,000 Units by mouth every Friday.               Durable Medical Equipment  (From admission, onward)           Start     Ordered   01/04/21 0738  For home use only DME Bedside commode  Once       Question:  Patient needs a bedside commode to treat with the following condition  Answer:  Debility   01/04/21 0738   01/03/21 1553  For home use only DME Shower stool  Once        01/03/21 1552   01/03/21 1553  For home use only DME Walker rolling  Once       Question Answer Comment  Walker: With 5 Inch Wheels   Patient needs a walker to treat with the following condition Weakness      01/03/21 1552   01/02/21 1609  For home use only DME 3 n 1  Once        01/02/21 1612   01/02/21 1608  For home use only DME Walker rolling  Once       Question Answer Comment  Walker: With 5 Inch Wheels   Patient needs a walker to treat with the following condition Imbalance      01/02/21 1612            Follow-up Information     [facility] Follow up on 01/03/2021.   Specialty: Podiatry Why: Your appointment is at 9:00am. Contact information: [address] [place] [address] [phone]        Corliss Skains, MD. Call on 01/11/2021.   Specialty: Cardiothoracic Surgery Why: This is a VIRTUAL VISIT at 3pm. Do not go  to the office on this date. Contact information: [address] [place] [address] [phone]         [facility]. Go on 01/17/2021.   Specialty: Cardiology Why: Your appointment with Gillian Shields is at 2pm. Contact information: [address] [place] [address] [phone]        Corliss Skains, MD. Go on 02/15/2021.   Specialty: Cardiothoracic Surgery Why: Your appointment with Dr. Cliffton Asters is at 10am.  Please arrive 30 minutes early for a  Chest X-ray to be performed by [facility] located on te first floor of the same building. Contact information: [address] [place] [address] [phone]         Therapy, [facility] Follow up.   Specialty: Physical Therapy Why: please call to schedule apt time. Contact information: [address] [place] [address] [phone]                 Signed: @ 01/05/2021, 12:02 PM

## 2020-12-29 NOTE — Progress Notes (Signed)
      [address]       [place] [address]             [phone]                 2 Days Post-Op Procedure(s) (LRB): CORONARY ARTERY BYPASS GRAFTING (CABG) X FOUR ON PUMP USING LEFT INTERNAL MAMMARY ARTERY AND RIGHT ENDOSCOPIC GREATER SAPHENOUS VEIN CONDUITS (N/A) TRANSESOPHAGEAL ECHOCARDIOGRAM (TEE) (N/A) APPLICATION OF CELL SAVER   Events: No events Confusion improved Complains of headache _______________________________________________________________ Vitals: BP (!) 95/52   Pulse 89   Temp 98 F (36.7 C)   Resp 12   Ht 5\' 7"  (1.702 m)   Wt 64.7 kg   SpO2 100%   BMI 22.34 kg/m   - Neuro: arousable, NAD  - Cardiovascular: sinus  Drips: none.      - Pulm: EWOB    ABG    Component Value Date/Time   PHART 7.485 (H) 12/28/2020 0433   PCO2ART 22.3 (L) 12/28/2020 0433   PO2ART 92 12/28/2020 0433   HCO3 16.8 (L) 12/28/2020 0433   TCO2 17 (L) 12/28/2020 0433   ACIDBASEDEF 6.0 (H) 12/28/2020 0433   O2SAT 98.0 12/28/2020 0433    - Abd: ND - Extremity: warm  .Intake/Output      07/15 0701 07/16 0700 07/16 0701 07/17 0700   P.O. 120    I.V. (mL/kg) 524.4 (8.1)    Blood     IV Piggyback 299.9    Total Intake(mL/kg) 944.4 (14.6)    Urine (mL/kg/hr) 1425 (0.9)    Blood     Chest Tube 150    Total Output 1575    Net -630.7         Urine Occurrence 1 x       _______________________________________________________________ Labs: CBC Latest Ref Rng & Units 12/29/2020 12/28/2020 12/28/2020  WBC 4.0 - 10.5 K/uL 9.6 7.4 -  Hemoglobin 13.0 - 17.0 g/dL 12/30/2020) 7.8(L) 7.1(L)  Hematocrit 39.0 - 52.0 % 24.7(L) 22.8(L) 21.0(L)  Platelets 150 - 400 K/uL 132(L) 101(L) -   CMP Latest Ref Rng & Units 12/29/2020 12/28/2020 12/28/2020  Glucose 70 - 99 mg/dL 12/30/2020) 595(G) -  BUN 6 - 20 mg/dL 19 18 -  Creatinine 387(F - 1.24 mg/dL 6.43 3.29 -  Sodium 5.18 - 145 mmol/L 137 137 144  Potassium 3.5 - 5.1 mmol/L 4.2 4.3 4.7  Chloride 98 - 111 mmol/L 110 110 -   CO2 22 - 32 mmol/L 22 22 -  Calcium 8.9 - 10.3 mg/dL 8.2(L) 8.1(L) -  Total Protein 6.5 - 8.1 g/dL - - -  Total Bilirubin 0.3 - 1.2 mg/dL - - -  Alkaline Phos 38 - 126 U/L - - -  AST 15 - 41 U/L - - -  ALT 0 - 44 U/L - - -    CXR: PV congestion  _______________________________________________________________  Assessment and Plan: POD 2 s/p CABG  Neuro: pain controlled.  Adding Topamax for migraines  CV: on a/s/bb.  Remains on midodrine for low BP Pulm: will remove CT.  Continue pulm toilet Renal: creat stable.  Will hold on diuresis fo now GI: on diet Heme: stable ID: afebrile Endo: SSI Dispo: floor today   841 12/29/2020 9:21 AM

## 2020-12-30 LAB — BPAM RBC
Blood Product Expiration Date: 202208092359
Blood Product Expiration Date: 202208092359
ISSUE DATE / TIME: 202207141120
ISSUE DATE / TIME: 202207141120
Unit Type and Rh: 6200
Unit Type and Rh: 6200

## 2020-12-30 LAB — GLUCOSE, CAPILLARY
Glucose-Capillary: 116 mg/dL — ABNORMAL HIGH (ref 70–99)
Glucose-Capillary: 107 mg/dL — ABNORMAL HIGH (ref 70–99)
Glucose-Capillary: 128 mg/dL — ABNORMAL HIGH (ref 70–99)
Glucose-Capillary: 155 mg/dL — ABNORMAL HIGH (ref 70–99)
Glucose-Capillary: 142 mg/dL — ABNORMAL HIGH (ref 70–99)
Glucose-Capillary: 117 mg/dL — ABNORMAL HIGH (ref 70–99)

## 2020-12-30 LAB — CBC
HCT: 26.5 % — ABNORMAL LOW (ref 39.0–52.0)
Hemoglobin: 9 g/dL — ABNORMAL LOW (ref 13.0–17.0)
MCH: 29.6 pg (ref 26.0–34.0)
MCHC: 34 g/dL (ref 30.0–36.0)
MCV: 87.2 fL (ref 80.0–100.0)
Platelets: 167 10*3/uL (ref 150–400)
RBC: 3.04 MIL/uL — ABNORMAL LOW (ref 4.22–5.81)
RDW: 13.3 % (ref 11.5–15.5)
WBC: 9.2 10*3/uL (ref 4.0–10.5)
nRBC: 0 % (ref 0.0–0.2)

## 2020-12-30 LAB — BASIC METABOLIC PANEL
Anion gap: 3 — ABNORMAL LOW (ref 5–15)
BUN: 22 mg/dL — ABNORMAL HIGH (ref 6–20)
CO2: 26 mmol/L (ref 22–32)
Calcium: 8.1 mg/dL — ABNORMAL LOW (ref 8.9–10.3)
Chloride: 108 mmol/L (ref 98–111)
Creatinine, Ser: 0.87 mg/dL (ref 0.61–1.24)
GFR, Estimated: >60 mL/min (ref >60)
Glucose, Bld: 133 mg/dL — ABNORMAL HIGH (ref 70–99)
Potassium: 3.7 mmol/L (ref 3.5–5.1)
Sodium: 137 mmol/L (ref 135–145)

## 2020-12-30 LAB — TYPE AND SCREEN
ABO/RH(D): A POS
Antibody Screen: NEGATIVE
Unit division: 0
Unit division: 0

## 2020-12-30 MED ORDER — SODIUM CHLORIDE 0.9 % IV SOLN: 250.0000 mL | INTRAVENOUS | Status: DC | PRN

## 2020-12-30 MED ORDER — SODIUM CHLORIDE 0.9% FLUSH: 3.0000 mL | INTRAVENOUS | Status: DC | PRN

## 2020-12-30 MED ORDER — ~~LOC~~ CARDIAC SURGERY, PATIENT & FAMILY EDUCATION: Freq: Once | Status: AC

## 2020-12-30 MED ORDER — ALBUMIN HUMAN 5 % IV SOLN: 25.0000 g | Freq: Once | INTRAVENOUS | Status: AC

## 2020-12-30 MED ORDER — SODIUM CHLORIDE 0.9% FLUSH: 3.0000 mL | Freq: Two times a day (BID) | INTRAVENOUS | Status: DC

## 2020-12-30 MED ORDER — ALBUMIN HUMAN 5 % IV SOLN
INTRAVENOUS | Status: AC
  Administered 2020-12-30: 25 g via INTRAVENOUS
  Filled 2020-12-30: qty 500

## 2020-12-30 MED ORDER — POTASSIUM CHLORIDE CRYS ER 20 MEQ PO TBCR
20.0000 meq | EXTENDED_RELEASE_TABLET | ORAL | Status: AC
Start: 2020-12-30 — End: 2020-12-30
  Administered 2020-12-30 (×3): 20 meq via ORAL
  Filled 2020-12-30 (×3): qty 1

## 2020-12-30 NOTE — Progress Notes (Signed)
Pt arrived to unit from 2 heart VSS, A/O x3  CCMD called ,CHG given, pt oriented to unit,Will continue to monitor. Pt has tele sitter at bedside   Everlean Cherry, RN    12/30/20 1341  Vitals  Temp 97.7 F (36.5 C)  Temp Source Oral  BP (!) 147/83  MAP (mmHg) 99  BP Location Left Arm  BP Method Automatic  Patient Position (if appropriate) Lying  Pulse Rate (!) 112  Pulse Rate Source Monitor  ECG Heart Rate (!) 109  Resp 20  Level of Consciousness  Level of Consciousness Alert  Oxygen Therapy  SpO2 97 %  O2 Device Room Air  O2 Flow Rate (L/min) 0 L/min  Pain Assessment  Pain Scale 0-10  Pain Score 5  MEWS Score  MEWS Temp 0  MEWS Systolic 0  MEWS Pulse 1  MEWS RR 0  MEWS LOC 0  MEWS Score 1  MEWS Score Color [place] Si

## 2020-12-30 NOTE — Consult Note (Signed)
WOC Nurse Consult Note: Reason for Consult: interdigital wound between 4th and 5th digits of right foot Wound type:moisture associated skin damage, intertriginous dermatitis Pressure Injury POA: N/A Drainage (amount, consistency, odor) small, serous Periwound: dry toes, macerated in at base Dressing procedure/placement/frequency: A strip of silver hydrofiber (Aquacel Ag+ Advantage) is recommended for wound care in this area, both to donate its antimicrobial property and wick away moisture from the tissues to support wound repair and tissue regeneration. I have provided Nursing with guidance for the specific care of this lesion via the Orders and will stand by if they report ineffective wear time or results not consistent with goals listed above.   WOC nursing team will not follow, but will remain available to this patient, the nursing and medical teams.  Please re-consult if needed. Thank you, Ladona Mow, MSN, RN, Perlie Gold, Hans Eden  Pager# [phone]

## 2020-12-30 NOTE — Progress Notes (Signed)
      [address]       [place] [address]             [phone]                 3 Days Post-Op Procedure(s) (LRB): CORONARY ARTERY BYPASS GRAFTING (CABG) X FOUR ON PUMP USING LEFT INTERNAL MAMMARY ARTERY AND RIGHT ENDOSCOPIC GREATER SAPHENOUS VEIN CONDUITS (N/A) TRANSESOPHAGEAL ECHOCARDIOGRAM (TEE) (N/A) APPLICATION OF CELL SAVER   Events: No events Confusion persists _______________________________________________________________ Vitals: BP 101/66   Pulse (!) 105   Temp (!) 97.5 F (36.4 C)   Resp 16   Ht 5\' 7"  (1.702 m)   Wt 64 kg   SpO2 100%   BMI 22.10 kg/m   - Neuro: alert NAD  - Cardiovascular: sinus tach  Drips: none.      - Pulm: EWOB    ABG    Component Value Date/Time   PHART 7.485 (H) 12/28/2020 0433   PCO2ART 22.3 (L) 12/28/2020 0433   PO2ART 92 12/28/2020 0433   HCO3 16.8 (L) 12/28/2020 0433   TCO2 17 (L) 12/28/2020 0433   ACIDBASEDEF 6.0 (H) 12/28/2020 0433   O2SAT 98.0 12/28/2020 0433    - Abd: ND - Extremity: warm  .Intake/Output      07/16 0701 07/17 0700 07/17 0701 07/18 0700   P.O. 640    I.V. (mL/kg) 10 (0.2)    IV Piggyback     Total Intake(mL/kg) 650 (10.2)    Urine (mL/kg/hr) 1725 (1.1)    Chest Tube 0    Total Output 1725    Net -1075            _______________________________________________________________ Labs: CBC Latest Ref Rng & Units 12/30/2020 12/29/2020 12/28/2020  WBC 4.0 - 10.5 K/uL 9.2 9.6 7.4  Hemoglobin 13.0 - 17.0 g/dL 9.0(L) 8.4(L) 7.8(L)  Hematocrit 39.0 - 52.0 % 26.5(L) 24.7(L) 22.8(L)  Platelets 150 - 400 K/uL 167 132(L) 101(L)   CMP Latest Ref Rng & Units 12/30/2020 12/29/2020 12/28/2020  Glucose 70 - 99 mg/dL 12/30/2020) 621(H) 086(V)  BUN 6 - 20 mg/dL 784(O) 19 18  Creatinine 0.61 - 1.24 mg/dL 96(E 9.52 8.41  Sodium 135 - 145 mmol/L 137 137 137  Potassium 3.5 - 5.1 mmol/L 3.7 4.2 4.3  Chloride 98 - 111 mmol/L 108 110 110  CO2 22 - 32 mmol/L 26 22 22   Calcium 8.9 - 10.3 mg/dL  8.1(L) 8.2(L) 8.1(L)  Total Protein 6.5 - 8.1 g/dL - - -  Total Bilirubin 0.3 - 1.2 mg/dL - - -  Alkaline Phos 38 - 126 U/L - - -  AST 15 - 41 U/L - - -  ALT 0 - 44 U/L - - -    CXR: -  _______________________________________________________________  Assessment and Plan: POD 3 s/p CABG  Neuro: pain controlled.  on Topamax for migraines  CV: on a/s/bb.  Remains on midodrine for low BP Pulm: CT out.  Continue pulm hygiene Renal: creat stable.  Will hold on diuresis fo now GI: on diet Heme: stable ID: [email or web address] Endo: SSI Dispo: floor today   3.24 12/30/2020 9:14 AM

## 2020-12-31 ENCOUNTER — Other Ambulatory Visit (HOSPITAL_COMMUNITY): Payer: Medicaid Other

## 2020-12-31 LAB — BASIC METABOLIC PANEL
Anion gap: 10 (ref 5–15)
BUN: 17 mg/dL (ref 6–20)
CO2: 22 mmol/L (ref 22–32)
Calcium: 9 mg/dL (ref 8.9–10.3)
Chloride: 106 mmol/L (ref 98–111)
Creatinine, Ser: 0.85 mg/dL (ref 0.61–1.24)
GFR, Estimated: >60 mL/min (ref >60)
Glucose, Bld: 115 mg/dL — ABNORMAL HIGH (ref 70–99)
Potassium: 4.2 mmol/L (ref 3.5–5.1)
Sodium: 138 mmol/L (ref 135–145)

## 2020-12-31 LAB — CBC
HCT: 26.5 % — ABNORMAL LOW (ref 39.0–52.0)
Hemoglobin: 9.3 g/dL — ABNORMAL LOW (ref 13.0–17.0)
MCH: 30.1 pg (ref 26.0–34.0)
MCHC: 35.1 g/dL (ref 30.0–36.0)
MCV: 85.8 fL (ref 80.0–100.0)
Platelets: 189 10*3/uL (ref 150–400)
RBC: 3.09 MIL/uL — ABNORMAL LOW (ref 4.22–5.81)
RDW: 13.1 % (ref 11.5–15.5)
WBC: 8.2 10*3/uL (ref 4.0–10.5)
nRBC: 0 % (ref 0.0–0.2)

## 2020-12-31 LAB — GLUCOSE, CAPILLARY
Glucose-Capillary: 104 mg/dL — ABNORMAL HIGH (ref 70–99)
Glucose-Capillary: 123 mg/dL — ABNORMAL HIGH (ref 70–99)
Glucose-Capillary: 134 mg/dL — ABNORMAL HIGH (ref 70–99)
Glucose-Capillary: 146 mg/dL — ABNORMAL HIGH (ref 70–99)
Glucose-Capillary: 264 mg/dL — ABNORMAL HIGH (ref 70–99)

## 2020-12-31 MED ORDER — FUROSEMIDE 20 MG PO TABS
20.0000 mg | ORAL_TABLET | Freq: Every day | ORAL | Status: DC
  Administered 2020-12-31: 20 mg via ORAL
  Filled 2020-12-31: qty 1

## 2020-12-31 MED ORDER — TRAMADOL HCL 50 MG PO TABS
50.0000 mg | ORAL_TABLET | ORAL | Status: DC | PRN
Start: 2020-12-31 — End: 2021-01-05
  Administered 2020-12-31 – 2021-01-04 (×2): 50 mg via ORAL
  Filled 2020-12-31: qty 1

## 2020-12-31 MED ORDER — TOPIRAMATE 25 MG PO TABS
25.0000 mg | ORAL_TABLET | Freq: Two times a day (BID) | ORAL | Status: DC
  Administered 2020-12-31 – 2021-01-05 (×11): 25 mg via ORAL
  Filled 2020-12-31 (×11): qty 1

## 2020-12-31 MED ORDER — POTASSIUM CHLORIDE CRYS ER 20 MEQ PO TBCR
20.0000 meq | EXTENDED_RELEASE_TABLET | Freq: Every day | ORAL | Status: DC
  Administered 2020-12-31: 20 meq via ORAL
  Filled 2020-12-31: qty 1

## 2020-12-31 MED ORDER — INSULIN ASPART 100 UNIT/ML IJ SOLN
0.0000 [IU] | INTRAMUSCULAR | Status: DC
Start: 2020-12-31 — End: 2021-01-01
  Administered 2020-12-31 (×2): 2 [IU] via SUBCUTANEOUS
  Administered 2020-12-31: 12 [IU] via SUBCUTANEOUS
  Administered 2020-12-31: 2 [IU] via SUBCUTANEOUS

## 2020-12-31 NOTE — Discharge Instructions (Signed)

## 2020-12-31 NOTE — Progress Notes (Signed)
CARDIAC REHAB PHASE I   PRE:  Rate/Rhythm: 105 ST with occ PAC    BP: lying 132/68    SaO2: 96 RA  MODE:  Ambulation: 90 ft   POST:  Rate/Rhythm: 125 ST many PACs    BP: sitting 85/52, recheck 116/71 sitting back     SaO2: 98 RA  Pt sleepy. Needed reminders to remember his OHS.  Able to follow commands slowly with prompts. Stood min assist and walked slowly with gait belt and RW, assist x2. Veered right at times. Fatigue with distance, HR up. To EOB, BP low. Pt does st he is dizzy. Return to bed and BP increased. Assisted pt with his breakfast tray, ate a few bites but did not want anymore. C/o sternal pain and sleepiness. Left on bed alarm, pads on floor. Pt practiced IS, 250 ml. [phone]  Harriet Masson CES, ACSM 12/31/2020 9:08 AM

## 2020-12-31 NOTE — Progress Notes (Addendum)
Patient BP 88/51. MAP 64. Midodrine 5mg  given. No change from prior assessment. PA paged. Awaiting response. Will continue to monitor.  , RN

## 2020-12-31 NOTE — Progress Notes (Addendum)
     [facility] [address]       [place] [address]             [phone]      4 Days Post-Op Procedure(s) (LRB): CORONARY ARTERY BYPASS GRAFTING (CABG) X FOUR ON PUMP USING LEFT INTERNAL MAMMARY ARTERY AND RIGHT ENDOSCOPIC GREATER SAPHENOUS VEIN CONDUITS (N/A) TRANSESOPHAGEAL ECHOCARDIOGRAM (TEE) (N/A) APPLICATION OF CELL SAVER Subjective: Transferred from ICU to 4E yesterday.  Up walking with the rehab team, progressing slowly with mobility, weak and drowsy after return to bed from walk.    Objective: Vital signs in last 24 hours: Temp:  [97.7 F (36.5 C)-98.7 F (37.1 C)] 98.2 F (36.8 C) (07/18 0345) Pulse Rate:  [95-112] 103 (07/18 0345) Cardiac Rhythm: Sinus tachycardia (07/17 1905) Resp:  [16-23] 20 (07/18 0345) BP: (87-147)/(60-83) 116/62 (07/18 0345) SpO2:  [95 %-100 %] 95 % (07/18 0345) Weight:  [62.8 kg] 62.8 kg (07/18 0447)    Intake/Output from previous day: 07/17 0701 - 07/18 0700 In: 19 [I.V.:19] Out: 1875 [Urine:1875] Intake/Output this shift: No intake/output data recorded.  General appearance: weak, sleepy, cooperative, no apparent distress Neurologic: intact Heart: SR, ST. Noted to have some sinus arrhythmia and PAC's while walking.  Lungs: breath sounds are clear but shallow, maintaining sats on RA.  Abdomen: soft, NT.  Extremities: minimal edema. Wound: incisions in  Lab Results: Recent Labs    12/30/20 0055 12/31/20 0522  WBC 9.2 8.2  HGB 9.0* 9.3*  HCT 26.5* 26.5*  PLT 167 189   BMET:  Recent Labs    12/30/20 0055 12/31/20 0522  NA 137 138  K 3.7 4.2  CL 108 106  CO2 26 22  GLUCOSE 133* 115*  BUN 22* 17  CREATININE 0.87 0.85  CALCIUM 8.1* 9.0    PT/INR: No results for input(s): LABPROT, INR in the last 72 hours. ABG    Component Value Date/Time   PHART 7.485 (H) 12/28/2020 0433   HCO3 16.8 (L) 12/28/2020 0433   TCO2 17 (L) 12/28/2020 0433   ACIDBASEDEF 6.0 (H) 12/28/2020  0433   O2SAT 98.0 12/28/2020 0433   CBG (last 3)  Recent Labs    12/30/20 2016 12/30/20 2304 12/31/20 0332  GLUCAP 142* 117* 104*    Assessment/Plan: S/P Procedure(s) (LRB): CORONARY ARTERY BYPASS GRAFTING (CABG) X FOUR ON PUMP USING LEFT INTERNAL MAMMARY ARTERY AND RIGHT ENDOSCOPIC GREATER SAPHENOUS VEIN CONDUITS (N/A) TRANSESOPHAGEAL ECHOCARDIOGRAM (TEE) (N/A) APPLICATION OF CELL SAVER  -POD-4 CABG x 4, normal EF pre-op.  Progressing slowly with mobility. BP improving but still supported with Midodrine. On ASA, low-dose metoprolol, statin. D/C pacer wires, 2V CXR in AM.   -Post-op volume excess- Wt ~ 10lbs positive but does not appear volume overloaded on exam.   Will begin gentle diuresis since BP now improved. Continue Midodrine for now.   -Type 2 DM- pre-op A1C 9.9. Glucose well controlled, continue SSI but change to AC/HS.   -Expected acute blood loss anemia- tolerating well and stable. Monitor, supplement Fe++.  -History of anxiety--on Buspar and Cymbalta  -History of migraine- on Topamax 50mg  BID, may be contributing to drowsiness. Will try decreasing the dose to 25mg  BID.   -DVT PPX- on daily SQ enoxaparin.     LOS: 7 days    , Leary Roca 12/31/2020  Agree with above Mental status improving Continue PT  Alarik Radu O Peregrine Nolt

## 2020-12-31 NOTE — Progress Notes (Signed)
Patient helped to bedside commode by staff. Wife refused for staff to stay in room while on [facility]. Wife stated "get out of the room, I got it."  After pt was done on the bsc, wife called for help to get pt back to bed. Staff put pt back into bed. Pt appears to be resting comfortably.  Kenard Gower, RN

## 2021-01-01 ENCOUNTER — Inpatient Hospital Stay (HOSPITAL_COMMUNITY): Payer: Medicaid Other

## 2021-01-01 LAB — BASIC METABOLIC PANEL
Anion gap: 4 — ABNORMAL LOW (ref 5–15)
BUN: 19 mg/dL (ref 6–20)
CO2: 25 mmol/L (ref 22–32)
Calcium: 8.9 mg/dL (ref 8.9–10.3)
Chloride: 108 mmol/L (ref 98–111)
Creatinine, Ser: 0.94 mg/dL (ref 0.61–1.24)
GFR, Estimated: >60 mL/min (ref >60)
Glucose, Bld: 222 mg/dL — ABNORMAL HIGH (ref 70–99)
Potassium: 3.9 mmol/L (ref 3.5–5.1)
Sodium: 137 mmol/L (ref 135–145)

## 2021-01-01 LAB — GLUCOSE, CAPILLARY
Glucose-Capillary: 97 mg/dL (ref 70–99)
Glucose-Capillary: 121 mg/dL — ABNORMAL HIGH (ref 70–99)
Glucose-Capillary: 144 mg/dL — ABNORMAL HIGH (ref 70–99)
Glucose-Capillary: 148 mg/dL — ABNORMAL HIGH (ref 70–99)
Glucose-Capillary: 166 mg/dL — ABNORMAL HIGH (ref 70–99)

## 2021-01-01 LAB — CBC
HCT: 27.7 % — ABNORMAL LOW (ref 39.0–52.0)
Hemoglobin: 9.6 g/dL — ABNORMAL LOW (ref 13.0–17.0)
MCH: 29.5 pg (ref 26.0–34.0)
MCHC: 34.7 g/dL (ref 30.0–36.0)
MCV: 85.2 fL (ref 80.0–100.0)
Platelets: 227 10*3/uL (ref 150–400)
RBC: 3.25 MIL/uL — ABNORMAL LOW (ref 4.22–5.81)
RDW: 13 % (ref 11.5–15.5)
WBC: 6.3 10*3/uL (ref 4.0–10.5)
nRBC: 0 % (ref 0.0–0.2)

## 2021-01-01 IMAGING — DX DG CHEST 2V
2 series · 2 of 2 positions shown · non-contrast
Comparison: [DATE] chest radiograph.

CLINICAL DATA: Chest soreness, recent cardiac surgery

EXAM:
CHEST - 2 VIEW

[x chest ap]
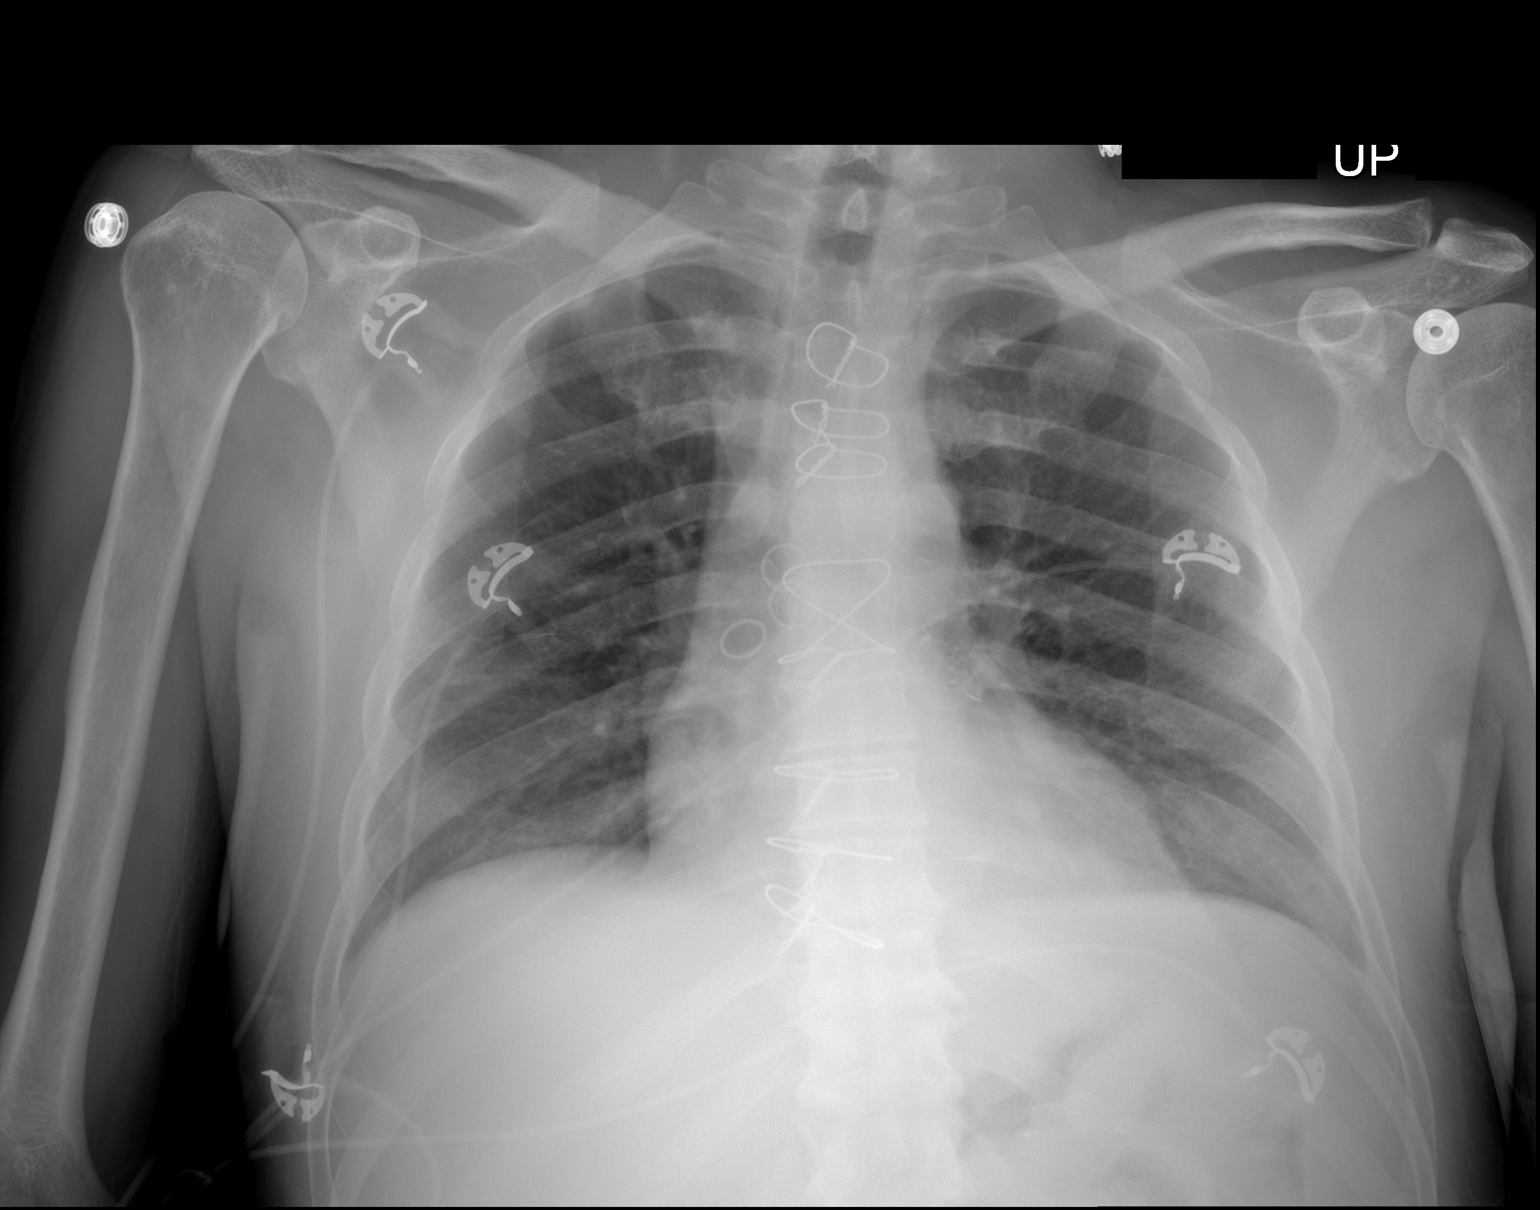

[w chest lat]
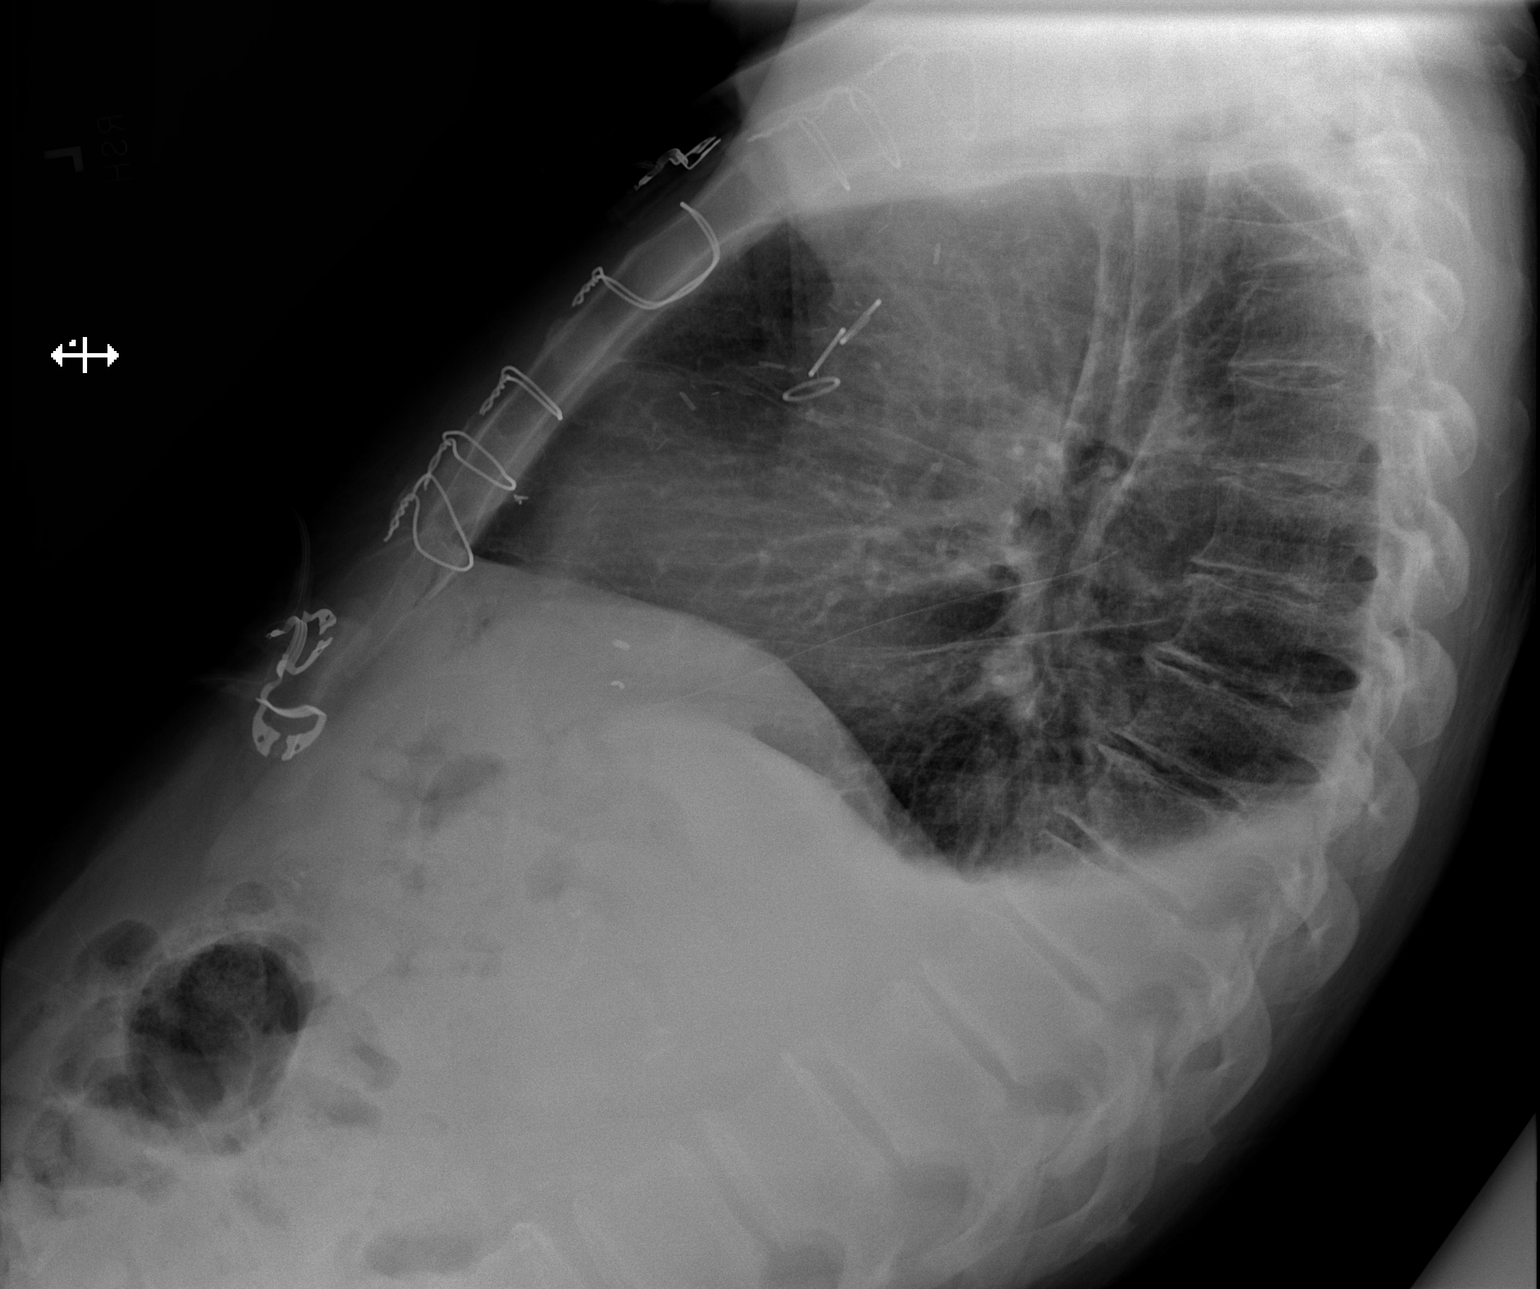

[2 of 2 positions shown; findings below may reference images not displayed]

FINDINGS: Intact sternotomy wires. Stable cardiomediastinal silhouette with
normal heart size. No pneumothorax. Trace bilateral pleural
effusions. No pulmonary edema. No acute consolidative airspace
disease.
IMPRESSION: Trace bilateral pleural effusions.  Otherwise no active disease.

## 2021-01-01 MED ORDER — INSULIN ASPART 100 UNIT/ML IJ SOLN
0.0000 [IU] | Freq: Three times a day (TID) | INTRAMUSCULAR | Status: DC
  Administered 2021-01-01: 4 [IU] via SUBCUTANEOUS
  Administered 2021-01-01 (×2): 2 [IU] via SUBCUTANEOUS
  Administered 2021-01-02: 8 [IU] via SUBCUTANEOUS
  Administered 2021-01-02: 2 [IU] via SUBCUTANEOUS
  Administered 2021-01-02 – 2021-01-03 (×2): 8 [IU] via SUBCUTANEOUS
  Administered 2021-01-03: 4 [IU] via SUBCUTANEOUS
  Administered 2021-01-03 – 2021-01-04 (×4): 8 [IU] via SUBCUTANEOUS
  Administered 2021-01-04: 4 [IU] via SUBCUTANEOUS
  Administered 2021-01-05: 20 [IU] via SUBCUTANEOUS
  Administered 2021-01-05: 4 [IU] via SUBCUTANEOUS

## 2021-01-01 MED ORDER — LIVING WELL WITH DIABETES BOOK
Freq: Once | Status: AC
  Filled 2021-01-01: qty 1

## 2021-01-01 MED ORDER — POTASSIUM CHLORIDE CRYS ER 20 MEQ PO TBCR
20.0000 meq | EXTENDED_RELEASE_TABLET | Freq: Two times a day (BID) | ORAL | Status: AC
  Administered 2021-01-01 (×2): 20 meq via ORAL
  Filled 2021-01-01 (×2): qty 1

## 2021-01-01 MED ORDER — FE FUMARATE-B12-VIT C-FA-IFC PO CAPS
1.0000 | ORAL_CAPSULE | Freq: Three times a day (TID) | ORAL | Status: DC
  Administered 2021-01-01 – 2021-01-05 (×11): 1 via ORAL
  Filled 2021-01-01 (×11): qty 1

## 2021-01-01 NOTE — Evaluation (Signed)
Physical Therapy Evaluation Patient Details Name: [name] MRN: [id number] DOB: 29-Mar-1966 Today's Date: 01/01/2021   History of Present Illness  Pt is a 55 y.o. male admitted 12/24/20 for heart cath; found to have severe three-vessel CAD. 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 PMH includes DM2, CAD, HTN, ataxia, anxiety.   Clinical Impression  Pt presents with an overall decrease in functional mobility secondary to above. PTA, pt reports mod indep with SPC, lives with wife, does not work; question accuracy of information based on pt's current cognitive status. Today, pt able to transfer and ambulate with RW, requiring up to minA for stability and frequent verbal cues for safety and sequencing. Pt requires repeated reminders to maintain sternal precautions with mobility. Pt would benefit from continued acute PT services to maximize functional mobility and independence prior to d/c with HHPT services. If family unable to provide necessary assist/supervision for safety, pt may require post-acute rehab at [facility].     Follow Up Recommendations Home health PT;Supervision/Assistance - 24 hour    Equipment Recommendations   (TBD)    Recommendations for Other Services OT consult     Precautions / Restrictions Precautions Precautions: Fall;Sternal      Mobility  Bed Mobility Overal bed mobility: Needs Assistance Bed Mobility: Supine to Sit     Supine to sit: Supervision     General bed mobility comments: Cues for sternal precautions, pt still coming to long sit with use of bed rails, cues for sequencing and to complete task    Transfers Overall transfer level: Needs assistance Equipment used: Rolling walker (2 wheeled) Transfers: Sit to/from Stand Sit to Stand: Min assist         General transfer comment: Cues for sequencing to maintain sternal precautions as pt attempting to pull up on walker; cues for use of momentum with hands on knees, pt requiring demonstration; able to stand with min  guard, light minA to prevent posterior LOB upon standing  Ambulation/Gait Ambulation/Gait assistance: Min guard;Supervision Gait Distance (Feet): 120 Feet Assistive device: Rolling walker (2 wheeled) Gait Pattern/deviations: Step-through pattern;Decreased stride length;Shuffle;Trunk flexed Gait velocity: Decreased   General Gait Details: Slow, guarded gait with RW and intermittent min guard for balance, progressing to supervision; able to minimally increase gait speed with verbal cues; required encouragement to increase ambulation distance; pt unable to navigate back to room requiring max verbal cues for direction  Stairs            Wheelchair Mobility    Modified Rankin (Stroke Patients Only)       Balance Overall balance assessment: Needs assistance   Sitting balance-Leahy Scale: Fair       Standing balance-Leahy Scale: Fair Standing balance comment: Can static stand without UE support, unable to accept challenge; static and dynamic stability improved with RW                             Pertinent Vitals/Pain Pain Assessment: Faces Faces Pain Scale: Hurts a little bit Pain Location: Generalized, sternal incision Pain Descriptors / Indicators: Discomfort;Tiring Pain Intervention(s): Monitored during session    Home Living Family/patient expects to be discharged to:: Private residence Living Arrangements: Spouse/significant other Available Help at Discharge: Family;Available 24 hours/day Type of Home: House Home Access: Level entry     Home Layout: Two level;Bed/bath upstairs Home Equipment: Cane - single point;Walker - 2 wheels Additional Comments: Unsure pt reliable historian - states, "I think we have a walk in shower..." Reports  wife does not work and available for 24/7 assist    Prior Function Level of Independence: Independent with assistive device(s)         Comments: Reports mod indep with SPC. Does not work. 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        Extremity/Trunk Assessment   Upper Extremity Assessment Upper Extremity Assessment: Overall WFL for tasks assessed    Lower Extremity Assessment Lower Extremity Assessment: Generalized weakness       Communication   Communication: Expressive difficulties  Cognition Arousal/Alertness: Awake/alert Behavior During Therapy: Flat affect Overall Cognitive Status: No family/caregiver present to determine baseline cognitive functioning Area of Impairment: Orientation;Attention;Memory;Following commands;Safety/judgement;Awareness;Problem solving                 Orientation Level: Disoriented to;Time Current Attention Level: Sustained;Selective Memory: Decreased recall of precautions;Decreased short-term memory Following Commands: Follows one step commands inconsistently;Follows one step commands with increased time Safety/Judgement: Decreased awareness of safety;Decreased awareness of deficits Awareness: Intellectual;Emergent Problem Solving: Slow processing;Decreased initiation;Difficulty sequencing;Requires verbal cues General Comments: Oriented to self, location, and CABG; "it's the 7th month, year 2027." Pt slow to answer questions and follow commands, flat affect. Poor carryover of education, requires frequent cues for sternal precautions and sequencing. Appropriately discussing desire to stay off cigarettes even though wife smokes. Seemingly appropriate answers to PLOF and home set-up, but question if reliable historian      General Comments General comments (skin integrity, edema, etc.): Pt reports "a little" dizziness with mobility; post-ambulation BP 101/62    Exercises Other Exercises Other Exercises: Pt with difficulty sequencing incentive spirometer, but able to do so when cued "inhale like a cigarette drag" - pulling ~750 mL x5 reps   Assessment/Plan    PT Assessment Patient needs continued PT services  PT Problem List  Cardiopulmonary status limiting activity       PT Treatment Interventions DME instruction;Gait training;Stair training;Functional mobility training;Therapeutic activities;Therapeutic exercise;Balance training;Patient/family education;Cognitive remediation    PT Goals (Current goals can be found in the Care Plan section)  Acute Rehab PT Goals Patient Stated Goal: "I hope my wife can take me home tomorrow" and "stay off cigarettes"    Frequency Min 3X/week   Barriers to discharge        Co-evaluation               AM-PAC PT "6 Clicks" Mobility  Outcome Measure Help needed turning from your back to your side while in a flat bed without using bedrails?: None Help needed moving from lying on your back to sitting on the side of a flat bed without using bedrails?: A Little Help needed moving to and from a bed to a chair (including a wheelchair)?: A Little Help needed standing up from a chair using your arms (e.g., wheelchair or bedside chair)?: A Little Help needed to walk in hospital room?: A Little Help needed climbing 3-5 steps with a railing? : A Little 6 Click Score: 19    End of Session Equipment Utilized During Treatment: Gait belt Activity Tolerance: Patient tolerated treatment well Patient left: in chair;with call bell/phone within reach;with chair alarm set Nurse Communication: Mobility status PT Visit Diagnosis: Other abnormalities of gait and mobility (R26.89);Muscle weakness (generalized) (M62.81)    Time: 2330-0762 PT Time Calculation (min) (ACUTE ONLY): 26 min   Charges:   PT Evaluation $PT Eval Moderate Complexity: 1 Mod PT Treatments $Gait Training: 8-22 mins      Ina Homes,  PT, DPT Acute Rehabilitation Services  Pager [phone] Office [phone]  Malachy Chamber 01/01/2021, 5:29 PM

## 2021-01-01 NOTE — Progress Notes (Addendum)
     [facility] [address]       [place] [address]             [phone]      5 Days Post-Op Procedure(s) (LRB): CORONARY ARTERY BYPASS GRAFTING (CABG) X FOUR ON PUMP USING LEFT INTERNAL MAMMARY ARTERY AND RIGHT ENDOSCOPIC GREATER SAPHENOUS VEIN CONDUITS (N/A) TRANSESOPHAGEAL ECHOCARDIOGRAM (TEE) (N/A) APPLICATION OF CELL SAVER Subjective: More awake and alert today but affect remains flat. Denies pain. Has breakfast tray at the bedside but said he is not interested in eating right now.  Walked in hall one time yesterday. Had BM yesterday.   Objective: Vital signs in last 24 hours: Temp:  [98 F (36.7 C)-98.4 F (36.9 C)] 98.4 F (36.9 C) (07/19 0722) Pulse Rate:  [94-112] 101 (07/19 0722) Cardiac Rhythm: Normal sinus rhythm (07/19 0350) Resp:  [10-20] 16 (07/19 0722) BP: (85-116)/(51-76) 99/55 (07/19 0722) SpO2:  [97 %-99 %] 98 % (07/19 0722) Weight:  [61.7 kg] 61.7 kg (07/19 0235)    Intake/Output from previous day: 07/18 0701 - 07/19 0700 In: 490 [P.O.:490] Out: 1825 [Urine:1825] Intake/Output this shift: No intake/output data recorded.  General appearance: weak, sleepy, cooperative, no apparent distress Neurologic: intact Heart: SR, ST. Lungs: breath sounds are clear but shallow, maintaining sats on RA. CXR OK. Abdomen: soft, NT.  Extremities: minimal edema. Wound: incisions intact and dry.   Lab Results: Recent Labs    12/31/20 0522 01/01/21 0053  WBC 8.2 6.3  HGB 9.3* 9.6*  HCT 26.5* 27.7*  PLT 189 227    BMET:  Recent Labs    12/31/20 0522 01/01/21 0053  NA 138 137  K 4.2 3.9  CL 106 108  CO2 22 25  GLUCOSE 115* 222*  BUN 17 19  CREATININE 0.85 0.94  CALCIUM 9.0 8.9     PT/INR: No results for input(s): LABPROT, INR in the last 72 hours. ABG    Component Value Date/Time   PHART 7.485 (H) 12/28/2020 0433   HCO3 16.8 (L) 12/28/2020 0433   TCO2 17 (L) 12/28/2020 0433   ACIDBASEDEF 6.0 (H)  12/28/2020 0433   O2SAT 98.0 12/28/2020 0433   CBG (last 3)  Recent Labs    12/31/20 2346 01/01/21 0323 01/01/21 0808  GLUCAP 264* 97 121*     Assessment/Plan: S/P Procedure(s) (LRB): CORONARY ARTERY BYPASS GRAFTING (CABG) X FOUR ON PUMP USING LEFT INTERNAL MAMMARY ARTERY AND RIGHT ENDOSCOPIC GREATER SAPHENOUS VEIN CONDUITS (N/A) TRANSESOPHAGEAL ECHOCARDIOGRAM (TEE) (N/A) APPLICATION OF CELL SAVER  -POD-5 CABG x 4, normal EF pre-op.  Progressing slowly with mobility. BP is soft, continue support with Midodrine. On ASA, low-dose metoprolol, statin.    -Post-op volume excess- Wt now ~ 4lbs positive but does not appear volume overloaded on exam.   Stop Lasix.   -Type 2 DM- pre-op A1C 9.9. Glucose control reasonable, one spike to 264 yesterday, continue SSI AC/HS.   -Expected acute blood loss anemia- tolerating well and stable. Monitor, supplement Fe++.  -History of anxiety--on Buspar and Cymbalta  -History of migraine- on Topamax 25mg  BID  -DVT PPX- on daily SQ enoxaparin.   -Disposition- planning discharge to home when he is independent with mobility and self care.    LOS: 8 days    , Leary Roca [place] 01/01/2021  Agree with above. Dispo planning.  Maleke Feria 01/03/2021

## 2021-01-01 NOTE — Progress Notes (Signed)
Inpatient Diabetes Program Recommendations  AACE/ADA: New Consensus Statement on Inpatient Glycemic Control (2015)  Target Ranges:  Prepandial:   less than 140 mg/dL      Peak postprandial:   less than 180 mg/dL (1-2 hours)      Critically ill patients:  140 - 180 mg/dL   Lab Results  Component Value Date   GLUCAP 144 (H) 01/01/2021   HGBA1C 9.9 (H) 12/26/2020    Review of Glycemic Control Results for TORBEN, SOLOWAY (MRN [id number]) as of 01/01/2021 14:47  Ref. Range 12/31/2020 19:38 12/31/2020 23:46 01/01/2021 03:23 01/01/2021 08:08 01/01/2021 11:52  Glucose-Capillary Latest Ref Range: 70 - 99 mg/dL 505 (H) 397 (H) 97 673 (H) 144 (H)   Diabetes history: DM Outpatient Diabetes medications: Basaglar 12 units QD, Metformin 500 mg BID, Glipizide 10 mg BID Current orders for Inpatient glycemic control: Novolog 0-24 units TID and HS  Spoke with patient at bedside.  He states he take 12 units daily of insulin but cannot remember the name of his insulin.  He cannot remember the name of his oral medications either.  Asked him about his last A1C and he does not know.  He states he checks his BS once a day and it is usually 200-300 mg/dl.  He states he does not drink any beverages with sugar.  Discussed importance of glucose control, short and long term complications of uncontrolled blood sugar.  He has a very flat affect and it is difficult to hold a conversation with him.  Ordered LWWD booklet.  Attempted to call spouse, Maralyn Sago [phone] with no answer.    Will continue to follow while inpatient.  Thank you, Dulce Sellar, RN, BSN Diabetes Coordinator Inpatient Diabetes Program [phone] (team pager from 8a-5p)

## 2021-01-01 NOTE — Progress Notes (Addendum)
CARDIAC REHAB PHASE I   PRE:  Rate/Rhythm: 103 ST  BP:  Lying: 111/66      SaO2: 99 RA  MODE:  Ambulation: 90 ft   POST:  Rate/Rhythm: 121 ST  BP:  Sitting: 102/64      SaO2: 99 RA  Pt laying in bed awake when I came in. Pt followed commands slowly w/ prompts. Wife had to repeat a few of my commands. Moderate assist to sit up w/ sternal precaution cues as he tried to grab walker. Pt stood w/ min assist and walked slowly w/ gait belt and front wheel walker, x2. Veered both ways at times because he looked down at the floor. Cued to look up. Became somewhat unsteady when he looked up. Pt to bed after walk. Encouraged pt to use IS. He stated that it didn't work but told him to still try. Physical Therapy order in. [id number]  Joya San, MS, ACM-CEP 01/01/2021 11:49 AM

## 2021-01-02 LAB — BASIC METABOLIC PANEL
Anion gap: 12 (ref 5–15)
BUN: 14 mg/dL (ref 6–20)
CO2: 21 mmol/L — ABNORMAL LOW (ref 22–32)
Calcium: 9.3 mg/dL (ref 8.9–10.3)
Chloride: 104 mmol/L (ref 98–111)
Creatinine, Ser: 0.84 mg/dL (ref 0.61–1.24)
GFR, Estimated: >60 mL/min (ref >60)
Glucose, Bld: 98 mg/dL (ref 70–99)
Potassium: 4 mmol/L (ref 3.5–5.1)
Sodium: 137 mmol/L (ref 135–145)

## 2021-01-02 LAB — GLUCOSE, CAPILLARY
Glucose-Capillary: 101 mg/dL — ABNORMAL HIGH (ref 70–99)
Glucose-Capillary: 135 mg/dL — ABNORMAL HIGH (ref 70–99)
Glucose-Capillary: 231 mg/dL — ABNORMAL HIGH (ref 70–99)
Glucose-Capillary: 206 mg/dL — ABNORMAL HIGH (ref 70–99)

## 2021-01-02 MED ORDER — MIDODRINE HCL 5 MG PO TABS
2.5000 mg | ORAL_TABLET | Freq: Three times a day (TID) | ORAL | Status: DC
  Administered 2021-01-02 – 2021-01-03 (×3): 2.5 mg via ORAL
  Filled 2021-01-02 (×3): qty 1

## 2021-01-02 MED FILL — Heparin Sodium (Porcine) Inj 1000 Unit/ML: INTRAMUSCULAR | Qty: 30 | Status: AC

## 2021-01-02 MED FILL — Lidocaine HCl Local Preservative Free (PF) Inj 2%: INTRAMUSCULAR | Qty: 15 | Status: AC

## 2021-01-02 MED FILL — Potassium Chloride Inj 2 mEq/ML: INTRAVENOUS | Qty: 40 | Status: AC

## 2021-01-02 NOTE — Evaluation (Addendum)
Occupational Therapy Evaluation Patient Details Name: [name] MRN: [id number] DOB: 04/24/1966 Today's Date: 01/02/2021    History of Present Illness Pt is a 55 y.o. male admitted 12/24/20 for heart cath; found to have severe three-vessel CAD. 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 PMH includes DM2, CAD, HTN, ataxia, anxiety.   Clinical Impression   Pt presents with decline in function and safety with ADLs and ADL mobility with impaired strength, balance, endurance and cognition (impaired memory and safety awareness). PTA, pt reports that he lives with his wife, was Ind with ADLs/selfcare, used a cane for mobility and does not work. Pt currently requires min A with LB ADLs, min guard A with toileting tasks, grooming and min A with RW for mobility. Pt fatigues easily and requires frequent verbal cues for safety and sequencing. Pt requires repeated reminders to maintain sternal precautions with STM deficits. Pt would benefit from acute OT services to address impairments to maximize level of function and safety     Follow Up Recommendations  Home health OT;Supervision - Intermittent    Equipment Recommendations  Tub/shower seat    Recommendations for Other Services       Precautions / Restrictions Precautions Precautions: Fall;Sternal Precaution Booklet Issued: Yes (comment) Precaution Comments: reviewed sterna precautions with pt Restrictions Weight Bearing Restrictions: Yes RUE Weight Bearing: Non weight bearing Other Position/Activity Restrictions: sternal precautions      Mobility Bed Mobility Overal bed mobility: Needs Assistance Bed Mobility: Supine to Sit;Sit to Supine     Supine to sit: Supervision Sit to supine: Supervision   General bed mobility comments: Cues for sternal precautions    Transfers Overall transfer level: Needs assistance Equipment used: Rolling walker (2 wheeled) Transfers: Sit to/from Stand Sit to Stand: Min assist;Min guard         General transfer  comment: Cues for sequencing to maintain sternal precautions as pt attempting to pull up on walker    Balance Overall balance assessment: Needs assistance Sitting-balance support: No upper extremity supported;Feet supported Sitting balance-Leahy Scale: Fair Sitting balance - Comments: static sitting no LOB unsupported   Standing balance support: Bilateral upper extremity supported;During functional activity Standing balance-Leahy Scale: Fair Standing balance comment: Can static stand without UE support, unable to accept challenge; static and dynamic stability improved with RW                           ADL either performed or assessed with clinical judgement   ADL Overall ADL's : Needs assistance/impaired Eating/Feeding: Set up;Sitting   Grooming: Wash/dry hands;Wash/dry face;Min guard;Standing   Upper Body Bathing: Set up;Supervision/ safety;Sitting   Lower Body Bathing: Minimal assistance   Upper Body Dressing : Set up;Supervision/safety;Sitting   Lower Body Dressing: Minimal assistance   Toilet Transfer: Minimal assistance;Min guard;Ambulation;RW;Comfort height toilet;Cueing for safety   Toileting- Clothing Manipulation and Hygiene: Min guard;Sit to/from stand       Functional mobility during ADLs: Minimal assistance;Min guard;Rolling walker;Cueing for safety;Cueing for sequencing       Vision Baseline Vision/History: Wears glasses Wears Glasses: Reading only Patient Visual Report: No change from baseline       Perception     Praxis      Pertinent Vitals/Pain Pain Assessment: No/denies pain Faces Pain Scale: Hurts a little bit Pain Location: Generalized, sternal incision Pain Descriptors / Indicators: Discomfort;Sore Pain Intervention(s): Limited activity within patient's tolerance;Monitored during session;Repositioned     Hand Dominance Right   Extremity/Trunk Assessment Upper Extremity Assessment Upper Extremity  Assessment: Generalized  weakness   Lower Extremity Assessment Lower Extremity Assessment: Defer to PT evaluation   Cervical / Trunk Assessment Cervical / Trunk Assessment: Normal   Communication Communication Communication: No difficulties   Cognition Arousal/Alertness: Awake/alert Behavior During Therapy: Flat affect Overall Cognitive Status: No family/caregiver present to determine baseline cognitive functioning Area of Impairment: Orientation;Attention;Memory;Following commands;Safety/judgement;Awareness;Problem solving                 Orientation Level:  (UTA, pt minimally verbal and spouse answering questions on his behalf) Current Attention Level: Sustained;Selective Memory: Decreased recall of precautions;Decreased short-term memory Following Commands: Follows one step commands with increased time Safety/Judgement: Decreased awareness of safety;Decreased awareness of deficits Awareness: Intellectual;Emergent Problem Solving: Slow processing;Decreased initiation;Difficulty sequencing;Requires verbal cues General Comments: Pt slow to answer questions and follow commands, flat affect. Poor carryover of education, requires frequent cues for sternal precautions and sequencing   General Comments  VSS stable    Exercises    Shoulder Instructions      Home Living Family/patient expects to be discharged to:: Private residence Living Arrangements: Spouse/significant other Available Help at Discharge: Family;Available 24 hours/day Type of Home: House Home Access: Level entry     Home Layout: Two level;Bed/bath upstairs Alternate Level Stairs-Number of Steps: Flight Alternate Level Stairs-Rails: Right Bathroom Shower/Tub: Producer, television/film/video: Standard     Home Equipment: Environmental consultant - 2 wheels   Additional Comments: Unsure pt reliable historian - states, "I think we have a walk in shower..." Reports wife does not work and available for 24/7 assist      Prior  Functioning/Environment          Comments: Reports Ind with ADLs/selfcare, mod indep with SPC, pt reports that he deos not work        OT Problem List: Decreased strength;Impaired balance (sitting and/or standing);Decreased cognition;Decreased safety awareness;Decreased activity tolerance;Decreased knowledge of use of DME or AE      OT Treatment/Interventions: Self-care/ADL training;Patient/family education;Therapeutic activities;DME and/or AE instruction;Energy conservation    OT Goals(Current goals can be found in the care plan section) Acute Rehab OT Goals Patient Stated Goal: to go home OT Goal Formulation: With patient Time For Goal Achievement: 01/16/21 Potential to Achieve Goals: Good ADL Goals Pt Will Perform Grooming: with supervision;with set-up;standing;with caregiver independent in assisting Pt Will Perform Upper Body Bathing: with set-up;with modified independence;with caregiver independent in assisting Pt Will Perform Lower Body Bathing: with min guard assist;with supervision;with caregiver independent in assisting;sit to/from stand Pt Will Perform Upper Body Dressing: with set-up;with modified independence;with caregiver independent in assisting Pt Will Perform Lower Body Dressing: with min guard assist;with supervision;with caregiver independent in assisting;sit to/from stand Pt Will Transfer to Toilet: with min guard assist;with supervision;ambulating Pt Will Perform Toileting - Clothing Manipulation and hygiene: with supervision;with modified independence;sit to/from stand;with caregiver independent in assisting Pt Will Perform Tub/Shower Transfer: with min guard assist;with supervision;ambulating;shower seat;3 in 1;with caregiver independent in assisting  OT Frequency: Min 2X/week   Barriers to D/C:            Co-evaluation              AM-PAC OT "6 Clicks" Daily Activity     Outcome Measure Help from another person eating meals?: None Help from another  person taking care of personal grooming?: A Little Help from another person toileting, which includes using toliet, bedpan, or urinal?: A Little Help from another person bathing (including washing, rinsing, drying)?: A Little Help from another person  to put on and taking off regular upper body clothing?: A Little Help from another person to put on and taking off regular lower body clothing?: A Little 6 Click Score: 19   End of Session Equipment Utilized During Treatment: Gait belt;Rolling walker  Activity Tolerance: Patient limited by fatigue Patient left: in bed;with call bell/phone within reach;with bed alarm set  OT Visit Diagnosis: Unsteadiness on feet (R26.81);Other abnormalities of gait and mobility (R26.89);Muscle weakness (generalized) (M62.81);Other symptoms and signs involving cognitive function                Time: 1009-1030 OT Time Calculation (min): 21 min Charges:  OT General Charges $OT Visit: 1 Visit OT Evaluation $OT Eval Moderate Complexity: 1 Mod OT Treatments $Self Care/Home Management : 8-22 mins    Galen Manila 01/02/2021, 1:13 PM

## 2021-01-02 NOTE — Progress Notes (Addendum)
     [facility] [address]       [place] [id number]             [phone]      6 Days Post-Op Procedure(s) (LRB): CORONARY ARTERY BYPASS GRAFTING (CABG) X FOUR ON PUMP USING LEFT INTERNAL MAMMARY ARTERY AND RIGHT ENDOSCOPIC GREATER SAPHENOUS VEIN CONDUITS (N/A) TRANSESOPHAGEAL ECHOCARDIOGRAM (TEE) (N/A) APPLICATION OF CELL SAVER Subjective: Awake with brighter affect today.  Required a lot of assistance with ambulation yesterday but feels he can do better today. Walked in hall one time yesterday. Had BM  x 2 yesterday.   Objective: Vital signs in last 24 hours: Temp:  [97.6 F (36.4 C)-98.5 F (36.9 C)] 98.2 F (36.8 C) (07/20 0739) Pulse Rate:  [93-109] 109 (07/20 0739) Cardiac Rhythm: Normal sinus rhythm;Sinus tachycardia (07/20 0340) Resp:  [16-20] 16 (07/20 0739) BP: (102-125)/(62-78) 114/74 (07/20 0739) SpO2:  [96 %-98 %] 98 % (07/20 0739) Weight:  [60.3 kg] 60.3 kg (07/20 0353)    Intake/Output from previous day: 07/19 0701 - 07/20 0700 In: 320 [P.O.:320] Out: 3900 [Urine:3900] Intake/Output this shift: No intake/output data recorded.  General appearance: weak, sleepy, cooperative, no apparent distress Neurologic: intact Heart: SR, ST. Lungs: breath sounds are clear but shallow, maintaining sats on RA.  Abdomen: soft, NT.  Extremities: no edema. Wound: incisions intact and dry.   Lab Results: Recent Labs    12/31/20 0522 01/01/21 0053  WBC 8.2 6.3  HGB 9.3* 9.6*  HCT 26.5* 27.7*  PLT 189 227    BMET:  Recent Labs    01/01/21 0053 01/02/21 0344  NA 137 137  K 3.9 4.0  CL 108 104  CO2 25 21*  GLUCOSE 222* 98  BUN 19 14  CREATININE 0.94 0.84  CALCIUM 8.9 9.3     PT/INR: No results for input(s): LABPROT, INR in the last 72 hours. ABG    Component Value Date/Time   PHART 7.485 (H) 12/28/2020 0433   HCO3 16.8 (L) 12/28/2020 0433   TCO2 17 (L) 12/28/2020 0433   ACIDBASEDEF 6.0 (H) 12/28/2020  0433   O2SAT 98.0 12/28/2020 0433   CBG (last 3)  Recent Labs    01/01/21 1601 01/01/21 2134 01/02/21 0628  GLUCAP 148* 166* 101*     Assessment/Plan: S/P Procedure(s) (LRB): CORONARY ARTERY BYPASS GRAFTING (CABG) X FOUR ON PUMP USING LEFT INTERNAL MAMMARY ARTERY AND RIGHT ENDOSCOPIC GREATER SAPHENOUS VEIN CONDUITS (N/A) TRANSESOPHAGEAL ECHOCARDIOGRAM (TEE) (N/A) APPLICATION OF CELL SAVER  -POD-6 CABG x 4, normal EF pre-op.  Progressing slowly with mobility. BP gradually improving, will decrease Midodrine. On ASA, low-dose metoprolol, statin.    -Post-op volume excess- Wt now ~ 3lbs positive but does not appear volume overloaded on exam.   Diuresed well yesterday without Lasix.   -Type 2 DM- pre-op A1C 9.9. Glucose control reasonable, continue SSI AC/HS.   -Expected acute blood loss anemia- tolerating well and stable. Monitor, supplement Fe++.  -History of anxiety--on Buspar and Cymbalta  -History of migraine- on Topamax 25mg  BID  -DVT PPX- on daily SQ enoxaparin.   -Disposition- Needs to walk more. Planning discharge to home when he is independent with mobility and self care. Anticipate he will be ready in 1-2 days.     LOS: 9 days    , Leary [place] 01/02/2021   Agree with above. Continue physical therapy. Dispo planning.  Geraldene Eisel 01/04/2021

## 2021-01-02 NOTE — Progress Notes (Signed)
Physical Therapy Treatment Patient Details Name: [name] MRN: [id number] DOB: 02/03/1966 Today's Date: 01/02/2021    History of Present Illness Pt is a 55 y.o. male admitted 12/24/20 for heart cath; found to have severe three-vessel CAD. 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 PMH includes DM2, CAD, HTN, ataxia, anxiety.    PT Comments    Pt received on bedside commode, agreeable to therapy session and with fair tolerance for transfers and poor gait tolerance due to symptomatic orthostatic hypotension. BP 73/53 (61) seated after using BSC and pale pallor, BP improved to 92/55 (68) once returned to supine. Pt needing increased time and verbal cues for task sequencing and safety within precautions, decreased carryover of instruction from previous session. PA/RN notified of hypotension and he could benefit from knee-high TED hose trial to see if it helps with hemodynamic stability. Pt continues to benefit from PT services to progress toward functional mobility goals.   Follow Up Recommendations  Home health PT;Supervision/Assistance - 24 hour     Equipment Recommendations       Recommendations for Other Services       Precautions / Restrictions Precautions Precautions: Fall;Sternal Precaution Booklet Issued: Yes (comment) Precaution Comments: handout given and sternal post-op HEP also given Restrictions Weight Bearing Restrictions: No Other Position/Activity Restrictions: sternal precs    Mobility  Bed Mobility Overal bed mobility: Needs Assistance Bed Mobility: Supine to Sit     Supine to sit: Supervision     General bed mobility comments: Cues for sternal precautions, pt still coming to long sit with use of bed rails, cues for sequencing and to complete task    Transfers Overall transfer level: Needs assistance Equipment used: Rolling walker (2 wheeled) Transfers: Sit to/from Stand Sit to Stand: Min assist         General transfer comment: Cues for sequencing to maintain sternal  precautions as pt attempting to pull up on walker; cues for use of momentum with hands on knees, pt requiring demonstration; able to stand with min guard, light minA to prevent posterior LOB upon standing  Ambulation/Gait Ambulation/Gait assistance: Min guard Gait Distance (Feet): 10 Feet Assistive device: Rolling walker (2 wheeled) Gait Pattern/deviations: Step-through pattern;Decreased stride length;Shuffle;Trunk flexed Gait velocity: Decreased   General Gait Details: episode of nausea limiting distance, pt with pale pallor and requesting return to bed, noted to be orthostatic and RN notified   Stairs             Wheelchair Mobility    Modified Rankin (Stroke Patients Only)       Balance Overall balance assessment: Needs assistance   Sitting balance-Leahy Scale: Fair Sitting balance - Comments: static sitting no LOB unsupported   Standing balance support: Bilateral upper extremity supported Standing balance-Leahy Scale: Fair Standing balance comment: Can static stand without UE support, unable to accept challenge; static and dynamic stability improved with RW                            Cognition Arousal/Alertness: Awake/alert Behavior During Therapy: Flat affect   Area of Impairment: Orientation;Attention;Memory;Following commands;Safety/judgement;Awareness;Problem solving                 Orientation Level:  (UTA, pt minimally verbal and spouse answering questions on his behalf) Current Attention Level: Sustained;Selective Memory: Decreased recall of precautions;Decreased short-term memory Following Commands: Follows one step commands with increased time Safety/Judgement: Decreased awareness of safety;Decreased awareness of deficits Awareness: Intellectual;Emergent Problem Solving: Slow processing;Decreased initiation;Difficulty sequencing;Requires  verbal cues General Comments: Pt slow to answer questions and follow commands, flat affect. Poor  carryover of education, requires frequent cues for sternal precautions and sequencing. Pt limited due to nausea symptoms and noted to be orthostatic.      Exercises Other Exercises Other Exercises: ankle pumps x5-10 reps ea (minimal effort by pt despite multimodal cues) spouse reports she will reinforce with him later, HEP handout given Other Exercises: IS x 3 reps achieved ~524mL encouraged hourly use once nausea improved    General Comments General comments (skin integrity, edema, etc.): BP 73/53 (61) after ambulation seated EOB (pt c/o nausea). BP 92/55 (68) after return to supine, RN notified. HR 116 bpm with exertion and 101 bpm resting      Pertinent Vitals/Pain Pain Assessment: Faces Faces Pain Scale: Hurts a little bit Pain Location: Generalized, sternal incision Pain Descriptors / Indicators: Discomfort;Tiring Pain Intervention(s): Limited activity within patient's tolerance;Monitored during session;Repositioned    Home Living                      Prior Function            PT Goals (current goals can now be found in the care plan section) Acute Rehab PT Goals Patient Stated Goal: to go home Progress towards PT goals: Progressing toward goals    Frequency    Min 3X/week      PT Plan Current plan remains appropriate    Co-evaluation              AM-PAC PT "6 Clicks" Mobility   Outcome Measure  Help needed turning from your back to your side while in a flat bed without using bedrails?: None Help needed moving from lying on your back to sitting on the side of a flat bed without using bedrails?: A Little Help needed moving to and from a bed to a chair (including a wheelchair)?: A Little Help needed standing up from a chair using your arms (e.g., wheelchair or bedside chair)?: A Little Help needed to walk in hospital room?: A Little Help needed climbing 3-5 steps with a railing? : A Little 6 Click Score: 19    End of Session Equipment Utilized  During Treatment: Gait belt Activity Tolerance: Patient limited by fatigue;Other (comment) (nausea/orthostatic symptoms limiting mobility) Patient left: in bed;with call bell/phone within reach;with family/visitor present (spouse and tele sitter present, spouse defers bed alarm while she is present (she wants to sit EOB)) Nurse Communication: Mobility status;Other (comment) (orthostatic soft BP/nausea) PT Visit Diagnosis: Other abnormalities of gait and mobility (R26.89);Muscle weakness (generalized) (M62.81)     Time: 0174-9449 PT Time Calculation (min) (ACUTE ONLY): 21 min  Charges:  $Therapeutic Activity: 8-22 mins                     Anaiah Mcmannis P., PTA Acute Rehabilitation Services Pager: [phone] Office: [phone]    Dorathy Kinsman Danial Hlavac 01/02/2021, 1:02 PM

## 2021-01-02 NOTE — Progress Notes (Signed)
CARDIAC REHAB PHASE I   PRE:  Rate/Rhythm: 110 ST  BP:  Sitting: 115/73      SaO2: 100 RA  MODE:  Ambulation: 370 ft   POST:  Rate/Rhythm: 124 ST  BP:  Sitting: to BR with OT   Pt ambulated 384ft in hallway assist of one with front wheel walker and gait belt. Pt following directions more today. Able to increase distance, no LOB today. Pt returned to room and taken to BR with OT. Will continue to follow.5    [id number] Reynold Bowen, RN BSN 01/02/2021 10:25 AM

## 2021-01-02 NOTE — Progress Notes (Signed)
Physical Therapy Treatment Patient Details Name: [name] MRN: [id number] DOB: 1965-12-07 Today's Date: 01/02/2021    History of Present Illness Pt is a 55 y.o. male admitted 12/24/20 for heart cath; found to have severe three-vessel CAD. 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 PMH includes DM2, CAD, HTN, ataxia, anxiety.    PT Comments    Pt received seated EOB, pt spouse called PTA back into room as pt impulsive to get back to bathroom and RN/NT not available. Pt progressed gait distance slightly for trial to/from bathroom with RW and min guard but continues to needs cues for sternal precautions with each positional change. Pt BP remains hypotensive and emphasized importance of increased food/beverage intake per PA recommendation to improve hemodynamics as well as leg/UE exercises per handout as warm-up prior to getting OOB. BP 97/56 (67) supine and BP 64/45 (52) standing, RN/PA again notified. Pt continues to benefit from PT services to progress toward functional mobility goals. DME recs clarified below per discussion with spouse.  Follow Up Recommendations  Home health PT;Supervision/Assistance - 24 hour     Equipment Recommendations  Rolling walker with 5" wheels;3in1 (PT)    Recommendations for Other Services       Precautions / Restrictions Precautions Precautions: Fall;Sternal Precaution Booklet Issued: Yes (comment) Precaution Comments: previously given Restrictions Weight Bearing Restrictions: No RUE Weight Bearing: Non weight bearing Other Position/Activity Restrictions: sternal precs    Mobility  Bed Mobility Overal bed mobility: Needs Assistance Bed Mobility: Supine to Sit;Sit to Supine     Supine to sit: Supervision Sit to supine: Supervision   General bed mobility comments: Cues for sternal precautions, pt received seated EOB    Transfers Overall transfer level: Needs assistance Equipment used: Rolling walker (2 wheeled) Transfers: Sit to/from Stand Sit to Stand: Min  assist         General transfer comment: Cues for sequencing to maintain sternal precautions as pt attempting to pull up on walker; able to stand with min guard from EOB and minA from low toilet height to RW  Ambulation/Gait Ambulation/Gait assistance: Min guard Gait Distance (Feet): 15 Feet (x2) Assistive device: Rolling walker (2 wheeled) Gait Pattern/deviations: Step-through pattern;Decreased stride length;Shuffle;Trunk flexed Gait velocity: Decreased   General Gait Details: bowel urgency limiting distance, pt with pale pallor and requesting return to bed, noted to be orthostatic and RN/PA notified   Stairs             Wheelchair Mobility    Modified Rankin (Stroke Patients Only)       Balance Overall balance assessment: Needs assistance Sitting-balance support: No upper extremity supported;Feet supported Sitting balance-Leahy Scale: Fair Sitting balance - Comments: static sitting no LOB unsupported   Standing balance support: Bilateral upper extremity supported Standing balance-Leahy Scale: Fair Standing balance comment: Can static stand without UE support, unable to accept challenge; static and dynamic stability improved with RW                            Cognition Arousal/Alertness: Awake/alert Behavior During Therapy: Flat affect Overall Cognitive Status: No family/caregiver present to determine baseline cognitive functioning Area of Impairment: Orientation;Attention;Memory;Following commands;Safety/judgement;Awareness;Problem solving                 Orientation Level:  (UTA, pt minimally verbal and spouse answering questions on his behalf) Current Attention Level: Sustained;Selective Memory: Decreased recall of precautions;Decreased short-term memory Following Commands: Follows one step commands with increased time Safety/Judgement: Decreased awareness of safety;Decreased  awareness of deficits Awareness: Intellectual;Emergent Problem  Solving: Slow processing;Decreased initiation;Difficulty sequencing;Requires verbal cues General Comments: Pt slow to answer questions and follow commands, flat affect. Poor carryover of education, requires frequent cues for sternal precautions and sequencing. Pt limited due to fatigue and noted to be orthostatic still.      Exercises Other Exercises Other Exercises: ankle pumps x5-10 reps ea (minimal effort by pt despite multimodal cues) spouse reports she will reinforce with him later, HEP handout given Other Exercises: IS x 3 reps achieved ~545mL encouraged hourly use once nausea improved    General Comments General comments (skin integrity, edema, etc.): Pt remains orthostatic, BP 64/45 (52) standing after walking back from bathroom to EOB, BP 82/57 (67) seated EOB and BP 97/56 (67) after return to supine. Encouraged him to eat food and drink fluids as much as possible per PA recommendation.; RN/PA notified of new vitals      Pertinent Vitals/Pain Pain Assessment: No/denies pain Faces Pain Scale: Hurts a little bit Pain Location: Generalized, sternal incision Pain Descriptors / Indicators: Discomfort;Sore Pain Intervention(s): Monitored during session    Home Living Family/patient expects to be discharged to:: Private residence Living Arrangements: Spouse/significant other Available Help at Discharge: Family;Available 24 hours/day Type of Home: House Home Access: Level entry   Home Layout: Two level;Bed/bath upstairs Home Equipment: Walker - 2 wheels Additional Comments: Unsure pt reliable historian - states, "I think we have a walk in shower..." Reports wife does not work and available for 24/7 assist    Prior Function        Comments: Reports Ind with ADLs/selfcare, mod indep with SPC, pt reports that he deos not work   PT Goals (current goals can now be found in the care plan section) Acute Rehab PT Goals Patient Stated Goal: to go home Progress towards PT goals:  Progressing toward goals (hypotension limiting)    Frequency    Min 3X/week      PT Plan Current plan remains appropriate    Co-evaluation              AM-PAC PT "6 Clicks" Mobility   Outcome Measure  Help needed turning from your back to your side while in a flat bed without using bedrails?: None Help needed moving from lying on your back to sitting on the side of a flat bed without using bedrails?: A Little Help needed moving to and from a bed to a chair (including a wheelchair)?: A Little Help needed standing up from a chair using your arms (e.g., wheelchair or bedside chair)?: A Little Help needed to walk in hospital room?: A Little Help needed climbing 3-5 steps with a railing? : A Little 6 Click Score: 19    End of Session Equipment Utilized During Treatment: Gait belt Activity Tolerance: Patient limited by fatigue;Other (comment) (nausea/orthostatic symptoms limiting mobility) Patient left: in bed;with call bell/phone within reach;with family/visitor present;with bed alarm set;Other (comment) (tele sitter present) Nurse Communication: Mobility status;Other (comment) (orthostatic hypotension, pt wants a snack) PT Visit Diagnosis: Other abnormalities of gait and mobility (R26.89);Muscle weakness (generalized) (M62.81)     Time: 0865-7846 PT Time Calculation (min) (ACUTE ONLY): 20 min  Charges:  $Gait Training: 8-22 mins $Therapeutic Activity: 8-22 mins                     Dula Havlik P., PTA Acute Rehabilitation Services Pager: [phone] Office: [phone]    Angus Palms 01/02/2021, 2:42 PM

## 2021-01-03 ENCOUNTER — Ambulatory Visit: Payer: Medicaid Other | Admitting: Podiatry

## 2021-01-03 LAB — GLUCOSE, CAPILLARY
Glucose-Capillary: 164 mg/dL — ABNORMAL HIGH (ref 70–99)
Glucose-Capillary: 211 mg/dL — ABNORMAL HIGH (ref 70–99)
Glucose-Capillary: 163 mg/dL — ABNORMAL HIGH (ref 70–99)
Glucose-Capillary: 94 mg/dL (ref 70–99)

## 2021-01-03 MED ORDER — MIDODRINE HCL 5 MG PO TABS
5.0000 mg | ORAL_TABLET | Freq: Three times a day (TID) | ORAL | Status: DC
  Administered 2021-01-03 – 2021-01-04 (×2): 5 mg via ORAL
  Filled 2021-01-03 (×2): qty 1

## 2021-01-03 MED ORDER — SODIUM CHLORIDE 0.9 % IV BOLUS
500.0000 mL | Freq: Once | INTRAVENOUS | Status: AC
  Administered 2021-01-03: 500 mL via INTRAVENOUS

## 2021-01-03 MED FILL — Lidocaine HCl Local Preservative Free (PF) Inj 2%: INTRAMUSCULAR | Qty: 15 | Status: AC

## 2021-01-03 MED FILL — Sodium Chloride IV Soln 0.9%: INTRAVENOUS | Qty: 3000 | Status: AC

## 2021-01-03 MED FILL — Mannitol IV Soln 20%: INTRAVENOUS | Qty: 500 | Status: AC

## 2021-01-03 MED FILL — Sodium Bicarbonate IV Soln 8.4%: INTRAVENOUS | Qty: 50 | Status: AC

## 2021-01-03 MED FILL — Calcium Chloride Inj 10%: INTRAVENOUS | Qty: 10 | Status: AC

## 2021-01-03 MED FILL — Heparin Sodium (Porcine) Inj 1000 Unit/ML: INTRAMUSCULAR | Qty: 30 | Status: AC

## 2021-01-03 MED FILL — Albumin, Human Inj 5%: INTRAVENOUS | Qty: 250 | Status: AC

## 2021-01-03 MED FILL — Heparin Sodium (Porcine) Inj 1000 Unit/ML: INTRAMUSCULAR | Qty: 20 | Status: AC

## 2021-01-03 NOTE — Progress Notes (Signed)
Pts had orthostatic hypotension after amb to bathroom. PA notified and orders were placed.   Brooke Pace, RN

## 2021-01-03 NOTE — TOC Progression Note (Addendum)
Transition of Care [facility]) - Progression Note    Patient Details  Name: [name] MRN: [id number] Date of Birth: May 02, 1966  [facility]) CM/SW Contact  Leone Haven, RN Phone Number: 01/03/2021, 4:08 PM  Clinical Narrative:    NCM spoke with patient at bedside , offered choice, he states he does not have a preference.  NCM made referral to [facility], awaiting call back, for HHRN,HHPT,HHOT.  NCM asked patient is he wanted rolling walker and shower stool.  He states he does but he is not sure if his wife has one at home, he is ok with [employer] supplying this for him.  NCM made referral to [facility] with [facility] for rolling walker an shower stool. Dondra Spry with [facility] states they can not take referral.  [facility] decline, [facility] is not in network,, [facility] not in network, [facility] not in network,  [facility] declined, [facility] does not cover in this area, [facility] is checking to see if they can take referral, awaiting call back. [facility] can not take referral.  NCM spoke with patient regarding results of these referrals.  Asked where would he like to do his outpatient physical therapy , he states he will need to ask his wife.    Expected Discharge Plan: Home w Home Health Services Barriers to Discharge: Continued Medical Work up  Expected Discharge Plan and Services Expected Discharge Plan: Home w Home Health Services   Discharge Planning Services: CM Consult   Living arrangements for the past 2 months: Single Family Home                                       Social Determinants of Health (SDOH) Interventions    Readmission Risk Interventions No flowsheet data found.

## 2021-01-03 NOTE — Progress Notes (Addendum)
     [facility] [address]       [place] [address]             [phone]      7 Days Post-Op Procedure(s) (LRB): CORONARY ARTERY BYPASS GRAFTING (CABG) X FOUR ON PUMP USING LEFT INTERNAL MAMMARY ARTERY AND RIGHT ENDOSCOPIC GREATER SAPHENOUS VEIN CONDUITS (N/A) TRANSESOPHAGEAL ECHOCARDIOGRAM (TEE) (N/A) APPLICATION OF CELL SAVER Subjective:  Awakened from sleep, slow to respond but MS appropriate.  Hypotensive at midnight, MAP 57.  Given NS . BP now 130's. No new complaints, appetite and oral intake still poor.   Objective: Vital signs in last 24 hours: Temp:  [97.5 F (36.4 C)-98.3 F (36.8 C)] 98.1 F (36.7 C) (07/21 0700) Pulse Rate:  [100-113] 100 (07/21 0700) Cardiac Rhythm: Sinus tachycardia (07/20 2025) Resp:  [16-20] 18 (07/21 0700) BP: (87-137)/(43-75) 137/75 (07/21 0700) SpO2:  [94 %-100 %] 98 % (07/21 0700) Weight:  [58.4 kg] 58.4 kg (07/21 0631)    Intake/Output from previous day: 07/20 0701 - 07/21 0700 In: 501.5 [IV Piggyback:501.5] Out: 2729 [Urine:2729] Intake/Output this shift: No intake/output data recorded.  General appearance: weak, sleepy, cooperative, no apparent distress Neurologic: intact Heart: SR, ST. Lungs: breath sounds are clear, maintaining sats on RA.  Abdomen: soft, NT.  Extremities: no edema. Wound: incisions intact and dry.   Lab Results: Recent Labs    01/01/21 0053  WBC 6.3  HGB 9.6*  HCT 27.7*  PLT 227    BMET:  Recent Labs    01/01/21 0053 01/02/21 0344  NA 137 137  K 3.9 4.0  CL 108 104  CO2 25 21*  GLUCOSE 222* 98  BUN 19 14  CREATININE 0.94 0.84  CALCIUM 8.9 9.3     PT/INR: No results for input(s): LABPROT, INR in the last 72 hours. ABG    Component Value Date/Time   PHART 7.485 (H) 12/28/2020 0433   HCO3 16.8 (L) 12/28/2020 0433   TCO2 17 (L) 12/28/2020 0433   ACIDBASEDEF 6.0 (H) 12/28/2020 0433   O2SAT 98.0 12/28/2020 0433   CBG (last 3)   Recent Labs    01/02/21 1621 01/02/21 2128 01/03/21 0625  GLUCAP 231* 206* 164*     Assessment/Plan: S/P Procedure(s) (LRB): CORONARY ARTERY BYPASS GRAFTING (CABG) X FOUR ON PUMP USING LEFT INTERNAL MAMMARY ARTERY AND RIGHT ENDOSCOPIC GREATER SAPHENOUS VEIN CONDUITS (N/A) TRANSESOPHAGEAL ECHOCARDIOGRAM (TEE) (N/A) APPLICATION OF CELL SAVER  -POD-7 CABG x 4, normal EF pre-op.  Progressing slowly with mobility. BP low last night but corrected with NS bolus. Suspect this is primarily due to dehydration as he is 11L net negative for the admission.  He has been off Lasix for 3 days. Continue Midodrine. On ASA, low-dose metoprolol, statin.    -Type 2 DM- pre-op A1C 9.9. Glucose control reasonable, continue SSI AC/HS.   -Expected acute blood loss anemia- tolerating well and has been stable. No new lab today, continue to supplement Fe++.  -History of anxiety--on Buspar and Cymbalta  -History of migraine- on Topamax 25mg  BID  -DVT PPX- on daily SQ enoxaparin.   -Disposition- Planning eventual discharge to home but he is only ambulating once daily and has poor PO intake.    LOS: 10 days    Parke Poisson 01/03/2021  Patient seen and examined, agree with above  01/05/2021 C. Viviann Spare, MD [facility] [phone]

## 2021-01-03 NOTE — Progress Notes (Signed)
Spoke with patient and wife at the bedside. Patient takes Basaglar 12-15 units daily, Metformin, and Glipizide at home. States that he gives his insulin in his abdomen most of the time. Encouraged him to move injection sites around and use outer thighs as well. Wife states that he just started on Basaglar about 3 months ago after being off insulin (Levemir) for 3 years. He could not afford the insulin prior to getting Medicaid.   His A1C is 9.9% now which is down from 13% prior to this reading. States that he checks his blood sugars at least once a day. He last saw his PCP before his surgery and will follow up after discharge and every 3 months. States that his sense of taste is gone since the surgery, but hopes that it will get better.   Patient has the Living Well with Diabetes booklet.   Will continue to monitor blood sugars while in the hospital.  Smith Mince RN BSN CDE Diabetes Coordinator Pager: [phone]  8am-5pm

## 2021-01-03 NOTE — Progress Notes (Signed)
CARDIAC REHAB PHASE I   PRE:  Rate/Rhythm: 101 ST  BP:  Sitting: 81/61      SaO2: 96 RA   Went to offer to walk with pt. Pt hypotensive, denies dizziness or SOB at this time. RN made aware. Will hold ambulation at this time.  [id number] Reynold Bowen, RN BSN 01/03/2021 11:18 AM

## 2021-01-03 NOTE — Progress Notes (Signed)
Physical Therapy Treatment Patient Details Name: [name] MRN: [id number] DOB: 09-24-1965 Today's Date: 01/03/2021    History of Present Illness Pt is a 55 y.o. male admitted 12/24/20 for heart cath; found to have severe three-vessel CAD. 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 PMH includes DM2, CAD, HTN, ataxia, anxiety.    PT Comments    Pt received in supine, agreeable to therapy session and more alert/talkative and oriented this date, with improved information recall but continues to need reinforcement for compliance with sternal precautions. Pt Supervision for mobility tasks this session with RW but unable to progress gait/stair training due to orthostatic BP (pt denies symptoms but BP 76/48 (57) standing at bedside and noted pale pallor, see full set of vitals below. Focus instead on seated/supine LE exercises for strengthening and improved hemodynamics. Discussed with RN we had recommended TED hose previous date but none present in room, per RN these have been ordered but haven't arrived to room yet, Diplomatic Services operational officer notified to reorder. Pt continues to benefit from PT services to progress toward functional mobility goals.   Follow Up Recommendations  Home health PT;Supervision/Assistance - 24 hour     Equipment Recommendations  Rolling walker with 5" wheels;3in1 (PT)    Recommendations for Other Services       Precautions / Restrictions Precautions Precautions: Fall;Sternal Precaution Booklet Issued: Yes (comment) Precaution Comments: previously given, pt able to state "I'm not supposed to push" discussed Move in the Tube precautions with him Restrictions Weight Bearing Restrictions: No Other Position/Activity Restrictions: sternal precs    Mobility  Bed Mobility Overal bed mobility: Needs Assistance Bed Mobility: Rolling;Sidelying to Sit;Sit to Sidelying Rolling: Modified independent (Device/Increase time) Sidelying to sit: Supervision     Sit to sidelying: Supervision General bed  mobility comments: Cues for sternal precautions, increased time to perform    Transfers Overall transfer level: Needs assistance Equipment used: Rolling walker (2 wheeled) Transfers: Sit to/from Stand Sit to Stand: Supervision         General transfer comment: Cues for sequencing to maintain sternal precautions as pt attempting to pull up on walker; did not need lift assist this date from EOB  Ambulation/Gait             General Gait Details: deferred due to low BP MAP (57) standing at bedside         Balance Overall balance assessment: Needs assistance   Sitting balance-Leahy Scale: Fair Sitting balance - Comments: static sitting no LOB unsupported   Standing balance support: Bilateral upper extremity supported Standing balance-Leahy Scale: Fair Standing balance comment: Can static stand without UE support, unable to accept challenge; static and dynamic stability improved with RW                            Cognition Arousal/Alertness: Awake/alert Behavior During Therapy: Flat affect Overall Cognitive Status: Impaired/Different from baseline Area of Impairment: Memory;Following commands;Safety/judgement;Awareness;Problem solving                   Current Attention Level: Selective Memory: Decreased recall of precautions;Decreased short-term memory Following Commands: Follows one step commands with increased time;Follows one step commands consistently Safety/Judgement: Decreased awareness of safety;Decreased awareness of deficits Awareness: Emergent Problem Solving: Slow processing;Difficulty sequencing;Requires verbal cues General Comments: Pt with improved engagement with session this date, able to answer PTA's questions and somewhat slow processing at times but participatory as able, denies orthostatic symptoms but agreeable to eat/drink and some recall of sternal  precautions although needs reinforcement with all mobility tasks this date.       Exercises Other Exercises Other Exercises: supine BLE AROM: heel slides, hip abduction, ankle pumps x10 reps ea Other Exercises: seated BLE AROM: LAQ, hip flexion x10 reps ea Other Exercises: IS x3 reps (~750-800 mL)    General Comments General comments (skin integrity, edema, etc.): BP 99/78 (85) seated, BP 76/48 (57) standing, asymptomatic; BP 83/47 (58) seated after standing; BP 115/60 (73) supine after LE exercises; HR 98 seated and 113 bpm standing      Pertinent Vitals/Pain Pain Assessment: Faces Faces Pain Scale: Hurts a little bit Pain Location: R groin/bruising, sternal incision Pain Descriptors / Indicators: Discomfort;Sore Pain Intervention(s): Monitored during session;Repositioned     PT Goals (current goals can now be found in the care plan section) Acute Rehab PT Goals Patient Stated Goal: to go home Progress towards PT goals: Progressing toward goals    Frequency    Min 3X/week      PT Plan Current plan remains appropriate    Co-evaluation              AM-PAC PT "6 Clicks" Mobility   Outcome Measure  Help needed turning from your back to your side while in a flat bed without using bedrails?: None Help needed moving from lying on your back to sitting on the side of a flat bed without using bedrails?: A Little Help needed moving to and from a bed to a chair (including a wheelchair)?: A Little Help needed standing up from a chair using your arms (e.g., wheelchair or bedside chair)?: A Little Help needed to walk in hospital room?: A Little Help needed climbing 3-5 steps with a railing? : A Little 6 Click Score: 19    End of Session Equipment Utilized During Treatment: Gait belt Activity Tolerance: Other (comment);Treatment limited secondary to medical complications (Comment);Patient tolerated treatment well (Orthostatic symptoms limiting mobility, no TED hose in room at time of session) Patient left: in bed;with call bell/phone within reach;with bed  alarm set;Other (comment);with nursing/sitter in room (tele sitter present) Nurse Communication: Mobility status;Other (comment) (orthostatic hypotension, needs TED hose (not in room yet, per RN they have been ordered)) PT Visit Diagnosis: Other abnormalities of gait and mobility (R26.89);Muscle weakness (generalized) (M62.81)     Time: 9937-1696 PT Time Calculation (min) (ACUTE ONLY): 33 min  Charges:  $Therapeutic Exercise: 8-22 mins $Therapeutic Activity: 8-22 mins                     Melek Pownall P., PTA Acute Rehabilitation Services Pager: [phone] Office: [phone]    Angus Palms 01/03/2021, 6:19 PM

## 2021-01-03 NOTE — Progress Notes (Signed)
Mobility Specialist: Progress Note   01/03/21 1641  Mobility  Activity Ambulated in hall  Level of Assistance Contact guard assist, steadying assist  Assistive Device Front wheel walker  Distance Ambulated (ft) 90 ft  Mobility Ambulated with assistance in hallway  Mobility Response Tolerated well  Mobility performed by Mobility specialist  $Mobility charge 1 Mobility   Pre-Mobility: 106 HR, 80/68 BP Post-Mobility:            Initial: 100 HR, 71/53 BP, 100% SpO2           After 2 minutes: 100/59 BP  Pt on BSC upon entering room with NT present. Pt agreeable to ambulation. Pt c/o feeling dizzy halfway through ambulation so had pt return to room. Pt back to bed after walk with call bell at his side and bed alarm on.   [facility] Kayla Weekes Mobility Specialist Mobility Specialist Phone: [phone]

## 2021-01-03 NOTE — Progress Notes (Signed)
CARDIAC REHAB PHASE I   PRE:  Rate/Rhythm: 94 SR  BP:  Sitting: 110/65      SaO2: 95 RA  MODE:  Ambulation: 200 ft   POST:  Rate/Rhythm: 110 ST  BP:  Sitting: 85/55 --> 117/68    SaO2: 97 RA   Pt helped out of BR and ambulated 266ft in hallway. Pt denies CP, SOB, or dizziness throughout walk. Pt returned to bed. Bed alarm on, call bell and bedside table within reach. Will continue to follow and encourage continued ambulation.  [id number] Reynold Bowen, RN BSN 01/03/2021 1:55 PM

## 2021-01-03 NOTE — Progress Notes (Signed)
Wife ambulated pt in room again after being educated on our fall risk protocol. Checked pts BP afterwards and educated wife again on the importance of calling out for help d/t pts hypotension. Bed alarm reset.  Brooke Pace, RN

## 2021-01-03 NOTE — Progress Notes (Signed)
Notified on-call physician regarding yellow mews score for low blood pressure. Orders given. Will continue to monitor.

## 2021-01-03 NOTE — Progress Notes (Signed)
Met w/ Wife; explained our high fall risk protocol and wanting to keep pt safe. Wife stated the CT PA encouraged her to get the pt up and walk in room w/ assist.  Provided education to let pt sit on edge of bed first, stand first and be sure pt not dizzy with ambulation; walker use encouraged. Pt wife insisting has been 'dealing with this before this surgery"; encouraged her to call for assistance. Reiterated safety of the pt and reducing the risk of fall is our goal; discussed pt attempting to Get OOB when no one in room thus the bed alarm. She stated pt better mentally and not needed. Explained our goal to keep pt safe. --JM

## 2021-01-04 LAB — CREATININE, SERUM
Creatinine, Ser: 0.94 mg/dL (ref 0.61–1.24)
GFR, Estimated: >60 mL/min (ref >60)

## 2021-01-04 LAB — GLUCOSE, CAPILLARY
Glucose-Capillary: 169 mg/dL — ABNORMAL HIGH (ref 70–99)
Glucose-Capillary: 243 mg/dL — ABNORMAL HIGH (ref 70–99)
Glucose-Capillary: 224 mg/dL — ABNORMAL HIGH (ref 70–99)
Glucose-Capillary: 225 mg/dL — ABNORMAL HIGH (ref 70–99)

## 2021-01-04 MED ORDER — MIDODRINE HCL 5 MG PO TABS
10.0000 mg | ORAL_TABLET | Freq: Three times a day (TID) | ORAL | Status: DC
Start: 2021-01-04 — End: 2021-01-05
  Administered 2021-01-04 – 2021-01-05 (×4): 10 mg via ORAL
  Filled 2021-01-04 (×4): qty 2

## 2021-01-04 NOTE — Progress Notes (Signed)
Physical Therapy Treatment Patient Details Name: [name] MRN: [id number] DOB: [birth year] Today's Date: 01/04/2021    History of Present Illness Pt is a 55 y.o. male admitted 12/24/20 for heart cath; found to have severe three-vessel CAD. 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 PMH includes DM2, CAD, HTN, ataxia, anxiety.    PT Comments    Pt received in supine, spouse in room and encouraging, pt with TED hose donned and with good participation and tolerance for gait and stair training. Pt performed LE therapeutic exercises for warm-up and strengthening but continues to demonstrate low blood pressures with transfers although asymptomatic this date. Kept pt close to bedside for safety and pt able to progress to Supervision level for transfers/gait and needs up to min guard for stair training in room. Orthostatic BPs  Supine 99/60 (72)   Sitting 81/48 (58) asymptomatic  Standing 78/48 (57)  asymptomatic  Standing after 3 min 91/51 (58) asymptomatic  Supine post-exertion 95/61 (72)  Pt continues to benefit from PT services to progress toward functional mobility goals. Continue to recommend HHPT.   Follow Up Recommendations  Home health PT;Supervision/Assistance - 24 hour     Equipment Recommendations  Rolling walker with 5" wheels;3in1 (PT)    Recommendations for Other Services       Precautions / Restrictions Precautions Precautions: Fall;Sternal Precaution Booklet Issued: Yes (comment) Precaution Comments: good compliance with sternal precaution this date Restrictions Weight Bearing Restrictions: No RUE Weight Bearing: Non weight bearing LUE Weight Bearing: Non weight bearing Other Position/Activity Restrictions: sternal precautions    Mobility  Bed Mobility Overal bed mobility: Needs Assistance   Rolling: Modified independent (Device/Increase time) Sidelying to sit: Supervision   Sit to supine: Supervision Sit to sidelying: Supervision General bed mobility comments: Cues for  sternal precautions initially with good carryover this date; pillow for splinting    Transfers Overall transfer level: Needs assistance Equipment used: None Transfers: Sit to/from Stand Sit to Stand: Supervision         General transfer comment: from EOB x 7 total reps, good recall of precautions, pt preferring to splint with heart pillow while performing, no physical assist needed to rise  Ambulation/Gait Ambulation/Gait assistance: Supervision Gait Distance (Feet): 60 Feet Assistive device: Rolling walker (2 wheeled) Gait Pattern/deviations: Step-through pattern;Decreased stride length     General Gait Details: pt asymptomatic despite soft BP so kept to short distances in room per symptoms, BP MAP (58) pre/post gait but improves to (72) once supine; no buckling or LOB using RW and steady throughout   Stairs Stairs: Yes Stairs assistance: Min guard Stair Management: Two rails;Forwards;Step to pattern Number of Stairs: 5 General stair comments: using RW to simulate B handrails, no dizziness or LOB, cues for safety   Wheelchair Mobility    Modified Rankin (Stroke Patients Only)       Balance Overall balance assessment: Needs assistance Sitting-balance support: No upper extremity supported;Feet supported Sitting balance-Leahy Scale: Good     Standing balance support: Bilateral upper extremity supported;Single extremity supported Standing balance-Leahy Scale: Fair Standing balance comment: can static stand without UE support, light UE reliance on RW only, maintained RW for safety due to soft BP readings although pt asymptomatic                            Cognition Arousal/Alertness: Awake/alert Behavior During Therapy: WFL for tasks assessed/performed Overall Cognitive Status: Within Functional Limits for tasks assessed Area of Impairment: Problem solving  Memory: Decreased recall of precautions Following Commands: Follows  one step commands with increased time;Follows one step commands consistently Safety/Judgement: Decreased awareness of safety;Decreased awareness of deficits   Problem Solving: Slow processing;Requires verbal cues General Comments: pt alert, improved cognition today, fair recall of sternal precautions; good participation as able.      Exercises General Exercises - Lower Extremity Ankle Circles/Pumps: AROM;Both;20 reps;Supine Long Arc Quad: AROM;Both;10 reps;Seated Heel Slides: AROM;Both;10 reps;Supine Straight Leg Raises: AROM;Both;10 reps;Supine Hip Flexion/Marching: AROM;Both;10 reps;Standing    General Comments General comments (skin integrity, edema, etc.): see BP above; no dizziness reported      Pertinent Vitals/Pain Pain Assessment: 0-10 Pain Score: 5  Faces Pain Scale: Hurts a little bit Pain Location: sternal incision Pain Descriptors / Indicators: Discomfort;Sore Pain Intervention(s): Limited activity within patient's tolerance;Monitored during session;Repositioned (pt manually splinting with pillow each transfer)    Home Living                      Prior Function            PT Goals (current goals can now be found in the care plan section) Acute Rehab PT Goals Patient Stated Goal: to go home Progress towards PT goals: Progressing toward goals    Frequency    Min 3X/week      PT Plan Current plan remains appropriate    Co-evaluation              AM-PAC PT "6 Clicks" Mobility   Outcome Measure  Help needed turning from your back to your side while in a flat bed without using bedrails?: None Help needed moving from lying on your back to sitting on the side of a flat bed without using bedrails?: A Little Help needed moving to and from a bed to a chair (including a wheelchair)?: A Little Help needed standing up from a chair using your arms (e.g., wheelchair or bedside chair)?: A Little Help needed to walk in hospital room?: A Little Help  needed climbing 3-5 steps with a railing? : A Little 6 Click Score: 19    End of Session Equipment Utilized During Treatment: Gait belt Activity Tolerance: Patient tolerated treatment well;Other (comment) (soft BP but asymptomatic; deferred hallway gait due to soft BP) Patient left: in bed;with call bell/phone within reach;with bed alarm set;with family/visitor present;with nursing/sitter in room;Other (comment) (telesitter in room, bed in chair position) Nurse Communication: Mobility status;Other (comment) (pt continues to have soft BP but asymptomatic) PT Visit Diagnosis: Other abnormalities of gait and mobility (R26.89);Muscle weakness (generalized) (M62.81)     Time: 9563-8756 PT Time Calculation (min) (ACUTE ONLY): 24 min  Charges:  $Gait Training: 8-22 mins $Therapeutic Exercise: 8-22 mins                     Oceana Walthall P., PTA Acute Rehabilitation Services Pager: [phone] Office: [phone]    Angus Palms 01/04/2021, 4:24 PM

## 2021-01-04 NOTE — TOC Progression Note (Addendum)
Transition of Care [facility]) - Progression Note    Patient Details  Name: [name] MRN: [id number] Date of Birth: 01-22-1966  [facility]) CM/SW Contact  Leone Haven, RN Phone Number: 01/04/2021, 3:02 PM  Clinical Narrative:    NCM spoke with wife in the room she would like for patient to go to [facility] for outpatient pt.  Will need a script to be signed. Also she would like a 3 n 1.  NCM made referral to [facility] with [facility] and the 3 n 1 will bring to the room today and wife will take the DME home today.  [place] PA will sign the script for outpatient pt.  Script was faxed to [phone].    Expected Discharge Plan: Home w Home Health Services Barriers to Discharge: Continued Medical Work up  Expected Discharge Plan and Services Expected Discharge Plan: Home w Home Health Services   Discharge Planning Services: CM Consult   Living arrangements for the past 2 months: Single Family Home                                       Social Determinants of Health (SDOH) Interventions    Readmission Risk Interventions No flowsheet data found.

## 2021-01-04 NOTE — TOC Transition Note (Addendum)
Transition of Care [facility]) - CM/SW Discharge Note   Patient Details  Name: [name] MRN: [id number] Date of Birth: May 02, 1966  [facility]) CM/SW Contact:  Leone Haven, RN Phone Number: 01/04/2021, 3:31 PM   Clinical Narrative:    Patient and wife has decided to do outpatient physical therapy at [facility] out patient, fax [phone].  Wife states her phone is [phone].  Wife will take the DME home today. If patient BP is ok tomorrow then he may possible go home tomorrow per [place] PA.  Wife has the hard script for the outpatient pt.   Final next level of care: Home/Self Care Barriers to Discharge: Continued Medical Work up   Patient Goals and CMS Choice Patient states their goals for this hospitalization and ongoing recovery are:: return home with outpatient pt CMS Medicare.gov Compare Post Acute Care list provided to:: Patient Choice offered to / list presented to : NA  Discharge Placement                       Discharge Plan and Services   Discharge Planning Services: CM Consult            DME Arranged: 3-N-1, Walker rolling, Shower stool DME Agency: [employer] Date DME Agency Contacted: 01/04/21 Time DME Agency Contacted: [phone] Representative spoke with at DME Agency: Silvio Pate HH Arranged: NA          Social Determinants of Health (SDOH) Interventions     Readmission Risk Interventions No flowsheet data found.

## 2021-01-04 NOTE — Consult Note (Signed)
   [facility] Inpatient Consult   01/04/2021  Kamonte Mcmichen 1965/08/03 [id number]  Managed Medicaid:  [employer]  Primary Care Provider:  Joice Lofts, NP At [facility] [place] noted  Reviewed for length of stay and for  post hospital needs for transition reveals patient was for HHPT per PT evaluation ambulating with Cardiac Rehab team noted. Patient is s/p CABG x4. Plan: Spoke with inpatient [facility] RN regarding home health verse OP rehab needs.  Will request referral for Managed Medicaid team for chronic care management to see if he is in network.  For questions,  Charlesetta Shanks, RN BSN CCM [facility]  [phone] business mobile phone Toll free office [phone]  Fax number: [phone] [email or web address]

## 2021-01-04 NOTE — Progress Notes (Addendum)
Occupational Therapy Treatment Patient Details Name: [name] MRN: [id number] DOB: 12/23/1965 Today's Date: 01/04/2021    History of present illness Pt is a 55 y.o. male admitted 12/24/20 for heart cath; found to have severe three-vessel CAD. 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 PMH includes DM2, CAD, HTN, ataxia, anxiety.   OT comments  Pt making good progress with functional goals. Pt in recliner upon arrival; pt alert and with improved cognition today. Session focused on sit - stand from recliner to RW with cues for sternal precautions, functional mobility to bathroom for toilet transfers, toileting tasks, shower transfer, LB bathing (simulated), LB dressing, standing at sink for grooming/hygiene tasks, review pf sternal precautions. BSC and RW have been delivered to pt's room.  BP sitting before activity 109/64 BP with/after activity to bathroom 89/60  Follow Up Recommendations  Home health OT;Supervision - Intermittent    Equipment Recommendations  Tub/shower seat    Recommendations for Other Services      Precautions / Restrictions Precautions Precautions: Fall;Sternal Precaution Booklet Issued: Yes (comment) Precaution Comments: reviewed sternal precautions, sit - stand properly Restrictions Weight Bearing Restrictions: Yes RUE Weight Bearing: Non weight bearing LUE Weight Bearing: Non weight bearing Other Position/Activity Restrictions: sternal precautions       Mobility Bed Mobility Overal bed mobility: Needs Assistance         Sit to supine: Supervision Sit to sidelying: Supervision General bed mobility comments: Cues for sternal precautions    Transfers Overall transfer level: Needs assistance Equipment used: Rolling walker (2 wheeled)   Sit to Stand: Supervision         General transfer comment: Cues for sequencing to maintain sternal precautions as pt attempting to pull up on walker from recliner and toilet    Balance Overall balance assessment: Needs  assistance Sitting-balance support: No upper extremity supported;Feet supported Sitting balance-Leahy Scale: Good     Standing balance support: Bilateral upper extremity supported;Single extremity supported                               ADL either performed or assessed with clinical judgement   ADL Overall ADL's : Needs assistance/impaired     Grooming: Wash/dry hands;Wash/dry face;Standing;Supervision/safety;Brushing hair       Lower Body Bathing: Min guard;Sit to/from stand Lower Body Bathing Details (indicate cue type and reason): simulated standing in shower     Lower Body Dressing: Min guard;Sit to/from stand Lower Body Dressing Details (indicate cue type and reason): doffed/donned pajama pants Toilet Transfer: Ambulation;RW;Comfort height toilet;Cueing for safety;Supervision/safety   Toileting- Clothing Manipulation and Hygiene: Supervision/safety;Sit to/from stand       Functional mobility during ADLs: Rolling walker;Cueing for safety;Cueing for sequencing;Supervision/safety       Vision Baseline Vision/History: Wears glasses Wears Glasses: Reading only Patient Visual Report: No change from baseline     Perception     Praxis      Cognition Arousal/Alertness: Awake/alert   Overall Cognitive Status: Impaired/Different from baseline Area of Impairment: Safety/judgement;Problem solving                     Memory: Decreased recall of precautions Following Commands: Follows one step commands with increased time;Follows one step commands consistently Safety/Judgement: Decreased awareness of safety;Decreased awareness of deficits   Problem Solving: Slow processing;Difficulty sequencing;Requires verbal cues General Comments: pt alert, improved cognition today, soe recall of sternal precautions        Exercises  Shoulder Instructions       General Comments      Pertinent Vitals/ Pain       Pain Assessment: Faces Faces Pain  Scale: Hurts a little bit Pain Location: sternal incision Pain Descriptors / Indicators: Discomfort;Sore Pain Intervention(s): Monitored during session;Repositioned  Home Living                                          Prior Functioning/Environment              Frequency  Min 2X/week        Progress Toward Goals  OT Goals(current goals can now be found in the care plan section)  Progress towards OT goals: Progressing toward goals  Acute Rehab OT Goals Patient Stated Goal: to go home  Plan Discharge plan remains appropriate    Co-evaluation                 AM-PAC OT "6 Clicks" Daily Activity     Outcome Measure   Help from another person eating meals?: None Help from another person taking care of personal grooming?: A Little Help from another person toileting, which includes using toliet, bedpan, or urinal?: A Little Help from another person bathing (including washing, rinsing, drying)?: A Little Help from another person to put on and taking off regular upper body clothing?: None Help from another person to put on and taking off regular lower body clothing?: A Little 6 Click Score: 20    End of Session Equipment Utilized During Treatment: Gait belt;Rolling walker  OT Visit Diagnosis: Unsteadiness on feet (R26.81);Other abnormalities of gait and mobility (R26.89);Muscle weakness (generalized) (M62.81);Other symptoms and signs involving cognitive function   Activity Tolerance Patient tolerated treatment well   Patient Left in bed;with call bell/phone within reach;with bed alarm set   Nurse Communication          Time: 8413-2440 OT Time Calculation (min): 19 min  Charges: OT General Charges $OT Visit: 1 Visit OT Treatments $Self Care/Home Management : 8-22 mins     Galen Manila 01/04/2021, 12:34 PM

## 2021-01-04 NOTE — Progress Notes (Signed)
CARDIAC REHAB PHASE I   PRE:  Rate/Rhythm: 108 ST  BP:  Sitting: 100/56      SaO2: 98 RA  MODE:  Ambulation: 300 ft   POST:  Rate/Rhythm: 126 ST  BP:  Sitting: 85/59 --> 88/57    SaO2: 97 RA   Pt up in chair today. Happy he could put pants on today, but disappointed he can't go home. Pt ambulated 323ft with front wheel walker and gait belt, improved endurance today. Pt returned to recliner. Encouraged continued IS use and ambulation. Call bell and bedside table within reach. Will continue to follow.  [id number] Reynold Bowen, RN BSN 01/04/2021 9:46 AM

## 2021-01-04 NOTE — Progress Notes (Addendum)
      [address]       [place] [address]             [phone]      8 Days Post-Op Procedure(s) (LRB): CORONARY ARTERY BYPASS GRAFTING (CABG) X FOUR ON PUMP USING LEFT INTERNAL MAMMARY ARTERY AND RIGHT ENDOSCOPIC GREATER SAPHENOUS VEIN CONDUITS (N/A) TRANSESOPHAGEAL ECHOCARDIOGRAM (TEE) (N/A) APPLICATION OF CELL SAVER Subjective: Feels okay this morning, no complaints.   Objective: Vital signs in last 24 hours: Temp:  [97.7 F (36.5 C)-98.3 F (36.8 C)] 98.3 F (36.8 C) (07/22 0559) Pulse Rate:  [96-104] 100 (07/22 0559) Cardiac Rhythm: Sinus tachycardia (07/21 2001) Resp:  [11-20] 14 (07/22 0559) BP: (81-120)/(47-65) 101/65 (07/22 0559) SpO2:  [96 %-100 %] 99 % (07/22 0559)     Intake/Output from previous day: 07/21 0701 - 07/22 0700 In: 120 [P.O.:120] Out: 404 [Urine:400; Stool:4] Intake/Output this shift: No intake/output data recorded.  General appearance: alert, cooperative, and no distress Heart: regular rate and rhythm, S1, S2 normal, no murmur, click, rub or gallop Lungs: clear to auscultation bilaterally Abdomen: soft, non-tender; bowel sounds normal; no masses,  no organomegaly Extremities: extremities normal, atraumatic, no cyanosis or edema Wound: clean and dry  Lab Results: No results for input(s): WBC, HGB, HCT, PLT in the last 72 hours. BMET:  Recent Labs    01/02/21 0344 01/04/21 0623  NA 137  --   K 4.0  --   CL 104  --   CO2 21*  --   GLUCOSE 98  --   BUN 14  --   CREATININE 0.84 0.94  CALCIUM 9.3  --     PT/INR: No results for input(s): LABPROT, INR in the last 72 hours. ABG    Component Value Date/Time   PHART 7.485 (H) 12/28/2020 0433   HCO3 16.8 (L) 12/28/2020 0433   TCO2 17 (L) 12/28/2020 0433   ACIDBASEDEF 6.0 (H) 12/28/2020 0433   O2SAT 98.0 12/28/2020 0433   CBG (last 3)  Recent Labs    01/03/21 1557 01/03/21 2126 01/04/21 0601  GLUCAP 163* 94 169*    Assessment/Plan: S/P Procedure(s)  (LRB): CORONARY ARTERY BYPASS GRAFTING (CABG) X FOUR ON PUMP USING LEFT INTERNAL MAMMARY ARTERY AND RIGHT ENDOSCOPIC GREATER SAPHENOUS VEIN CONDUITS (N/A) TRANSESOPHAGEAL ECHOCARDIOGRAM (TEE) (N/A) APPLICATION OF CELL SAVER   -POD-8 CABG x 4, normal EF pre-op.    CV- Progressing slowly with mobility. On ASA, low-dose metoprolol, statin.  Sinus tachycardia.  Type 2 DM- pre-op A1C 9.9. Glucose control reasonable, continue SSI AC/HS. Expected acute blood loss anemia-  continue to supplement Fe++. Creatinine 0.94, no other labs History of anxiety--on Buspar and Cymbalta History of migraine- on Topamax 25mg  BID DVT PPX- on daily SQ enoxaparin.   Disposition- Planning eventual discharge to home but needs to ambulate more and have better pain control. He is asking for a bedside commode or a toilet to go over his existing toilet.    LOS: 11 days    01/04/2021   Chart reviewed, patient examined, agree with above. He was still orthostatic to 76/48 yesterday with PT although asymptomatic. I increased midodrine to 10 tid. He is on low dose Lopressor for resting tachycardia. He really wants to go home with his wife but I think he needs to walking safely without orthostasis so that he does not fall.

## 2021-01-05 LAB — GLUCOSE, CAPILLARY
Glucose-Capillary: 180 mg/dL — ABNORMAL HIGH (ref 70–99)
Glucose-Capillary: 373 mg/dL — ABNORMAL HIGH (ref 70–99)

## 2021-01-05 MED ORDER — METOPROLOL TARTRATE 25 MG PO TABS
12.5000 mg | ORAL_TABLET | Freq: Two times a day (BID) | ORAL | 2 refills | Status: DC
Start: 2021-01-05 — End: 2021-01-17

## 2021-01-05 MED ORDER — TRAMADOL HCL 50 MG PO TABS: 50.0000 mg | ORAL_TABLET | Freq: Four times a day (QID) | ORAL | 0 refills | Status: DC | PRN

## 2021-01-05 MED ORDER — MIDODRINE HCL 10 MG PO TABS: 10.0000 mg | ORAL_TABLET | Freq: Three times a day (TID) | ORAL | 1 refills | Status: DC

## 2021-01-05 MED ORDER — INSULIN GLARGINE 100 UNIT/ML ~~LOC~~ SOLN
10.0000 [IU] | Freq: Every day | SUBCUTANEOUS | Status: DC
  Administered 2021-01-05: 10 [IU] via SUBCUTANEOUS
  Filled 2021-01-05 (×2): qty 0.1

## 2021-01-05 NOTE — Progress Notes (Signed)
CARDIAC REHAB PHASE I   PRE:  Rate/Rhythm: 93 SR  BP:  Supine:   Sitting: 87/40, 101/50  Standing:    SaO2: 100% RA  MODE:  Ambulation: 426 ft   POST:  Rate/Rhythm: 113 ST  BP:  Supine:   Sitting: 103/57  Standing:    SaO2: 100% RA  0909-1004 Blood pressure low prior to ambulation at 87/40, asymptomatic, water given, education completed. Recheck BP prior to ambulation was 101/50. Patient tolerated ambulation well without symptoms, gait slow steady pushing rolling walker and wearing gait belt. To chair after ambulation, chair alarm engaged, call bell within reach.  OHS education reviewed including sternal precautions, IS use, restrictions, risk factor modification, tobacco cessation, and activity progression. Recovering from OHS book reviewed and given. Discussed phase 2 cardiac rehab program, and patient is interested in program in [place], will send referral. Patient verbalizes understanding of instructions given.    Artist Pais, MS, ACSM CEP

## 2021-01-05 NOTE — Progress Notes (Addendum)
      [address]       [place] [address]             [phone]      9 Days Post-Op Procedure(s) (LRB): CORONARY ARTERY BYPASS GRAFTING (CABG) X FOUR ON PUMP USING LEFT INTERNAL MAMMARY ARTERY AND RIGHT ENDOSCOPIC GREATER SAPHENOUS VEIN CONDUITS (N/A) TRANSESOPHAGEAL ECHOCARDIOGRAM (TEE) (N/A) APPLICATION OF CELL SAVER Subjective: States he walked yesterday but is still getting dizzy when he stands. Otherwise doing well.   Objective: Vital signs in last 24 hours: Temp:  [97.5 F (36.4 C)-98.5 F (36.9 C)] 98.5 F (36.9 C) (07/23 0355) Pulse Rate:  [85-111] 85 (07/23 0355) Cardiac Rhythm: Normal sinus rhythm (07/22 1900) Resp:  [13-18] 15 (07/23 0355) BP: (97-111)/(55-61) 111/61 (07/23 0355) SpO2:  [99 %-100 %] 100 % (07/23 0355) Weight:  [58 kg] 58 kg (07/23 0355)     Intake/Output from previous day: 07/22 0701 - 07/23 0700 In: -  Out: 720 [Urine:720] Intake/Output this shift: No intake/output data recorded.  General appearance: alert, cooperative, and no distress Heart: regular rate and rhythm, S1, S2 normal, no murmur, click, rub or gallop Lungs: clear to auscultation bilaterally Abdomen: soft, non-tender; bowel sounds normal; no masses,  no organomegaly Extremities: extremities normal, atraumatic, no cyanosis or edema Wound: clean and dry  Lab Results: No results for input(s): WBC, HGB, HCT, PLT in the last 72 hours. BMET:  Recent Labs    01/04/21 0623  CREATININE 0.94    PT/INR: No results for input(s): LABPROT, INR in the last 72 hours. ABG    Component Value Date/Time   PHART 7.485 (H) 12/28/2020 0433   HCO3 16.8 (L) 12/28/2020 0433   TCO2 17 (L) 12/28/2020 0433   ACIDBASEDEF 6.0 (H) 12/28/2020 0433   O2SAT 98.0 12/28/2020 0433   CBG (last 3)  Recent Labs    01/04/21 1615 01/04/21 2145 01/05/21 0601  GLUCAP 224* 225* 180*    Assessment/Plan: S/P Procedure(s) (LRB): CORONARY ARTERY BYPASS GRAFTING (CABG) X FOUR ON PUMP  USING LEFT INTERNAL MAMMARY ARTERY AND RIGHT ENDOSCOPIC GREATER SAPHENOUS VEIN CONDUITS (N/A) TRANSESOPHAGEAL ECHOCARDIOGRAM (TEE) (N/A) APPLICATION OF CELL SAVER  CV- Progressing slowly with mobility. On ASA, low-dose metoprolol, statin.  Sinus tachycardia.  Type 2 DM- pre-op A1C 9.9. Glucose climbing into 200s, add back lantus at his home dose, continue SSI AC/HS. Expected acute blood loss anemia-  continue to supplement Fe++. Creatinine 0.94, no other labs History of anxiety--on Buspar and Cymbalta History of migraine- on Topamax 25mg  BID DVT PPX- on daily SQ enoxaparin.  Plan: Will see how he does with walking today. Possibly home later today.   LOS: 12 days    01/05/2021   Chart reviewed, patient examined, agree with above. He ambulated with cardiac rehab with stable BP although on low side which is his norm even preop. He wants to go home and his wife is here and feels that she can take care of him at home. Will plan to discharge to home on midodrine 10 tid and then it can be titrated down in the office.

## 2021-01-05 NOTE — Progress Notes (Signed)
Mobility Specialist: Progress Note   01/05/21 1223  Mobility  Activity Ambulated in hall  Level of Assistance Standby assist, set-up cues, supervision of patient - no hands on  Assistive Device Front wheel walker  Distance Ambulated (ft) 470 ft  Mobility Ambulated with assistance in hallway  Mobility Response Tolerated well  Mobility performed by Mobility specialist  Bed Position Chair  $Mobility charge 1 Mobility   Pre-Mobility: 82 HR, 85/50 BP, 99% SpO2 Post-Mobility: 98 HR, 108/58 BP, 99% SpO2  Pt c/o his chest being more "tender" today than previous days, otherwise asx throughout. Pt back to the recliner after walk with call bell and phone at his side with family member present in the room.   [facility] [name] Mobility Specialist Mobility Specialist Phone: [phone]

## 2021-01-07 ENCOUNTER — Ambulatory Visit: Payer: Medicaid Other | Admitting: Podiatry

## 2021-01-07 ENCOUNTER — Encounter: Payer: Self-pay | Admitting: Podiatry

## 2021-01-07 ENCOUNTER — Ambulatory Visit (INDEPENDENT_AMBULATORY_CARE_PROVIDER_SITE_OTHER): Payer: Medicaid Other

## 2021-01-07 ENCOUNTER — Other Ambulatory Visit: Payer: Self-pay

## 2021-01-07 DIAGNOSIS — L988 Other specified disorders of the skin and subcutaneous tissue: Secondary | ICD-10-CM

## 2021-01-07 DIAGNOSIS — E785 Hyperlipidemia, unspecified: Secondary | ICD-10-CM

## 2021-01-07 DIAGNOSIS — E1169 Type 2 diabetes mellitus with other specified complication: Secondary | ICD-10-CM | POA: Diagnosis not present

## 2021-01-07 DIAGNOSIS — L97511 Non-pressure chronic ulcer of other part of right foot limited to breakdown of skin: Secondary | ICD-10-CM | POA: Diagnosis not present

## 2021-01-07 NOTE — Progress Notes (Addendum)
  Subjective:  Patient ID: [name], male    DOB: Sep 02, 1965,  MRN: [id number]  Chief Complaint  Patient presents with   ulcer    Lesion at Rt 4th and 5th toes interdigit x 3 wks -w/ redness, swelling and liuttle drainage tx; abx cream    55 y.o. male presents for wound care. Hx confirmed with patient.  Objective:  Physical Exam: Wound Location: 4th interspace Wound Base: Fibrotic slough Peri-wound: Macerated Exudate: None: wound tissue dry wound without warmth, erythema, signs of acute infection  Foot warm and well perfused. Palpable pulses.  No images are attached to the encounter.  Radiographs:  X-ray of the right foot: no soft tissue emphysema, no signs of osteomyelitis Assessment:   1. Ulcer of right foot, limited to breakdown of skin (HCC)   2. Maceration of skin   3. Hyperlipidemia associated with type 2 diabetes mellitus (HCC)    Plan:  Patient was evaluated and treated and all questions answered.  Ulcer right foot -XR reviewed with patient -Offload ulcer with surgical shoe -Surgical shoe dispensed. ABN signed and on file. -Dressed with betadine and DSD. Patient to apply betadine soaked gauze daily to keep the area dry and prevent pressure between the toes.  Return in about 3 weeks (around 01/28/2021) for Wound Care, Right.

## 2021-01-09 ENCOUNTER — Telehealth: Payer: Self-pay | Admitting: Family Medicine

## 2021-01-10 NOTE — Progress Notes (Signed)
Patient's wife requested letter for dental office.

## 2021-01-11 ENCOUNTER — Other Ambulatory Visit: Payer: Self-pay

## 2021-01-11 ENCOUNTER — Telehealth (INDEPENDENT_AMBULATORY_CARE_PROVIDER_SITE_OTHER): Payer: Self-pay | Admitting: Thoracic Surgery (Cardiothoracic Vascular Surgery)

## 2021-01-11 ENCOUNTER — Telehealth (HOSPITAL_COMMUNITY): Payer: Self-pay

## 2021-01-11 DIAGNOSIS — Z951 Presence of aortocoronary bypass graft: Secondary | ICD-10-CM

## 2021-01-11 NOTE — Progress Notes (Signed)
     [address]       [name] [address]             [phone]       Patient: Home Provider: Office Consent for Telemedicine visit obtained.  Today's visit was completed via a real-time telehealth (see specific modality noted below). The patient/authorized person provided oral consent at the time of the visit to engage in a telemedicine encounter with the present provider at [facility]. The patient/authorized person was informed of the potential benefits, limitations, and risks of telemedicine. The patient/authorized person expressed understanding that the laws that protect confidentiality also apply to telemedicine. The patient/authorized person acknowledged understanding that telemedicine does not provide emergency services and that he or she would need to call 911 or proceed to the nearest hospital for help if such a need arose.   Total time spent in the clinical discussion 10 minutes.  Telehealth Modality: Phone visit (audio only)  I had a telephone visit with Mr. Talmadge Coventry.  He has no complaints.  He states that he is ambulating about 20 minutes every day.  Overall he says he is doing well.  He will see me in clinic in 1 month with a chest x-ray.

## 2021-01-11 NOTE — Telephone Encounter (Signed)
Per phase I cardiac rehab, fax cardiac rehab referral to [place] [facility]. °

## 2021-01-14 ENCOUNTER — Other Ambulatory Visit: Payer: Self-pay | Admitting: *Deleted

## 2021-01-14 ENCOUNTER — Telehealth: Payer: Self-pay | Admitting: Cardiology

## 2021-01-14 DIAGNOSIS — R42 Dizziness and giddiness: Secondary | ICD-10-CM

## 2021-01-14 DIAGNOSIS — E118 Type 2 diabetes mellitus with unspecified complications: Secondary | ICD-10-CM

## 2021-01-14 DIAGNOSIS — Z794 Long term (current) use of insulin: Secondary | ICD-10-CM

## 2021-01-14 DIAGNOSIS — R441 Visual hallucinations: Secondary | ICD-10-CM

## 2021-01-14 NOTE — Telephone Encounter (Signed)
Midodrine would not cause hallucinations, blurry vision. Are episodes new since discharge or have they been ongoing? Concern for continued effects from anesthesia and/or infection as contributory. If episode recurs, likely should be evaluated in urgent care or ED. How are his wounds healing?  Recommend lab work tomorrow CBC, BMP if not evaluated in urgent care. He may have it collected at [facility] or a [facility] office - please assist patient to facilitate. This will allow [place] to assess for infection prior to office visit.   I have CC'd Dr. Shari Prows as an [place] Childes.   Alver Sorrow, NP

## 2021-01-14 NOTE — Telephone Encounter (Signed)
Spoke with pt's wife and pt has had altered mental status off and on  episodes may  last a few minutes or all night into the am.Pt's O2 has been running 97-99% sugar 150-180 and this is an improvement from the 300-400 and B/P has been normal Per wife pt's only new med is the Midodrine not sure if this is the culprit Pt has appt 8/4 at 2 Will forward to [facility] NP for review and recommendations ./cy

## 2021-01-14 NOTE — Telephone Encounter (Signed)
Wife of the patient called. The patient talks out of his head,  has had a lot of hallucinations and blurry vision. The wife did research and read that it can be a side effect of the heart surgery, but she was not sure how long these symptoms would last.  The patient is scheduled for a follow up visit with Garnetta Buddy, NP Thursday 01/17/21. The wife was concerned about waiting until this appt before addressing this concern

## 2021-01-14 NOTE — Telephone Encounter (Signed)
Pt's wife aware of recommendations and will bring pt tomorrow for lab work Per wife pt was having these episodes while in  hospital and was kept longer due to having these episodes Per wife wounds appear healing nicely no infection noted . If worsening S/S will go to Ed for eval and tx/cy

## 2021-01-15 ENCOUNTER — Other Ambulatory Visit: Payer: Medicaid Other | Admitting: *Deleted

## 2021-01-15 ENCOUNTER — Other Ambulatory Visit: Payer: Self-pay

## 2021-01-15 DIAGNOSIS — E118 Type 2 diabetes mellitus with unspecified complications: Secondary | ICD-10-CM

## 2021-01-15 DIAGNOSIS — R441 Visual hallucinations: Secondary | ICD-10-CM

## 2021-01-15 DIAGNOSIS — R42 Dizziness and giddiness: Secondary | ICD-10-CM

## 2021-01-15 LAB — BASIC METABOLIC PANEL
BUN/Creatinine Ratio: 28 — ABNORMAL HIGH (ref 9–20)
BUN: 26 mg/dL — ABNORMAL HIGH (ref 6–24)
CO2: 28 mmol/L (ref 20–29)
Calcium: 9.6 mg/dL (ref 8.7–10.2)
Chloride: 98 mmol/L (ref 96–106)
Creatinine, Ser: 0.92 mg/dL (ref 0.76–1.27)
Glucose: 285 mg/dL — ABNORMAL HIGH (ref 65–99)
Potassium: 4.7 mmol/L (ref 3.5–5.2)
Sodium: 136 mmol/L (ref 134–144)
eGFR: 98 mL/min/{1.73_m2} (ref >59)

## 2021-01-15 LAB — CBC
Hematocrit: 28.9 % — ABNORMAL LOW (ref 37.5–51.0)
Hemoglobin: 9.4 g/dL — ABNORMAL LOW (ref 13.0–17.7)
MCH: 28.2 pg (ref 26.6–33.0)
MCHC: 32.5 g/dL (ref 31.5–35.7)
MCV: 87 fL (ref 79–97)
Platelets: 569 10*3/uL — ABNORMAL HIGH (ref 150–450)
RBC: 3.33 x10E6/uL — ABNORMAL LOW (ref 4.14–5.80)
RDW: 14 % (ref 11.6–15.4)
WBC: 10.2 10*3/uL (ref 3.4–10.8)

## 2021-01-16 ENCOUNTER — Telehealth: Payer: Self-pay | Admitting: *Deleted

## 2021-01-16 ENCOUNTER — Telehealth (HOSPITAL_BASED_OUTPATIENT_CLINIC_OR_DEPARTMENT_OTHER): Payer: Self-pay | Admitting: Cardiology

## 2021-01-16 NOTE — Progress Notes (Signed)
Office Visit    Patient Name: [name] Date of Encounter: 01/17/2021  PCP:  Joice Lofts, NP   [facility]  Cardiologist:  Meriam Sprague, MD  Advanced Practice Provider:  No care team member to display Electrophysiologist:  None   Chief Complaint    [name] is a 55 y.o. male with a hx of CAD s/p CABG, DM2, HTN, HLD presents today for follow up after CABG   Past Medical History    Past Medical History:  Diagnosis Date   Anginal pain (HCC)    Ataxia    CAD (coronary artery disease)    Diabetes mellitus without complication (HCC)    Dizziness    Hyperlipidemia    Hypertension    Near syncope    Past Surgical History:  Procedure Laterality Date   CARDIAC CATHETERIZATION  12/24/2020   CORONARY ARTERY BYPASS GRAFT N/A 12/27/2020   Procedure: CORONARY ARTERY BYPASS GRAFTING (CABG) X FOUR ON PUMP USING LEFT INTERNAL MAMMARY ARTERY AND RIGHT ENDOSCOPIC GREATER SAPHENOUS VEIN CONDUITS;  Surgeon: Corliss Skains, MD;  Location: [facility] OR;  Service: Open Heart Surgery;  Laterality: N/A;   LEFT HEART CATH AND CORONARY ANGIOGRAPHY N/A 12/24/2020   Procedure: LEFT HEART CATH AND CORONARY ANGIOGRAPHY;  Surgeon: Kathleene Hazel, MD;  Location: [facility] INVASIVE CV LAB;  Service: Cardiovascular;  Laterality: N/A;   TEE WITHOUT CARDIOVERSION N/A 12/27/2020   Procedure: TRANSESOPHAGEAL ECHOCARDIOGRAM (TEE);  Surgeon: Corliss Skains, MD;  Location: [facility] OR;  Service: Open Heart Surgery;  Laterality: N/A;    Allergies  Allergies  Allergen Reactions   Atorvastatin Other (See Comments)    Myalgias     History of Present Illness    [name] is a 55 y.o. male with a hx of CAD s/p CABG, DM2, HTN, HLD last seen while hospitalized.  Seen in clinic by Dr. Shari Prows 12/10/20 and recommended for cardiac catheterization. Cardiac catheterization 12/24/20 with severe three vessel disease. Echo with LVEF 55-60%, normal RV function, no significant  valvular abnormalities. He was evaluated by TCTS and underwent CABGx4 (LIMA-SVG, reverse SVG-PDA, OM3/posterior lateral branch, diagonal). He had expected postoperative anemia and did not require transfusion. He was drowsy and lethargic though with no focal neurological defects on transition to progressive care. He progressed slowly due to orthostatic hypotension in setting of poor PO intake. His Carvedilol and Lisinopril were discontinued. He was discharged on Midodrine 10mg  TID.   His wife contacted the office noting the patient was having episodes of confusion and hallucinations. Per her report these were also present in the hospital. BMP and CBC were collected. Hb was 9.4 which was stable compared to discharge, WBC normal, platelets 5569, creatinine 0.92, GFR 98, K 4.7, Na 136.   Presents today for follow up with his wife.  Tells me he is "just not feeling good". Feels like he has "2 medicines that are working against each other but I don't know which ones they are". Still with episodes of amnesia from the anesthesia. He has not been eating or drinking much. His taste is still altered. He tells me he has been trying to walk indoors.   EKGs/Labs/Other Studies Reviewed:   The following studies were reviewed today:  Intraoperative echo 12/27/20 POST-OP IMPRESSIONS  Overall, there were no significant changes from pre-bypass.  - Comments: LV Hyperdynamic after separation from bypass.   PRE-OP FINDINGS   Left Ventricle: The left ventricle has normal systolic function, with an  ejection fraction  of 55-60%. The cavity size was normal. There is no left  ventricular hypertrophy.    Right Ventricle: The right ventricle has normal systolic function. The  cavity was normal. There is no increase in right ventricular wall  thickness.   Left Atrium: Left atrial size was normal in size. No left atrial/left  atrial appendage thrombus was detected.   Right Atrium: Right atrial size was normal in size.    Interatrial Septum: No atrial level shunt detected by color flow Doppler.   Pericardium: There is no evidence of pericardial effusion.   Mitral Valve: The mitral valve is normal in structure. Mitral valve  regurgitation is trivial by color flow Doppler. There is No evidence of  mitral stenosis.   Tricuspid Valve: The tricuspid valve was normal in structure. Tricuspid  valve regurgitation is trivial by color flow Doppler. No evidence of  tricuspid stenosis is present.   Aortic Valve: The aortic valve is tricuspid Aortic valve regurgitation was  not visualized by color flow Doppler. There is no stenosis of the aortic  valve. There is no evidence of aortic valve vegetation.    Pulmonic Valve: The pulmonic valve was normal in structure.  Pulmonic valve regurgitation is trivial by color flow Doppler.    Aorta: The ascending aorta is normal in size and structure. The aortic  arch was not well visualized. There is evidence of plaque in the  descending aorta; Grade III, measuring 3-50mm in size.   Doppler Pre CABG Right ABI: Resting right ankle-brachial index indicates moderate right  lower extremity arterial disease. The right toe-brachial index is  abnormal.  Left ABI: Resting left ankle-brachial index indicates moderate left lower  extremity arterial disease. The left toe-brachial index is abnormal.  Right Upper Extremity: Doppler waveforms remain within normal limits with  right radial compression. Doppler waveforms remain within normal limits  with right ulnar compression.  Left Upper Extremity: Doppler waveforms remain within normal limits with  left radial compression. Doppler waveform obliterate with left ulnar  compression.    LHC 12/24/20  Prox RCA lesion is 100% stenosed. Ost Cx to Prox Cx lesion is 70% stenosed. Dist Cx lesion is 99% stenosed. 1st Mrg lesion is 70% stenosed. Prox LAD to Mid LAD lesion is 70% stenosed. 1st Diag lesion is 70% stenosed. Mid LAD lesion  is 50% stenosed.   1. Severe three vessel CAD 2. Severe proximal to mid LAD stenosis. Severe stenosis in the moderate caliber first diagonal branch. 3. Severe stenosis proximal Circumflex and distal AV groove Circumflex. Severe stenosis first obtuse marginal branch. 4. Chronic occlusion of the dominant RCA. The PDA fills from left to right collaterals.   Recommendations: He has been having daily angina. Will admit to telemetry. Echo to assess LV function. CT surgery consult for CABG.  EKG:  EKG is ordered today.  The ekg ordered today demonstrates SR 95 bpm with stable inferior TWI, stable lateral TWI, anterior ST elevation improving compared to previous. Stable compared to previous.   Recent Labs: 12/27/2020: ALT 47 12/28/2020: Magnesium 2.3 01/15/2021: BUN 26; Creatinine, Ser 0.92; Hemoglobin 9.4; Platelets 569; Potassium 4.7; Sodium 136  Recent Lipid Panel    Component Value Date/Time   CHOL 130 12/26/2020 0455   TRIG 107 12/26/2020 0455   HDL 41 12/26/2020 0455   CHOLHDL 3.2 12/26/2020 0455   VLDL 21 12/26/2020 0455   LDLCALC 68 12/26/2020 0455     Home Medications   Current Meds  Medication Sig   Accu-Chek Softclix Lancets  lancets SMARTSIG:Topical   aspirin 81 MG chewable tablet Chew 1 tablet (81 mg total) by mouth daily.   busPIRone (BUSPAR) 10 MG tablet Take 10 mg by mouth 2 (two) times daily.   CYMBALTA 60 MG capsule Take 60 mg by mouth in the morning.   diphenhydrAMINE (BENADRYL) 25 MG tablet Take 25 mg by mouth in the morning.   glipiZIDE (GLUCOTROL) 10 MG tablet Take 10 mg by mouth 2 (two) times daily.   Insulin Glargine (BASAGLAR KWIKPEN) 100 UNIT/ML Inject 10 Units into the skin in the morning.   metFORMIN (GLUCOPHAGE) 1000 MG tablet Take 500 mg by mouth 2 (two) times daily.   omeprazole (PRILOSEC) 40 MG capsule Take 40 mg by mouth in the morning.   rosuvastatin (CRESTOR) 20 MG tablet Take 1 tablet (20 mg total) by mouth daily.   topiramate (TOPAMAX) 50 MG tablet  Take 1 tablet (50 mg total) by mouth 2 (two) times daily.   Vitamin D, Ergocalciferol, (DRISDOL) 1.25 MG (50000 UNIT) CAPS capsule Take 50,000 Units by mouth every Friday.   [DISCONTINUED] metoprolol tartrate (LOPRESSOR) 25 MG tablet Take 0.5 tablets (12.5 mg total) by mouth 2 (two) times daily.   [DISCONTINUED] midodrine (PROAMATINE) 10 MG tablet Take 1 tablet (10 mg total) by mouth 3 (three) times daily with meals.     Review of Systems      All other systems reviewed and are otherwise negative except as noted above.  Physical Exam    VS:  BP 110/68   Pulse 95   Ht 5\' 7"  (1.702 m)   Wt 122 lb 12.8 oz (55.7 kg)   SpO2 99%   BMI 19.23 kg/m  , BMI Body mass index is 19.23 kg/m.  Wt Readings from Last 3 Encounters:  01/17/21 122 lb 12.8 oz (55.7 kg)  01/05/21 127 lb 13.9 oz (58 kg)  12/19/20 133 lb (60.3 kg)     GEN: Well nourished, well developed, in no acute distress. HEENT: normal. Neck: Supple, no JVD, carotid bruits, or masses. Cardiac: RRR, no murmurs, rubs, or gallops. No clubbing, cyanosis, edema.  Radials/PT 2+ and equal bilaterally.  Respiratory:  Respirations regular and unlabored, clear to auscultation bilaterally. GI: Soft, nontender, nondistended. MS: No deformity or atrophy. Skin: Warm and dry, no rash. Neuro:  Strength and sensation are intact. Psych: Normal affect.  Assessment & Plan    CAD s/p CABG - Stable with no anginal symptoms. No indication for ischemic evaluation.  Chest tube sutures removed today. GDMT includes Rosuvastatin, Aspirin, Metoprolol. Encouraged to participate in [facility] PT and hopeful he will improve to the point of being able to participate in cardiac rehab.   Hypotension - Continue Midodrine 10mg  TID. Home monitoring encouraged.  DM2 - Continue to follow with PCP.   HLD, LDL goal <70 - Continue Rosuvastatin 20mg  QD.  Tobacco use - congratulated on continued cessation.  Disposition: Follow up in 2 month(s) with Dr. [facility] or  [facility].  Signed, , NP 01/17/2021, 5:37 PM [facility]

## 2021-01-16 NOTE — Telephone Encounter (Signed)
Alver Sorrow, NP  01/15/2021  5:34 PM EDT Back to Top     CBC with no evidence of infection.  Stable mild postoperative anemia. BUN mildly elevated suggestive of dehydration. Recommend increasing fluid intake. No evidence for cause of episodes of confusion - if recur recommend evaluationin ED otherwise f/u as scheduled 01/17/21  Attempted to call patient. Unable to leave voicemail.

## 2021-01-16 NOTE — Telephone Encounter (Signed)
Called to confirm 01/17/21 appointment----pateint would like his lab results from 01/16/21

## 2021-01-16 NOTE — Telephone Encounter (Signed)
The patient's wife has been notified of the result and verbalized understanding.  All questions (if any) were answered. Theresia Majors, RN 01/16/2021 1:39 PM

## 2021-01-16 NOTE — Telephone Encounter (Signed)
Patient's wife left message with answering service. Attempted to call both numbers provided without success. Both voice mailboxes were full so VM could not be left.

## 2021-01-17 ENCOUNTER — Other Ambulatory Visit: Payer: Self-pay

## 2021-01-17 ENCOUNTER — Encounter (HOSPITAL_BASED_OUTPATIENT_CLINIC_OR_DEPARTMENT_OTHER): Payer: Self-pay | Admitting: Family

## 2021-01-17 ENCOUNTER — Ambulatory Visit (HOSPITAL_BASED_OUTPATIENT_CLINIC_OR_DEPARTMENT_OTHER): Payer: Medicaid Other | Admitting: Family

## 2021-01-17 VITALS — BP 110/68 | HR 95 | Ht 67.0 in | Wt 122.8 lb

## 2021-01-17 DIAGNOSIS — I951 Orthostatic hypotension: Secondary | ICD-10-CM | POA: Diagnosis not present

## 2021-01-17 DIAGNOSIS — E785 Hyperlipidemia, unspecified: Secondary | ICD-10-CM

## 2021-01-17 DIAGNOSIS — E118 Type 2 diabetes mellitus with unspecified complications: Secondary | ICD-10-CM | POA: Diagnosis not present

## 2021-01-17 DIAGNOSIS — I25118 Atherosclerotic heart disease of native coronary artery with other forms of angina pectoris: Secondary | ICD-10-CM

## 2021-01-17 DIAGNOSIS — Z794 Long term (current) use of insulin: Secondary | ICD-10-CM

## 2021-01-17 DIAGNOSIS — Z720 Tobacco use: Secondary | ICD-10-CM | POA: Diagnosis not present

## 2021-01-17 MED ORDER — MIDODRINE HCL 10 MG PO TABS: 10.0000 mg | ORAL_TABLET | Freq: Three times a day (TID) | ORAL | 5 refills | Status: AC

## 2021-01-17 MED ORDER — METOPROLOL TARTRATE 25 MG PO TABS: 12.5000 mg | ORAL_TABLET | Freq: Two times a day (BID) | ORAL | 5 refills | Status: AC

## 2021-01-17 NOTE — Telephone Encounter (Signed)
Pt has appt today Also see lab results ./cy

## 2021-01-17 NOTE — Patient Instructions (Signed)
Medication Instructions:  Your physician has recommended you make the following change in your medication:    Recommend adding an iron supplement to your diet  *If you need a refill on your cardiac medications before your next appointment, please call your pharmacy*   Lab Work: None ordered today.   Testing/Procedures: Your EKG today was stable compared to previous.   Follow-Up: At [facility], you and your health needs are our priority.  As part of our continuing mission to provide you with exceptional heart care, we have created designated Provider Care Teams.  These Care Teams include your primary Cardiologist (physician) and Advanced Practice Providers (APPs -  Physician Assistants and Nurse Practitioners) who all work together to provide you with the care you need, when you need it.  We recommend signing up for the patient portal called "MyChart".  Sign up information is provided on this After Visit Summary.  MyChart is used to connect with patients for Virtual Visits (Telemedicine).  Patients are able to view lab/test results, encounter notes, upcoming appointments, etc.  Non-urgent messages can be sent to your provider as well.   To learn more about what you can do with MyChart, go to [email or web address].    Your next appointment:   2 month(s)  The format for your next appointment:   In Person  Provider:   You may see Meriam Sprague, MD or one of the following Advanced Practice Providers on your designated Care Team:   Ronie Spies, PA-C Jacolyn Reedy, PA-C   Other Instructions  Recommend trying Ensure or Boost (or the store brand!) to help increase your hydration and and calories.  Heart Healthy Diet Recommendations: A low-salt diet is recommended. Meats should be grilled, baked, or boiled. Avoid fried foods. Focus on lean protein sources like fish or chicken with vegetables and fruits. The American Heart Association is a Chief Technology Officer!   Exercise  recommendations: The American Heart Association recommends 150 minutes of moderate intensity exercise weekly. Try 30 minutes of moderate intensity exercise 4-5 times per week. This could include walking, jogging, or swimming.  Physical therapy will help you to gradually increase your physical activity.

## 2021-01-22 ENCOUNTER — Inpatient Hospital Stay (HOSPITAL_COMMUNITY): Payer: Medicaid Other

## 2021-01-22 ENCOUNTER — Emergency Department (HOSPITAL_COMMUNITY): Payer: Medicaid Other

## 2021-01-22 ENCOUNTER — Inpatient Hospital Stay (HOSPITAL_COMMUNITY)
Admission: EM | Admit: 2021-01-22 | Discharge: 2021-02-02 | DRG: 616 | Disposition: A | Discharge status: Home Health | Payer: Medicaid Other | Attending: Internal Medicine | Admitting: Internal Medicine

## 2021-01-22 ENCOUNTER — Other Ambulatory Visit: Payer: Self-pay

## 2021-01-22 DIAGNOSIS — Z9862 Peripheral vascular angioplasty status: Secondary | ICD-10-CM | POA: Diagnosis not present

## 2021-01-22 DIAGNOSIS — L03115 Cellulitis of right lower limb: Secondary | ICD-10-CM | POA: Diagnosis present

## 2021-01-22 DIAGNOSIS — Z09 Encounter for follow-up examination after completed treatment for conditions other than malignant neoplasm: Secondary | ICD-10-CM

## 2021-01-22 DIAGNOSIS — E43 Unspecified severe protein-calorie malnutrition: Secondary | ICD-10-CM | POA: Insufficient documentation

## 2021-01-22 DIAGNOSIS — I25110 Atherosclerotic heart disease of native coronary artery with unstable angina pectoris: Secondary | ICD-10-CM | POA: Diagnosis present

## 2021-01-22 DIAGNOSIS — Z20822 Contact with and (suspected) exposure to covid-19: Secondary | ICD-10-CM | POA: Diagnosis present

## 2021-01-22 DIAGNOSIS — M79671 Pain in right foot: Secondary | ICD-10-CM | POA: Diagnosis present

## 2021-01-22 DIAGNOSIS — Z7982 Long term (current) use of aspirin: Secondary | ICD-10-CM

## 2021-01-22 DIAGNOSIS — W19XXXA Unspecified fall, initial encounter: Secondary | ICD-10-CM | POA: Diagnosis present

## 2021-01-22 DIAGNOSIS — F32A Depression, unspecified: Secondary | ICD-10-CM | POA: Diagnosis present

## 2021-01-22 DIAGNOSIS — I70235 Atherosclerosis of native arteries of right leg with ulceration of other part of foot: Secondary | ICD-10-CM | POA: Diagnosis not present

## 2021-01-22 DIAGNOSIS — E1152 Type 2 diabetes mellitus with diabetic peripheral angiopathy with gangrene: Secondary | ICD-10-CM | POA: Diagnosis present

## 2021-01-22 DIAGNOSIS — I70261 Atherosclerosis of native arteries of extremities with gangrene, right leg: Secondary | ICD-10-CM | POA: Diagnosis present

## 2021-01-22 DIAGNOSIS — L97519 Non-pressure chronic ulcer of other part of right foot with unspecified severity: Secondary | ICD-10-CM | POA: Diagnosis present

## 2021-01-22 DIAGNOSIS — I10 Essential (primary) hypertension: Secondary | ICD-10-CM | POA: Diagnosis present

## 2021-01-22 DIAGNOSIS — Z888 Allergy status to other drugs, medicaments and biological substances status: Secondary | ICD-10-CM | POA: Diagnosis not present

## 2021-01-22 DIAGNOSIS — E7800 Pure hypercholesterolemia, unspecified: Secondary | ICD-10-CM | POA: Diagnosis present

## 2021-01-22 DIAGNOSIS — E119 Type 2 diabetes mellitus without complications: Secondary | ICD-10-CM | POA: Diagnosis not present

## 2021-01-22 DIAGNOSIS — Z951 Presence of aortocoronary bypass graft: Secondary | ICD-10-CM

## 2021-01-22 DIAGNOSIS — B9562 Methicillin resistant Staphylococcus aureus infection as the cause of diseases classified elsewhere: Secondary | ICD-10-CM | POA: Diagnosis present

## 2021-01-22 DIAGNOSIS — L02415 Cutaneous abscess of right lower limb: Secondary | ICD-10-CM | POA: Diagnosis present

## 2021-01-22 DIAGNOSIS — F17210 Nicotine dependence, cigarettes, uncomplicated: Secondary | ICD-10-CM | POA: Diagnosis present

## 2021-01-22 DIAGNOSIS — Z79899 Other long term (current) drug therapy: Secondary | ICD-10-CM | POA: Diagnosis not present

## 2021-01-22 DIAGNOSIS — M869 Osteomyelitis, unspecified: Secondary | ICD-10-CM | POA: Diagnosis present

## 2021-01-22 DIAGNOSIS — Z720 Tobacco use: Secondary | ICD-10-CM | POA: Diagnosis present

## 2021-01-22 DIAGNOSIS — E1140 Type 2 diabetes mellitus with diabetic neuropathy, unspecified: Secondary | ICD-10-CM | POA: Diagnosis present

## 2021-01-22 DIAGNOSIS — I951 Orthostatic hypotension: Secondary | ICD-10-CM | POA: Diagnosis present

## 2021-01-22 DIAGNOSIS — E1165 Type 2 diabetes mellitus with hyperglycemia: Secondary | ICD-10-CM | POA: Diagnosis present

## 2021-01-22 DIAGNOSIS — I96 Gangrene, not elsewhere classified: Secondary | ICD-10-CM

## 2021-01-22 DIAGNOSIS — E1169 Type 2 diabetes mellitus with other specified complication: Secondary | ICD-10-CM | POA: Diagnosis present

## 2021-01-22 DIAGNOSIS — E11621 Type 2 diabetes mellitus with foot ulcer: Secondary | ICD-10-CM | POA: Diagnosis present

## 2021-01-22 DIAGNOSIS — Z87891 Personal history of nicotine dependence: Secondary | ICD-10-CM | POA: Diagnosis not present

## 2021-01-22 DIAGNOSIS — I739 Peripheral vascular disease, unspecified: Secondary | ICD-10-CM | POA: Diagnosis not present

## 2021-01-22 DIAGNOSIS — M25774 Osteophyte, right foot: Secondary | ICD-10-CM

## 2021-01-22 DIAGNOSIS — Z7984 Long term (current) use of oral hypoglycemic drugs: Secondary | ICD-10-CM | POA: Diagnosis not present

## 2021-01-22 DIAGNOSIS — F419 Anxiety disorder, unspecified: Secondary | ICD-10-CM | POA: Diagnosis present

## 2021-01-22 DIAGNOSIS — Z794 Long term (current) use of insulin: Secondary | ICD-10-CM | POA: Diagnosis not present

## 2021-01-22 DIAGNOSIS — T8149XA Infection following a procedure, other surgical site, initial encounter: Secondary | ICD-10-CM

## 2021-01-22 DIAGNOSIS — A419 Sepsis, unspecified organism: Secondary | ICD-10-CM

## 2021-01-22 DIAGNOSIS — L97509 Non-pressure chronic ulcer of other part of unspecified foot with unspecified severity: Secondary | ICD-10-CM | POA: Diagnosis not present

## 2021-01-22 LAB — CBC WITH DIFFERENTIAL/PLATELET
Abs Immature Granulocytes: 0.03 10*3/uL (ref 0.00–0.07)
Basophils Absolute: 0.1 10*3/uL (ref 0.0–0.1)
Basophils Relative: 1 %
Eosinophils Absolute: 0.1 10*3/uL (ref 0.0–0.5)
Eosinophils Relative: 1 %
HCT: 32.7 % — ABNORMAL LOW (ref 39.0–52.0)
Hemoglobin: 10.7 g/dL — ABNORMAL LOW (ref 13.0–17.0)
Immature Granulocytes: 0 %
Lymphocytes Relative: 15 %
Lymphs Abs: 1.7 10*3/uL (ref 0.7–4.0)
MCH: 28.2 pg (ref 26.0–34.0)
MCHC: 32.7 g/dL (ref 30.0–36.0)
MCV: 86.3 fL (ref 80.0–100.0)
Monocytes Absolute: 0.8 10*3/uL (ref 0.1–1.0)
Monocytes Relative: 7 %
Neutro Abs: 8.5 10*3/uL — ABNORMAL HIGH (ref 1.7–7.7)
Neutrophils Relative %: 76 %
Platelets: 468 10*3/uL — ABNORMAL HIGH (ref 150–400)
RBC: 3.79 MIL/uL — ABNORMAL LOW (ref 4.22–5.81)
RDW: 13.4 % (ref 11.5–15.5)
WBC: 11.3 10*3/uL — ABNORMAL HIGH (ref 4.0–10.5)
nRBC: 0 % (ref 0.0–0.2)

## 2021-01-22 LAB — LACTIC ACID, PLASMA
Lactic Acid, Venous: 2.6 mmol/L (ref 0.5–1.9)
Lactic Acid, Venous: 1.8 mmol/L (ref 0.5–1.9)

## 2021-01-22 LAB — RESP PANEL BY RT-PCR (FLU A&B, COVID) ARPGX2
Influenza A by PCR: NEGATIVE
Influenza B by PCR: NEGATIVE
SARS Coronavirus 2 by RT PCR: NEGATIVE

## 2021-01-22 LAB — COMPREHENSIVE METABOLIC PANEL
ALT: 18 U/L (ref 0–44)
AST: 17 U/L (ref 15–41)
Albumin: 3.2 g/dL — ABNORMAL LOW (ref 3.5–5.0)
Alkaline Phosphatase: 280 U/L — ABNORMAL HIGH (ref 38–126)
Anion gap: 11 (ref 5–15)
BUN: 24 mg/dL — ABNORMAL HIGH (ref 6–20)
CO2: 24 mmol/L (ref 22–32)
Calcium: 9.9 mg/dL (ref 8.9–10.3)
Chloride: 100 mmol/L (ref 98–111)
Creatinine, Ser: 0.95 mg/dL (ref 0.61–1.24)
GFR, Estimated: >60 mL/min (ref >60)
Glucose, Bld: 295 mg/dL — ABNORMAL HIGH (ref 70–99)
Potassium: 4.6 mmol/L (ref 3.5–5.1)
Sodium: 135 mmol/L (ref 135–145)
Total Bilirubin: 0.3 mg/dL (ref 0.3–1.2)
Total Protein: 7.6 g/dL (ref 6.5–8.1)

## 2021-01-22 IMAGING — CR DG FOOT COMPLETE 3+V*R*
3 series · 3 of 3 positions shown · non-contrast
Comparison: [DATE]

CLINICAL DATA: Questionable sepsis. Diabetic. Open wounds over 4th
and 5th toes.

EXAM:
RIGHT FOOT COMPLETE - 3+ VIEW

[foot ap]
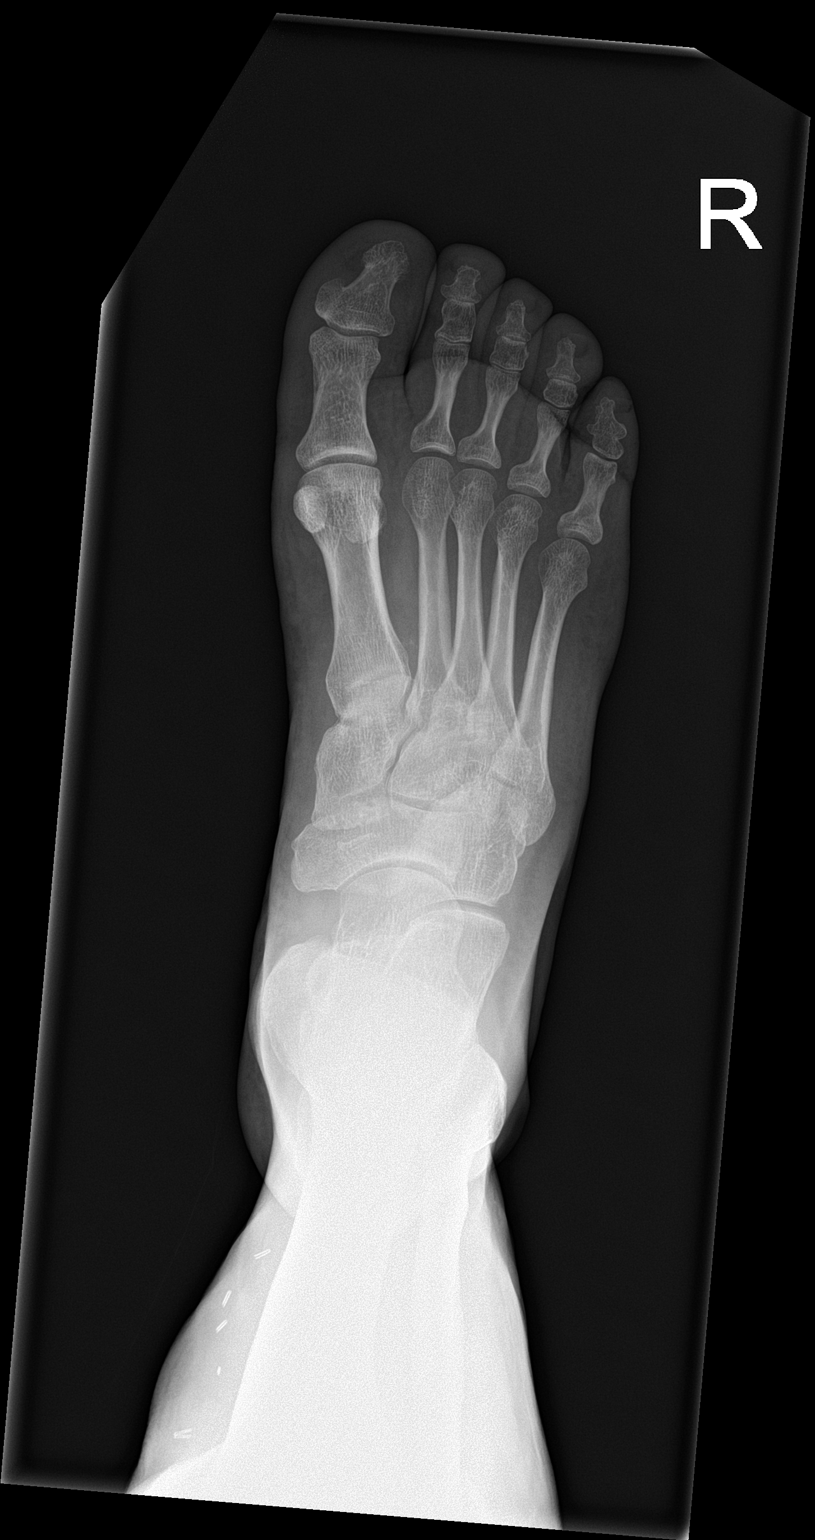

[foot obl]
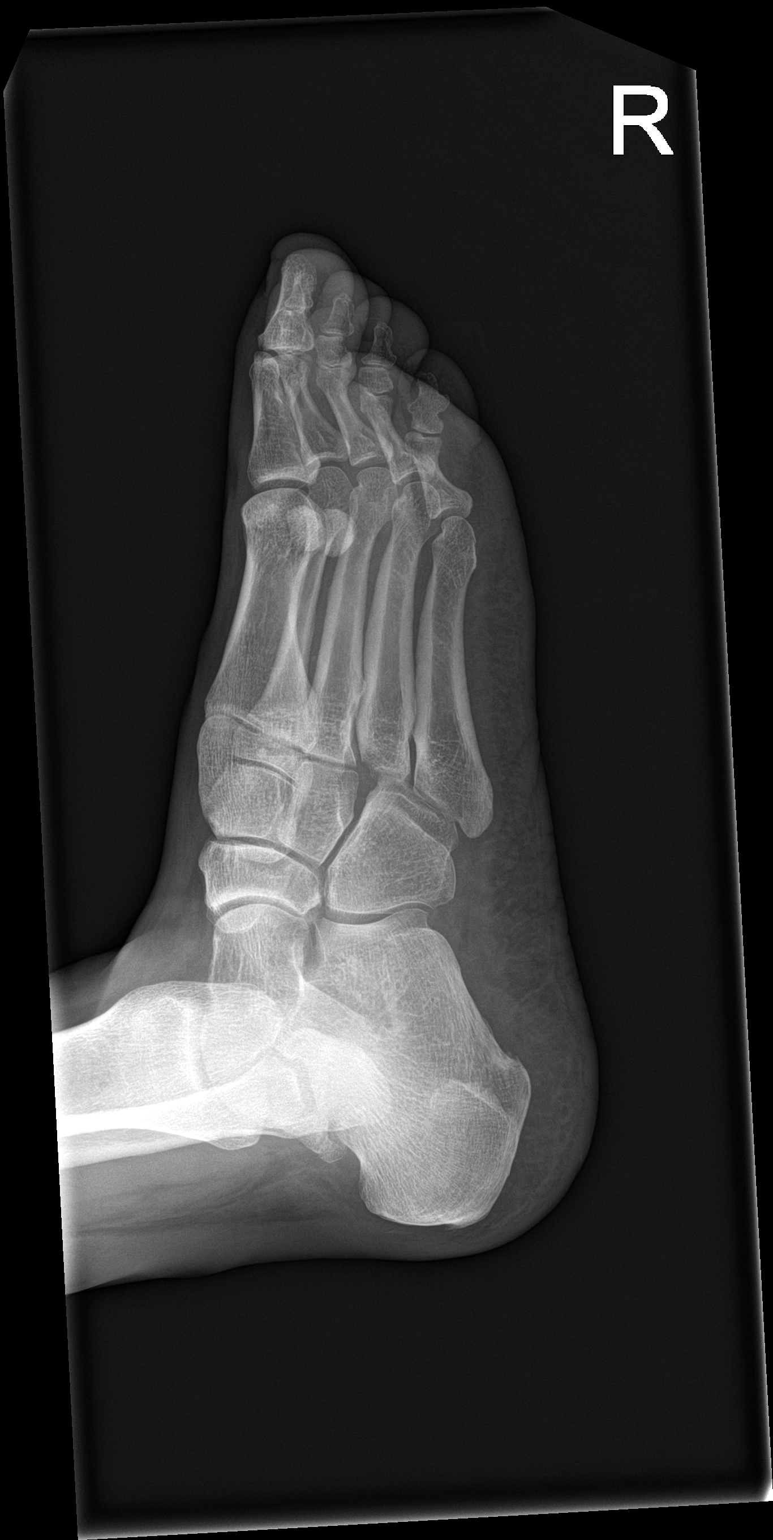

[foot lat]
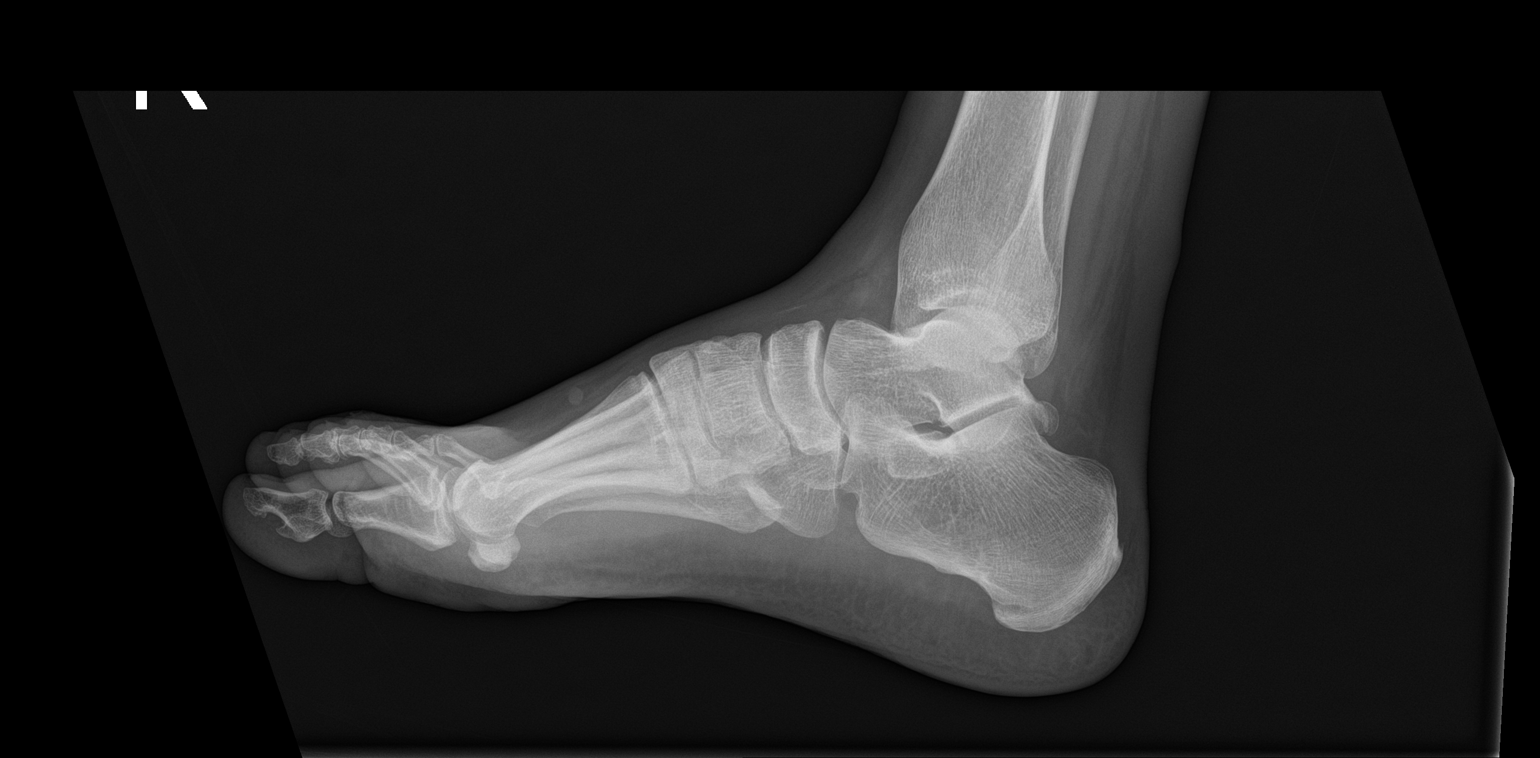

[3 of 3 positions shown; findings below may reference images not displayed]

FINDINGS: There is no evidence of fracture or dislocation. There is no
evidence of arthropathy or other focal bone abnormality. Soft
tissues are unremarkable. No evidence of bone destruction to suggest
osteomyelitis.
IMPRESSION: Negative.

## 2021-01-22 IMAGING — CR DG CHEST 1V
1 series · 1 of 1 positions shown · non-contrast
Comparison: None.

CLINICAL DATA: Diabetic.  Possible sepsis.

EXAM:
CHEST  1 VIEW

[chest ap]
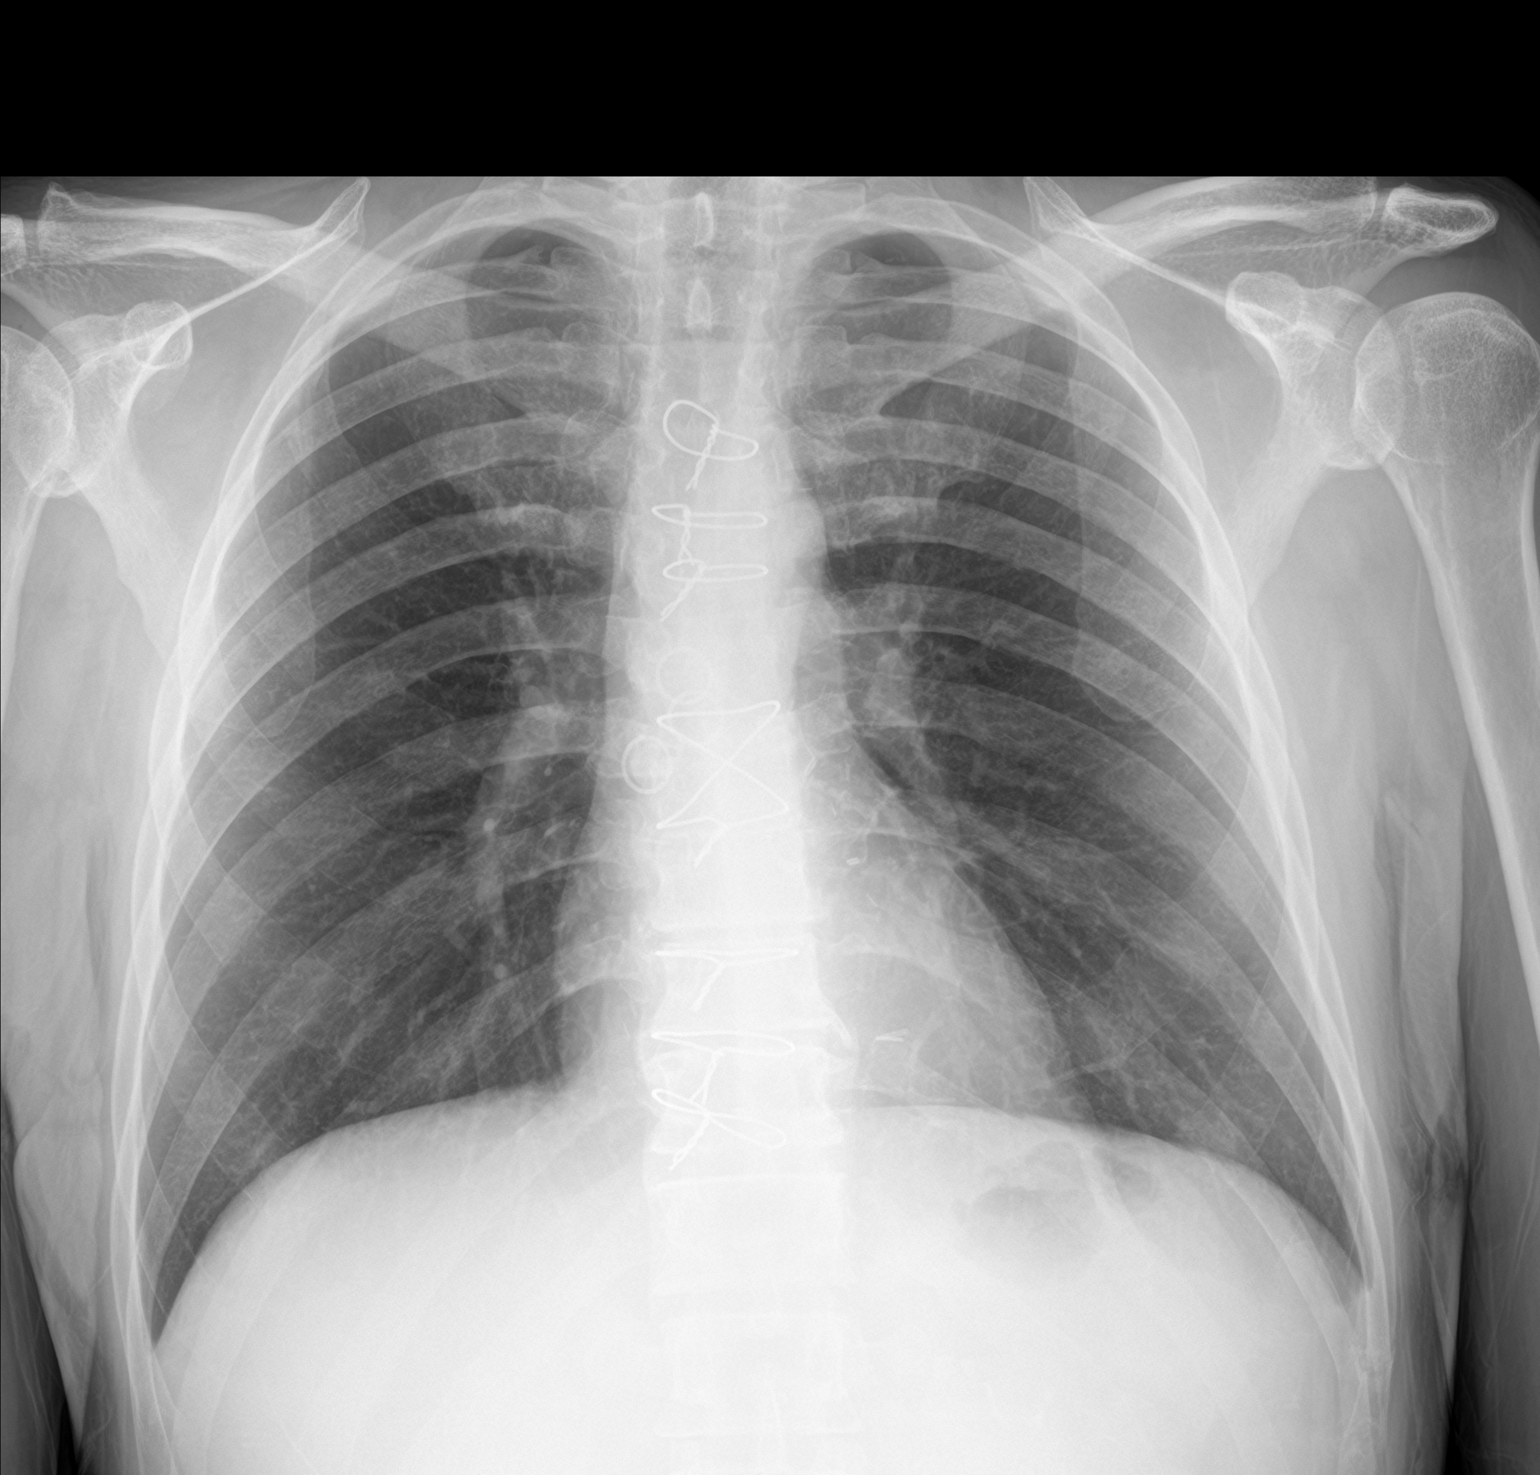

[1 of 1 positions shown; findings below may reference images not displayed]

FINDINGS: Prior CABG. Heart and mediastinal contours are within normal limits.
No focal opacities or effusions. No acute bony abnormality.
IMPRESSION: No active cardiopulmonary disease.

## 2021-01-22 IMAGING — MR MR FOOT*R* W/O CM
4 of 5 series · 19 of 40 positions shown · non-contrast
Comparison: Radiographs [DATE]

CLINICAL DATA: Diabetic with foot swelling laterally.

EXAM:
MRI OF THE RIGHT FOREFOOT WITHOUT CONTRAST
TECHNIQUE: Multiplanar, multisequence MR imaging of the right forefoot was
performed. No intravenous contrast was administered. Osteomyelitis
protocol MRI of the foot was obtained, to include the entire foot
and ankle. This protocol uses a large field of view to cover the
entire foot and ankle, and is suitable for assessing bony structures
for osteomyelitis. Due to the large field of view and imaging plane
choice, this protocol is less sensitive for assessing small
structures such as ligamentous structures of the foot and ankle,
compared to a dedicated forefoot or dedicated hindfoot exam.

[Series 10: T1 · coronal · 3.0mm · 0.27mm/px · 4 of 76 slices shown (1 of 2)]
[im 1/76]
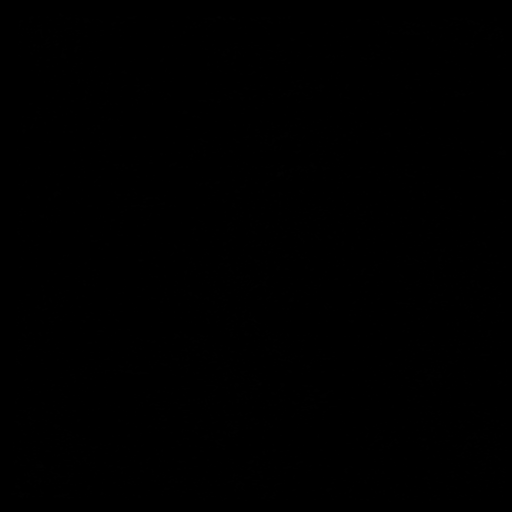
[im 13/76]
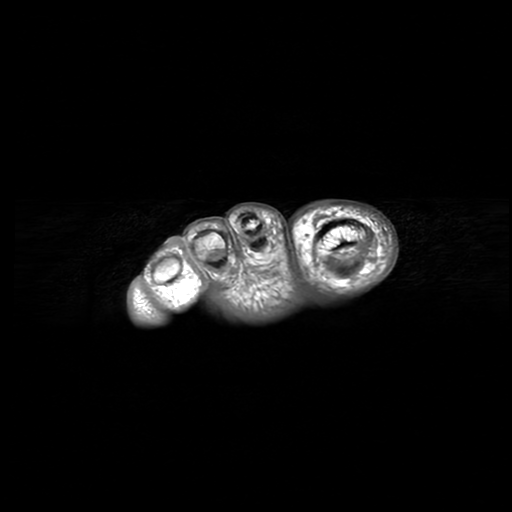
[im 38/76]
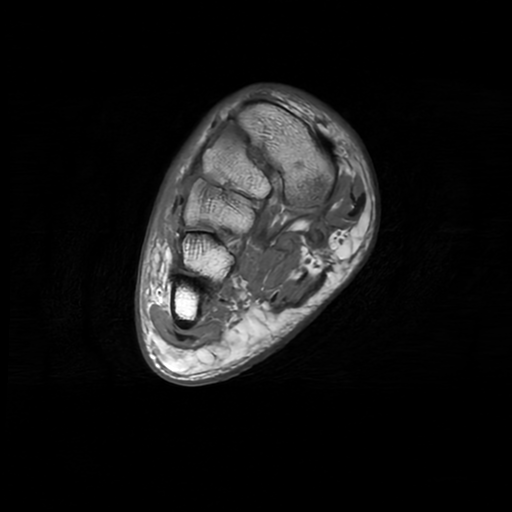
[im 63/76]
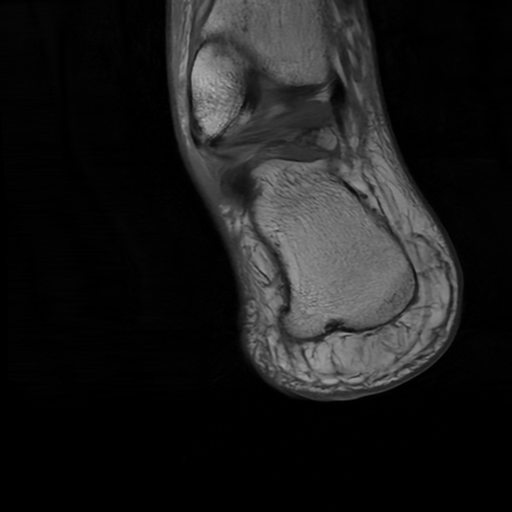

[Series 11: T2 fat-sat · coronal · 3.0mm · 0.27mm/px · 9 of 76 slices shown]
[im 1/76]
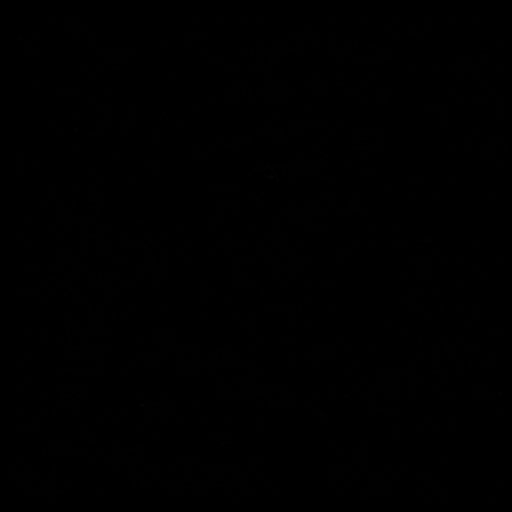
[im 14/76]
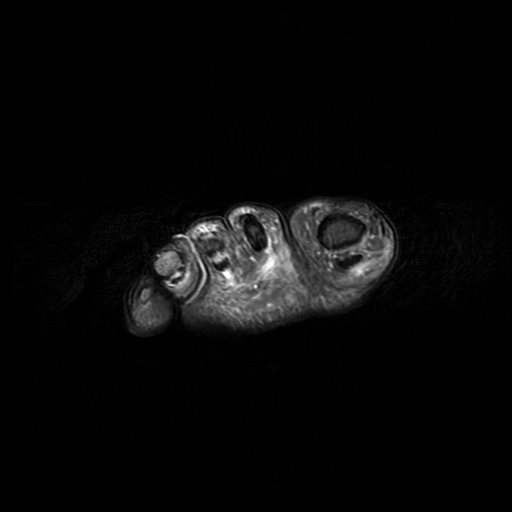
[im 21/76]
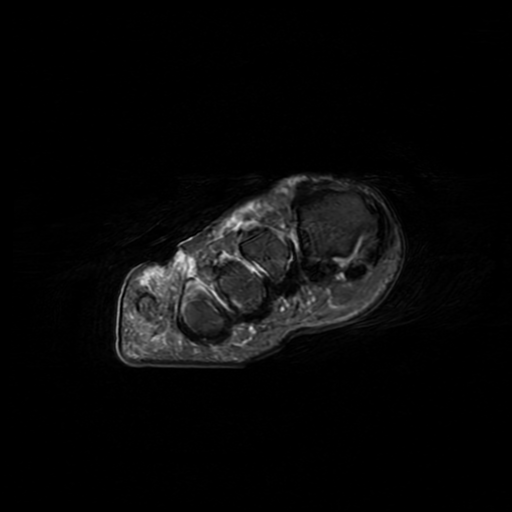
[im 35/76]
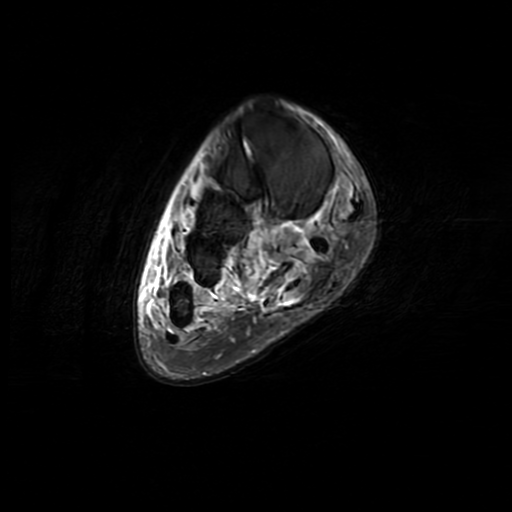
[im 41/76]
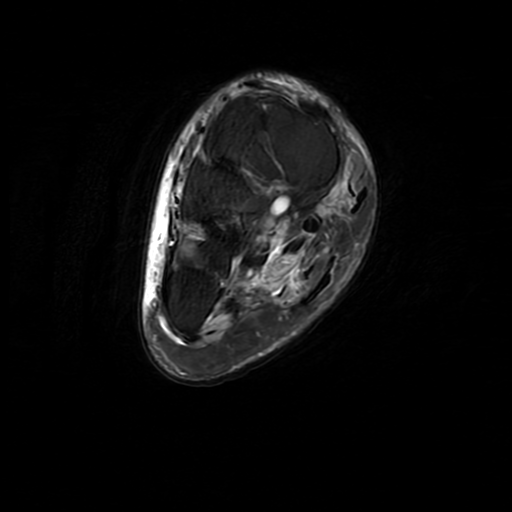
[im 55/76]
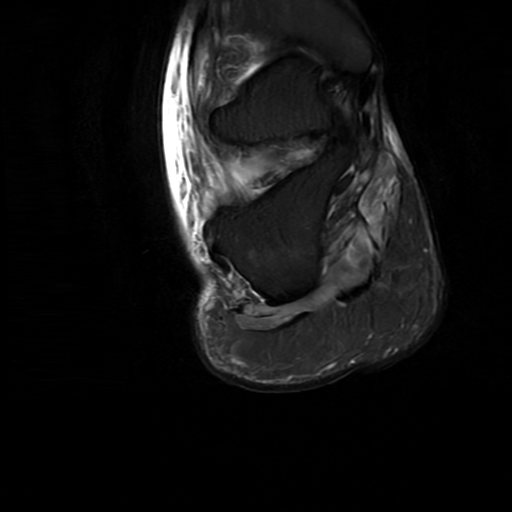
[im 62/76]
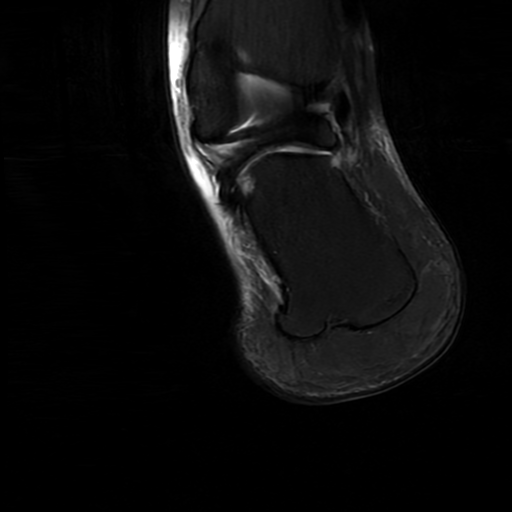
[im 69/76]
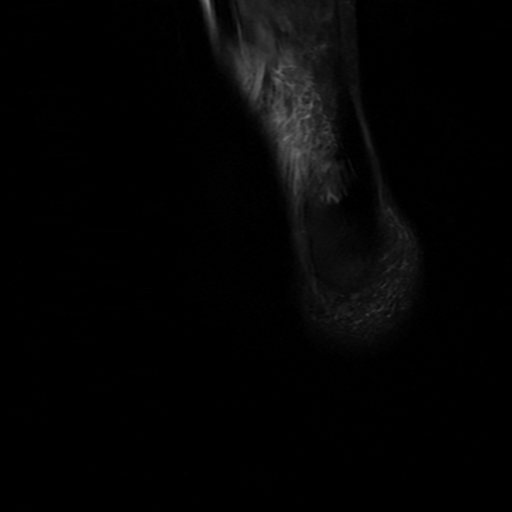
[im 76/76]
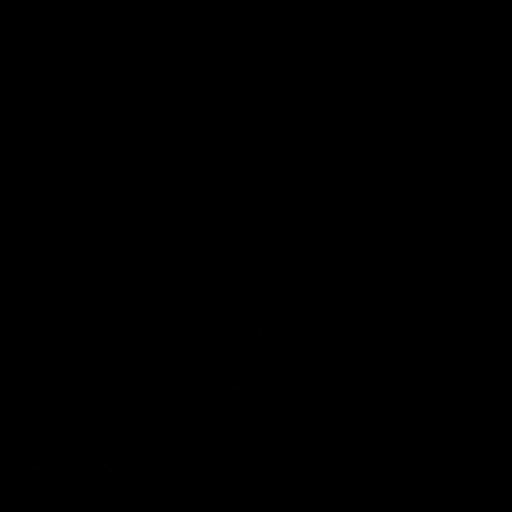

[Series 12: STIR · axial · 3.0mm · 0.47mm/px · z∈[-93,+5]mm · 3 of 31 slices shown]
[im 1/31]
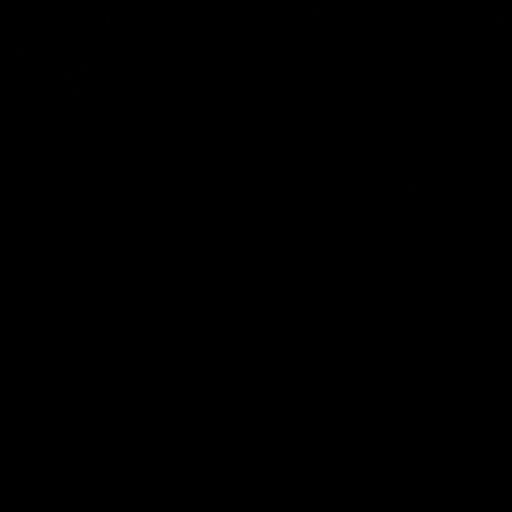
[im 16/31]
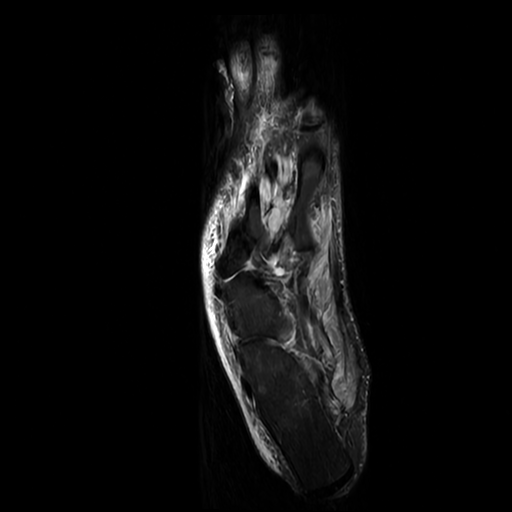
[im 31/31]
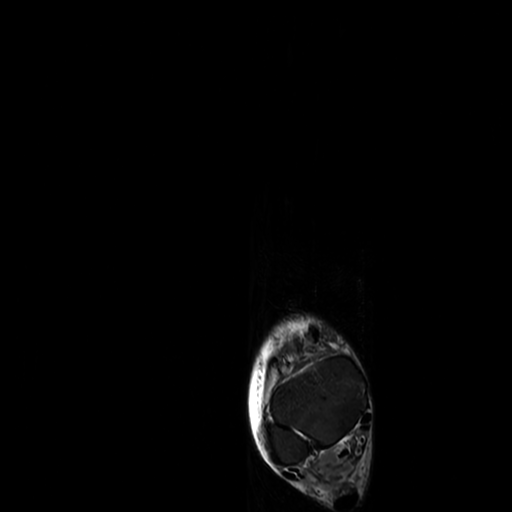

[Series 13: T1 · axial · 3.0mm · 0.47mm/px · z∈[-93,+5]mm · 3 of 31 slices shown (2 of 2)]
[im 1/31]
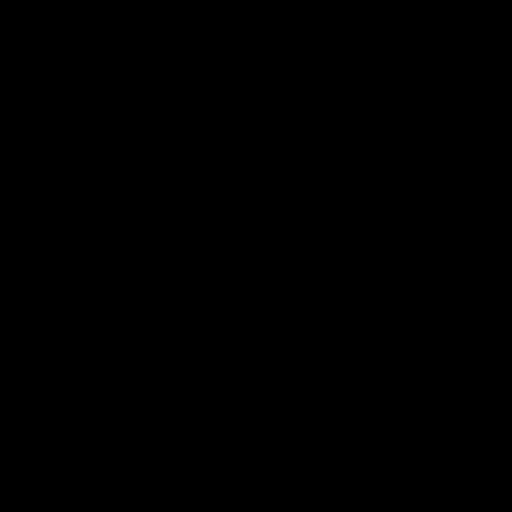
[im 16/31]
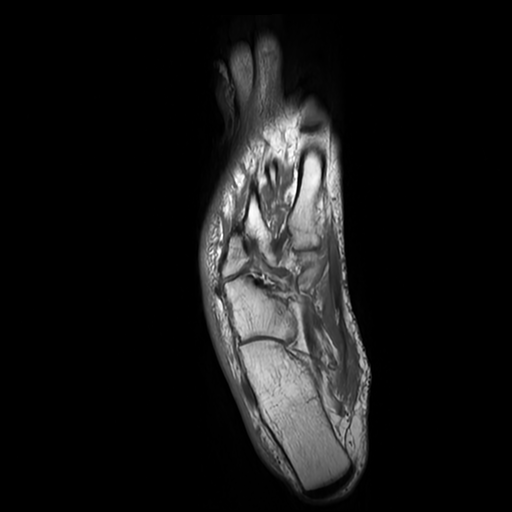
[im 31/31]
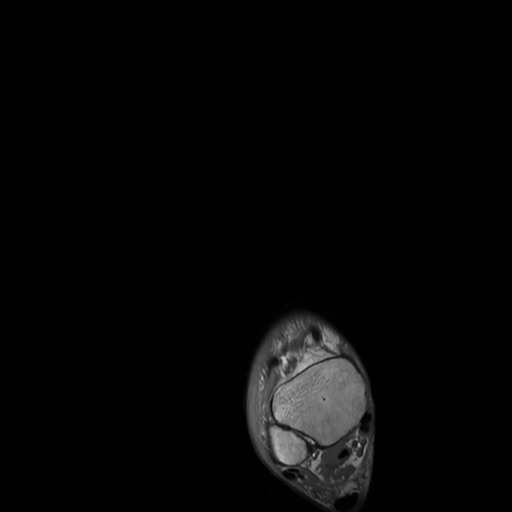

[19 of 40 positions shown; findings below may reference images not displayed]

FINDINGS: Bones/Joint/Cartilage

Mildly accentuated T2 weighted signal in the phalanges of the fourth
and fifth toes suspicious for early osteomyelitis. No overt bony
destruction.

Small degenerative foci of subcortical marrow edema in the
navicular.

Ligaments

Lisfranc ligament appears intact.

Muscles and Tendons

Diffuse low-grade edema in the regional musculature is likely
neurogenic.

Soft tissues

Ulceration and potentially tissue necrosis dorsally along the fourth
and fifth toes. Thinning of the soft tissues in these from this
region.

Mild dorsal subcutaneous edema tracking back along the lateral
ankle. Cellulitis not excluded.
IMPRESSION: 1. Ulceration and thinning of soft tissues along the fourth and
fifth toes, with subtle accentuated marrow edema signal in the
fourth and fifth phalanges (especially the proximal phalanges)
suspicious for early osteomyelitis.
2. Dorsal subcutaneous edema in the forefoot tracking back laterally
along the ankle, potentially from cellulitis.

## 2021-01-22 MED ORDER — PIPERACILLIN-TAZOBACTAM 3.375 G IVPB
3.3750 g | Freq: Three times a day (TID) | INTRAVENOUS | Status: DC
  Administered 2021-01-23 – 2021-02-01 (×27): 3.375 g via INTRAVENOUS
  Filled 2021-01-22 (×30): qty 50

## 2021-01-22 MED ORDER — SODIUM CHLORIDE 0.9 % IV BOLUS
500.0000 mL | Freq: Once | INTRAVENOUS | Status: AC
  Administered 2021-01-22: 500 mL via INTRAVENOUS

## 2021-01-22 MED ORDER — VANCOMYCIN HCL 1250 MG/250ML IV SOLN
1250.0000 mg | INTRAVENOUS | Status: DC
  Administered 2021-01-23 – 2021-01-29 (×7): 1250 mg via INTRAVENOUS
  Filled 2021-01-22 (×7): qty 250

## 2021-01-22 MED ORDER — VANCOMYCIN HCL 1250 MG/250ML IV SOLN
1250.0000 mg | Freq: Once | INTRAVENOUS | Status: DC
  Filled 2021-01-22: qty 250

## 2021-01-22 MED ORDER — PIPERACILLIN-TAZOBACTAM 3.375 G IVPB 30 MIN
3.3750 g | Freq: Once | INTRAVENOUS | Status: AC
  Administered 2021-01-22: 3.375 g via INTRAVENOUS
  Filled 2021-01-22: qty 50

## 2021-01-22 MED ORDER — FENTANYL CITRATE (PF) 100 MCG/2ML IJ SOLN
50.0000 ug | Freq: Once | INTRAMUSCULAR | Status: AC
  Administered 2021-01-22: 50 ug via INTRAVENOUS
  Filled 2021-01-22: qty 2

## 2021-01-22 NOTE — ED Triage Notes (Signed)
Pt is about 1 month post CABG.  Has had wound on right toes for over a month. Reports he has been seen for this and was told to keep it dry and return in 3 months. Pt is feeling generally unwell and weak.  Toes have foul odor and are black in color.  Pulses present to foot.

## 2021-01-22 NOTE — Progress Notes (Signed)
Pharmacy Antibiotic Note  [name] is a 55 y.o. male admitted on 01/22/2021 with  wound infection .  Pharmacy has been consulted for vancomycin and zosyn dosing.  Patient with history of history of CAD sp/ CABG x4, diabetes mellitus type 2, H LD, HTN, tobacco use disorder. Patient complaining of right foot wound.  Patient admitted in July and noticed small wound on right forth toe for which he was advised to follow-up with podiatry. Over past week, patient reports swelling and black discoloration to the right fourth and fifth toes.  More recently, patient has noticed redness around the fourth and fifth toes with worsening streaking up the right leg with increased odor overt the past 24 hours.  Plan: Zosyn 3.375g IV q8h (4 hour infusion). Vancomycin 1250 mg q24hr unless change in renal function Monitor renal function Obtain vancomycin levels as indicated F/u cultures De-escalate antibiotics when able  Height: 5\' 7"  (170.2 cm) Weight: 55.3 kg (122 lb) IBW/kg (Calculated) : 66.1  Temp (24hrs), Avg:98.4 F (36.9 C), Min:98.1 F (36.7 C), Max:98.6 F (37 C)  Recent Labs  Lab 01/22/21 1655 01/22/21 1701  WBC 11.3*  --   CREATININE 0.95  --   LATICACIDVEN  --  2.6*    Estimated Creatinine Clearance: 68.7 mL/min (by C-G formula based on SCr of 0.95 mg/dL).    Allergies  Allergen Reactions   Atorvastatin Other (See Comments)    Myalgias    Antimicrobials this admission: zosyn 8/9 >>  vancomycin 8/9 >>   Microbiology results: Pending  Thank you for allowing pharmacy to be a part of this patient's care.  10/9, PharmD, BCPS 01/22/2021 10:23 PM ED Clinical Pharmacist -  [phone]

## 2021-01-22 NOTE — H&P (Signed)
History and Physical   [name] WJX:[id number] DOB: 11-14-1965 DOA: 01/22/2021  Referring MD/NP/PA: Dr. Rosalia Hammersay  PCP: [name], [name], NP   Outpatient Specialists: Dr. Ventura SellersMichael Price, podiatry  Patient coming from: Home  Chief Complaint: Right leg also  HPI: [name] is a 55 y.o. male with medical history significant of diabetes, essential hypertension, tobacco abuse, hyperlipidemia, coronary artery disease status post coronary artery bypass grafting x4 performed on 12/27/2020 where the saphenous vein was harvested on his right leg.  Patient has developed infected wound on the site since then.  Patient was seen outpatient by podiatry.  The wound appears to be improving.  It all started that hospitalization when he noticed a small wound on the right fourth toe.  He has been applying Betadine.  Was given a surgical shoe.  In the last week however things have gotten worse.  More discoloration of the right fourth and fifth toes.  Also redness around the fifth and the fourth toe.  He has noticed more order and worsening symptoms in the last 24 hours so he came to the ER.  The toes look gangrenous and the surrounding foot has significant redness swelling and evidence of cellulitis.  Patient is being admitted to the hospital for evaluation and treatment of possible osteomyelitis.  ED Course: Temperature 98.6 blood pressure 130/69, pulse 98 respiratory rate of 18 oxygen sat 100% on room air.  White count is 11.3, hemoglobin 10.7 platelets 468.  Sodium 135 potassium 4.6 chloride 100 CO2 24 glucose 294 and creatinine 1.95.  COVID-19 screen is negative urinalysis is negative.  Blood cultures have been obtained.  MRI ordered and patient being admitted with possible osteomyelitis of the right foot.  Review of Systems: As per HPI otherwise 10 point review of systems negative.    Past Medical History:  Diagnosis Date   Anginal pain (HCC)    Ataxia    CAD (coronary artery disease)    Diabetes mellitus  without complication (HCC)    Dizziness    Hyperlipidemia    Hypertension    Near syncope     Past Surgical History:  Procedure Laterality Date   CARDIAC CATHETERIZATION  12/24/2020   CORONARY ARTERY BYPASS GRAFT N/A 12/27/2020   Procedure: CORONARY ARTERY BYPASS GRAFTING (CABG) X FOUR ON PUMP USING LEFT INTERNAL MAMMARY ARTERY AND RIGHT ENDOSCOPIC GREATER SAPHENOUS VEIN CONDUITS;  Surgeon: Corliss SkainsLightfoot, Harrell O, MD;  Location: [facility] OR;  Service: Open Heart Surgery;  Laterality: N/A;   LEFT HEART CATH AND CORONARY ANGIOGRAPHY N/A 12/24/2020   Procedure: LEFT HEART CATH AND CORONARY ANGIOGRAPHY;  Surgeon: Kathleene HazelMcAlhany, Christopher D, MD;  Location: [facility] INVASIVE CV LAB;  Service: Cardiovascular;  Laterality: N/A;   TEE WITHOUT CARDIOVERSION N/A 12/27/2020   Procedure: TRANSESOPHAGEAL ECHOCARDIOGRAM (TEE);  Surgeon: Corliss SkainsLightfoot, Harrell O, MD;  [facility];  Service: Open Heart Surgery;  Laterality: N/A;     reports that he has been smoking cigarettes. He has never used smokeless tobacco. No history on file for alcohol use and drug use.  Allergies  Allergen Reactions   Atorvastatin Other (See Comments)    Myalgias     No family history on file.   Prior to Admission medications   Medication Sig Start Date End Date Taking? Authorizing Provider  Accu-Chek Softclix Lancets lancets SMARTSIG:Topical 12/10/20   [provider]  aspirin 81 MG chewable tablet Chew 1 tablet (81 mg total) by mouth daily. 09/20/20   Derwood KaplanNanavati, Ankit, MD  busPIRone (BUSPAR) 10 MG tablet Take  10 mg by mouth 2 (two) times daily. 07/06/20   [provider]  CYMBALTA 60 MG capsule Take 60 mg by mouth in the morning. 07/06/20   [provider]  diphenhydrAMINE (BENADRYL) 25 MG tablet Take 25 mg by mouth in the morning.    [provider]  glipiZIDE (GLUCOTROL) 10 MG tablet Take 10 mg by mouth 2 (two) times daily. 07/06/20   [provider]  Insulin Glargine (BASAGLAR KWIKPEN) 100 UNIT/ML  Inject 10 Units into the skin in the morning. 07/06/20   [provider]  metFORMIN (GLUCOPHAGE) 1000 MG tablet Take 500 mg by mouth 2 (two) times daily. 07/06/20   [provider]  metoprolol tartrate (LOPRESSOR) 25 MG tablet Take 0.5 tablets (12.5 mg total) by mouth 2 (two) times daily. 01/17/21   Alver Sorrow, NP  midodrine (PROAMATINE) 10 MG tablet Take 1 tablet (10 mg total) by mouth 3 (three) times daily with meals. 01/17/21   Alver Sorrow, NP  omeprazole (PRILOSEC) 40 MG capsule Take 40 mg by mouth in the morning. 10/10/20   [provider]  rosuvastatin (CRESTOR) 20 MG tablet Take 1 tablet (20 mg total) by mouth daily. 12/10/20   Meriam Sprague, MD  topiramate (TOPAMAX) 50 MG tablet Take 1 tablet (50 mg total) by mouth 2 (two) times daily. 12/19/20   Penumalli, Glenford Bayley, MD  Vitamin D, Ergocalciferol, (DRISDOL) 1.25 MG (50000 UNIT) CAPS capsule Take 50,000 Units by mouth every Friday. 12/11/20   [provider]    Physical Exam: Vitals:   01/22/21 1646 01/22/21 1824 01/22/21 2000 01/22/21 2115  BP:  91/61 123/69 112/69  Pulse:  96 90 91  Resp:  15 17 17   Temp:  98.6 F (37 C)    TempSrc:  Oral    SpO2:  100% 100% 100%  Weight: 55.3 kg     Height: 5\' 7"  (1.702 m)         Constitutional: Acutely ill looking but no distress Vitals:   01/22/21 1646 01/22/21 1824 01/22/21 2000 01/22/21 2115  BP:  91/61 123/69 112/69  Pulse:  96 90 91  Resp:  15 17 17   Temp:  98.6 F (37 C)    TempSrc:  Oral    SpO2:  100% 100% 100%  Weight: 55.3 kg     Height: 5\' 7"  (1.702 m)      Eyes: PERRL, lids and conjunctivae normal ENMT: Mucous membranes are moist. Posterior pharynx clear of any exudate or lesions.Normal dentition.  Neck: normal, supple, no masses, no thyromegaly Respiratory: clear to auscultation bilaterally, no wheezing, no crackles. Normal respiratory effort. No accessory muscle use.  Cardiovascular: Regular rate and rhythm, no murmurs /  rubs / gallops. No extremity edema. 2+ pedal pulses. No carotid bruits.  Abdomen: no tenderness, no masses palpated. No hepatosplenomegaly. Bowel sounds positive.  Musculoskeletal: no clubbing / cyanosis.  Right foot swollen, fourth and fifth toes appear gangrenous draining serosanguineous fluids, no contractures. Normal muscle tone.  Skin: no rashes, lesions, ulcers. No induration Neurologic: CN 2-12 grossly intact. Sensation intact, DTR normal. Strength 5/5 in all 4.  Psychiatric: Normal judgment and insight. Alert and oriented x 3. Normal mood.     Labs on Admission: I have personally reviewed following labs and imaging studies  CBC: Recent Labs  Lab 01/22/21 1655  WBC 11.3*  NEUTROABS 8.5*  HGB 10.7*  HCT 32.7*  MCV 86.3  PLT 468*   Basic Metabolic Panel: Recent Labs  Lab 01/22/21 1655  NA 135  K 4.6  CL 100  CO2 24  GLUCOSE 295*  BUN 24*  CREATININE 0.95  CALCIUM 9.9   GFR: Estimated Creatinine Clearance: 68.7 mL/min (by C-G formula based on SCr of 0.95 mg/dL). Liver Function Tests: Recent Labs  Lab 01/22/21 1655  AST 17  ALT 18  ALKPHOS 280*  BILITOT 0.3  PROT 7.6  ALBUMIN 3.2*   No results for input(s): LIPASE, AMYLASE in the last 168 hours. No results for input(s): AMMONIA in the last 168 hours. Coagulation Profile: No results for input(s): INR, PROTIME in the last 168 hours. Cardiac Enzymes: No results for input(s): CKTOTAL, CKMB, CKMBINDEX, TROPONINI in the last 168 hours. BNP (last 3 results) No results for input(s): PROBNP in the last 8760 hours. HbA1C: No results for input(s): HGBA1C in the last 72 hours. CBG: No results for input(s): GLUCAP in the last 168 hours. Lipid Profile: No results for input(s): CHOL, HDL, LDLCALC, TRIG, CHOLHDL, LDLDIRECT in the last 72 hours. Thyroid Function Tests: No results for input(s): TSH, T4TOTAL, FREET4, T3FREE, THYROIDAB in the last 72 hours. Anemia Panel: No results for input(s): VITAMINB12, FOLATE,  FERRITIN, TIBC, IRON, RETICCTPCT in the last 72 hours. Urine analysis:    Component Value Date/Time   COLORURINE STRAW (A) 12/26/2020 1750   APPEARANCEUR CLEAR 12/26/2020 1750   LABSPEC 1.007 12/26/2020 1750   PHURINE 6.0 12/26/2020 1750   GLUCOSEU >=500 (A) 12/26/2020 1750   HGBUR NEGATIVE 12/26/2020 1750   BILIRUBINUR NEGATIVE 12/26/2020 1750   KETONESUR NEGATIVE 12/26/2020 1750   PROTEINUR NEGATIVE 12/26/2020 1750   UROBILINOGEN 1.0 09/08/2008 1337   NITRITE NEGATIVE 12/26/2020 1750   LEUKOCYTESUR NEGATIVE 12/26/2020 1750   Sepsis Labs: @LABRCNTIP (procalcitonin:4,lacticidven:4) )No results found for this or any previous visit (from the past 240 hour(s)).   Radiological Exams on Admission: DG Chest 1 View  Result Date: 01/22/2021 CLINICAL DATA:  Diabetic.  Possible sepsis. EXAM: CHEST  1 VIEW COMPARISON:  None. FINDINGS: Prior CABG. Heart and mediastinal contours are within normal limits. No focal opacities or effusions. No acute bony abnormality. IMPRESSION: No active cardiopulmonary disease. Electronically Signed   By: 03/24/2021 M.D.   On: 01/22/2021 17:56   DG Foot Complete Right  Result Date: 01/22/2021 CLINICAL DATA:  Questionable sepsis. Diabetic. Open wounds over 4th and 5th toes. EXAM: RIGHT FOOT COMPLETE - 3+ VIEW COMPARISON:  01/07/2021 FINDINGS: There is no evidence of fracture or dislocation. There is no evidence of arthropathy or other focal bone abnormality. Soft tissues are unremarkable. No evidence of bone destruction to suggest osteomyelitis. IMPRESSION: Negative. Electronically Signed   By: 01/09/2021 M.D.   On: 01/22/2021 17:56    EKG: Independently reviewed.  Sinus rhythm otherwise no significant changes  Assessment/Plan Principal Problem:   Cellulitis and abscess of right leg Active Problems:   Coronary artery disease involving native coronary artery of native heart with unstable angina pectoris (HCC)   Diabetes mellitus, type II, insulin dependent  (HCC)   Hyperlipidemia associated with type 2 diabetes mellitus (HCC)   Essential hypertension   Tobacco use   S/P CABG x 4     #1 cellulitis and possible osteo of the right foot and leg: Most likely due to vascular insufficiency and diabetic foot ulceration and infection.  Patient will be admitted.  Initiated on vancomycin and cefepime.  Podiatry consult in the morning.  Follow MRI results.  #2 coronary artery disease: Status post coronary artery bypass graft recently.  Appears  to be doing fine no complaint.  #3 insulin-dependent diabetes: Initiate sliding scale insulin.  Continue long-acting Lantus and glipizide.  Hold metformin  #4 essential hypertension: Continue metoprolol.  #5 depression with anxiety: Continue BuSpar and Cymbalta.  #6 hyperlipidemia: Continue statin.  #7 tobacco abuse: Patient quit about a month ago.  Continue counseling  #8 diabetic neuropathy: Continue gabapentin   DVT prophylaxis: Lovenox Code Status: Full code Family Communication: Wife Disposition Plan: Home Consults called: Consult podiatry in the morning Dr. Samuella Cota Admission status: Inpatient  Severity of Illness: The appropriate patient status for this patient is INPATIENT. Inpatient status is judged to be reasonable and necessary in order to provide the required intensity of service to ensure the patient's safety. The patient's presenting symptoms, physical exam findings, and initial radiographic and laboratory data in the context of their chronic comorbidities is felt to place them at high risk for further clinical deterioration. Furthermore, it is not anticipated that the patient will be medically stable for discharge from the hospital within 2 midnights of admission. The following factors support the patient status of inpatient.   " The patient's presenting symptoms include right foot ulcer. " The worrisome physical exam findings include draining right foot with gangrenous toes. " The initial  radiographic and laboratory data are worrisome because of possible osteomyelitis. " The chronic co-morbidities include diabetes and coronary artery disease.   * I certify that at the point of admission it is my clinical judgment that the patient will require inpatient hospital care spanning beyond 2 midnights from the point of admission due to high intensity of service, high risk for further deterioration and high frequency of surveillance required.Lonia Blood MD [facility] Pager [phone]  If 7PM-7AM, please contact night-coverage [email or web address] Password [facility]  01/22/2021, 9:24 PM

## 2021-01-22 NOTE — ED Notes (Signed)
Pt return from MRI

## 2021-01-22 NOTE — ED Notes (Signed)
Pt NAD in bed, a/ox4. Pt states in the last week his right foot on the 4th and 5th digits have began to look like this. Pt has black necrotic tissue to these digits. Delayed cap refill, cold to touch. Redness noted, marked by previous RN. Streaking noted as well up to shin. Pt denies fevers.

## 2021-01-22 NOTE — ED Provider Notes (Signed)
[facility]   CSN: [id number] Arrival date & time: 01/22/21  1636     History Chief Complaint  Patient presents with   Wound Infection    Arta SilenceBobby Decicco is a 55 y.o. male with a history of CAD sp/ CABG x4, diabetes mellitus type 2, H LD, HTN, tobacco use disorder s/p cessation 1 month ago who presents to the emergency department with a chief complaint of right foot wound.  The patient underwent a CABG x4 with saphenous vein harvest on July 14.  He reports that during the hospitalization, he noticed a small wound on right fourth toe during the hospitalization that he mention to his nurse.  States that he was advised to follow-up.  He reports that the wound was improving, but started to worsen again sometime later so he followed up with podiatry on July 25.  X-ray was obtained and patient was given a surgical shoe.  He was advised to apply Betadine soaked gauze daily, keep the area dry, prevent pressure between the toes, and follow-up for repeat evaluation in 3 weeks.  Patient was compliant with treatment.  However, over the last week, the patient reports that the wound began to worsen and he noticed swelling and black discoloration to the right fourth and fifth toes.  Over the last few days, he has noticed redness around the fourth and fifth toes with worsening streaking up the right leg.  Over the last 24 hours, both he and his wife have noticed a worsening odor to the right foot.  He reports constant, severe sharp pain to the area.  The pain is worse with pressure and weightbearing.  Improved with nonweightbearing.  No other known aggravating or alleviating factors.  He reports chills and generalized weakness, but denies fever, chest pain, shortness of breath, right ankle pain, left leg pain, abdominal pain, vomiting, palpitations.   Patient is a former smoker.  Smoking cessation began 3 days before his CABG.  The history is provided by the  patient, medical records and the spouse. No language interpreter was used.      Past Medical History:  Diagnosis Date   Anginal pain (HCC)    Ataxia    CAD (coronary artery disease)    Diabetes mellitus without complication (HCC)    Dizziness    Hyperlipidemia    Hypertension    Near syncope     Patient Active Problem List   Diagnosis Date Noted   Cellulitis and abscess of right leg 01/22/2021   S/P CABG x 4 12/27/2020   Diabetes mellitus, type II, insulin dependent (HCC) 12/25/2020   Hyperlipidemia associated with type 2 diabetes mellitus (HCC) 12/25/2020   Essential hypertension 12/25/2020   Tobacco use 12/25/2020   Unstable angina (HCC) 12/24/2020   Coronary artery disease involving native coronary artery of native heart with unstable angina pectoris [facility](HCC)     Past Surgical History:  Procedure Laterality Date   CARDIAC CATHETERIZATION  12/24/2020   CORONARY ARTERY BYPASS GRAFT N/A 12/27/2020   Procedure: CORONARY ARTERY BYPASS GRAFTING (CABG) X FOUR ON PUMP USING LEFT INTERNAL MAMMARY ARTERY AND RIGHT ENDOSCOPIC GREATER SAPHENOUS VEIN CONDUITS;  Surgeon: Corliss SkainsLightfoot, Harrell O, MD;  Location: [facility] OR;  Service: Open Heart Surgery;  Laterality: N/A;   LEFT HEART CATH AND CORONARY ANGIOGRAPHY N/A 12/24/2020   Procedure: LEFT HEART CATH AND CORONARY ANGIOGRAPHY;  Surgeon: Kathleene HazelMcAlhany, Christopher D, MD;  Location: [facility] INVASIVE CV LAB;  Service: Cardiovascular;  Laterality: N/A;  TEE WITHOUT CARDIOVERSION N/A 12/27/2020   Procedure: TRANSESOPHAGEAL ECHOCARDIOGRAM (TEE);  Surgeon: Corliss Skains, MD;  Location: [facility] OR;  Service: Open Heart Surgery;  Laterality: N/A;       No family history on file.  Social History   Tobacco Use   Smoking status: Every Day    Types: Cigarettes   Smokeless tobacco: Never    Home Medications Prior to Admission medications   Medication Sig Start Date End Date Taking? Authorizing Provider  Accu-Chek Softclix Lancets lancets SMARTSIG:Topical  12/10/20   [provider]  aspirin 81 MG chewable tablet Chew 1 tablet (81 mg total) by mouth daily. 09/20/20   Derwood Kaplan, MD  busPIRone (BUSPAR) 10 MG tablet Take 10 mg by mouth 2 (two) times daily. 07/06/20   [provider]  CYMBALTA 60 MG capsule Take 60 mg by mouth in the morning. 07/06/20   [provider]  diphenhydrAMINE (BENADRYL) 25 MG tablet Take 25 mg by mouth in the morning.    [provider]  glipiZIDE (GLUCOTROL) 10 MG tablet Take 10 mg by mouth 2 (two) times daily. 07/06/20   [provider]  Insulin Glargine (BASAGLAR KWIKPEN) 100 UNIT/ML Inject 10 Units into the skin in the morning. 07/06/20   [provider]  metFORMIN (GLUCOPHAGE) 1000 MG tablet Take 500 mg by mouth 2 (two) times daily. 07/06/20   [provider]  metoprolol tartrate (LOPRESSOR) 25 MG tablet Take 0.5 tablets (12.5 mg total) by mouth 2 (two) times daily. 01/17/21   Alver Sorrow, NP  midodrine (PROAMATINE) 10 MG tablet Take 1 tablet (10 mg total) by mouth 3 (three) times daily with meals. 01/17/21   Alver Sorrow, NP  omeprazole (PRILOSEC) 40 MG capsule Take 40 mg by mouth in the morning. 10/10/20   [provider]  rosuvastatin (CRESTOR) 20 MG tablet Take 1 tablet (20 mg total) by mouth daily. 12/10/20   Meriam Sprague, MD  topiramate (TOPAMAX) 50 MG tablet Take 1 tablet (50 mg total) by mouth 2 (two) times daily. 12/19/20   Penumalli, Glenford Bayley, MD  Vitamin D, Ergocalciferol, (DRISDOL) 1.25 MG (50000 UNIT) CAPS capsule Take 50,000 Units by mouth every Friday. 12/11/20   [provider]    Allergies    Atorvastatin  Review of Systems   Review of Systems  Constitutional:  Negative for appetite change, chills and fever.  HENT:  Negative for congestion and sore throat.   Respiratory:  Negative for shortness of breath.   Cardiovascular:  Negative for chest pain.  Gastrointestinal:  Negative for abdominal pain, diarrhea,  nausea and vomiting.  Genitourinary:  Negative for dysuria.  Musculoskeletal:  Positive for arthralgias and myalgias. Negative for back pain, gait problem and neck pain.  Skin:  Positive for color change and wound. Negative for rash.  Allergic/Immunologic: Negative for immunocompromised state.  Neurological:  Positive for weakness and light-headedness. Negative for dizziness, seizures, syncope, numbness and headaches.  Psychiatric/Behavioral:  Negative for confusion.    Physical Exam Updated Vital Signs BP 112/69   Pulse 91   Temp 98.6 F (37 C) (Oral)   Resp 17   Ht  (1.702 m)   Wt 55.3 kg   SpO2 100%   BMI 19.11 kg/m   Physical Exam Vitals and nursing note reviewed.  Constitutional:      Appearance: He is well-developed.  HENT:     Head: Normocephalic.  Eyes:     Conjunctiva/sclera: Conjunctivae normal.  Cardiovascular:  Rate and Rhythm: Normal rate and regular rhythm.     Heart sounds: No murmur heard. Pulmonary:     Effort: Pulmonary effort is normal.  Abdominal:     General: There is no distension.     Palpations: Abdomen is soft.  Genitourinary:    Comments: Black discoloration noted to the fourth and fifth digits on the right.  There is surrounding ecchymosis noted to the distal aspects of the fourth and fifth toe.  No involvement of the nail on the fourth digit.  Tender to palpation surrounding the wound.  Sensation is intact to all 4 distal aspects of the fourth and fifth digits, but decreased from digits 1 through 3.  There is mild swelling noted to the right foot.  No warmth.  Erythema noted at the base of the fourth and fifth digits with red streaking up the right leg.  Healing wounds noted from saphenous vein harvest from recent CABG.  No wound dehiscence.  Right ankle is nontender.  DP pulses are 2+ and symmetric. Musculoskeletal:        General: Tenderness and signs of injury present.     Cervical back: Neck supple.  Skin:    General: Skin is warm and  dry.  Neurological:     Mental Status: He is alert.  Psychiatric:        Behavior: Behavior normal.       ED Results / Procedures / Treatments   Labs (all labs ordered are listed, but only abnormal results are displayed) Labs Reviewed  CBC WITH DIFFERENTIAL/PLATELET - Abnormal; Notable for the following components:      Result Value   WBC 11.3 (*)    RBC 3.79 (*)    Hemoglobin 10.7 (*)    HCT 32.7 (*)    Platelets 468 (*)    Neutro Abs 8.5 (*)    All other components within normal limits  COMPREHENSIVE METABOLIC PANEL - Abnormal; Notable for the following components:   Glucose, Bld 295 (*)    BUN 24 (*)    Albumin 3.2 (*)    Alkaline Phosphatase 280 (*)    All other components within normal limits  LACTIC ACID, PLASMA - Abnormal; Notable for the following components:   Lactic Acid, Venous 2.6 (*)    All other components within normal limits  CULTURE, BLOOD (SINGLE)  RESP PANEL BY RT-PCR (FLU A&B, COVID) ARPGX2  LACTIC ACID, PLASMA  URINALYSIS, ROUTINE W REFLEX MICROSCOPIC    EKG EKG Interpretation  Date/Time:  Tuesday January 22 2021 17:07:36 EDT Ventricular Rate:  100 PR Interval:  136 QRS Duration: 74 QT Interval:  302 QTC Calculation: 389 R Axis:   52 Text Interpretation: Normal sinus rhythm Right atrial enlargement T wave abnormality, consider inferior ischemia T wave abnormality, consider anterolateral ischemia Abnormal ECG Confirmed by Margarita Grizzle [phone]) on 01/22/2021 5:11:56 PM  Radiology DG Chest 1 View  Result Date: 01/22/2021 CLINICAL DATA:  Diabetic.  Possible sepsis. EXAM: CHEST  1 VIEW COMPARISON:  None. FINDINGS: Prior CABG. Heart and mediastinal contours are within normal limits. No focal opacities or effusions. No acute bony abnormality. IMPRESSION: No active cardiopulmonary disease. Electronically Signed   By: Charlett Nose M.D.   On: 01/22/2021 17:56   DG Foot Complete Right  Result Date: 01/22/2021 CLINICAL DATA:  Questionable sepsis. Diabetic.  Open wounds over 4th and 5th toes. EXAM: RIGHT FOOT COMPLETE - 3+ VIEW COMPARISON:  01/07/2021 FINDINGS: There is no evidence of fracture or dislocation. There is  no evidence of arthropathy or other focal bone abnormality. Soft tissues are unremarkable. No evidence of bone destruction to suggest osteomyelitis. IMPRESSION: Negative. Electronically Signed   By: Charlett Nose M.D.   On: 01/22/2021 17:56    Procedures .Critical Care  Date/Time: 01/22/2021 9:47 PM Performed by: Barkley Boards, PA-C Authorized by: Barkley Boards, PA-C   Critical care provider statement:    Critical care time (minutes):  35   Critical care time was exclusive of:  Separately billable procedures and treating other patients and teaching time   Critical care was necessary to treat or prevent imminent or life-threatening deterioration of the following conditions:  Sepsis   Critical care was time spent personally by me on the following activities:  Ordering and performing treatments and interventions, ordering and review of laboratory studies, ordering and review of radiographic studies, pulse oximetry, re-evaluation of patient's condition, review of old charts, obtaining history from patient or surrogate, examination of patient, evaluation of patient's response to treatment and development of treatment plan with patient or surrogate   I assumed direction of critical care for this patient from another provider in my specialty: no     Care discussed with: admitting provider     Medications Ordered in ED Medications  piperacillin-tazobactam (ZOSYN) IVPB 3.375 g (0 g Intravenous Stopped 01/22/21 2118)    Followed by  piperacillin-tazobactam (ZOSYN) IVPB 3.375 g (has no administration in time range)  vancomycin (VANCOREADY) IVPB 1250 mg/250 mL (has no administration in time range)  sodium chloride 0.9 % bolus 500 mL (0 mLs Intravenous Stopped 01/22/21 2112)  fentaNYL (SUBLIMAZE) injection 50 mcg (50 mcg Intravenous Given 01/22/21  2049)    ED Course  I have reviewed the triage vital signs and the nursing notes.  Pertinent labs & imaging results that were available during my care of the patient were reviewed by me and considered in my medical decision making (see chart for details).    MDM Rules/Calculators/A&P                           55 year old male with a history of CAD sp/ CABG x4, diabetes mellitus type 2, H LD, HTN, tobacco use disorder s/p cessation 1 month ago who presents to the emergency department with a chief complaint of right foot wound.  Initial wound began 1 month ago, but seem to improve before worsening again over the last 2 weeks.  He reports associated generalized weakness and chills, no fevers.  Patient underwent CABG x4 with right saphenous vein harvest on July 14. Question secondary SSI from from saphenous vein harvest.  He does have a history of tobacco use, now on cessation, but has never been diagnosed with PVD.  He 1 brief episode of soft blood pressure, 91/61.  However, blood pressure has otherwise been normotensive.  Afebrile.  No hypoxia or tachypnea.  Labs and imaging of been reviewed and independently interpreted by me.  Exam is concerning for dry gangrene of the right fourth and fifth toes with streaking up the right leg.   Mild leukocytosis of 11.3.  Lactate is elevated at 2.6 and repeat has normalized at 1.8 after he was given 500 cc of IV fluids.  He has hyperglycemia of 295, but no evidence of DKA or HHS.  Alkaline phosphatase is elevated at 280, which is new from labs obtained on July 14.  Could question if this is elevated secondary to bony involvement of the wound in  his right foot.  X-ray of the right foot with no evidence of bony destruction suggestive of osteomyelitis.  We will start the patient on Zosyn and vancomycin.  MRI of the right foot has been ordered.  Fentanyl given for pain control.  The patient was discussed with Dr. Lynelle Doctor, attending physician.  Consult to the  hospitalist team for admission and Dr. Mikeal Hawthorne will accept the patient. The patient appears reasonably stabilized for admission considering the current resources, flow, and capabilities available in the ED at this time, and I doubt any other [facility] requiring further screening and/or treatment in the ED prior to admission.   Final Clinical Impression(s) / ED Diagnoses Final diagnoses:  Dry gangrene [facility])    Rx / DC Orders ED Discharge Orders     None        Priyana Mccarey A, PA-C 01/22/21 2147    Linwood Dibbles, MD 01/23/21 2022

## 2021-01-22 NOTE — ED Provider Notes (Signed)
Emergency Medicine Provider Triage Evaluation Note  [name] , a 55 y.o. male  was evaluated in triage.  Pt complains of right foot discoloration sent in by physician.  Recent CABG about a month ago, reports feeling unwell, some subjective chills and weakness.  Toes appear to be black in color, pulses are present to the foot but with surrounding erythema.  Suspicion for sepsis, labs have been ordered.  Review of Systems  Positive: Right foot pain, chills, weakness Negative: Back pain, shortness of breath  Physical Exam  BP (!) 113/58 (BP Location: Left Arm)   Pulse 98   Temp 98.1 F (36.7 C) (Oral)   Resp 18   Ht 5\' 7"  (1.702 m)   Wt 55.3 kg   SpO2 100%   BMI 19.11 kg/m  Gen:   Awake, no distress   Resp:  Normal effort MSK:   Moves extremities without difficulty  Other:  Please see photos attached   Medical Decision Making  Medically screening exam initiated at 5:01 PM.  Appropriate orders placed.  [name] was informed that the remainder of the evaluation will be completed by another provider, this initial triage assessment does not replace that evaluation, and the importance of remaining in the ED until their evaluation is complete.        [name], PA-C 01/22/21 1702    03/24/21, MD 01/25/21 1051

## 2021-01-23 ENCOUNTER — Encounter (HOSPITAL_COMMUNITY): Payer: Self-pay | Admitting: Internal Medicine

## 2021-01-23 DIAGNOSIS — I96 Gangrene, not elsewhere classified: Secondary | ICD-10-CM | POA: Diagnosis not present

## 2021-01-23 DIAGNOSIS — L02415 Cutaneous abscess of right lower limb: Secondary | ICD-10-CM

## 2021-01-23 DIAGNOSIS — L03115 Cellulitis of right lower limb: Secondary | ICD-10-CM

## 2021-01-23 LAB — CBC
HCT: 34.7 % — ABNORMAL LOW (ref 39.0–52.0)
Hemoglobin: 11.1 g/dL — ABNORMAL LOW (ref 13.0–17.0)
MCH: 27.5 pg (ref 26.0–34.0)
MCHC: 32 g/dL (ref 30.0–36.0)
MCV: 86.1 fL (ref 80.0–100.0)
Platelets: 339 10*3/uL (ref 150–400)
RBC: 4.03 MIL/uL — ABNORMAL LOW (ref 4.22–5.81)
RDW: 13.3 % (ref 11.5–15.5)
WBC: 8 10*3/uL (ref 4.0–10.5)
nRBC: 0 % (ref 0.0–0.2)

## 2021-01-23 LAB — GLUCOSE, CAPILLARY
Glucose-Capillary: 79 mg/dL (ref 70–99)
Glucose-Capillary: 170 mg/dL — ABNORMAL HIGH (ref 70–99)

## 2021-01-23 LAB — URINALYSIS, ROUTINE W REFLEX MICROSCOPIC
Bilirubin Urine: NEGATIVE
Glucose, UA: NEGATIVE mg/dL
Hgb urine dipstick: NEGATIVE
Ketones, ur: NEGATIVE mg/dL
Leukocytes,Ua: NEGATIVE
Nitrite: NEGATIVE
Protein, ur: NEGATIVE mg/dL
Specific Gravity, Urine: 1.013 (ref 1.005–1.030)
pH: 5 (ref 5.0–8.0)

## 2021-01-23 LAB — CBG MONITORING, ED: Glucose-Capillary: 132 mg/dL — ABNORMAL HIGH (ref 70–99)

## 2021-01-23 LAB — CREATININE, SERUM
Creatinine, Ser: 0.79 mg/dL (ref 0.61–1.24)
GFR, Estimated: >60 mL/min (ref >60)

## 2021-01-23 LAB — HIV ANTIBODY (ROUTINE TESTING W REFLEX): HIV Screen 4th Generation wRfx: NONREACTIVE

## 2021-01-23 MED ORDER — ONDANSETRON HCL 4 MG PO TABS: 4.0000 mg | ORAL_TABLET | Freq: Four times a day (QID) | ORAL | Status: DC | PRN

## 2021-01-23 MED ORDER — INSULIN GLARGINE-YFGN 100 UNIT/ML ~~LOC~~ SOLN
10.0000 [IU] | Freq: Every day | SUBCUTANEOUS | Status: DC
  Administered 2021-01-23 – 2021-01-25 (×3): 10 [IU] via SUBCUTANEOUS
  Filled 2021-01-23 (×3): qty 0.1

## 2021-01-23 MED ORDER — ACETAMINOPHEN 500 MG PO TABS
1000.0000 mg | ORAL_TABLET | Freq: Four times a day (QID) | ORAL | Status: DC | PRN
  Administered 2021-01-23 – 2021-01-26 (×3): 1000 mg via ORAL
  Filled 2021-01-23 (×4): qty 2

## 2021-01-23 MED ORDER — INSULIN ASPART 100 UNIT/ML IJ SOLN: 0.0000 [IU] | INTRAMUSCULAR | Status: DC

## 2021-01-23 MED ORDER — VITAMIN D (ERGOCALCIFEROL) 1.25 MG (50000 UNIT) PO CAPS
50000.0000 [IU] | ORAL_CAPSULE | ORAL | Status: DC
  Administered 2021-01-25 – 2021-02-01 (×2): 50000 [IU] via ORAL
  Filled 2021-01-23 (×3): qty 1

## 2021-01-23 MED ORDER — DULOXETINE HCL 60 MG PO CPEP
60.0000 mg | ORAL_CAPSULE | Freq: Every morning | ORAL | Status: DC
  Administered 2021-01-23 – 2021-02-02 (×11): 60 mg via ORAL
  Filled 2021-01-23 (×10): qty 1

## 2021-01-23 MED ORDER — METOPROLOL TARTRATE 12.5 MG HALF TABLET
12.5000 mg | ORAL_TABLET | Freq: Two times a day (BID) | ORAL | Status: DC
  Administered 2021-01-23 – 2021-02-02 (×21): 12.5 mg via ORAL
  Filled 2021-01-23 (×21): qty 1

## 2021-01-23 MED ORDER — GABAPENTIN 300 MG PO CAPS
300.0000 mg | ORAL_CAPSULE | Freq: Three times a day (TID) | ORAL | Status: DC
  Administered 2021-01-23 – 2021-02-02 (×31): 300 mg via ORAL
  Filled 2021-01-23 (×31): qty 1

## 2021-01-23 MED ORDER — FERROUS SULFATE 325 (65 FE) MG PO TABS
325.0000 mg | ORAL_TABLET | Freq: Every day | ORAL | Status: DC
  Administered 2021-01-23 – 2021-02-02 (×11): 325 mg via ORAL
  Filled 2021-01-23 (×11): qty 1

## 2021-01-23 MED ORDER — MORPHINE SULFATE (PF) 2 MG/ML IV SOLN
2.0000 mg | INTRAVENOUS | Status: DC | PRN
  Administered 2021-01-23 – 2021-02-01 (×4): 2 mg via INTRAVENOUS
  Filled 2021-01-23 (×4): qty 1

## 2021-01-23 MED ORDER — TOPIRAMATE 25 MG PO TABS
50.0000 mg | ORAL_TABLET | Freq: Two times a day (BID) | ORAL | Status: DC
  Administered 2021-01-23 – 2021-02-02 (×21): 50 mg via ORAL
  Filled 2021-01-23 (×22): qty 2

## 2021-01-23 MED ORDER — INSULIN ASPART 100 UNIT/ML IJ SOLN
0.0000 [IU] | Freq: Three times a day (TID) | INTRAMUSCULAR | Status: DC
  Administered 2021-01-23 – 2021-01-24 (×2): 1 [IU] via SUBCUTANEOUS
  Administered 2021-01-24: 2 [IU] via SUBCUTANEOUS
  Administered 2021-01-24: 1 [IU] via SUBCUTANEOUS
  Administered 2021-01-25: 7 [IU] via SUBCUTANEOUS
  Administered 2021-01-25 (×2): 3 [IU] via SUBCUTANEOUS
  Administered 2021-01-26: 2 [IU] via SUBCUTANEOUS
  Administered 2021-01-26 (×2): 3 [IU] via SUBCUTANEOUS

## 2021-01-23 MED ORDER — GLIPIZIDE 10 MG PO TABS
10.0000 mg | ORAL_TABLET | Freq: Two times a day (BID) | ORAL | Status: DC
  Filled 2021-01-23: qty 1

## 2021-01-23 MED ORDER — PANTOPRAZOLE SODIUM 40 MG PO TBEC
40.0000 mg | DELAYED_RELEASE_TABLET | Freq: Every day | ORAL | Status: DC
  Administered 2021-01-23 – 2021-02-02 (×11): 40 mg via ORAL
  Filled 2021-01-23 (×11): qty 1

## 2021-01-23 MED ORDER — ENOXAPARIN SODIUM 40 MG/0.4ML IJ SOSY
40.0000 mg | PREFILLED_SYRINGE | INTRAMUSCULAR | Status: DC
  Administered 2021-01-23 – 2021-02-02 (×9): 40 mg via SUBCUTANEOUS
  Filled 2021-01-23 (×9): qty 0.4

## 2021-01-23 MED ORDER — ONDANSETRON HCL 4 MG/2ML IJ SOLN
4.0000 mg | Freq: Four times a day (QID) | INTRAMUSCULAR | Status: DC | PRN
  Administered 2021-01-26: 4 mg via INTRAVENOUS
  Filled 2021-01-23: qty 2

## 2021-01-23 MED ORDER — ROSUVASTATIN CALCIUM 20 MG PO TABS
20.0000 mg | ORAL_TABLET | Freq: Every day | ORAL | Status: DC
  Administered 2021-01-23 – 2021-02-02 (×11): 20 mg via ORAL
  Filled 2021-01-23 (×11): qty 1

## 2021-01-23 MED ORDER — ENSURE ENLIVE PO LIQD
237.0000 mL | Freq: Two times a day (BID) | ORAL | Status: DC
  Administered 2021-01-24: 237 mL via ORAL

## 2021-01-23 MED ORDER — MIDODRINE HCL 5 MG PO TABS
10.0000 mg | ORAL_TABLET | Freq: Three times a day (TID) | ORAL | Status: DC
  Administered 2021-01-23 – 2021-02-02 (×30): 10 mg via ORAL
  Filled 2021-01-23 (×30): qty 2

## 2021-01-23 MED ORDER — DIPHENHYDRAMINE HCL 25 MG PO CAPS
25.0000 mg | ORAL_CAPSULE | Freq: Every day | ORAL | Status: DC
  Administered 2021-01-23 – 2021-02-02 (×11): 25 mg via ORAL
  Filled 2021-01-23 (×11): qty 1

## 2021-01-23 MED ORDER — ASPIRIN 81 MG PO CHEW
81.0000 mg | CHEWABLE_TABLET | Freq: Every day | ORAL | Status: DC
  Administered 2021-01-23 – 2021-02-02 (×11): 81 mg via ORAL
  Filled 2021-01-23 (×11): qty 1

## 2021-01-23 MED ORDER — BUSPIRONE HCL 5 MG PO TABS
10.0000 mg | ORAL_TABLET | Freq: Two times a day (BID) | ORAL | Status: DC
  Administered 2021-01-23 – 2021-02-02 (×21): 10 mg via ORAL
  Filled 2021-01-23 (×6): qty 2
  Filled 2021-01-23: qty 1
  Filled 2021-01-23 (×14): qty 2

## 2021-01-23 NOTE — ED Provider Notes (Signed)
Received call from radiology.  Radiology reports that MRI is consistent with osteomyelitis.  This is an update from preliminary read on MRI.   Frederik Pear A, PA-C 01/23/21 0542    Gilda Crease, MD 01/23/21 [phone]

## 2021-01-23 NOTE — Progress Notes (Signed)
PROGRESS NOTE    [name]  EUM:[id number] DOB: 08-13-1965 DOA: 01/22/2021 PCP: Joice Lofts, NP     Brief Narrative:  [name] is a 55 y.o. male with medical history significant of diabetes, essential hypertension, tobacco abuse, hyperlipidemia, coronary artery disease status post coronary artery bypass grafting x4 performed on 12/27/2020 where the saphenous vein was harvested on his right leg.  Patient has developed infected wound on the site since then.  Patient was seen outpatient by podiatry.  The wound appears to be improving.  It all started that hospitalization when he noticed a small wound on the right fourth toe.  He has been applying Betadine.  Was given a surgical shoe.  In the last week however things have gotten worse.  More discoloration of the right fourth and fifth toes. He has noticed more odor and worsening symptoms in the last 24 hours so he came to the ER.  The toes look gangrenous and the surrounding foot has significant redness swelling and evidence of cellulitis.  MRI confirmed osteomyelitis.   New events last 24 hours / Subjective: Patient's main complaint is pain in his right foot.  Denies any fevers or chills, has some chest soreness from his recent CABG  Assessment & Plan:   Principal Problem:   Cellulitis and abscess of right leg Active Problems:   Coronary artery disease involving native coronary artery of native heart with unstable angina pectoris (HCC)   Diabetes mellitus, type II, insulin dependent (HCC)   Hyperlipidemia associated with type 2 diabetes mellitus (HCC)   Essential hypertension   Tobacco use   S/P CABG x 4   Osteomyelitis and nonhealing wound of the right foot -Continue vancomycin, Zosyn -Podiatry consulted today  Coronary artery disease status post CABG -Continue aspirin.  Stable  Diabetes mellitus, uncontrolled with hyperglycemia, neuropathy -Recent hemoglobin A1c 9.9 -Continue Semglee, sliding scale insulin -Continue  gabapentin  Essential hypertension -Continue metoprolol  Depression, anxiety -Continue BuSpar, Cymbalta, Topamax  Hyperlipidemia -Continue Crestor  Orthostatic hypotension -Continue midodrine   DVT prophylaxis:  enoxaparin (LOVENOX) injection 40 mg Start: 01/23/21 0600  Code Status:     Code Status Orders  (From admission, onward)           Start     Ordered   01/23/21 0320  Full code  Continuous        01/23/21 0319           Code Status History     Date Active Date Inactive Code Status Order ID Comments User Context   12/24/2020 1559 01/05/2021 1857 Full Code [id number]  Kathleene Hazel, MD Inpatient      Family Communication: No family at bedside Disposition Plan:  Status is: Inpatient  Remains inpatient appropriate because:IV treatments appropriate due to intensity of illness or inability to take PO and Inpatient level of care appropriate due to severity of illness  Dispo: The patient is from: Home              Anticipated d/c is to: Home              Patient currently is not medically stable to d/c.   Difficult to place patient No      Consultants:  Podiatry  Procedures:  None  Antimicrobials:  Anti-infectives (From admission, onward)    Start     Dose/Rate Route Frequency Ordered Stop   01/23/21 2045  vancomycin (VANCOREADY) IVPB 1250 mg/250 mL        1,250  mg 166.7 mL/hr over 90 Minutes Intravenous Every 24 hours 01/22/21 2224     01/23/21 0600  piperacillin-tazobactam (ZOSYN) IVPB 3.375 g       See Hyperspace for full Linked Orders Report.   3.375 g 12.5 mL/hr over 240 Minutes Intravenous Every 8 hours 01/22/21 2034     01/22/21 2045  piperacillin-tazobactam (ZOSYN) IVPB 3.375 g       See Hyperspace for full Linked Orders Report.   3.375 g 100 mL/hr over 30 Minutes Intravenous  Once 01/22/21 2034 01/22/21 2118   01/22/21 2045  vancomycin (VANCOREADY) IVPB 1250 mg/250 mL  Status:  Discontinued        1,250 mg 166.7 mL/hr  over 90 Minutes Intravenous  Once 01/22/21 2034 01/22/21 2224        Objective: Vitals:   01/23/21 0545 01/23/21 0615 01/23/21 0915 01/23/21 1000  BP: 102/61 107/67 (!) 95/56 (!) 108/59  Pulse: 96 94 95 93  Resp: 13 16 12 18   Temp:    97.8 F (36.6 C)  TempSrc:    Oral  SpO2: 100% 100% 100% 100%  Weight:      Height:        Intake/Output Summary (Last 24 hours) at 01/23/2021 1315 Last data filed at 01/23/2021 0936 Gross per 24 hour  Intake 650 ml  Output 750 ml  Net -100 ml   Filed Weights   01/22/21 1646  Weight: 55.3 kg    Examination:  General exam: Appears calm and comfortable  Respiratory system: Clear to auscultation. Respiratory effort normal. No respiratory distress. No conversational dyspnea.  Cardiovascular system: S1 & S2 heard, RRR. No murmurs. No pedal edema. Gastrointestinal system: Abdomen is nondistended, soft and nontender. Normal bowel sounds heard. Central nervous system: Alert and oriented. No focal neurological deficits. Speech clear.  Skin:   Psychiatry: Judgement and insight appear normal. Mood & affect appropriate.   Data Reviewed: I have personally reviewed following labs and imaging studies  CBC: Recent Labs  Lab 01/22/21 1655 01/23/21 0343  WBC 11.3* 8.0  NEUTROABS 8.5*  --   HGB 10.7* 11.1*  HCT 32.7* 34.7*  MCV 86.3 86.1  PLT 468* 339   Basic Metabolic Panel: Recent Labs  Lab 01/22/21 1655 01/23/21 0343  NA 135  --   K 4.6  --   CL 100  --   CO2 24  --   GLUCOSE 295*  --   BUN 24*  --   CREATININE 0.95 0.79  CALCIUM 9.9  --    GFR: Estimated Creatinine Clearance: 81.6 mL/min (by C-G formula based on SCr of 0.79 mg/dL). Liver Function Tests: Recent Labs  Lab 01/22/21 1655  AST 17  ALT 18  ALKPHOS 280*  BILITOT 0.3  PROT 7.6  ALBUMIN 3.2*   No results for input(s): LIPASE, AMYLASE in the last 168 hours. No results for input(s): AMMONIA in the last 168 hours. Coagulation Profile: No results for input(s):  INR, PROTIME in the last 168 hours. Cardiac Enzymes: No results for input(s): CKTOTAL, CKMB, CKMBINDEX, TROPONINI in the last 168 hours. BNP (last 3 results) No results for input(s): PROBNP in the last 8760 hours. HbA1C: No results for input(s): HGBA1C in the last 72 hours. CBG: Recent Labs  Lab 01/23/21 1144  GLUCAP 132*   Lipid Profile: No results for input(s): CHOL, HDL, LDLCALC, TRIG, CHOLHDL, LDLDIRECT in the last 72 hours. Thyroid Function Tests: No results for input(s): TSH, T4TOTAL, FREET4, T3FREE, THYROIDAB in the last 72 hours. Anemia  Panel: No results for input(s): VITAMINB12, FOLATE, FERRITIN, TIBC, IRON, RETICCTPCT in the last 72 hours. Sepsis Labs: Recent Labs  Lab 01/22/21 1701 01/22/21 2035  LATICACIDVEN 2.6* 1.8    Recent Results (from the past 240 hour(s))  Blood culture (routine single)     Status: None (Preliminary result)   Collection Time: 01/22/21  5:01 PM   Specimen: BLOOD  Result Value Ref Range Status   Specimen Description BLOOD SITE NOT SPECIFIED  Final   Special Requests   Final    BOTTLES DRAWN AEROBIC AND ANAEROBIC Blood Culture results may not be optimal due to an inadequate volume of blood received in culture bottles   Culture   Final    NO GROWTH < 24 HOURS Performed at [facility], [address], [place], [place] [address]    Report Status PENDING  Incomplete  Resp Panel by RT-PCR (Flu A&B, Covid) Nasopharyngeal Swab     Status: None   Collection Time: 01/22/21  8:28 PM   Specimen: Nasopharyngeal Swab; Nasopharyngeal(NP) swabs in vial transport medium  Result Value Ref Range Status   SARS Coronavirus 2 by RT PCR NEGATIVE NEGATIVE Final    Comment: (NOTE) SARS-CoV-2 target nucleic acids are NOT DETECTED.  The SARS-CoV-2 RNA is generally detectable in upper respiratory specimens during the acute phase of infection. The lowest concentration of SARS-CoV-2 viral copies this assay can detect is 138 copies/mL. A negative result  does not preclude SARS-Cov-2 infection and should not be used as the sole basis for treatment or other patient management decisions. A negative result may occur with  improper specimen collection/handling, submission of specimen other than nasopharyngeal swab, presence of viral mutation(s) within the areas targeted by this assay, and inadequate number of viral copies(<138 copies/mL). A negative result must be combined with clinical observations, patient history, and epidemiological information. The expected result is Negative.  Fact Sheet for Patients:  BloggerCourse.com  Fact Sheet for Healthcare Providers:  SeriousBroker.it  This test is no t yet approved or cleared by the [place] FDA and  has been authorized for detection and/or diagnosis of SARS-CoV-2 by FDA under an Emergency Use Authorization (EUA). This EUA will remain  in effect (meaning this test can be used) for the duration of the COVID-19 declaration under Section 564(b)(1) of the Act, 21 U.S.C.section 360bbb-3(b)(1), unless the authorization is terminated  or revoked sooner.       Influenza A by PCR NEGATIVE NEGATIVE Final   Influenza B by PCR NEGATIVE NEGATIVE Final    Comment: (NOTE) The Xpert Xpress SARS-CoV-2/FLU/RSV plus assay is intended as an aid in the diagnosis of influenza from Nasopharyngeal swab specimens and should not be used as a sole basis for treatment. Nasal washings and aspirates are unacceptable for Xpert Xpress SARS-CoV-2/FLU/RSV testing.  Fact Sheet for Patients: BloggerCourse.com  Fact Sheet for Healthcare Providers: SeriousBroker.it  This test is not yet approved or cleared by the [place] FDA and has been authorized for detection and/or diagnosis of SARS-CoV-2 by FDA under an Emergency Use Authorization (EUA). This EUA will remain in effect (meaning this test can be used) for  the duration of the COVID-19 declaration under Section 564(b)(1) of the Act, 21 U.S.C. section 360bbb-3(b)(1), unless the authorization is terminated or revoked.  Performed at [facility], [address], [place], [place] [address]       Radiology Studies: DG Chest 1 View  Result Date: 01/22/2021 CLINICAL DATA:  Diabetic.  Possible sepsis. EXAM: CHEST  1 VIEW COMPARISON:  None. FINDINGS: Prior CABG. Heart and mediastinal contours are within normal limits. No focal opacities or effusions. No acute bony abnormality. IMPRESSION: No active cardiopulmonary disease. Electronically Signed   By: Charlett NoseKevin  Dover M.D.   On: 01/22/2021 17:56   MR FOOT RIGHT WO CONTRAST  Result Date: 01/23/2021 CLINICAL DATA:  Diabetic with foot swelling laterally. EXAM: MRI OF THE RIGHT FOREFOOT WITHOUT CONTRAST TECHNIQUE: Multiplanar, multisequence MR imaging of the right forefoot was performed. No intravenous contrast was administered. Osteomyelitis protocol MRI of the foot was obtained, to include the entire foot and ankle. This protocol uses a large field of view to cover the entire foot and ankle, and is suitable for assessing bony structures for osteomyelitis. Due to the large field of view and imaging plane choice, this protocol is less sensitive for assessing small structures such as ligamentous structures of the foot and ankle, compared to a dedicated forefoot or dedicated hindfoot exam. COMPARISON:  Radiographs 01/22/2021 FINDINGS: Bones/Joint/Cartilage Mildly accentuated T2 weighted signal in the phalanges of the fourth and fifth toes suspicious for early osteomyelitis. No overt bony destruction. Small degenerative foci of subcortical marrow edema in the navicular. Ligaments Lisfranc ligament appears intact. Muscles and Tendons Diffuse low-grade edema in the regional musculature is likely neurogenic. Soft tissues Ulceration and potentially tissue necrosis dorsally along the fourth and fifth toes. Thinning of the  soft tissues in these from this region. Mild dorsal subcutaneous edema tracking back along the lateral ankle. Cellulitis not excluded. IMPRESSION: 1. Ulceration and thinning of soft tissues along the fourth and fifth toes, with subtle accentuated marrow edema signal in the fourth and fifth phalanges (especially the proximal phalanges) suspicious for early osteomyelitis. 2. Dorsal subcutaneous edema in the forefoot tracking back laterally along the ankle, potentially from cellulitis. Electronically Signed   By: Gaylyn RongWalter  Liebkemann M.D.   On: 01/23/2021 05:17   DG Foot Complete Right  Result Date: 01/22/2021 CLINICAL DATA:  Questionable sepsis. Diabetic. Open wounds over 4th and 5th toes. EXAM: RIGHT FOOT COMPLETE - 3+ VIEW COMPARISON:  01/07/2021 FINDINGS: There is no evidence of fracture or dislocation. There is no evidence of arthropathy or other focal bone abnormality. Soft tissues are unremarkable. No evidence of bone destruction to suggest osteomyelitis. IMPRESSION: Negative. Electronically Signed   By: Charlett NoseKevin  Dover M.D.   On: 01/22/2021 17:56      Scheduled Meds:  aspirin  81 mg Oral Daily   busPIRone  10 mg Oral BID   diphenhydrAMINE  25 mg Oral Daily   DULoxetine  60 mg Oral q AM   enoxaparin (LOVENOX) injection  40 mg Subcutaneous Q24H   ferrous sulfate  325 mg Oral Q breakfast   gabapentin  300 mg Oral TID   insulin aspart  0-9 Units Subcutaneous TID WC   insulin glargine-yfgn  10 Units Subcutaneous Daily   metoprolol tartrate  12.5 mg Oral BID   midodrine  10 mg Oral TID WC   pantoprazole  40 mg Oral Daily   rosuvastatin  20 mg Oral Daily   topiramate  50 mg Oral BID   [START ON 01/25/2021] Vitamin D (Ergocalciferol)  50,000 Units Oral Q Fri   Continuous Infusions:  piperacillin-tazobactam (ZOSYN)  IV Stopped (01/23/21 0936)   vancomycin       LOS: 1 day      Time spent: 30 minutes   Noralee StainJennifer King Pinzon, DO [facility] 01/23/2021, 1:15 PM   Available via Epic secure  chat 7am-7pm After these hours,  please refer to coverage provider listed on [email or web address]

## 2021-01-23 NOTE — Consult Note (Signed)
Reason for Consult: Gangrene of toes of right foot Referring Physician: Noralee Stain, DO  [name] is an 55 y.o. male.  HPI: Patient is a 55 year old male with a history of type 2 diabetes (A1c 9.9%) and CAD with recent quad CABG on 12/27/2020, he noticed a blister forming on the right fourth and fifth toes last Saturday which worsened and turning into black tissue.  Denies fever chills nausea vomiting.  Past Medical History:  Diagnosis Date   Anginal pain (HCC)    Ataxia    CAD (coronary artery disease)    Diabetes mellitus without complication (HCC)    Dizziness    Hyperlipidemia    Hypertension    Near syncope     Past Surgical History:  Procedure Laterality Date   CARDIAC CATHETERIZATION  12/24/2020   CORONARY ARTERY BYPASS GRAFT N/A 12/27/2020   Procedure: CORONARY ARTERY BYPASS GRAFTING (CABG) X FOUR ON PUMP USING LEFT INTERNAL MAMMARY ARTERY AND RIGHT ENDOSCOPIC GREATER SAPHENOUS VEIN CONDUITS;  Surgeon: Corliss Skains, MD;  Location: [facility] OR;  Service: Open Heart Surgery;  Laterality: N/A;   LEFT HEART CATH AND CORONARY ANGIOGRAPHY N/A 12/24/2020   Procedure: LEFT HEART CATH AND CORONARY ANGIOGRAPHY;  Surgeon: Kathleene Hazel, MD;  Location: [facility] INVASIVE CV LAB;  Service: Cardiovascular;  Laterality: N/A;   TEE WITHOUT CARDIOVERSION N/A 12/27/2020   Procedure: TRANSESOPHAGEAL ECHOCARDIOGRAM (TEE);  Surgeon: Corliss Skains, MD;  Location: [facility] OR;  Service: Open Heart Surgery;  Laterality: N/A;    No family history on file.  Social History:  reports that he quit smoking about 4 weeks ago. His smoking use included cigarettes. He has a 40.00 pack-year smoking history. He has never used smokeless tobacco. He reports previous alcohol use. He reports that he does not use drugs.  Allergies:  Allergies  Allergen Reactions   Atorvastatin Other (See Comments)    Myalgias     Medications: I have reviewed the patient's current medications.  Results for  orders placed or performed during the hospital encounter of 01/22/21 (from the past 48 hour(s))  CBC with Differential     Status: Abnormal   Collection Time: 01/22/21  4:55 PM  Result Value Ref Range   WBC 11.3 (H) 4.0 - 10.5 K/uL   RBC 3.79 (L) 4.22 - 5.81 MIL/uL   Hemoglobin 10.7 (L) 13.0 - 17.0 g/dL   HCT 75.1 (L) 02.5 - 85.2 %   MCV 86.3 80.0 - 100.0 fL   MCH 28.2 26.0 - 34.0 pg   MCHC 32.7 30.0 - 36.0 g/dL   RDW 77.8 24.2 - 35.3 %   Platelets 468 (H) 150 - 400 K/uL   nRBC 0.0 0.0 - 0.2 %   Neutrophils Relative % 76 %   Neutro Abs 8.5 (H) 1.7 - 7.7 K/uL   Lymphocytes Relative 15 %   Lymphs Abs 1.7 0.7 - 4.0 K/uL   Monocytes Relative 7 %   Monocytes Absolute 0.8 0.1 - 1.0 K/uL   Eosinophils Relative 1 %   Eosinophils Absolute 0.1 0.0 - 0.5 K/uL   Basophils Relative 1 %   Basophils Absolute 0.1 0.0 - 0.1 K/uL   Immature Granulocytes 0 %   Abs Immature Granulocytes 0.03 0.00 - 0.07 K/uL    Comment: Performed at [facility], [address], [place], [place] [address]  Comprehensive metabolic panel     Status: Abnormal   Collection Time: 01/22/21  4:55 PM  Result Value Ref Range  Sodium 135 135 - 145 mmol/L   Potassium 4.6 3.5 - 5.1 mmol/L   Chloride 100 98 - 111 mmol/L   CO2 24 22 - 32 mmol/L   Glucose, Bld 295 (H) 70 - 99 mg/dL    Comment: Glucose reference range applies only to samples taken after fasting for at least 8 hours.   BUN 24 (H) 6 - 20 mg/dL   Creatinine, Ser 0.37 0.61 - 1.24 mg/dL   Calcium 9.9 8.9 - 04.8 mg/dL   Total Protein 7.6 6.5 - 8.1 g/dL   Albumin 3.2 (L) 3.5 - 5.0 g/dL   AST 17 15 - 41 U/L   ALT 18 0 - 44 U/L   Alkaline Phosphatase 280 (H) 38 - 126 U/L   Total Bilirubin 0.3 0.3 - 1.2 mg/dL   GFR, Estimated >88 >91 mL/min    Comment: (NOTE) Calculated using the CKD-EPI Creatinine Equation (2021)    Anion gap 11 5 - 15    Comment: Performed at [facility], [address], [place], [place] [address]  Lactic acid, plasma      Status: Abnormal   Collection Time: 01/22/21  5:01 PM  Result Value Ref Range   Lactic Acid, Venous 2.6 (HH) 0.5 - 1.9 mmol/L    Comment: CRITICAL RESULT CALLED TO, READ BACK BY AND VERIFIED WITH:  Chuck Hint, RN, 1812, 01/22/21, ADEDOKUNE Performed at [facility], [address] [place], [place] [address]   Blood culture (routine single)     Status: None (Preliminary result)   Collection Time: 01/22/21  5:01 PM   Specimen: BLOOD  Result Value Ref Range   Specimen Description BLOOD SITE NOT SPECIFIED    Special Requests      BOTTLES DRAWN AEROBIC AND ANAEROBIC Blood Culture results may not be optimal due to an inadequate volume of blood received in culture bottles   Culture      NO GROWTH < 24 HOURS Performed at [facility], [address], [place], [place] [address]    Report Status PENDING   Resp Panel by RT-PCR (Flu A&B, Covid) Nasopharyngeal Swab     Status: None   Collection Time: 01/22/21  8:28 PM   Specimen: Nasopharyngeal Swab; Nasopharyngeal(NP) swabs in vial transport medium  Result Value Ref Range   SARS Coronavirus 2 by RT PCR NEGATIVE NEGATIVE    Comment: (NOTE) SARS-CoV-2 target nucleic acids are NOT DETECTED.  The SARS-CoV-2 RNA is generally detectable in upper respiratory specimens during the acute phase of infection. The lowest concentration of SARS-CoV-2 viral copies this assay can detect is 138 copies/mL. A negative result does not preclude SARS-Cov-2 infection and should not be used as the sole basis for treatment or other patient management decisions. A negative result may occur with  improper specimen collection/handling, submission of specimen other than nasopharyngeal swab, presence of viral mutation(s) within the areas targeted by this assay, and inadequate number of viral copies(<138 copies/mL). A negative result must be combined with clinical observations, patient history, and epidemiological information. The expected result is  Negative.  Fact Sheet for Patients:  BloggerCourse.com  Fact Sheet for Healthcare Providers:  SeriousBroker.it  This test is no t yet approved or cleared by the [place] FDA and  has been authorized for detection and/or diagnosis of SARS-CoV-2 by FDA under an Emergency Use Authorization (EUA). This EUA will remain  in effect (meaning this test can be used) for the duration of the COVID-19 declaration under Section  564(b)(1) of the Act, 21 U.S.C.section 360bbb-3(b)(1), unless the authorization is terminated  or revoked sooner.       Influenza A by PCR NEGATIVE NEGATIVE   Influenza B by PCR NEGATIVE NEGATIVE    Comment: (NOTE) The Xpert Xpress SARS-CoV-2/FLU/RSV plus assay is intended as an aid in the diagnosis of influenza from Nasopharyngeal swab specimens and should not be used as a sole basis for treatment. Nasal washings and aspirates are unacceptable for Xpert Xpress SARS-CoV-2/FLU/RSV testing.  Fact Sheet for Patients: BloggerCourse.com  Fact Sheet for Healthcare Providers: SeriousBroker.it  This test is not yet approved or cleared by the [place] FDA and has been authorized for detection and/or diagnosis of SARS-CoV-2 by FDA under an Emergency Use Authorization (EUA). This EUA will remain in effect (meaning this test can be used) for the duration of the COVID-19 declaration under Section 564(b)(1) of the Act, 21 U.S.C. section 360bbb-3(b)(1), unless the authorization is terminated or revoked.  Performed at [facility], [address], [place], [place] [address]   Lactic acid, plasma     Status: None   Collection Time: 01/22/21  8:35 PM  Result Value Ref Range   Lactic Acid, Venous 1.8 0.5 - 1.9 mmol/L    Comment: Performed at [facility], [address] [place], [place] [address]  Urinalysis, Routine w reflex microscopic Urine, Clean Catch      Status: None   Collection Time: 01/23/21  3:43 AM  Result Value Ref Range   Color, Urine YELLOW YELLOW   APPearance CLEAR CLEAR   Specific Gravity, Urine 1.013 1.005 - 1.030   pH 5.0 5.0 - 8.0   Glucose, UA NEGATIVE NEGATIVE mg/dL   Hgb urine dipstick NEGATIVE NEGATIVE   Bilirubin Urine NEGATIVE NEGATIVE   Ketones, ur NEGATIVE NEGATIVE mg/dL   Protein, ur NEGATIVE NEGATIVE mg/dL   Nitrite NEGATIVE NEGATIVE   Leukocytes,Ua NEGATIVE NEGATIVE    Comment: Performed at [facility], [address] [place], [place] [address]  HIV Antibody (routine testing w rflx)     Status: None   Collection Time: 01/23/21  3:43 AM  Result Value Ref Range   HIV Screen 4th Generation wRfx Non Reactive Non Reactive    Comment: Performed at [facility], [address] [place], [place] [address]  CBC     Status: Abnormal   Collection Time: 01/23/21  3:43 AM  Result Value Ref Range   WBC 8.0 4.0 - 10.5 K/uL   RBC 4.03 (L) 4.22 - 5.81 MIL/uL   Hemoglobin 11.1 (L) 13.0 - 17.0 g/dL   HCT 32.9 (L) 92.4 - 26.8 %   MCV 86.1 80.0 - 100.0 fL   MCH 27.5 26.0 - 34.0 pg   MCHC 32.0 30.0 - 36.0 g/dL   RDW 34.1 96.2 - 22.9 %   Platelets 339 150 - 400 K/uL   nRBC 0.0 0.0 - 0.2 %    Comment: Performed at [facility], [address], [place], [place] [address]  Creatinine, serum     Status: None   Collection Time: 01/23/21  3:43 AM  Result Value Ref Range   Creatinine, Ser 0.79 0.61 - 1.24 mg/dL   GFR, Estimated >11 >94 mL/min    Comment: (NOTE) Calculated using the CKD-EPI Creatinine Equation (2021) Performed at [facility], [address], [place], [place] [address]   CBG monitoring, ED     Status: Abnormal   Collection Time: 01/23/21  11:44 AM  Result Value Ref Range   Glucose-Capillary 132 (H) 70 - 99 mg/dL    Comment: Glucose reference range applies only to samples taken after fasting for at least 8 hours.  Glucose, capillary     Status: None   Collection Time: 01/23/21   4:07 PM  Result Value Ref Range   Glucose-Capillary 79 70 - 99 mg/dL    Comment: Glucose reference range applies only to samples taken after fasting for at least 8 hours.    DG Chest 1 View  Result Date: 01/22/2021 CLINICAL DATA:  Diabetic.  Possible sepsis. EXAM: CHEST  1 VIEW COMPARISON:  None. FINDINGS: Prior CABG. Heart and mediastinal contours are within normal limits. No focal opacities or effusions. No acute bony abnormality. IMPRESSION: No active cardiopulmonary disease. Electronically Signed   By: Charlett NoseKevin  Dover M.D.   On: 01/22/2021 17:56   MR FOOT RIGHT WO CONTRAST  Result Date: 01/23/2021 CLINICAL DATA:  Diabetic with foot swelling laterally. EXAM: MRI OF THE RIGHT FOREFOOT WITHOUT CONTRAST TECHNIQUE: Multiplanar, multisequence MR imaging of the right forefoot was performed. No intravenous contrast was administered. Osteomyelitis protocol MRI of the foot was obtained, to include the entire foot and ankle. This protocol uses a large field of view to cover the entire foot and ankle, and is suitable for assessing bony structures for osteomyelitis. Due to the large field of view and imaging plane choice, this protocol is less sensitive for assessing small structures such as ligamentous structures of the foot and ankle, compared to a dedicated forefoot or dedicated hindfoot exam. COMPARISON:  Radiographs 01/22/2021 FINDINGS: Bones/Joint/Cartilage Mildly accentuated T2 weighted signal in the phalanges of the fourth and fifth toes suspicious for early osteomyelitis. No overt bony destruction. Small degenerative foci of subcortical marrow edema in the navicular. Ligaments Lisfranc ligament appears intact. Muscles and Tendons Diffuse low-grade edema in the regional musculature is likely neurogenic. Soft tissues Ulceration and potentially tissue necrosis dorsally along the fourth and fifth toes. Thinning of the soft tissues in these from this region. Mild dorsal subcutaneous edema tracking back along the  lateral ankle. Cellulitis not excluded. IMPRESSION: 1. Ulceration and thinning of soft tissues along the fourth and fifth toes, with subtle accentuated marrow edema signal in the fourth and fifth phalanges (especially the proximal phalanges) suspicious for early osteomyelitis. 2. Dorsal subcutaneous edema in the forefoot tracking back laterally along the ankle, potentially from cellulitis. Electronically Signed   By: Gaylyn RongWalter  Liebkemann M.D.   On: 01/23/2021 05:17   DG Foot Complete Right  Result Date: 01/22/2021 CLINICAL DATA:  Questionable sepsis. Diabetic. Open wounds over 4th and 5th toes. EXAM: RIGHT FOOT COMPLETE - 3+ VIEW COMPARISON:  01/07/2021 FINDINGS: There is no evidence of fracture or dislocation. There is no evidence of arthropathy or other focal bone abnormality. Soft tissues are unremarkable. No evidence of bone destruction to suggest osteomyelitis. IMPRESSION: Negative. Electronically Signed   By: Charlett NoseKevin  Dover M.D.   On: 01/22/2021 17:56    Review of Systems  Musculoskeletal:        Pain in toes of right foot  Skin:        Gangrene toes  All other systems reviewed and are negative. Blood pressure 101/65, pulse 81, temperature 97.7 F (36.5 C), temperature source Oral, resp. rate 14, height 5\' 7"  (1.702 m), weight 55.3 kg, SpO2 100 %.  Vitals:   01/23/21 1800 01/23/21 2005  BP:  101/65  Pulse: 78 81  Resp: 13 14  Temp:  97.7  F (36.5 C)  SpO2: 100% 100%    General AA&O x3. Normal mood and affect.  Vascular Dorsalis pedis and posterior tibial pulses  barely palpable right  Capillary refill normal to all digits. Pedal hair growth diminished.  Neurologic Epicritic sensation grossly  decreased .  Dermatologic (Wound) Gangrene of fourth and fifth toes to the level of the MTPJ well-demarcated  Orthopedic: Motor intact BLE.     Assessment/Plan:  Gangrene of toes of right foot -Imaging: Studies independently reviewed -Antibiotics: Continue broad-spectrum antibiotics for  cellulitis secondary to this is improving -WB Status: WBAT -ABIs ordered -Surgical Plan: pending vascular evaluation but discussed with him that he will at least require fourth and fifth toe amputation with partial ray resection likely.  We also discussed the option of transmetatarsal and the possible eventuality of this.  He understands and we will discuss this further tomorrow  Edwin Cap 01/23/2021, 9:34 PM   Best available via secure chat for questions or concerns.

## 2021-01-23 NOTE — ED Notes (Signed)
Attempted report, nurse unavailable at this time.

## 2021-01-24 ENCOUNTER — Encounter (HOSPITAL_COMMUNITY): Payer: Medicaid Other

## 2021-01-24 DIAGNOSIS — E43 Unspecified severe protein-calorie malnutrition: Secondary | ICD-10-CM | POA: Insufficient documentation

## 2021-01-24 DIAGNOSIS — M869 Osteomyelitis, unspecified: Secondary | ICD-10-CM

## 2021-01-24 DIAGNOSIS — M25774 Osteophyte, right foot: Secondary | ICD-10-CM

## 2021-01-24 LAB — BASIC METABOLIC PANEL
Anion gap: 9 (ref 5–15)
BUN: 17 mg/dL (ref 6–20)
CO2: 23 mmol/L (ref 22–32)
Calcium: 9.2 mg/dL (ref 8.9–10.3)
Chloride: 106 mmol/L (ref 98–111)
Creatinine, Ser: 0.86 mg/dL (ref 0.61–1.24)
GFR, Estimated: >60 mL/min (ref >60)
Glucose, Bld: 145 mg/dL — ABNORMAL HIGH (ref 70–99)
Potassium: 3.5 mmol/L (ref 3.5–5.1)
Sodium: 138 mmol/L (ref 135–145)

## 2021-01-24 LAB — CBC
HCT: 29.3 % — ABNORMAL LOW (ref 39.0–52.0)
Hemoglobin: 9.7 g/dL — ABNORMAL LOW (ref 13.0–17.0)
MCH: 28.1 pg (ref 26.0–34.0)
MCHC: 33.1 g/dL (ref 30.0–36.0)
MCV: 84.9 fL (ref 80.0–100.0)
Platelets: 362 10*3/uL (ref 150–400)
RBC: 3.45 MIL/uL — ABNORMAL LOW (ref 4.22–5.81)
RDW: 13.2 % (ref 11.5–15.5)
WBC: 9.5 10*3/uL (ref 4.0–10.5)
nRBC: 0 % (ref 0.0–0.2)

## 2021-01-24 LAB — GLUCOSE, CAPILLARY
Glucose-Capillary: 137 mg/dL — ABNORMAL HIGH (ref 70–99)
Glucose-Capillary: 138 mg/dL — ABNORMAL HIGH (ref 70–99)
Glucose-Capillary: 191 mg/dL — ABNORMAL HIGH (ref 70–99)

## 2021-01-24 MED ORDER — SODIUM CHLORIDE 0.9 % IV SOLN
INTRAVENOUS | Status: DC | PRN
  Administered 2021-01-24 – 2021-01-30 (×3): 250 mL via INTRAVENOUS

## 2021-01-24 MED ORDER — ADULT MULTIVITAMIN W/MINERALS CH
1.0000 | ORAL_TABLET | Freq: Every day | ORAL | Status: DC
  Administered 2021-01-24 – 2021-02-02 (×10): 1 via ORAL
  Filled 2021-01-24 (×10): qty 1

## 2021-01-24 MED ORDER — BOOST / RESOURCE BREEZE PO LIQD CUSTOM
1.0000 | Freq: Two times a day (BID) | ORAL | Status: DC
  Administered 2021-01-24 – 2021-01-28 (×9): 1 via ORAL

## 2021-01-24 MED ORDER — PROSOURCE PLUS PO LIQD
30.0000 mL | Freq: Two times a day (BID) | ORAL | Status: DC
  Administered 2021-01-24 – 2021-02-02 (×14): 30 mL via ORAL
  Filled 2021-01-24 (×14): qty 30

## 2021-01-24 NOTE — Plan of Care (Signed)
  Problem: Education: Goal: Knowledge of General Education information will improve Description: Including pain rating scale, medication(s)/side effects and non-pharmacologic comfort measures Outcome: Progressing   Problem: Health Behavior/Discharge Planning: Goal: Ability to manage health-related needs will improve Outcome: Progressing   Problem: Clinical Measurements: Goal: Ability to maintain clinical measurements within normal limits will improve Outcome: Progressing Goal: Diagnostic test results will improve Outcome: Progressing Goal: Respiratory complications will improve Outcome: Progressing Goal: Cardiovascular complication will be avoided Outcome: Progressing   Problem: Activity: Goal: Risk for activity intolerance will decrease Outcome: Progressing   Problem: Nutrition: Goal: Adequate nutrition will be maintained Outcome: Progressing   Problem: Coping: Goal: Level of anxiety will decrease Outcome: Progressing   Problem: Elimination: Goal: Will not experience complications related to bowel motility Outcome: Progressing Goal: Will not experience complications related to urinary retention Outcome: Progressing   Problem: Pain Managment: Goal: General experience of comfort will improve Outcome: Progressing   Problem: Safety: Goal: Ability to remain free from injury will improve Outcome: Progressing   

## 2021-01-24 NOTE — Progress Notes (Signed)
Initial Nutrition Assessment  DOCUMENTATION CODES:   Severe malnutrition in context of chronic illness  INTERVENTION:   Liberalize diet to REGULAR to provide more option   Boost Breeze po BID, each supplement provides 250 kcal and 9 grams of protein ProSource Plus 30 ml BID, each supplement provides 100 kcals and 15 grams protein.  MVI with minerals daily   NUTRITION DIAGNOSIS:   Severe Malnutrition related to chronic illness (CAD s/p CABG) as evidenced by severe fat depletion, severe muscle depletion, energy intake < or equal to 75% for > or equal to 1 month, percent weight loss.  GOAL:   Patient will meet greater than or equal to 90% of their needs  MONITOR:   PO intake, Supplement acceptance, Weight trends, Labs, I & O's  REASON FOR ASSESSMENT:   Malnutrition Screening Tool    ASSESSMENT:   Patient with PMH significant for CAD s/p CABG x4 on 12/27/2020, DM, HLD, and HTN. Presents this admission with R foot/leg cellulitis and possible osteomyelitis.  Patient reports having decreased appetite over the last month after undoing CABG. States he consumed two meals daily that consisted of B- bowl of cereal with milk and D- meat with vegetables. Appetite slow to progress this admission. Discussed the importance of protein intake for preservation of lean body mass and to promote wound healing. Patient does not like milky supplements but willing to have Boost Breeze.   Patient endorses a UBW of 125-130 lb and a recent weight loss of 10 lb. Records indicate patient weighed 133 lb on 12/19/2020 and 122 lb this admission (8.3% wt loss in one month, significant for time frame).   UOP: 700 ml x 24 hrs   Medications: SS novolog, semglee, Vitamin D Labs: CBG 79-170  NUTRITION - FOCUSED PHYSICAL EXAM:  Flowsheet Row Most Recent Value  Orbital Region Severe depletion  Upper Arm Region Severe depletion  Thoracic and Lumbar Region Severe depletion  Buccal Region Severe depletion  Temple  Region Severe depletion  Clavicle Bone Region Severe depletion  Clavicle and Acromion Bone Region Severe depletion  Scapular Bone Region Severe depletion  Dorsal Hand Severe depletion  Patellar Region Severe depletion  Anterior Thigh Region Severe depletion  Posterior Calf Region Severe depletion  Edema (RD Assessment) None  Hair Reviewed  Eyes Reviewed  Mouth Reviewed  Skin Reviewed  Nails Reviewed      Diet Order:   Diet Order             Diet Carb Modified Fluid consistency: Thin; Room service appropriate? Yes  Diet effective now                   EDUCATION NEEDS:   Education needs have been addressed  Skin:  Skin Assessment: Skin Integrity Issues: Skin Integrity Issues:: Incisions Incisions: chest, leg  Last BM:  PTA  Height:   Ht Readings from Last 1 Encounters:  01/22/21 5\' 7"  (1.702 m)    Weight:   Wt Readings from Last 1 Encounters:  01/22/21 55.3 kg    BMI:  Body mass index is 19.11 kg/m.  Estimated Nutritional Needs:   Kcal:  1700-1900 kcal  Protein:  85-100 grams  Fluid:  >/= 1.7 L/day  03/24/21 MS, RD, LDN, CNSC Clinical Nutrition Pager listed in AMION

## 2021-01-24 NOTE — Progress Notes (Addendum)
PROGRESS NOTE    [name]  YKD:[id number] DOB: [birth year] DOA: 01/22/2021 PCP: Joice Lofts, NP     Brief Narrative:  [name] is a 55 y.o. male with medical history significant of diabetes, essential hypertension, tobacco abuse, hyperlipidemia, coronary artery disease status post coronary artery bypass grafting x4 performed on 12/27/2020 where the saphenous vein was harvested on his right leg.  Patient has developed infected wound on the site since then.  Patient was seen outpatient by podiatry.  The wound appears to be improving.  It all started that hospitalization when he noticed a small wound on the right fourth toe.  He has been applying Betadine.  Was given a surgical shoe.  In the last week however things have gotten worse.  More discoloration of the right fourth and fifth toes. He has noticed more odor and worsening symptoms in the last 24 hours so he came to the ER.  The toes look gangrenous and the surrounding foot has significant redness swelling and evidence of cellulitis.  MRI confirmed osteomyelitis.   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  Stable  Diabetes mellitus, uncontrolled with hyperglycemia, neuropathy -Recent hemoglobin A1c 9.9 -Continue Semglee, sliding scale insulin -Continue  gabapentin  Essential hypertension -Continue metoprolol  Depression, anxiety -Continue BuSpar, Cymbalta, Topamax  Hyperlipidemia -Continue Crestor  Orthostatic hypotension -Continue midodrine   DVT prophylaxis:  enoxaparin (LOVENOX) injection 40 mg Start: 01/23/21 0600  Code Status:     Code Status Orders  (From admission, onward)           Start     Ordered   01/23/21 0320  Full code  Continuous        01/23/21 0319           Code Status History     Date Active Date Inactive Code Status Order ID Comments User Context   12/24/2020 1559 01/05/2021 1857 Full Code [id number]  Kathleene Hazel, MD Inpatient      Family Communication: No family at bedside, called wife but no answer and voicemail box is full  Disposition Plan:  Status is: Inpatient  Remains inpatient appropriate because:IV treatments appropriate due to intensity of illness or inability to take PO and Inpatient level of care appropriate due to severity of illness  Dispo: The patient is from: Home              Anticipated d/c is to: Home              Patient currently is not medically stable to d/c.   Difficult to place patient No      Consultants:  Podiatry  Procedures:  None  Antimicrobials:  Anti-infectives (From admission, onward)    Start     Dose/Rate Route Frequency Ordered Stop   01/23/21 2045  vancomycin (VANCOREADY) IVPB 1250 mg/250  mL        1,250 mg 166.7 mL/hr over 90 Minutes Intravenous Every 24 hours 01/22/21 2224     01/23/21 0600  piperacillin-tazobactam (ZOSYN) IVPB 3.375 g       See Hyperspace for full Linked Orders Report.   3.375 g 12.5 mL/hr over 240 Minutes Intravenous Every 8 hours 01/22/21 2034     01/22/21 2045  piperacillin-tazobactam (ZOSYN) IVPB 3.375 g       See Hyperspace for full Linked Orders Report.   3.375 g 100 mL/hr over 30 Minutes Intravenous  Once 01/22/21 2034 01/22/21 2118   01/22/21 2045  vancomycin (VANCOREADY) IVPB 1250 mg/250 mL   Status:  Discontinued        1,250 mg 166.7 mL/hr over 90 Minutes Intravenous  Once 01/22/21 2034 01/22/21 2224        Objective: Vitals:   01/24/21 0848 01/24/21 0900 01/24/21 1000 01/24/21 1100  BP: 91/61   102/68  Pulse: 84 84 85 79  Resp: 17 15 14 14   Temp: 97.8 F (36.6 C)   97.7 F (36.5 C)  TempSrc: Axillary   Oral  SpO2: 100% 99% 99% 99%  Weight:      Height:        Intake/Output Summary (Last 24 hours) at 01/24/2021 1218 Last data filed at 01/23/2021 2325 Gross per 24 hour  Intake 44.1 ml  Output 700 ml  Net -655.9 ml    Filed Weights   01/22/21 1646  Weight: 55.3 kg    Examination: General exam: Appears calm and comfortable  Respiratory system: Clear to auscultation. Respiratory effort normal. Cardiovascular system: S1 & S2 heard, RRR. No pedal edema. Gastrointestinal system: Abdomen is nondistended, soft and nontender. Normal bowel sounds heard. Central nervous system: Alert and oriented. Non focal exam. Speech clear  Extremities: Symmetric in appearance bilaterally  Skin: +Gangrenous changes right 4th and 5th toes  Psychiatry: Judgement and insight appear stable. Mood & affect appropriate.     Psychiatry: Judgement and insight appear normal. Mood & affect appropriate.   Data Reviewed: I have personally reviewed following labs and imaging studies  CBC: Recent Labs  Lab 01/22/21 1655 01/23/21 0343 01/24/21 0257  WBC 11.3* 8.0 9.5  NEUTROABS 8.5*  --   --   HGB 10.7* 11.1* 9.7*  HCT 32.7* 34.7* 29.3*  MCV 86.3 86.1 84.9  PLT 468* 339 362    Basic Metabolic Panel: Recent Labs  Lab 01/22/21 1655 01/23/21 0343 01/24/21 0257  NA 135  --  138  K 4.6  --  3.5  CL 100  --  106  CO2 24  --  23  GLUCOSE 295*  --  145*  BUN 24*  --  17  CREATININE 0.95 0.79 0.86  CALCIUM 9.9  --  9.2    GFR: Estimated Creatinine Clearance: 75.9 mL/min (by C-G formula based on SCr of 0.86 mg/dL). Liver Function Tests: Recent Labs  Lab 01/22/21 1655   AST 17  ALT 18  ALKPHOS 280*  BILITOT 0.3  PROT 7.6  ALBUMIN 3.2*    No results for input(s): LIPASE, AMYLASE in the last 168 hours. No results for input(s): AMMONIA in the last 168 hours. Coagulation Profile: No results for input(s): INR, PROTIME in the last 168 hours. Cardiac Enzymes: No results for input(s): CKTOTAL, CKMB, CKMBINDEX, TROPONINI in the last 168 hours. BNP (last 3 results) No results for input(s): PROBNP in the last 8760 hours. HbA1C: No results for input(s): HGBA1C in the last  72 hours. CBG: Recent Labs  Lab 01/23/21 1144 01/23/21 1607 01/23/21 2137 01/24/21 0803 01/24/21 1120  GLUCAP 132* 79 170* 137* 138*    Lipid Profile: No results for input(s): CHOL, HDL, LDLCALC, TRIG, CHOLHDL, LDLDIRECT in the last 72 hours. Thyroid Function Tests: No results for input(s): TSH, T4TOTAL, FREET4, T3FREE, THYROIDAB in the last 72 hours. Anemia Panel: No results for input(s): VITAMINB12, FOLATE, FERRITIN, TIBC, IRON, RETICCTPCT in the last 72 hours. Sepsis Labs: Recent Labs  Lab 01/22/21 1701 01/22/21 2035  LATICACIDVEN 2.6* 1.8     Recent Results (from the past 240 hour(s))  Blood culture (routine single)     Status: None (Preliminary result)   Collection Time: 01/22/21  5:01 PM   Specimen: BLOOD  Result Value Ref Range Status   Specimen Description BLOOD SITE NOT SPECIFIED  Final   Special Requests   Final    BOTTLES DRAWN AEROBIC AND ANAEROBIC Blood Culture results may not be optimal due to an inadequate volume of blood received in culture bottles   Culture   Final    NO GROWTH 2 DAYS Performed at [facility], [address], [place], [place] [address]    Report Status PENDING  Incomplete  Resp Panel by RT-PCR (Flu A&B, Covid) Nasopharyngeal Swab     Status: None   Collection Time: 01/22/21  8:28 PM   Specimen: Nasopharyngeal Swab; Nasopharyngeal(NP) swabs in vial transport medium  Result Value Ref Range Status   SARS Coronavirus 2 by RT  PCR NEGATIVE NEGATIVE Final    Comment: (NOTE) SARS-CoV-2 target nucleic acids are NOT DETECTED.  The SARS-CoV-2 RNA is generally detectable in upper respiratory specimens during the acute phase of infection. The lowest concentration of SARS-CoV-2 viral copies this assay can detect is 138 copies/mL. A negative result does not preclude SARS-Cov-2 infection and should not be used as the sole basis for treatment or other patient management decisions. A negative result may occur with  improper specimen collection/handling, submission of specimen other than nasopharyngeal swab, presence of viral mutation(s) within the areas targeted by this assay, and inadequate number of viral copies(<138 copies/mL). A negative result must be combined with clinical observations, patient history, and epidemiological information. The expected result is Negative.  Fact Sheet for Patients:  BloggerCourse.com  Fact Sheet for Healthcare Providers:  SeriousBroker.it  This test is no t yet approved or cleared by the [place] FDA and  has been authorized for detection and/or diagnosis of SARS-CoV-2 by FDA under an Emergency Use Authorization (EUA). This EUA will remain  in effect (meaning this test can be used) for the duration of the COVID-19 declaration under Section 564(b)(1) of the Act, 21 U.S.C.section 360bbb-3(b)(1), unless the authorization is terminated  or revoked sooner.       Influenza A by PCR NEGATIVE NEGATIVE Final   Influenza B by PCR NEGATIVE NEGATIVE Final    Comment: (NOTE) The Xpert Xpress SARS-CoV-2/FLU/RSV plus assay is intended as an aid in the diagnosis of influenza from Nasopharyngeal swab specimens and should not be used as a sole basis for treatment. Nasal washings and aspirates are unacceptable for Xpert Xpress SARS-CoV-2/FLU/RSV testing.  Fact Sheet for Patients: BloggerCourse.com  Fact Sheet for  Healthcare Providers: SeriousBroker.it  This test is not yet approved or cleared by the [place] FDA and has been authorized for detection and/or diagnosis of SARS-CoV-2 by FDA under an Emergency Use Authorization (EUA). This EUA will remain in effect (meaning this test can be used) for  the duration of the COVID-19 declaration under Section 564(b)(1) of the Act, 21 U.S.C. section 360bbb-3(b)(1), unless the authorization is terminated or revoked.  Performed at [facility], [address] [place], [place] [address]        Radiology Studies: DG Chest 1 View  Result Date: 01/22/2021 CLINICAL DATA:  Diabetic.  Possible sepsis. EXAM: CHEST  1 VIEW COMPARISON:  None. FINDINGS: Prior CABG. Heart and mediastinal contours are within normal limits. No focal opacities or effusions. No acute bony abnormality. IMPRESSION: No active cardiopulmonary disease. Electronically Signed   By: Charlett Nose M.D.   On: 01/22/2021 17:56   MR FOOT RIGHT WO CONTRAST  Result Date: 01/23/2021 CLINICAL DATA:  Diabetic with foot swelling laterally. EXAM: MRI OF THE RIGHT FOREFOOT WITHOUT CONTRAST TECHNIQUE: Multiplanar, multisequence MR imaging of the right forefoot was performed. No intravenous contrast was administered. Osteomyelitis protocol MRI of the foot was obtained, to include the entire foot and ankle. This protocol uses a large field of view to cover the entire foot and ankle, and is suitable for assessing bony structures for osteomyelitis. Due to the large field of view and imaging plane choice, this protocol is less sensitive for assessing small structures such as ligamentous structures of the foot and ankle, compared to a dedicated forefoot or dedicated hindfoot exam. COMPARISON:  Radiographs 01/22/2021 FINDINGS: Bones/Joint/Cartilage Mildly accentuated T2 weighted signal in the phalanges of the fourth and fifth toes suspicious for early osteomyelitis. No overt bony  destruction. Small degenerative foci of subcortical marrow edema in the navicular. Ligaments Lisfranc ligament appears intact. Muscles and Tendons Diffuse low-grade edema in the regional musculature is likely neurogenic. Soft tissues Ulceration and potentially tissue necrosis dorsally along the fourth and fifth toes. Thinning of the soft tissues in these from this region. Mild dorsal subcutaneous edema tracking back along the lateral ankle. Cellulitis not excluded. IMPRESSION: 1. Ulceration and thinning of soft tissues along the fourth and fifth toes, with subtle accentuated marrow edema signal in the fourth and fifth phalanges (especially the proximal phalanges) suspicious for early osteomyelitis. 2. Dorsal subcutaneous edema in the forefoot tracking back laterally along the ankle, potentially from cellulitis. Electronically Signed   By: Gaylyn Rong M.D.   On: 01/23/2021 05:17   DG Foot Complete Right  Result Date: 01/22/2021 CLINICAL DATA:  Questionable sepsis. Diabetic. Open wounds over 4th and 5th toes. EXAM: RIGHT FOOT COMPLETE - 3+ VIEW COMPARISON:  01/07/2021 FINDINGS: There is no evidence of fracture or dislocation. There is no evidence of arthropathy or other focal bone abnormality. Soft tissues are unremarkable. No evidence of bone destruction to suggest osteomyelitis. IMPRESSION: Negative. Electronically Signed   By: Charlett Nose M.D.   On: 01/22/2021 17:56      Scheduled Meds:  aspirin  81 mg Oral Daily   busPIRone  10 mg Oral BID   diphenhydrAMINE  25 mg Oral Daily   DULoxetine  60 mg Oral q AM   enoxaparin (LOVENOX) injection  40 mg Subcutaneous Q24H   feeding supplement  237 mL Oral BID BM   ferrous sulfate  325 mg Oral Q breakfast   gabapentin  300 mg Oral TID   insulin aspart  0-9 Units Subcutaneous TID WC   insulin glargine-yfgn  10 Units Subcutaneous Daily   metoprolol tartrate  12.5 mg Oral BID   midodrine  10 mg Oral TID WC   pantoprazole  40 mg Oral Daily    rosuvastatin  20 mg Oral Daily  topiramate  50 mg Oral BID   [START ON 01/25/2021] Vitamin D (Ergocalciferol)  50,000 Units Oral Q Fri   Continuous Infusions:  sodium chloride 250 mL (01/24/21 0623)   piperacillin-tazobactam (ZOSYN)  IV 3.375 g (01/24/21 [id number])   vancomycin Stopped (01/23/21 2359)     LOS: 2 days      Time spent: 25 minutes   Noralee StainJennifer Tommie Bohlken, DO [facility] 01/24/2021, 12:18 PM   Available via Epic secure chat 7am-7pm After these hours, please refer to coverage provider listed on [email or web address]

## 2021-01-25 ENCOUNTER — Inpatient Hospital Stay (HOSPITAL_COMMUNITY): Payer: Medicaid Other

## 2021-01-25 DIAGNOSIS — E11621 Type 2 diabetes mellitus with foot ulcer: Secondary | ICD-10-CM

## 2021-01-25 DIAGNOSIS — Z794 Long term (current) use of insulin: Secondary | ICD-10-CM

## 2021-01-25 DIAGNOSIS — E7800 Pure hypercholesterolemia, unspecified: Secondary | ICD-10-CM

## 2021-01-25 DIAGNOSIS — I739 Peripheral vascular disease, unspecified: Secondary | ICD-10-CM

## 2021-01-25 DIAGNOSIS — L97509 Non-pressure chronic ulcer of other part of unspecified foot with unspecified severity: Secondary | ICD-10-CM

## 2021-01-25 DIAGNOSIS — I96 Gangrene, not elsewhere classified: Secondary | ICD-10-CM

## 2021-01-25 DIAGNOSIS — Z951 Presence of aortocoronary bypass graft: Secondary | ICD-10-CM

## 2021-01-25 DIAGNOSIS — E119 Type 2 diabetes mellitus without complications: Secondary | ICD-10-CM

## 2021-01-25 DIAGNOSIS — Z87891 Personal history of nicotine dependence: Secondary | ICD-10-CM

## 2021-01-25 LAB — BASIC METABOLIC PANEL
Anion gap: 8 (ref 5–15)
BUN: 16 mg/dL (ref 6–20)
CO2: 25 mmol/L (ref 22–32)
Calcium: 9.3 mg/dL (ref 8.9–10.3)
Chloride: 102 mmol/L (ref 98–111)
Creatinine, Ser: 1.01 mg/dL (ref 0.61–1.24)
GFR, Estimated: >60 mL/min (ref >60)
Glucose, Bld: 295 mg/dL — ABNORMAL HIGH (ref 70–99)
Potassium: 4.1 mmol/L (ref 3.5–5.1)
Sodium: 135 mmol/L (ref 135–145)

## 2021-01-25 LAB — GLUCOSE, CAPILLARY
Glucose-Capillary: 156 mg/dL — ABNORMAL HIGH (ref 70–99)
Glucose-Capillary: 206 mg/dL — ABNORMAL HIGH (ref 70–99)
Glucose-Capillary: 237 mg/dL — ABNORMAL HIGH (ref 70–99)
Glucose-Capillary: 345 mg/dL — ABNORMAL HIGH (ref 70–99)
Glucose-Capillary: 223 mg/dL — ABNORMAL HIGH (ref 70–99)

## 2021-01-25 LAB — COMPREHENSIVE METABOLIC PANEL
ALT: 25 U/L (ref 0–44)
AST: 30 U/L (ref 15–41)
Albumin: 2.7 g/dL — ABNORMAL LOW (ref 3.5–5.0)
Alkaline Phosphatase: 328 U/L — ABNORMAL HIGH (ref 38–126)
Anion gap: 9 (ref 5–15)
BUN: 23 mg/dL — ABNORMAL HIGH (ref 6–20)
CO2: 22 mmol/L (ref 22–32)
Calcium: 9.2 mg/dL (ref 8.9–10.3)
Chloride: 102 mmol/L (ref 98–111)
Creatinine, Ser: 0.8 mg/dL (ref 0.61–1.24)
GFR, Estimated: >60 mL/min (ref >60)
Glucose, Bld: 209 mg/dL — ABNORMAL HIGH (ref 70–99)
Potassium: 4.2 mmol/L (ref 3.5–5.1)
Sodium: 133 mmol/L — ABNORMAL LOW (ref 135–145)
Total Bilirubin: 0.5 mg/dL (ref 0.3–1.2)
Total Protein: 7 g/dL (ref 6.5–8.1)

## 2021-01-25 MED ORDER — INSULIN GLARGINE-YFGN 100 UNIT/ML ~~LOC~~ SOLN
4.0000 [IU] | Freq: Once | SUBCUTANEOUS | Status: AC
  Administered 2021-01-25: 4 [IU] via SUBCUTANEOUS
  Filled 2021-01-25: qty 0.04

## 2021-01-25 MED ORDER — INSULIN GLARGINE-YFGN 100 UNIT/ML ~~LOC~~ SOLN
14.0000 [IU] | Freq: Every day | SUBCUTANEOUS | Status: DC
  Administered 2021-01-26 – 2021-01-27 (×2): 14 [IU] via SUBCUTANEOUS
  Filled 2021-01-25 (×2): qty 0.14

## 2021-01-25 NOTE — Progress Notes (Signed)
ABI has been completed.   Preliminary results in CV Proc.   Blanch Media 01/25/2021 9:09 AM

## 2021-01-25 NOTE — Consult Note (Signed)
[facility] [place]  Patient name: [name] MRN: [id number] DOB: 04/19/1966 Sex: male   REQUESTING PROVIDER:   Hospital service   REASON FOR CONSULT:    PAD with gangrene of the right fourth and fifth toe  HISTORY OF PRESENT ILLNESS:   [name] is a 55 y.o. male, who was admitted on 01/22/2021 with wounds on his right fourth and fifth toe.  He has been followed by podiatry for this however the symptoms have progressed, and he now has redness and swelling on the foot.  He had ABIs performed which showed suboptimal blood flow to the foot and so we were consulted.  The patient is recently status post CABG x4 on 12/27/2020.  He is still recovering from this.  He is a diabetic.  He is medically managed for hypertension.  He has a history of tobacco abuse.  Is on a statin for hypercholesterolemia.  PAST MEDICAL HISTORY    Past Medical History:  Diagnosis Date   Anginal pain (HCC)    Ataxia    CAD (coronary artery disease)    Diabetes mellitus without complication (HCC)    Dizziness    Hyperlipidemia    Hypertension    Near syncope      FAMILY HISTORY   No family history on file.  SOCIAL HISTORY:   Social History   Socioeconomic History   Marital status: Married    Spouse name: Not on file   Number of children: Not on file   Years of education: Not on file   Highest education level: Not on file  Occupational History   Not on file  Tobacco Use   Smoking status: Former    Packs/day: 1.00    Years: 40.00    Pack years: 40.00    Types: Cigarettes    Quit date: 12/24/2020    Years since quitting: 0.0   Smokeless tobacco: Never  Vaping Use   Vaping Use: Never used  Substance and Sexual Activity   Alcohol use: Not Currently   Drug use: Never   Sexual activity: Not on file  Other Topics Concern   Not on file  Social History Narrative   Lives with wife   2-3 cps of caffeine    Social  Determinants of Health   Financial Resource Strain: Not on file  Food Insecurity: Not on file  Transportation Needs: Not on file  Physical Activity: Not on file  Stress: Not on file  Social Connections: Not on file  Intimate Partner Violence: Not on file    ALLERGIES:    Allergies  Allergen Reactions   Atorvastatin Other (See Comments)    Myalgias     CURRENT MEDICATIONS:    Current Facility-Administered Medications  Medication Dose Route Frequency Provider Last Rate Last Admin   (feeding supplement) PROSource Plus liquid 30 mL  30 mL Oral BID BM Noralee Stain, DO   30 mL at 01/25/21 1420   0.9 %  sodium chloride infusion   Intravenous PRN Rometta Emery, MD   Stopped at 01/24/21 1436   acetaminophen (TYLENOL) tablet 1,000 mg  1,000 mg Oral Q6H PRN Rometta Emery, MD   1,000 mg at 01/24/21 1623   aspirin chewable tablet 81 mg  81 mg Oral Daily Earlie Lou L, MD   81 mg at 01/25/21 0947   busPIRone (BUSPAR) tablet 10 mg  10 mg Oral BID Rometta Emery, MD   10 mg at 01/25/21 [phone]  diphenhydrAMINE (BENADRYL) capsule 25 mg  25 mg Oral Daily Earlie Lou L, MD   25 mg at 01/25/21 0948   DULoxetine (CYMBALTA) DR capsule 60 mg  60 mg Oral q AM Rometta Emery, MD   60 mg at 01/25/21 1138   enoxaparin (LOVENOX) injection 40 mg  40 mg Subcutaneous Q24H Earlie Lou L, MD   40 mg at 01/25/21 0448   feeding supplement (BOOST / RESOURCE BREEZE) liquid 1 Container  1 Container Oral BID BM Noralee Stain, DO   1 Container at 01/25/21 1420   ferrous sulfate tablet 325 mg  325 mg Oral Q breakfast Rometta Emery, MD   325 mg at 01/25/21 0948   gabapentin (NEURONTIN) capsule 300 mg  300 mg Oral TID Rometta Emery, MD   300 mg at 01/25/21 1642   insulin aspart (novoLOG) injection 0-9 Units  0-9 Units Subcutaneous TID WC Noralee Stain, DO   7 Units at 01/25/21 1642   [START ON 01/26/2021] insulin glargine-yfgn (SEMGLEE) injection 14 Units  14 Units Subcutaneous Daily  Elgergawy, Leana Roe, MD       metoprolol tartrate (LOPRESSOR) tablet 12.5 mg  12.5 mg Oral BID Earlie Lou L, MD   12.5 mg at 01/25/21 0947   midodrine (PROAMATINE) tablet 10 mg  10 mg Oral TID WC Earlie Lou L, MD   10 mg at 01/25/21 1642   morphine 2 MG/ML injection 2 mg  2 mg Intravenous Q2H PRN Rometta Emery, MD   2 mg at 01/25/21 1146   multivitamin with minerals tablet 1 tablet  1 tablet Oral Daily Noralee Stain, DO   1 tablet at 01/25/21 0947   ondansetron (ZOFRAN) tablet 4 mg  4 mg Oral Q6H PRN Rometta Emery, MD       Or   ondansetron (ZOFRAN) injection 4 mg  4 mg Intravenous Q6H PRN Rometta Emery, MD       pantoprazole (PROTONIX) EC tablet 40 mg  40 mg Oral Daily Earlie Lou L, MD   40 mg at 01/25/21 0947   piperacillin-tazobactam (ZOSYN) IVPB 3.375 g  3.375 g Intravenous Q8H Cathie Hoops, RPH 12.5 mL/hr at 01/25/21 1424 3.375 g at 01/25/21 1424   rosuvastatin (CRESTOR) tablet 20 mg  20 mg Oral Daily Rometta Emery, MD   20 mg at 01/25/21 0948   topiramate (TOPAMAX) tablet 50 mg  50 mg Oral BID Rometta Emery, MD   50 mg at 01/25/21 0947   vancomycin (VANCOREADY) IVPB 1250 mg/250 mL  1,250 mg Intravenous Q24H Cathie Hoops, RPH 166.7 mL/hr at 01/25/21 2138 1,250 mg at 01/25/21 2138   Vitamin D (Ergocalciferol) (DRISDOL) capsule 50,000 Units  50,000 Units Oral Q Dayna Ramus, MD   50,000 Units at 01/25/21 1421    REVIEW OF SYSTEMS:   [X]  denotes positive finding, [ ]  denotes negative finding Cardiac  Comments:  Chest pain or chest pressure:    Shortness of breath upon exertion:    Short of breath when lying flat:    Irregular heart rhythm:        Vascular    Pain in calf, thigh, or hip brought on by ambulation:    Pain in feet at night that wakes you up from your sleep:     Blood clot in your veins:    Leg swelling:         Pulmonary    Oxygen at home:    Productive  cough:     Wheezing:         Neurologic    Sudden  weakness in arms or legs:     Sudden numbness in arms or legs:     Sudden onset of difficulty speaking or slurred speech:    Temporary loss of vision in one eye:     Problems with dizziness:         Gastrointestinal    Blood in stool:      Vomited blood:         Genitourinary    Burning when urinating:     Blood in urine:        Psychiatric    Major depression:         Hematologic    Bleeding problems:    Problems with blood clotting too easily:        Skin    Rashes or ulcers: x       Constitutional    Fever or chills:     PHYSICAL EXAM:   Vitals:   01/25/21 1147 01/25/21 1413 01/25/21 1645 01/25/21 2032  BP: 112/65 (!) 93/55 (!) 103/55   Pulse: 85 85 93   Resp: 18 18 14    Temp: 97.9 F (36.6 C) 97.9 F (36.6 C)  97.8 F (36.6 C)  TempSrc: Oral Oral    SpO2: 98% 100% 99%   Weight:      Height:        GENERAL: The patient is a well-nourished male, in no acute distress. The vital signs are documented above. CARDIAC: There is a regular rate and rhythm.  VASCULAR: Palpable femoral pulses bilaterally.  Nonpalpable pedal pulses PULMONARY: Nonlabored respirations ABDOMEN: Soft and non-tender \ MUSCULOSKELETAL: There are no major deformities or cyanosis. NEUROLOGIC: No focal weakness or paresthesias are detected. SKIN: See photo below PSYCHIATRIC: The patient has a normal affect.     STUDIES:   I have reviewed his vascular lab studies with the following findings: ABI/TBIToday's ABIToday's TBIPrevious ABIPrevious TBI  +-------+-----------+-----------+------------+------------+  Right  0.61       0.06                                 +-------+-----------+-----------+------------+------------+  Left   1.04       0.85                                 +-------+-----------+-----------+------------+------------+   ASSESSMENT and PLAN   Diabetic foot ulcer in the setting of PAD: I discussed with the patient and his wife who was present at the  bedside that he has nonsalvageable right fourth and fifth toes.  His ultrasound today shows significant peripheral vascular disease.  Without revascularization I doubt he would be able to heal his amputation site.  I have therefore recommended proceeding with angiogram to better define his anatomy and proceed with revascularization prior to his toe amputations.  I discussed the details of the procedure and they wish to proceed.  I anticipate doing his arteriogram either Monday or Tuesday.   Wednesday, MD, FACS [facility] [phone] Pager [phone]

## 2021-01-25 NOTE — Progress Notes (Addendum)
PROGRESS NOTE    [name]  ZOX:[id number] DOB: [birth year] DOA: 01/22/2021 PCP: [name], [name], NP     Brief Narrative:  [name] is a 55 y.o. male with medical history significant of diabetes, essential hypertension, tobacco abuse, hyperlipidemia, coronary artery disease status post coronary artery bypass grafting x4 performed on 12/27/2020 where the saphenous vein was harvested on his right leg.  Patient has developed infected wound on the site since then.  Patient was seen outpatient by podiatry.  The wound appears to be improving.  It all started that hospitalization when he noticed a small wound on the right fourth toe.  He has been applying Betadine.  Was given a surgical shoe.  In the last week however things have gotten worse.  More discoloration of the right fourth and fifth toes. He has noticed more odor and worsening symptoms in the last 24 hours so he came to the ER.  The toes look gangrenous and the surrounding foot has significant redness swelling and evidence of cellulitis.  MRI confirmed osteomyelitis.   New events last 24 hours / Subjective: Reports right foot pain is controlled, otherwise denies any complaints.  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) Active Problems:   Coronary artery disease involving native coronary artery of native heart with unstable angina pectoris (HCC)   Diabetes mellitus, type II, insulin dependent (HCC)   Hyperlipidemia associated with type 2 diabetes mellitus (HCC)   Essential hypertension   Tobacco use   S/P CABG x 4   Dry gangrene (HCC)   Protein-calorie malnutrition, severe   Osteomyelitis and nonhealing wound of the right foot -Continue vancomycin, Zosyn -Podiatry following -VIU with evidence of peripheral vascular disease, discussed with podiatry, requesting vascular input likely patient will need revascularization before amputation.  Coronary artery disease status post  CABG -Continue aspirin.  Stable  Diabetes mellitus, uncontrolled with hyperglycemia, neuropathy -Recent hemoglobin A1c 9.9 -Continue Semglee, CBG poorly controlled, will increase Semglee to 14 units, continue with sliding scale insulin -Continue gabapentin  Essential hypertension -Continue metoprolol  Depression, anxiety -Continue BuSpar, Cymbalta, Topamax  Hyperlipidemia -Continue Crestor  Orthostatic hypotension -Continue midodrine   DVT prophylaxis:  enoxaparin (LOVENOX) injection 40 mg Start: 01/23/21 0600  Code Status:     Code Status Orders  (From admission, onward)           Start     Ordered   01/23/21 0320  Full code  Continuous        01/23/21 0319           Code Status History     Date Active Date Inactive Code Status Order ID Comments User Context   12/24/2020 1559 01/05/2021 1857 Full Code [id number]  Kathleene HazelMcAlhany, Christopher D, MD Inpatient      Family Communication: No family at bedside,   Disposition Plan:  Status is: Inpatient  Remains inpatient appropriate because:IV treatments appropriate due to intensity of illness or inability to take PO and Inpatient level of care appropriate due to severity of illness  Dispo: The patient is from: Home              Anticipated d/c is to: Home              Patient currently is not medically stable to d/c.   Difficult to place patient No      Consultants:  Podiatry vascular  Procedures:  None  Antimicrobials:  Anti-infectives (From admission, onward)  Start     Dose/Rate Route Frequency Ordered Stop   01/23/21 2045  vancomycin (VANCOREADY) IVPB 1250 mg/250 mL        1,250 mg 166.7 mL/hr over 90 Minutes Intravenous Every 24 hours 01/22/21 2224     01/23/21 0600  piperacillin-tazobactam (ZOSYN) IVPB 3.375 g       See Hyperspace for full Linked Orders Report.   3.375 g 12.5 mL/hr over 240 Minutes Intravenous Every 8 hours 01/22/21 2034     01/22/21 2045  piperacillin-tazobactam (ZOSYN)  IVPB 3.375 g       See Hyperspace for full Linked Orders Report.   3.375 g 100 mL/hr over 30 Minutes Intravenous  Once 01/22/21 2034 01/22/21 2118   01/22/21 2045  vancomycin (VANCOREADY) IVPB 1250 mg/250 mL  Status:  Discontinued        1,250 mg 166.7 mL/hr over 90 Minutes Intravenous  Once 01/22/21 2034 01/22/21 2224        Objective: Vitals:   01/25/21 0751 01/25/21 0947 01/25/21 1147 01/25/21 1413  BP: (!) 92/58 (!) 106/57 112/65 (!) 93/55  Pulse: 84 85 85 85  Resp: 14  18 18   Temp: 97.7 F (36.5 C)  97.9 F (36.6 C) 97.9 F (36.6 C)  TempSrc: Oral  Oral Oral  SpO2: 98%  98% 100%  Weight:      Height:        Intake/Output Summary (Last 24 hours) at 01/25/2021 1440 Last data filed at 01/25/2021 1300 Gross per 24 hour  Intake 1297.93 ml  Output 360 ml  Net 937.93 ml   Filed Weights   01/22/21 1646  Weight: 55.3 kg    Examination:  Awake Alert, Oriented X 3, No new F.N deficits, Normal affect Symmetrical Chest wall movement, Good air movement bilaterally, CTAB RRR,No Gallops,Rubs or new Murmurs, No Parasternal Heave +ve B.Sounds, Abd Soft, No tenderness, No rebound - guarding or rigidity. Gangrene in right fourth, fifth toe     Psychiatry: Judgement and insight appear normal. Mood & affect appropriate.   Data Reviewed: I have personally reviewed following labs and imaging studies  CBC: Recent Labs  Lab 01/22/21 1655 01/23/21 0343 01/24/21 0257  WBC 11.3* 8.0 9.5  NEUTROABS 8.5*  --   --   HGB 10.7* 11.1* 9.7*  HCT 32.7* 34.7* 29.3*  MCV 86.3 86.1 84.9  PLT 468* 339 362   Basic Metabolic Panel: Recent Labs  Lab 01/22/21 1655 01/23/21 0343 01/24/21 0257 01/25/21 0217  NA 135  --  138 135  K 4.6  --  3.5 4.1  CL 100  --  106 102  CO2 24  --  23 25  GLUCOSE 295*  --  145* 295*  BUN 24*  --  17 16  CREATININE 0.95 0.79 0.86 1.01  CALCIUM 9.9  --  9.2 9.3   GFR: Estimated Creatinine Clearance: 64.6 mL/min (by C-G formula based on SCr of  1.01 mg/dL). Liver Function Tests: Recent Labs  Lab 01/22/21 1655  AST 17  ALT 18  ALKPHOS 280*  BILITOT 0.3  PROT 7.6  ALBUMIN 3.2*   No results for input(s): LIPASE, AMYLASE in the last 168 hours. No results for input(s): AMMONIA in the last 168 hours. Coagulation Profile: No results for input(s): INR, PROTIME in the last 168 hours. Cardiac Enzymes: No results for input(s): CKTOTAL, CKMB, CKMBINDEX, TROPONINI in the last 168 hours. BNP (last 3 results) No results for input(s): PROBNP in the last 8760 hours. HbA1C: No results for  input(s): HGBA1C in the last 72 hours. CBG: Recent Labs  Lab 01/24/21 1120 01/24/21 1513 01/24/21 2057 01/25/21 0803 01/25/21 1119  GLUCAP 138* 191* 156* 206* 237*   Lipid Profile: No results for input(s): CHOL, HDL, LDLCALC, TRIG, CHOLHDL, LDLDIRECT in the last 72 hours. Thyroid Function Tests: No results for input(s): TSH, T4TOTAL, FREET4, T3FREE, THYROIDAB in the last 72 hours. Anemia Panel: No results for input(s): VITAMINB12, FOLATE, FERRITIN, TIBC, IRON, RETICCTPCT in the last 72 hours. Sepsis Labs: Recent Labs  Lab 01/22/21 1701 01/22/21 2035  LATICACIDVEN 2.6* 1.8    Recent Results (from the past 240 hour(s))  Blood culture (routine single)     Status: None (Preliminary result)   Collection Time: 01/22/21  5:01 PM   Specimen: BLOOD  Result Value Ref Range Status   Specimen Description BLOOD SITE NOT SPECIFIED  Final   Special Requests   Final    BOTTLES DRAWN AEROBIC AND ANAEROBIC Blood Culture results may not be optimal due to an inadequate volume of blood received in culture bottles   Culture   Final    NO GROWTH 2 DAYS Performed at [facility], [address], [place], [place] [address]    Report Status PENDING  Incomplete  Resp Panel by RT-PCR (Flu A&B, Covid) Nasopharyngeal Swab     Status: None   Collection Time: 01/22/21  8:28 PM   Specimen: Nasopharyngeal Swab; Nasopharyngeal(NP) swabs in vial transport  medium  Result Value Ref Range Status   SARS Coronavirus 2 by RT PCR NEGATIVE NEGATIVE Final    Comment: (NOTE) SARS-CoV-2 target nucleic acids are NOT DETECTED.  The SARS-CoV-2 RNA is generally detectable in upper respiratory specimens during the acute phase of infection. The lowest concentration of SARS-CoV-2 viral copies this assay can detect is 138 copies/mL. A negative result does not preclude SARS-Cov-2 infection and should not be used as the sole basis for treatment or other patient management decisions. A negative result may occur with  improper specimen collection/handling, submission of specimen other than nasopharyngeal swab, presence of viral mutation(s) within the areas targeted by this assay, and inadequate number of viral copies(<138 copies/mL). A negative result must be combined with clinical observations, patient history, and epidemiological information. The expected result is Negative.  Fact Sheet for Patients:  BloggerCourse.com  Fact Sheet for Healthcare Providers:  SeriousBroker.it  This test is no t yet approved or cleared by the [place] FDA and  has been authorized for detection and/or diagnosis of SARS-CoV-2 by FDA under an Emergency Use Authorization (EUA). This EUA will remain  in effect (meaning this test can be used) for the duration of the COVID-19 declaration under Section 564(b)(1) of the Act, 21 U.S.C.section 360bbb-3(b)(1), unless the authorization is terminated  or revoked sooner.       Influenza A by PCR NEGATIVE NEGATIVE Final   Influenza B by PCR NEGATIVE NEGATIVE Final    Comment: (NOTE) The Xpert Xpress SARS-CoV-2/FLU/RSV plus assay is intended as an aid in the diagnosis of influenza from Nasopharyngeal swab specimens and should not be used as a sole basis for treatment. Nasal washings and aspirates are unacceptable for Xpert Xpress SARS-CoV-2/FLU/RSV testing.  Fact Sheet for  Patients: BloggerCourse.com  Fact Sheet for Healthcare Providers: SeriousBroker.it  This test is not yet approved or cleared by the [place] FDA and has been authorized for detection and/or diagnosis of SARS-CoV-2 by FDA under an Emergency Use Authorization (EUA). This EUA will remain in effect (meaning this test can  be used) for the duration of the COVID-19 declaration under Section 564(b)(1) of the Act, 21 U.S.C. section 360bbb-3(b)(1), unless the authorization is terminated or revoked.  Performed at [facility], [address] [place], [place] [address]       Radiology Studies: VAS [place] ABI WITH/WO TBI  Result Date: 01/25/2021  LOWER EXTREMITY DOPPLER STUDY Patient Name:  [name]  Date of Exam:   01/25/2021 Medical Rec #: [id number]       Accession #:    [id number] Date of Birth: 02-21-1966      Patient Gender: M Patient Age:   55 years Exam Location:  [facility] Procedure:      VAS [place] ABI WITH/WO TBI Referring Phys: Lovel Suazo [facility] --------------------------------------------------------------------------------  Indications: Gangrene. High Risk         Hypertension, hyperlipidemia, Diabetes, coronary artery Factors:          disease.  Comparison Study: 12/24/20 prior Performing Technologist: [place] Ponder RVS  Examination Guidelines: A complete evaluation includes at minimum, Doppler waveform signals and systolic blood pressure reading at the level of bilateral brachial, anterior tibial, and posterior tibial arteries, when vessel segments are accessible. Bilateral testing is considered an integral part of a complete examination. Photoelectric Plethysmograph (PPG) waveforms and toe systolic pressure readings are included as required and additional duplex testing as needed. Limited examinations for reoccurring indications may be performed as noted.  ABI Findings:  +---------+------------------+-----+-------------------+--------+ Right    Rt Pressure (mmHg)IndexWaveform           Comment  +---------+------------------+-----+-------------------+--------+ Brachial 107                    triphasic                   +---------+------------------+-----+-------------------+--------+ PTA                             absent                      +---------+------------------+-----+-------------------+--------+ DP       66                0.61 dampened monophasic         +---------+------------------+-----+-------------------+--------+ Great Toe9                 0.08 Abnormal                    +---------+------------------+-----+-------------------+--------+ +---------+------------------+-----+----------+-------+ Left     Lt Pressure (mmHg)IndexWaveform  Comment +---------+------------------+-----+----------+-------+ Brachial 109                    triphasic         +---------+------------------+-----+----------+-------+ PTA      113               1.04 monophasic        +---------+------------------+-----+----------+-------+ DP       107               0.98 monophasic        +---------+------------------+-----+----------+-------+ Great Toe93                0.85 Normal            +---------+------------------+-----+----------+-------+ +-------+-----------+-----------+------------+------------+ ABI/TBIToday's ABIToday's TBIPrevious ABIPrevious TBI +-------+-----------+-----------+------------+------------+ Right  0.61       0.06                                +-------+-----------+-----------+------------+------------+  Left   1.04       0.85                                +-------+-----------+-----------+------------+------------+  Arterial wall calcification precludes accurate ankle pressures and ABIs.  Summary: Right: Resting right ankle-brachial index indicates moderate right lower extremity arterial disease.  The right toe-brachial index is abnormal. Left: Resting left ankle-brachial index is within normal range. No evidence of significant left lower extremity arterial disease.  *See table(s) above for measurements and observations.     Preliminary       Scheduled Meds:  (feeding supplement) PROSource Plus  30 mL Oral BID BM   aspirin  81 mg Oral Daily   busPIRone  10 mg Oral BID   diphenhydrAMINE  25 mg Oral Daily   DULoxetine  60 mg Oral q AM   enoxaparin (LOVENOX) injection  40 mg Subcutaneous Q24H   feeding supplement  1 Container Oral BID BM   ferrous sulfate  325 mg Oral Q breakfast   gabapentin  300 mg Oral TID   insulin aspart  0-9 Units Subcutaneous TID WC   insulin glargine-yfgn  10 Units Subcutaneous Daily   metoprolol tartrate  12.5 mg Oral BID   midodrine  10 mg Oral TID WC   multivitamin with minerals  1 tablet Oral Daily   pantoprazole  40 mg Oral Daily   rosuvastatin  20 mg Oral Daily   topiramate  50 mg Oral BID   Vitamin D (Ergocalciferol)  50,000 Units Oral Q Fri   Continuous Infusions:  sodium chloride Stopped (01/24/21 1436)   piperacillin-tazobactam (ZOSYN)  IV 3.375 g (01/25/21 1424)   vancomycin 1,250 mg (01/24/21 2213)     LOS: 3 days      Huey Bienenstock, MD [facility] 01/25/2021, 2:40 PM   Available via Epic secure chat 7am-7pm After these hours, please refer to coverage provider listed on [email or web address]

## 2021-01-25 NOTE — Progress Notes (Addendum)
Pharmacy Antibiotic Note  [name] is a 55 y.o. male admitted on 01/22/2021 with  wound infection .  Pharmacy has been consulted for vancomycin and zosyn dosing.  He continues on antibiotics (day 3) for R foot osteomyelitis and plans for toe amputation. -blood cultures- ngtd   Plan: Zosyn 3.375g IV q8h (4 hour infusion). Vancomycin 1250 mg q24hr  Monitor renal function Obtain vancomycin levels as indicated F/u cultures Will follow surgery plans  Height: 5\' 7"  (170.2 cm) Weight: 55.3 kg (122 lb) IBW/kg (Calculated) : 66.1  Temp (24hrs), Avg:98.1 F (36.7 C), Min:97.7 F (36.5 C), Max:98.8 F (37.1 C)  Recent Labs  Lab 01/22/21 1655 01/22/21 1701 01/22/21 2035 01/23/21 0343 01/24/21 0257 01/25/21 0217  WBC 11.3*  --   --  8.0 9.5  --   CREATININE 0.95  --   --  0.79 0.86 1.01  LATICACIDVEN  --  2.6* 1.8  --   --   --      Estimated Creatinine Clearance: 64.6 mL/min (by C-G formula based on SCr of 1.01 mg/dL).    Allergies  Allergen Reactions   Atorvastatin Other (See Comments)    Myalgias    Antimicrobials this admission: zosyn 8/9 >>  vancomycin 8/9 >>   Microbiology results: Blood- ngtd  Thank you for allowing pharmacy to be a part of this patient's care.  10/9, PharmD Clinical Pharmacist **Pharmacist phone directory can now be found on [email or web address] ([email or web address]).  Listed under [facility].

## 2021-01-26 DIAGNOSIS — M869 Osteomyelitis, unspecified: Secondary | ICD-10-CM | POA: Diagnosis not present

## 2021-01-26 DIAGNOSIS — I96 Gangrene, not elsewhere classified: Secondary | ICD-10-CM | POA: Diagnosis not present

## 2021-01-26 LAB — CBC
HCT: 30.8 % — ABNORMAL LOW (ref 39.0–52.0)
Hemoglobin: 9.9 g/dL — ABNORMAL LOW (ref 13.0–17.0)
MCH: 27.7 pg (ref 26.0–34.0)
MCHC: 32.1 g/dL (ref 30.0–36.0)
MCV: 86.3 fL (ref 80.0–100.0)
Platelets: 366 10*3/uL (ref 150–400)
RBC: 3.57 MIL/uL — ABNORMAL LOW (ref 4.22–5.81)
RDW: 13.2 % (ref 11.5–15.5)
WBC: 12.5 10*3/uL — ABNORMAL HIGH (ref 4.0–10.5)
nRBC: 0 % (ref 0.0–0.2)

## 2021-01-26 LAB — BASIC METABOLIC PANEL
Anion gap: 10 (ref 5–15)
BUN: 19 mg/dL (ref 6–20)
CO2: 23 mmol/L (ref 22–32)
Calcium: 9.5 mg/dL (ref 8.9–10.3)
Chloride: 104 mmol/L (ref 98–111)
Creatinine, Ser: 0.87 mg/dL (ref 0.61–1.24)
GFR, Estimated: >60 mL/min (ref >60)
Glucose, Bld: 206 mg/dL — ABNORMAL HIGH (ref 70–99)
Potassium: 4.1 mmol/L (ref 3.5–5.1)
Sodium: 137 mmol/L (ref 135–145)

## 2021-01-26 LAB — GLUCOSE, CAPILLARY
Glucose-Capillary: 196 mg/dL — ABNORMAL HIGH (ref 70–99)
Glucose-Capillary: 217 mg/dL — ABNORMAL HIGH (ref 70–99)
Glucose-Capillary: 208 mg/dL — ABNORMAL HIGH (ref 70–99)
Glucose-Capillary: 310 mg/dL — ABNORMAL HIGH (ref 70–99)

## 2021-01-26 MED ORDER — INSULIN ASPART 100 UNIT/ML IJ SOLN
0.0000 [IU] | Freq: Every day | INTRAMUSCULAR | Status: DC
  Administered 2021-01-26: 4 [IU] via SUBCUTANEOUS
  Administered 2021-01-27: 3 [IU] via SUBCUTANEOUS
  Administered 2021-01-28 – 2021-01-30 (×2): 2 [IU] via SUBCUTANEOUS
  Administered 2021-02-01: 3 [IU] via SUBCUTANEOUS

## 2021-01-26 MED ORDER — HYDROCODONE-ACETAMINOPHEN 5-325 MG PO TABS
1.0000 | ORAL_TABLET | Freq: Four times a day (QID) | ORAL | Status: DC | PRN
Start: 2021-01-26 — End: 2021-02-02
  Administered 2021-01-26 – 2021-02-02 (×12): 1 via ORAL
  Filled 2021-01-26 (×13): qty 1

## 2021-01-26 MED ORDER — INSULIN ASPART 100 UNIT/ML IJ SOLN
0.0000 [IU] | Freq: Three times a day (TID) | INTRAMUSCULAR | Status: DC
  Administered 2021-01-27: 7 [IU] via SUBCUTANEOUS
  Administered 2021-01-27: 5 [IU] via SUBCUTANEOUS
  Administered 2021-01-27: 3 [IU] via SUBCUTANEOUS
  Administered 2021-01-28: 9 [IU] via SUBCUTANEOUS
  Administered 2021-01-28: 3 [IU] via SUBCUTANEOUS
  Administered 2021-01-28 – 2021-01-29 (×2): 2 [IU] via SUBCUTANEOUS
  Administered 2021-01-29: 3 [IU] via SUBCUTANEOUS
  Administered 2021-01-30 – 2021-02-01 (×7): 2 [IU] via SUBCUTANEOUS
  Administered 2021-02-01: 7 [IU] via SUBCUTANEOUS
  Administered 2021-02-02: 1 [IU] via SUBCUTANEOUS

## 2021-01-26 NOTE — Progress Notes (Signed)
  Subjective:  Patient ID: [name], male    DOB: July 18, 1965,  MRN: [id number]  Feeling well this foot is still painful but has improved somewhat   Negative for chest pain and shortness of breath Chest pain: no Shortness of breath: no Fever: no Night sweats: no Constitutional signs: no Review of all other systems is negative Objective:   Vitals:   01/26/21 0830 01/26/21 0937  BP:  105/73  Pulse:    Resp: 16 18  Temp:    SpO2:     General AA&O x3. Normal mood and affect.  Vascular Nonpalpable pedal pulses  Neurologic Epicritic sensation grossly intact.  Dermatologic Necrotic fourth and fifth toes to the level of the MTPJ, cellulitis has improved and nearly fully resolved no purulence malodor or increasing necrosis  Orthopedic: Motor intact    Assessment & Plan:  Patient was evaluated and treated and all questions answered.  Gangrene in setting of diabetes and PAD -Continue antibiotics cellulitis has improved quite a bit -Vascular surgery planning to angio on Monday or Tuesday, appreciate their assistance and revascularization -We will plan for amputation following revascularization which I discussed with him and he is agreeable to.  In the event he worsens, cellulitis returns develops necrosis or purulence, spikes a fever or significant leukocytosis would take for amputation for source control prior to revascularization but would defer to wait until after if stable -We will continue to follow-up  Edwin Cap, DPM  Accessible via secure chat for questions or concerns.

## 2021-01-26 NOTE — Progress Notes (Signed)
PROGRESS NOTE    [name]  OZD:[id number] DOB: 03/22/1966 DOA: 01/22/2021 PCP: Joice Lofts, NP   Chief Complaint  Patient presents with   Wound Infection    Brief Narrative: 55 year old male with diabetes, hypertension and tobacco abuse, HLD, CAD status post CABG times 47/14/22 over the saphenous vein was harvested on his right leg and has developed infection of the wound on the side since then, followed by outpatient podiatry, wound was improving.  In the last week it has gotten worse with discoloration of the right fourth and fifth toes more ordered and worsening symptoms, last 24 hours prior to admission got worse.  Came to the ED work-up showed osteomyelitis and MRI podiatry was consulted and was admitted  Subjective: Seen this morning is resting comfortably denies any complaints  Assessment & Plan:  Osteomyelitis and nonhealing wound on of toe of right foot in the setting of diabetes, PAD: Podiatry following, imaging shows PVD, vascular involvement planning for angiogram tomorrow.  Continue on vancomycin and Zosyn for now.  Continue aspirin.  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  Continue current long-acting insulin SSI Neurontin and monitor CBG as below Recent Labs  Lab 01/25/21 1119 01/25/21 1627 01/25/21 2029 01/26/21 0713 01/26/21 1117  GLUCAP 237* 345* 223* 196* 217*    Hypertension controlled on metoprolol  Anxiety/depression mood is stable on BuSpar, Cymbalta and Topamax  Hyperlipidemia on Crestor  Orthostatic hypotension continue home midodrine monitor vitals  Anemia suspect in the setting of chronic disease.  Monitor hemoglobin  Severe protein calorie moderation augment diet as below  Diet Order             Diet Carb Modified Fluid consistency: Thin; Room service appropriate? No  Diet effective now                   Nutrition Problem:  Severe Malnutrition Etiology: chronic illness (CAD s/p CABG) Signs/Symptoms: severe fat depletion, severe muscle depletion, energy intake < or equal to 75% for > or equal to 1 month, percent weight loss Interventions: Refer to RD note for recommendations Patient's Body mass index is 18.86 kg/m.  DVT prophylaxis: enoxaparin (LOVENOX) injection 40 mg Start: 01/23/21 0600 Code Status:   Code Status: Full Code  Family Communication: plan of care discussed with patient at bedside. Status is: Inpatient  Remains inpatient appropriate because:IV treatments appropriate due to intensity of illness or inability to take PO and Inpatient level of care appropriate due to severity of illness  Dispo: The patient is from: Home              Anticipated d/c is to: Home              Patient currently is not medically stable to d/c.   Difficult to place patient No       Unresulted Labs (From admission, onward)     Start     Ordered   01/29/21 0500  Creatinine, serum  (enoxaparin (LOVENOX)    CrCl >/= 30 ml/min)  Weekly,   R     Comments: while on enoxaparin therapy    01/23/21 0319            Medications reviewed:  Scheduled Meds:  (feeding supplement) PROSource Plus  30 mL Oral BID BM   aspirin  81 mg Oral Daily   busPIRone  10 mg Oral BID   diphenhydrAMINE  25  mg Oral Daily   DULoxetine  60 mg Oral q AM   enoxaparin (LOVENOX) injection  40 mg Subcutaneous Q24H   feeding supplement  1 Container Oral BID BM   ferrous sulfate  325 mg Oral Q breakfast   gabapentin  300 mg Oral TID   insulin aspart  0-9 Units Subcutaneous TID WC   insulin glargine-yfgn  14 Units Subcutaneous Daily   metoprolol tartrate  12.5 mg Oral BID   midodrine  10 mg Oral TID WC   multivitamin with minerals  1 tablet Oral Daily   pantoprazole  40 mg Oral Daily   rosuvastatin  20 mg Oral Daily   topiramate  50 mg Oral BID   Vitamin D (Ergocalciferol)  50,000 Units Oral Q Fri   Continuous Infusions:  sodium  chloride Stopped (01/24/21 1436)   piperacillin-tazobactam (ZOSYN)  IV 3.375 g (01/26/21 0548)   vancomycin 1,250 mg (01/25/21 2138)   Consultants:see note  Procedures:see note Antimicrobials: Anti-infectives (From admission, onward)    Start     Dose/Rate Route Frequency Ordered Stop   01/23/21 2045  vancomycin (VANCOREADY) IVPB 1250 mg/250 mL        1,250 mg 166.7 mL/hr over 90 Minutes Intravenous Every 24 hours 01/22/21 2224     01/23/21 0600  piperacillin-tazobactam (ZOSYN) IVPB 3.375 g       See Hyperspace for full Linked Orders Report.   3.375 g 12.5 mL/hr over 240 Minutes Intravenous Every 8 hours 01/22/21 2034     01/22/21 2045  piperacillin-tazobactam (ZOSYN) IVPB 3.375 g       See Hyperspace for full Linked Orders Report.   3.375 g 100 mL/hr over 30 Minutes Intravenous  Once 01/22/21 2034 01/22/21 2118   01/22/21 2045  vancomycin (VANCOREADY) IVPB 1250 mg/250 mL  Status:  Discontinued        1,250 mg 166.7 mL/hr over 90 Minutes Intravenous  Once 01/22/21 2034 01/22/21 2224      Culture/Microbiology    Component Value Date/Time   SDES BLOOD SITE NOT SPECIFIED 01/22/2021 1701   SPECREQUEST  01/22/2021 1701    BOTTLES DRAWN AEROBIC AND ANAEROBIC Blood Culture results may not be optimal due to an inadequate volume of blood received in culture bottles   CULT  01/22/2021 1701    NO GROWTH 4 DAYS Performed at [facility], [address] [place], [place] [address]    REPTSTATUS PENDING 01/22/2021 1701    Other culture-see note  Objective: Vitals: Today's Vitals   01/25/21 2228 01/26/21 0552 01/26/21 0830 01/26/21 0937  BP: 114/60 122/73  105/73  Pulse: 88     Resp:  18 16 18   Temp:  97.7 F (36.5 C)    TempSrc:  Oral    SpO2:  100%    Weight:  54.6 kg    Height:      PainSc:   8  8     Intake/Output Summary (Last 24 hours) at 01/26/2021 1204 Last data filed at 01/26/2021 1050 Gross per 24 hour  Intake 410 ml  Output 600 ml  Net -190 ml   Filed  Weights   01/22/21 1646 01/26/21 0552  Weight: 55.3 kg 54.6 kg   Weight change:   Intake/Output from previous day: 08/12 0701 - 08/13 0700 In: 760 [P.O.:350; I.V.:110; IV Piggyback:300] Out: 300 [Urine:300] Intake/Output this shift: Total I/O In: 0  Out: 300 [Urine:300] Filed Weights   01/22/21 1646 01/26/21 0552  Weight: 55.3 kg 54.6 kg  Examination: General exam: AAO x3, thin frail HEENT:Oral mucosa moist, Ear/Nose WNL grossly,dentition normal. Respiratory system: bilaterally diminished,no use of accessory muscle, non tender. Cardiovascular system: S1 & S2 +,o JVD. Gastrointestinal system: Abdomen soft, NT,ND, BS+. Nervous System:Alert, awake, moving extremities Extremities: no edema, distal peripheral pulses palpable.  Right foot wound as below Skin: No rashes,no icterus. MSK: Normal muscle bulk,tone, power   Data Reviewed: I have personally reviewed following labs and imaging studies CBC: Recent Labs  Lab 01/22/21 1655 01/23/21 0343 01/24/21 0257 01/26/21 0349  WBC 11.3* 8.0 9.5 12.5*  NEUTROABS 8.5*  --   --   --   HGB 10.7* 11.1* 9.7* 9.9*  HCT 32.7* 34.7* 29.3* 30.8*  MCV 86.3 86.1 84.9 86.3  PLT 468* 339 362 366   Basic Metabolic Panel: Recent Labs  Lab 01/22/21 1655 01/23/21 0343 01/24/21 0257 01/25/21 0217 01/25/21 2015 01/26/21 0349  NA 135  --  138 135 133* 137  K 4.6  --  3.5 4.1 4.2 4.1  CL 100  --  106 102 102 104  CO2 24  --  23 25 22 23   GLUCOSE 295*  --  145* 295* 209* 206*  BUN 24*  --  17 16 23* 19  CREATININE 0.95 0.79 0.86 1.01 0.80 0.87  CALCIUM 9.9  --  9.2 9.3 9.2 9.5   GFR: Estimated Creatinine Clearance: 74.1 mL/min (by C-G formula based on SCr of 0.87 mg/dL). Liver Function Tests: Recent Labs  Lab 01/22/21 1655 01/25/21 2015  AST 17 30  ALT 18 25  ALKPHOS 280* 328*  BILITOT 0.3 0.5  PROT 7.6 7.0  ALBUMIN 3.2* 2.7*   No results for input(s): LIPASE, AMYLASE in the last 168 hours. No results for input(s): AMMONIA  in the last 168 hours. Coagulation Profile: No results for input(s): INR, PROTIME in the last 168 hours. Cardiac Enzymes: No results for input(s): CKTOTAL, CKMB, CKMBINDEX, TROPONINI in the last 168 hours. BNP (last 3 results) No results for input(s): PROBNP in the last 8760 hours. HbA1C: No results for input(s): HGBA1C in the last 72 hours. CBG: Recent Labs  Lab 01/25/21 1119 01/25/21 1627 01/25/21 2029 01/26/21 0713 01/26/21 1117  GLUCAP 237* 345* 223* 196* 217*   Lipid Profile: No results for input(s): CHOL, HDL, LDLCALC, TRIG, CHOLHDL, LDLDIRECT in the last 72 hours. Thyroid Function Tests: No results for input(s): TSH, T4TOTAL, FREET4, T3FREE, THYROIDAB in the last 72 hours. Anemia Panel: No results for input(s): VITAMINB12, FOLATE, FERRITIN, TIBC, IRON, RETICCTPCT in the last 72 hours. Sepsis Labs: Recent Labs  Lab 01/22/21 1701 01/22/21 2035  LATICACIDVEN 2.6* 1.8    Recent Results (from the past 240 hour(s))  Blood culture (routine single)     Status: None (Preliminary result)   Collection Time: 01/22/21  5:01 PM   Specimen: BLOOD  Result Value Ref Range Status   Specimen Description BLOOD SITE NOT SPECIFIED  Final   Special Requests   Final    BOTTLES DRAWN AEROBIC AND ANAEROBIC Blood Culture results may not be optimal due to an inadequate volume of blood received in culture bottles   Culture   Final    NO GROWTH 4 DAYS Performed at [facility], [address] [place], [place]    Report Status PENDING  Incomplete  Resp Panel by RT-PCR (Flu A&B, Covid) Nasopharyngeal Swab     Status: None   Collection Time: 01/22/21  8:28 PM   Specimen: Nasopharyngeal Swab; Nasopharyngeal(NP) swabs in vial  transport medium  Result Value Ref Range Status   SARS Coronavirus 2 by RT PCR NEGATIVE NEGATIVE Final    Comment: (NOTE) SARS-CoV-2 target nucleic acids are NOT DETECTED.  The SARS-CoV-2 RNA is generally detectable in upper respiratory specimens  during the acute phase of infection. The lowest concentration of SARS-CoV-2 viral copies this assay can detect is 138 copies/mL. A negative result does not preclude SARS-Cov-2 infection and should not be used as the sole basis for treatment or other patient management decisions. A negative result may occur with  improper specimen collection/handling, submission of specimen other than nasopharyngeal swab, presence of viral mutation(s) within the areas targeted by this assay, and inadequate number of viral copies(<138 copies/mL). A negative result must be combined with clinical observations, patient history, and epidemiological information. The expected result is Negative.  Fact Sheet for Patients:  BloggerCourse.com  Fact Sheet for Healthcare Providers:  SeriousBroker.it  This test is no t yet approved or cleared by the [place] FDA and  has been authorized for detection and/or diagnosis of SARS-CoV-2 by FDA under an Emergency Use Authorization (EUA). This EUA will remain  in effect (meaning this test can be used) for the duration of the COVID-19 declaration under Section 564(b)(1) of the Act, 21 U.S.C.section 360bbb-3(b)(1), unless the authorization is terminated  or revoked sooner.       Influenza A by PCR NEGATIVE NEGATIVE Final   Influenza B by PCR NEGATIVE NEGATIVE Final    Comment: (NOTE) The Xpert Xpress SARS-CoV-2/FLU/RSV plus assay is intended as an aid in the diagnosis of influenza from Nasopharyngeal swab specimens and should not be used as a sole basis for treatment. Nasal washings and aspirates are unacceptable for Xpert Xpress SARS-CoV-2/FLU/RSV testing.  Fact Sheet for Patients: BloggerCourse.com  Fact Sheet for Healthcare Providers: SeriousBroker.it  This test is not yet approved or cleared by the [place] FDA and has been authorized for detection  and/or diagnosis of SARS-CoV-2 by FDA under an Emergency Use Authorization (EUA). This EUA will remain in effect (meaning this test can be used) for the duration of the COVID-19 declaration under Section 564(b)(1) of the Act, 21 U.S.C. section 360bbb-3(b)(1), unless the authorization is terminated or revoked.  Performed at [facility], [address], [place], [place] [address]      Radiology Studies: VAS [place] ABI WITH/WO TBI  Result Date: 01/25/2021  LOWER EXTREMITY DOPPLER STUDY Patient Name:  [name]  Date of Exam:   01/25/2021 Medical Rec #: [id number]       Accession #:    [id number] Date of Birth: 21-Mar-1966      Patient Gender: M Patient Age:   23 years Exam Location:  [facility] Procedure:      VAS [place] ABI WITH/WO TBI Referring Phys: [facility] --------------------------------------------------------------------------------  Indications: Gangrene. High Risk         Hypertension, hyperlipidemia, Diabetes, coronary artery Factors:          disease.  Comparison Study: 12/24/20 prior Performing Technologist: [place] Ponder RVS  Examination Guidelines: A complete evaluation includes at minimum, Doppler waveform signals and systolic blood pressure reading at the level of bilateral brachial, anterior tibial, and posterior tibial arteries, when vessel segments are accessible. Bilateral testing is considered an integral part of a complete examination. Photoelectric Plethysmograph (PPG) waveforms and toe systolic pressure readings are included as required and additional duplex testing as needed. Limited examinations for reoccurring indications may be performed as noted.  ABI Findings: +---------+------------------+-----+-------------------+--------+ Right  Rt Pressure (mmHg)IndexWaveform           Comment  +---------+------------------+-----+-------------------+--------+ Brachial 107                    triphasic                    +---------+------------------+-----+-------------------+--------+ PTA                             absent                      +---------+------------------+-----+-------------------+--------+ DP       66                0.61 dampened monophasic         +---------+------------------+-----+-------------------+--------+ Great Toe9                 0.08 Abnormal                    +---------+------------------+-----+-------------------+--------+ +---------+------------------+-----+----------+-------+ Left     Lt Pressure (mmHg)IndexWaveform  Comment +---------+------------------+-----+----------+-------+ Brachial 109                    triphasic         +---------+------------------+-----+----------+-------+ PTA      113               1.04 monophasic        +---------+------------------+-----+----------+-------+ DP       107               0.98 monophasic        +---------+------------------+-----+----------+-------+ Great Toe93                0.85 Normal            +---------+------------------+-----+----------+-------+ +-------+-----------+-----------+------------+------------+ ABI/TBIToday's ABIToday's TBIPrevious ABIPrevious TBI +-------+-----------+-----------+------------+------------+ Right  0.61       0.06                                +-------+-----------+-----------+------------+------------+ Left   1.04       0.85                                +-------+-----------+-----------+------------+------------+  Arterial wall calcification precludes accurate ankle pressures and ABIs.  Summary: Right: Resting right ankle-brachial index indicates moderate right lower extremity arterial disease. The right toe-brachial index is abnormal. Left: Resting left ankle-brachial index is within normal range. No evidence of significant left lower extremity arterial disease.  *See table(s) above for measurements and observations.     Preliminary      LOS: 4 days    Lanae Boast, MD [facility]  01/26/2021, 12:04 PM

## 2021-01-26 NOTE — Progress Notes (Signed)
HS CBG 310 with no HS SSI ordered. Provider on call notified via amion. Dierdre Highman, RN

## 2021-01-27 DIAGNOSIS — I70235 Atherosclerosis of native arteries of right leg with ulceration of other part of foot: Secondary | ICD-10-CM

## 2021-01-27 DIAGNOSIS — I10 Essential (primary) hypertension: Secondary | ICD-10-CM

## 2021-01-27 DIAGNOSIS — L97519 Non-pressure chronic ulcer of other part of right foot with unspecified severity: Secondary | ICD-10-CM

## 2021-01-27 LAB — BASIC METABOLIC PANEL
Anion gap: 7 (ref 5–15)
BUN: 22 mg/dL — ABNORMAL HIGH (ref 6–20)
CO2: 25 mmol/L (ref 22–32)
Calcium: 9.3 mg/dL (ref 8.9–10.3)
Chloride: 104 mmol/L (ref 98–111)
Creatinine, Ser: 0.9 mg/dL (ref 0.61–1.24)
GFR, Estimated: >60 mL/min (ref >60)
Glucose, Bld: 305 mg/dL — ABNORMAL HIGH (ref 70–99)
Potassium: 4.3 mmol/L (ref 3.5–5.1)
Sodium: 136 mmol/L (ref 135–145)

## 2021-01-27 LAB — CBC
HCT: 30.5 % — ABNORMAL LOW (ref 39.0–52.0)
Hemoglobin: 9.8 g/dL — ABNORMAL LOW (ref 13.0–17.0)
MCH: 27.4 pg (ref 26.0–34.0)
MCHC: 32.1 g/dL (ref 30.0–36.0)
MCV: 85.2 fL (ref 80.0–100.0)
Platelets: 356 10*3/uL (ref 150–400)
RBC: 3.58 MIL/uL — ABNORMAL LOW (ref 4.22–5.81)
RDW: 13.2 % (ref 11.5–15.5)
WBC: 9.5 10*3/uL (ref 4.0–10.5)
nRBC: 0 % (ref 0.0–0.2)

## 2021-01-27 LAB — GLUCOSE, CAPILLARY
Glucose-Capillary: 223 mg/dL — ABNORMAL HIGH (ref 70–99)
Glucose-Capillary: 271 mg/dL — ABNORMAL HIGH (ref 70–99)
Glucose-Capillary: 321 mg/dL — ABNORMAL HIGH (ref 70–99)
Glucose-Capillary: 271 mg/dL — ABNORMAL HIGH (ref 70–99)

## 2021-01-27 LAB — CULTURE, BLOOD (SINGLE): Culture: NO GROWTH

## 2021-01-27 MED ORDER — INSULIN GLARGINE-YFGN 100 UNIT/ML ~~LOC~~ SOLN
16.0000 [IU] | Freq: Every day | SUBCUTANEOUS | Status: DC
  Administered 2021-01-28 – 2021-02-02 (×6): 16 [IU] via SUBCUTANEOUS
  Filled 2021-01-27 (×6): qty 0.16

## 2021-01-27 NOTE — H&P (View-Only) (Signed)
    Subjective  -  No complaints this morning   Physical Exam:  Persistent ischemic changes to the right fourth and fifth toe Nonpalpable pedal pulses Abdomen soft and nontender Respirations nonlabored   Assessment/Plan:    PAD with ulcer: I discussed that we would proceed with angiography on Tuesday.  We discussed the details of the procedure.  He will need to be n.p.o. after midnight Monday night.  Wells Rhealynn Myhre 01/27/2021 2:59 PM --  Vitals:   01/27/21 1129 01/27/21 1232  BP: 100/65 119/61  Pulse: 87 85  Resp: 15 13  Temp: 97.8 F (36.6 C)   SpO2: 100% 100%    Intake/Output Summary (Last 24 hours) at 01/27/2021 1459 Last data filed at 01/27/2021 1458 Gross per 24 hour  Intake --  Output 1300 ml  Net -1300 ml     Laboratory CBC    Component Value Date/Time   WBC 9.5 01/27/2021 0234   HGB 9.8 (L) 01/27/2021 0234   HGB 9.4 (L) 01/15/2021 0000   HCT 30.5 (L) 01/27/2021 0234   HCT 28.9 (L) 01/15/2021 0000   PLT 356 01/27/2021 0234   PLT 569 (H) 01/15/2021 0000    BMET    Component Value Date/Time   NA 136 01/27/2021 0234   NA 136 01/15/2021 0000   K 4.3 01/27/2021 0234   CL 104 01/27/2021 0234   CO2 25 01/27/2021 0234   GLUCOSE 305 (H) 01/27/2021 0234   BUN 22 (H) 01/27/2021 0234   BUN 26 (H) 01/15/2021 0000   CREATININE 0.90 01/27/2021 0234   CALCIUM 9.3 01/27/2021 0234   GFRNONAA >60 01/27/2021 0234   GFRAA  09/08/2008 1152    >60        The eGFR has been calculated using the MDRD equation. This calculation has not been validated in all clinical situations. eGFR's persistently <60 mL/min signify possible Chronic Kidney Disease.    COAG Lab Results  Component Value Date   INR 1.6 (H) 12/27/2020   INR 1.0 12/27/2020   INR 1.0 09/08/2008   No results found for: PTT  Antibiotics Anti-infectives (From admission, onward)    Start     Dose/Rate Route Frequency Ordered Stop   01/23/21 2045  vancomycin (VANCOREADY) IVPB 1250 mg/250 mL         1,250 mg 166.7 mL/hr over 90 Minutes Intravenous Every 24 hours 01/22/21 2224     01/23/21 0600  piperacillin-tazobactam (ZOSYN) IVPB 3.375 g       See Hyperspace for full Linked Orders Report.   3.375 g 12.5 mL/hr over 240 Minutes Intravenous Every 8 hours 01/22/21 2034     01/22/21 2045  piperacillin-tazobactam (ZOSYN) IVPB 3.375 g       See Hyperspace for full Linked Orders Report.   3.375 g 100 mL/hr over 30 Minutes Intravenous  Once 01/22/21 2034 01/22/21 2118   01/22/21 2045  vancomycin (VANCOREADY) IVPB 1250 mg/250 mL  Status:  Discontinued        1,250 mg 166.7 mL/hr over 90 Minutes Intravenous  Once 01/22/21 2034 01/22/21 2224        V. Leia Alf, M.D., [facility] [place] Office: [phone] Pager:  [phone]

## 2021-01-27 NOTE — Progress Notes (Signed)
    Subjective  -  No complaints this morning   Physical Exam:  Persistent ischemic changes to the right fourth and fifth toe Nonpalpable pedal pulses Abdomen soft and nontender Respirations nonlabored   Assessment/Plan:    PAD with ulcer: I discussed that we would proceed with angiography on Tuesday.  We discussed the details of the procedure.  He will need to be n.p.o. after midnight Monday night.  Wells Anupama Piehl 01/27/2021 2:59 PM --  Vitals:   01/27/21 1129 01/27/21 1232  BP: 100/65 119/61  Pulse: 87 85  Resp: 15 13  Temp: 97.8 F (36.6 C)   SpO2: 100% 100%    Intake/Output Summary (Last 24 hours) at 01/27/2021 1459 Last data filed at 01/27/2021 1458 Gross per 24 hour  Intake --  Output 1300 ml  Net -1300 ml     Laboratory CBC    Component Value Date/Time   WBC 9.5 01/27/2021 0234   HGB 9.8 (L) 01/27/2021 0234   HGB 9.4 (L) 01/15/2021 0000   HCT 30.5 (L) 01/27/2021 0234   HCT 28.9 (L) 01/15/2021 0000   PLT 356 01/27/2021 0234   PLT 569 (H) 01/15/2021 0000    BMET    Component Value Date/Time   NA 136 01/27/2021 0234   NA 136 01/15/2021 0000   K 4.3 01/27/2021 0234   CL 104 01/27/2021 0234   CO2 25 01/27/2021 0234   GLUCOSE 305 (H) 01/27/2021 0234   BUN 22 (H) 01/27/2021 0234   BUN 26 (H) 01/15/2021 0000   CREATININE 0.90 01/27/2021 0234   CALCIUM 9.3 01/27/2021 0234   GFRNONAA >60 01/27/2021 0234   GFRAA  09/08/2008 1152    >60        The eGFR has been calculated using the MDRD equation. This calculation has not been validated in all clinical situations. eGFR's persistently <60 mL/min signify possible Chronic Kidney Disease.    COAG Lab Results  Component Value Date   INR 1.6 (H) 12/27/2020   INR 1.0 12/27/2020   INR 1.0 09/08/2008   No results found for: PTT  Antibiotics Anti-infectives (From admission, onward)    Start     Dose/Rate Route Frequency Ordered Stop   01/23/21 2045  vancomycin (VANCOREADY) IVPB 1250 mg/250 mL         1,250 mg 166.7 mL/hr over 90 Minutes Intravenous Every 24 hours 01/22/21 2224     01/23/21 0600  piperacillin-tazobactam (ZOSYN) IVPB 3.375 g       See Hyperspace for full Linked Orders Report.   3.375 g 12.5 mL/hr over 240 Minutes Intravenous Every 8 hours 01/22/21 2034     01/22/21 2045  piperacillin-tazobactam (ZOSYN) IVPB 3.375 g       See Hyperspace for full Linked Orders Report.   3.375 g 100 mL/hr over 30 Minutes Intravenous  Once 01/22/21 2034 01/22/21 2118   01/22/21 2045  vancomycin (VANCOREADY) IVPB 1250 mg/250 mL  Status:  Discontinued        1,250 mg 166.7 mL/hr over 90 Minutes Intravenous  Once 01/22/21 2034 01/22/21 2224        V. Leia Alf, M.D., [facility] [place] Office: [phone] Pager:  [phone]

## 2021-01-27 NOTE — Progress Notes (Signed)
PROGRESS NOTE    [name]  ZOX:[id number] DOB: August 26, 1965 DOA: 01/22/2021 PCP: [name], [name], NP   Chief Complaint  Patient presents with   Wound Infection    Brief Narrative: 55 year old male with diabetes, hypertension and tobacco abuse, HLD, CAD status post CABG times 47/14/22 over the saphenous vein was harvested on his right leg and has developed infection of the wound on the side since then, followed by outpatient podiatry, wound was improving.  In the last week it has gotten worse with discoloration of the right fourth and fifth toes more ordered and worsening symptoms, last 24 hours prior to admission got worse.  Came to the ED work-up showed osteomyelitis and MRI podiatry was consulted and was admitted  Subjective:  Assessment & Plan:  Osteomyelitis and nonhealing wound on of toe of right foot in the setting of diabetes, PAD: Podiatry following, imaging shows PVD, vascular involvement planning for angiogram this coming Tuesday, to plan for further revascularization, continue on vancomycin and Zosyn for now.  Continue aspirin.  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  Continue current long-acting insulin SSI Neurontin and monitor CBG as below, CBG mildly elevated will increase Semglee acting insulin to 16 units daily Recent Labs  Lab 01/26/21 1117 01/26/21 1509 01/26/21 2115 01/27/21 0733 01/27/21 1128  GLUCAP 217* 208* 310* 223* 271*    Hypertension controlled on metoprolol  Anxiety/depression mood is stable on BuSpar, Cymbalta and Topamax  Hyperlipidemia on Crestor  Orthostatic hypotension continue home midodrine monitor vitals  Anemia suspect in the setting of chronic disease.  Monitor hemoglobin  Severe protein calorie moderation augment diet as below  Diet Order             Diet Carb Modified Fluid consistency: Thin; Room service appropriate? No  Diet  effective now                   Nutrition Problem: Severe Malnutrition Etiology: chronic illness (CAD s/p CABG) Signs/Symptoms: severe fat depletion, severe muscle depletion, energy intake < or equal to 75% for > or equal to 1 month, percent weight loss Interventions: Refer to RD note for recommendations Patient's Body mass index is 18.86 kg/m.  DVT prophylaxis: enoxaparin (LOVENOX) injection 40 mg Start: 01/23/21 0600 Code Status:   Code Status: Full Code  Family Communication: plan of care discussed with patient at bedside. Status is: Inpatient  Remains inpatient appropriate because:IV treatments appropriate due to intensity of illness or inability to take PO and Inpatient level of care appropriate due to severity of illness  Dispo: The patient is from: Home              Anticipated d/c is to: Home              Patient currently is not medically stable to d/c.   Difficult to place patient No       Unresulted Labs (From admission, onward)     Start     Ordered   01/29/21 0500  Creatinine, serum  (enoxaparin (LOVENOX)    CrCl >/= 30 ml/min)  Weekly,   R     Comments: while on enoxaparin therapy    01/23/21 0319            Medications reviewed:  Scheduled Meds:  (feeding supplement) PROSource Plus  30 mL Oral BID BM   aspirin  81 mg Oral Daily   busPIRone  10 mg Oral BID   diphenhydrAMINE  25 mg Oral Daily   DULoxetine  60 mg Oral q AM   enoxaparin (LOVENOX) injection  40 mg Subcutaneous Q24H   feeding supplement  1 Container Oral BID BM   ferrous sulfate  325 mg Oral Q breakfast   gabapentin  300 mg Oral TID   insulin aspart  0-5 Units Subcutaneous QHS   insulin aspart  0-9 Units Subcutaneous TID WC   insulin glargine-yfgn  14 Units Subcutaneous Daily   metoprolol tartrate  12.5 mg Oral BID   midodrine  10 mg Oral TID WC   multivitamin with minerals  1 tablet Oral Daily   pantoprazole  40 mg Oral Daily   rosuvastatin  20 mg Oral Daily   topiramate  50 mg  Oral BID   Vitamin D (Ergocalciferol)  50,000 Units Oral Q Fri   Continuous Infusions:  sodium chloride Stopped (01/24/21 1436)   piperacillin-tazobactam (ZOSYN)  IV 3.375 g (01/27/21 1307)   vancomycin 1,250 mg (01/26/21 2015)   Consultants -Podiatry Vascular  Procedures   Antimicrobials: Anti-infectives (From admission, onward)    Start     Dose/Rate Route Frequency Ordered Stop   01/23/21 2045  vancomycin (VANCOREADY) IVPB 1250 mg/250 mL        1,250 mg 166.7 mL/hr over 90 Minutes Intravenous Every 24 hours 01/22/21 2224     01/23/21 0600  piperacillin-tazobactam (ZOSYN) IVPB 3.375 g       See Hyperspace for full Linked Orders Report.   3.375 g 12.5 mL/hr over 240 Minutes Intravenous Every 8 hours 01/22/21 2034     01/22/21 2045  piperacillin-tazobactam (ZOSYN) IVPB 3.375 g       See Hyperspace for full Linked Orders Report.   3.375 g 100 mL/hr over 30 Minutes Intravenous  Once 01/22/21 2034 01/22/21 2118   01/22/21 2045  vancomycin (VANCOREADY) IVPB 1250 mg/250 mL  Status:  Discontinued        1,250 mg 166.7 mL/hr over 90 Minutes Intravenous  Once 01/22/21 2034 01/22/21 2224      Culture/Microbiology    Component Value Date/Time   SDES BLOOD SITE NOT SPECIFIED 01/22/2021 1701   SPECREQUEST  01/22/2021 1701    BOTTLES DRAWN AEROBIC AND ANAEROBIC Blood Culture results may not be optimal due to an inadequate volume of blood received in culture bottles   CULT  01/22/2021 1701    NO GROWTH 5 DAYS Performed at [facility], [address] [place], [place] [address]    REPTSTATUS 01/27/2021 FINAL 01/22/2021 1701    Other culture-see note  Objective: Vitals: Today's Vitals   01/27/21 0832 01/27/21 0847 01/27/21 1129 01/27/21 1232  BP: 94/61 100/62 100/65 119/61  Pulse: 83 80 87 85  Resp: 18 13 15 13   Temp:   97.8 F (36.6 C)   TempSrc:   Oral   SpO2: 100% 100% 100% 100%  Weight:      Height:      PainSc:        Intake/Output Summary (Last 24 hours)  at 01/27/2021 1520 Last data filed at 01/27/2021 1458 Gross per 24 hour  Intake --  Output 1300 ml  Net -1300 ml   Filed Weights   01/22/21 1646 01/26/21 0552  Weight: 55.3 kg 54.6 kg   Weight change:   Intake/Output from previous day: 08/13 0701 - 08/14 0700 In: 0  Out: 650 [Urine:650] Intake/Output this shift: Total I/O In: -  Out: 950 [Urine:950] Filed  Weights   01/22/21 1646 01/26/21 0552  Weight: 55.3 kg 54.6 kg   Examination:  Awake Alert, Oriented X 3, frail, deconditioned, appears much older than stated age Symmetrical Chest wall movement, Good air movement bilaterally, CTAB RRR,No Gallops,Rubs or new Murmurs, No Parasternal Heave +ve B.Sounds, Abd Soft, No tenderness, No rebound - guarding or rigidity. Right fourth/fifth toe gangrene     Data Reviewed: I have personally reviewed following labs and imaging studies CBC: Recent Labs  Lab 01/22/21 1655 01/23/21 0343 01/24/21 0257 01/26/21 0349 01/27/21 0234  WBC 11.3* 8.0 9.5 12.5* 9.5  NEUTROABS 8.5*  --   --   --   --   HGB 10.7* 11.1* 9.7* 9.9* 9.8*  HCT 32.7* 34.7* 29.3* 30.8* 30.5*  MCV 86.3 86.1 84.9 86.3 85.2  PLT 468* 339 362 366 356   Basic Metabolic Panel: Recent Labs  Lab 01/24/21 0257 01/25/21 0217 01/25/21 2015 01/26/21 0349 01/27/21 0234  NA 138 135 133* 137 136  K 3.5 4.1 4.2 4.1 4.3  CL 106 102 102 104 104  CO2 23 25 22 23 25   GLUCOSE 145* 295* 209* 206* 305*  BUN 17 16 23* 19 22*  CREATININE 0.86 1.01 0.80 0.87 0.90  CALCIUM 9.2 9.3 9.2 9.5 9.3   GFR: Estimated Creatinine Clearance: 71.6 mL/min (by C-G formula based on SCr of 0.9 mg/dL). Liver Function Tests: Recent Labs  Lab 01/22/21 1655 01/25/21 2015  AST 17 30  ALT 18 25  ALKPHOS 280* 328*  BILITOT 0.3 0.5  PROT 7.6 7.0  ALBUMIN 3.2* 2.7*   No results for input(s): LIPASE, AMYLASE in the last 168 hours. No results for input(s): AMMONIA in the last 168 hours. Coagulation Profile: No results for input(s): INR,  PROTIME in the last 168 hours. Cardiac Enzymes: No results for input(s): CKTOTAL, CKMB, CKMBINDEX, TROPONINI in the last 168 hours. BNP (last 3 results) No results for input(s): PROBNP in the last 8760 hours. HbA1C: No results for input(s): HGBA1C in the last 72 hours. CBG: Recent Labs  Lab 01/26/21 1117 01/26/21 1509 01/26/21 2115 01/27/21 0733 01/27/21 1128  GLUCAP 217* 208* 310* 223* 271*   Lipid Profile: No results for input(s): CHOL, HDL, LDLCALC, TRIG, CHOLHDL, LDLDIRECT in the last 72 hours. Thyroid Function Tests: No results for input(s): TSH, T4TOTAL, FREET4, T3FREE, THYROIDAB in the last 72 hours. Anemia Panel: No results for input(s): VITAMINB12, FOLATE, FERRITIN, TIBC, IRON, RETICCTPCT in the last 72 hours. Sepsis Labs: Recent Labs  Lab 01/22/21 1701 01/22/21 2035  LATICACIDVEN 2.6* 1.8    Recent Results (from the past 240 hour(s))  Blood culture (routine single)     Status: None   Collection Time: 01/22/21  5:01 PM   Specimen: BLOOD  Result Value Ref Range Status   Specimen Description BLOOD SITE NOT SPECIFIED  Final   Special Requests   Final    BOTTLES DRAWN AEROBIC AND ANAEROBIC Blood Culture results may not be optimal due to an inadequate volume of blood received in culture bottles   Culture   Final    NO GROWTH 5 DAYS Performed at [facility], [address] [place], [place]    Report Status 01/27/2021 FINAL  Final  Resp Panel by RT-PCR (Flu A&B, Covid) Nasopharyngeal Swab     Status: None   Collection Time: 01/22/21  8:28 PM   Specimen: Nasopharyngeal Swab; Nasopharyngeal(NP) swabs in vial transport medium  Result Value Ref Range Status   SARS Coronavirus 2 by  RT PCR NEGATIVE NEGATIVE Final    Comment: (NOTE) SARS-CoV-2 target nucleic acids are NOT DETECTED.  The SARS-CoV-2 RNA is generally detectable in upper respiratory specimens during the acute phase of infection. The lowest concentration of SARS-CoV-2 viral copies this  assay can detect is 138 copies/mL. A negative result does not preclude SARS-Cov-2 infection and should not be used as the sole basis for treatment or other patient management decisions. A negative result may occur with  improper specimen collection/handling, submission of specimen other than nasopharyngeal swab, presence of viral mutation(s) within the areas targeted by this assay, and inadequate number of viral copies(<138 copies/mL). A negative result must be combined with clinical observations, patient history, and epidemiological information. The expected result is Negative.  Fact Sheet for Patients:  BloggerCourse.com  Fact Sheet for Healthcare Providers:  SeriousBroker.it  This test is no t yet approved or cleared by the [place] FDA and  has been authorized for detection and/or diagnosis of SARS-CoV-2 by FDA under an Emergency Use Authorization (EUA). This EUA will remain  in effect (meaning this test can be used) for the duration of the COVID-19 declaration under Section 564(b)(1) of the Act, 21 U.S.C.section 360bbb-3(b)(1), unless the authorization is terminated  or revoked sooner.       Influenza A by PCR NEGATIVE NEGATIVE Final   Influenza B by PCR NEGATIVE NEGATIVE Final    Comment: (NOTE) The Xpert Xpress SARS-CoV-2/FLU/RSV plus assay is intended as an aid in the diagnosis of influenza from Nasopharyngeal swab specimens and should not be used as a sole basis for treatment. Nasal washings and aspirates are unacceptable for Xpert Xpress SARS-CoV-2/FLU/RSV testing.  Fact Sheet for Patients: BloggerCourse.com  Fact Sheet for Healthcare Providers: SeriousBroker.it  This test is not yet approved or cleared by the [place] FDA and has been authorized for detection and/or diagnosis of SARS-CoV-2 by FDA under an Emergency Use Authorization (EUA). This EUA will  remain in effect (meaning this test can be used) for the duration of the COVID-19 declaration under Section 564(b)(1) of the Act, 21 U.S.C. section 360bbb-3(b)(1), unless the authorization is terminated or revoked.  Performed at [facility], [address], [place], [place] [address]      Radiology Studies: No results found.   LOS: 5 days   Huey Bienenstock, MD [facility]  01/27/2021, 3:20 PM

## 2021-01-27 NOTE — Progress Notes (Signed)
Pt ambulated to BR with 1 assist and walker. Pt tolerated well. Dierdre Highman, RN

## 2021-01-28 ENCOUNTER — Ambulatory Visit: Payer: Medicaid Other | Admitting: Podiatry

## 2021-01-28 DIAGNOSIS — I96 Gangrene, not elsewhere classified: Secondary | ICD-10-CM

## 2021-01-28 LAB — GLUCOSE, CAPILLARY
Glucose-Capillary: 211 mg/dL — ABNORMAL HIGH (ref 70–99)
Glucose-Capillary: 193 mg/dL — ABNORMAL HIGH (ref 70–99)
Glucose-Capillary: 363 mg/dL — ABNORMAL HIGH (ref 70–99)
Glucose-Capillary: 230 mg/dL — ABNORMAL HIGH (ref 70–99)

## 2021-01-28 NOTE — Progress Notes (Signed)
Subjective: 55 year old male admitted on January 22, 2021 for wounds on his fourth and fifth toes.  He is known to Dr. Samuella Cota who last saw him on January 07, 2021.  At that point he had skin maceration on the fourth interspace.  His symptoms progressed and he was admitted to the hospital.  Vascular is been consulting scheduled for angio tomorrow.  States he has been feeling well and no fever or chills.  He is ready for the procedure tomorrow he states.  Objective: AAO x3, NAD On the right side the pulses are nonpalpable.gangrenous changes present to the entire fourth and fifth toes.  Malodor is present there is no purulence or drainage noted today.   No pain with calf compression  Assessment: Gangrene right foot  Plan: Patient scheduled for angio tomorrow.  Discussed with the patient as well surgical intervention likely requiring partial fourth and fifth ray amputations.  We will plan on surgery later in the week pending vascular intervention.  He had no further questions or concerns for me today and he is agreeable to the angio tomorrow as well as the plan for amputation.  Ovid Curd, DPM

## 2021-01-28 NOTE — Progress Notes (Signed)
Pre-op orders written for angiogram tomorrow   [name]

## 2021-01-28 NOTE — Progress Notes (Signed)
PROGRESS NOTE    [name]  OJJ:[id number] DOB: 02-15-1966 DOA: 01/22/2021 PCP: Joice Lofts, NP   Chief Complaint  Patient presents with   Wound Infection    Brief Narrative:  55 year old male with diabetes, hypertension and tobacco abuse, HLD, CAD status post CABG times 47/14/22 over the saphenous vein was harvested on his right leg and has developed infection of the wound on the side since then, followed by outpatient podiatry, wound was improving.  In the last week it has gotten worse with discoloration of the right fourth and fifth toes more ordered and worsening symptoms, last 24 hours prior to admission got worse.  Came to the ED work-up showed osteomyelitis and MRI podiatry was consulted and was admitted  Subjective:  -He reports pain is controlled, he denies any chest pain, shortness of breath, he is reporting  generalized weakness and fatigue.  Assessment & Plan:  Osteomyelitis and nonhealing wound on of toe of right foot in the setting of diabetes, PAD:  -Podiatry following, imaging shows PVD, vascular involvement p for angiogram tomorrow, to plan for further revascularization, continue on vancomycin and Zosyn for now.  Continue aspirin.  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  Continue current long-acting insulin SSI Neurontin and monitor CBG as below, BG remains elevated, but I will hold on increasing Semglee given he will be n.p.o. after midnight for procedure tomorrow.   Recent Labs  Lab 01/27/21 1128 01/27/21 1620 01/27/21 2127 01/28/21 0744 01/28/21 1122  GLUCAP 271* 321* 271* 211* 193*    Hypertension controlled on metoprolol  Anxiety/depression mood is stable on BuSpar, Cymbalta and Topamax  Hyperlipidemia on Crestor  Orthostatic hypotension continue home midodrine monitor vitals  Anemia suspect in the setting of chronic disease.  Monitor  hemoglobin  Severe protein calorie moderation augment diet as below  Diet Order             Diet NPO time specified Except for: Sips with Meds  Diet effective midnight           Diet Carb Modified Fluid consistency: Thin; Room service appropriate? No  Diet effective now                   Nutrition Problem: Severe Malnutrition Etiology: chronic illness (CAD s/p CABG) Signs/Symptoms: severe fat depletion, severe muscle depletion, energy intake < or equal to 75% for > or equal to 1 month, percent weight loss Interventions: Refer to RD note for recommendations Patient's Body mass index is 18.86 kg/m.  DVT prophylaxis: enoxaparin (LOVENOX) injection 40 mg Start: 01/23/21 0600 Code Status:   Code Status: Full Code  Family Communication: plan of care discussed with patient at bedside. Status is: Inpatient  Remains inpatient appropriate because:IV treatments appropriate due to intensity of illness or inability to take PO and Inpatient level of care appropriate due to severity of illness  Dispo: The patient is from: Home              Anticipated d/c is to: Home              Patient currently is not medically stable to d/c.   Difficult to place patient No       Unresulted Labs (From admission, onward)     Start     Ordered   01/29/21 0500  Creatinine, serum  (enoxaparin (LOVENOX)    CrCl >/= 30 ml/min)  Weekly,  R     Comments: while on enoxaparin therapy    01/23/21 0319            Medications reviewed:  Scheduled Meds:  (feeding supplement) PROSource Plus  30 mL Oral BID BM   aspirin  81 mg Oral Daily   busPIRone  10 mg Oral BID   diphenhydrAMINE  25 mg Oral Daily   DULoxetine  60 mg Oral q AM   enoxaparin (LOVENOX) injection  40 mg Subcutaneous Q24H   feeding supplement  1 Container Oral BID BM   ferrous sulfate  325 mg Oral Q breakfast   gabapentin  300 mg Oral TID   insulin aspart  0-5 Units Subcutaneous QHS   insulin aspart  0-9 Units Subcutaneous TID WC    insulin glargine-yfgn  16 Units Subcutaneous Daily   metoprolol tartrate  12.5 mg Oral BID   midodrine  10 mg Oral TID WC   multivitamin with minerals  1 tablet Oral Daily   pantoprazole  40 mg Oral Daily   rosuvastatin  20 mg Oral Daily   topiramate  50 mg Oral BID   Vitamin D (Ergocalciferol)  50,000 Units Oral Q Fri   Continuous Infusions:  sodium chloride Stopped (01/24/21 1436)   piperacillin-tazobactam (ZOSYN)  IV 3.375 g (01/28/21 0558)   vancomycin Stopped (01/27/21 2149)   Consultants -Podiatry Vascular  Procedures   Antimicrobials: Anti-infectives (From admission, onward)    Start     Dose/Rate Route Frequency Ordered Stop   01/23/21 2045  vancomycin (VANCOREADY) IVPB 1250 mg/250 mL        1,250 mg 166.7 mL/hr over 90 Minutes Intravenous Every 24 hours 01/22/21 2224     01/23/21 0600  piperacillin-tazobactam (ZOSYN) IVPB 3.375 g       See Hyperspace for full Linked Orders Report.   3.375 g 12.5 mL/hr over 240 Minutes Intravenous Every 8 hours 01/22/21 2034     01/22/21 2045  piperacillin-tazobactam (ZOSYN) IVPB 3.375 g       See Hyperspace for full Linked Orders Report.   3.375 g 100 mL/hr over 30 Minutes Intravenous  Once 01/22/21 2034 01/22/21 2118   01/22/21 2045  vancomycin (VANCOREADY) IVPB 1250 mg/250 mL  Status:  Discontinued        1,250 mg 166.7 mL/hr over 90 Minutes Intravenous  Once 01/22/21 2034 01/22/21 2224      Culture/Microbiology    Component Value Date/Time   SDES BLOOD SITE NOT SPECIFIED 01/22/2021 1701   SPECREQUEST  01/22/2021 1701    BOTTLES DRAWN AEROBIC AND ANAEROBIC Blood Culture results may not be optimal due to an inadequate volume of blood received in culture bottles   CULT  01/22/2021 1701    NO GROWTH 5 DAYS Performed at [facility], [address] [place], [place] [address]    REPTSTATUS 01/27/2021 FINAL 01/22/2021 1701    Other culture-see note  Objective: Vitals: Today's Vitals   01/28/21 0713 01/28/21  0802 01/28/21 0848 01/28/21 1123  BP: 100/66   103/67  Pulse: 91 91  81  Resp: 16 16  13   Temp:    98 F (36.7 C)  TempSrc:    Oral  SpO2: 100% 100%  100%  Weight:      Height:      PainSc:  8  Asleep     Intake/Output Summary (Last 24 hours) at 01/28/2021 1225 Last data filed at 01/28/2021 0800 Gross per 24 hour  Intake 610 ml  Output  1325 ml  Net -715 ml   Filed Weights   01/22/21 1646 01/26/21 0552  Weight: 55.3 kg 54.6 kg   Weight change:   Intake/Output from previous day: 08/14 0701 - 08/15 0700 In: 250 [IV Piggyback:250] Out: 1825 [Urine:1825] Intake/Output this shift: Total I/O In: 360 [P.O.:360] Out: -  Filed Weights   01/22/21 1646 01/26/21 0552  Weight: 55.3 kg 54.6 kg   Examination:  Awake Alert, Oriented X 3, family frail, deconditioned, appears much older than his stated age. Symmetrical Chest wall movement, Good air movement bilaterally, CTAB RRR,No Gallops,Rubs or new Murmurs, No Parasternal Heave +ve B.Sounds, Abd Soft, No tenderness, No rebound - guarding or rigidity. Right fourth/fifth toe gangrene     Data Reviewed: I have personally reviewed following labs and imaging studies CBC: Recent Labs  Lab 01/22/21 1655 01/23/21 0343 01/24/21 0257 01/26/21 0349 01/27/21 0234  WBC 11.3* 8.0 9.5 12.5* 9.5  NEUTROABS 8.5*  --   --   --   --   HGB 10.7* 11.1* 9.7* 9.9* 9.8*  HCT 32.7* 34.7* 29.3* 30.8* 30.5*  MCV 86.3 86.1 84.9 86.3 85.2  PLT 468* 339 362 366 356   Basic Metabolic Panel: Recent Labs  Lab 01/24/21 0257 01/25/21 0217 01/25/21 2015 01/26/21 0349 01/27/21 0234  NA 138 135 133* 137 136  K 3.5 4.1 4.2 4.1 4.3  CL 106 102 102 104 104  CO2 23 25 22 23 25   GLUCOSE 145* 295* 209* 206* 305*  BUN 17 16 23* 19 22*  CREATININE 0.86 1.01 0.80 0.87 0.90  CALCIUM 9.2 9.3 9.2 9.5 9.3   GFR: Estimated Creatinine Clearance: 71.6 mL/min (by C-G formula based on SCr of 0.9 mg/dL). Liver Function Tests: Recent Labs  Lab 01/22/21 1655  01/25/21 2015  AST 17 30  ALT 18 25  ALKPHOS 280* 328*  BILITOT 0.3 0.5  PROT 7.6 7.0  ALBUMIN 3.2* 2.7*   No results for input(s): LIPASE, AMYLASE in the last 168 hours. No results for input(s): AMMONIA in the last 168 hours. Coagulation Profile: No results for input(s): INR, PROTIME in the last 168 hours. Cardiac Enzymes: No results for input(s): CKTOTAL, CKMB, CKMBINDEX, TROPONINI in the last 168 hours. BNP (last 3 results) No results for input(s): PROBNP in the last 8760 hours. HbA1C: No results for input(s): HGBA1C in the last 72 hours. CBG: Recent Labs  Lab 01/27/21 1128 01/27/21 1620 01/27/21 2127 01/28/21 0744 01/28/21 1122  GLUCAP 271* 321* 271* 211* 193*   Lipid Profile: No results for input(s): CHOL, HDL, LDLCALC, TRIG, CHOLHDL, LDLDIRECT in the last 72 hours. Thyroid Function Tests: No results for input(s): TSH, T4TOTAL, FREET4, T3FREE, THYROIDAB in the last 72 hours. Anemia Panel: No results for input(s): VITAMINB12, FOLATE, FERRITIN, TIBC, IRON, RETICCTPCT in the last 72 hours. Sepsis Labs: Recent Labs  Lab 01/22/21 1701 01/22/21 2035  LATICACIDVEN 2.6* 1.8    Recent Results (from the past 240 hour(s))  Blood culture (routine single)     Status: None   Collection Time: 01/22/21  5:01 PM   Specimen: BLOOD  Result Value Ref Range Status   Specimen Description BLOOD SITE NOT SPECIFIED  Final   Special Requests   Final    BOTTLES DRAWN AEROBIC AND ANAEROBIC Blood Culture results may not be optimal due to an inadequate volume of blood received in culture bottles   Culture   Final    NO GROWTH 5 DAYS Performed at [facility], [address], [place],  [place] [address]    Report Status 01/27/2021 FINAL  Final  Resp Panel by RT-PCR (Flu A&B, Covid) Nasopharyngeal Swab     Status: None   Collection Time: 01/22/21  8:28 PM   Specimen: Nasopharyngeal Swab; Nasopharyngeal(NP) swabs in vial transport medium  Result Value Ref Range Status   SARS  Coronavirus 2 by RT PCR NEGATIVE NEGATIVE Final    Comment: (NOTE) SARS-CoV-2 target nucleic acids are NOT DETECTED.  The SARS-CoV-2 RNA is generally detectable in upper respiratory specimens during the acute phase of infection. The lowest concentration of SARS-CoV-2 viral copies this assay can detect is 138 copies/mL. A negative result does not preclude SARS-Cov-2 infection and should not be used as the sole basis for treatment or other patient management decisions. A negative result may occur with  improper specimen collection/handling, submission of specimen other than nasopharyngeal swab, presence of viral mutation(s) within the areas targeted by this assay, and inadequate number of viral copies(<138 copies/mL). A negative result must be combined with clinical observations, patient history, and epidemiological information. The expected result is Negative.  Fact Sheet for Patients:  BloggerCourse.comhttps://www.fda.gov/media/152166/download  Fact Sheet for Healthcare Providers:  SeriousBroker.ithttps://www.fda.gov/media/152162/download  This test is no t yet approved or cleared by the [place] FDA and  has been authorized for detection and/or diagnosis of SARS-CoV-2 by FDA under an Emergency Use Authorization (EUA). This EUA will remain  in effect (meaning this test can be used) for the duration of the COVID-19 declaration under Section 564(b)(1) of the Act, 21 U.S.C.section 360bbb-3(b)(1), unless the authorization is terminated  or revoked sooner.       Influenza A by PCR NEGATIVE NEGATIVE Final   Influenza B by PCR NEGATIVE NEGATIVE Final    Comment: (NOTE) The Xpert Xpress SARS-CoV-2/FLU/RSV plus assay is intended as an aid in the diagnosis of influenza from Nasopharyngeal swab specimens and should not be used as a sole basis for treatment. Nasal washings and aspirates are unacceptable for Xpert Xpress SARS-CoV-2/FLU/RSV testing.  Fact Sheet for  Patients: BloggerCourse.comhttps://www.fda.gov/media/152166/download  Fact Sheet for Healthcare Providers: SeriousBroker.ithttps://www.fda.gov/media/152162/download  This test is not yet approved or cleared by the [place] FDA and has been authorized for detection and/or diagnosis of SARS-CoV-2 by FDA under an Emergency Use Authorization (EUA). This EUA will remain in effect (meaning this test can be used) for the duration of the COVID-19 declaration under Section 564(b)(1) of the Act, 21 U.S.C. section 360bbb-3(b)(1), unless the authorization is terminated or revoked.  Performed at [facility], [address] [place], [place] [address]      Radiology Studies: No results found.   LOS: 6 days   Huey Bienenstockawood Wei Newbrough, MD [facility]  01/28/2021, 12:25 PM

## 2021-01-29 ENCOUNTER — Encounter (HOSPITAL_COMMUNITY): Payer: Self-pay | Admitting: Surgery

## 2021-01-29 ENCOUNTER — Encounter (HOSPITAL_COMMUNITY)
Admission: EM | Disposition: A | Discharge status: Home Health | Payer: Self-pay | Source: Home / Self Care | Attending: Internal Medicine

## 2021-01-29 DIAGNOSIS — I96 Gangrene, not elsewhere classified: Secondary | ICD-10-CM

## 2021-01-29 DIAGNOSIS — I70235 Atherosclerosis of native arteries of right leg with ulceration of other part of foot: Secondary | ICD-10-CM

## 2021-01-29 DIAGNOSIS — I739 Peripheral vascular disease, unspecified: Secondary | ICD-10-CM

## 2021-01-29 HISTORY — PX: ABDOMINAL AORTOGRAM W/LOWER EXTREMITY: CATH118223

## 2021-01-29 HISTORY — PX: PERIPHERAL VASCULAR BALLOON ANGIOPLASTY: CATH118281

## 2021-01-29 LAB — CBC
HCT: 31.7 % — ABNORMAL LOW (ref 39.0–52.0)
Hemoglobin: 10 g/dL — ABNORMAL LOW (ref 13.0–17.0)
MCH: 26.7 pg (ref 26.0–34.0)
MCHC: 31.5 g/dL (ref 30.0–36.0)
MCV: 84.8 fL (ref 80.0–100.0)
Platelets: 374 10*3/uL (ref 150–400)
RBC: 3.74 MIL/uL — ABNORMAL LOW (ref 4.22–5.81)
RDW: 13.2 % (ref 11.5–15.5)
WBC: 7.9 10*3/uL (ref 4.0–10.5)
nRBC: 0 % (ref 0.0–0.2)

## 2021-01-29 LAB — BASIC METABOLIC PANEL
Anion gap: 8 (ref 5–15)
BUN: 21 mg/dL — ABNORMAL HIGH (ref 6–20)
CO2: 26 mmol/L (ref 22–32)
Calcium: 9.7 mg/dL (ref 8.9–10.3)
Chloride: 103 mmol/L (ref 98–111)
Creatinine, Ser: 0.75 mg/dL (ref 0.61–1.24)
GFR, Estimated: >60 mL/min (ref >60)
Glucose, Bld: 213 mg/dL — ABNORMAL HIGH (ref 70–99)
Potassium: 3.9 mmol/L (ref 3.5–5.1)
Sodium: 137 mmol/L (ref 135–145)

## 2021-01-29 LAB — GLUCOSE, CAPILLARY
Glucose-Capillary: 209 mg/dL — ABNORMAL HIGH (ref 70–99)
Glucose-Capillary: 164 mg/dL — ABNORMAL HIGH (ref 70–99)
Glucose-Capillary: 185 mg/dL — ABNORMAL HIGH (ref 70–99)
Glucose-Capillary: 200 mg/dL — ABNORMAL HIGH (ref 70–99)

## 2021-01-29 LAB — POCT ACTIVATED CLOTTING TIME: Activated Clotting Time: 225 seconds

## 2021-01-29 SURGERY — ABDOMINAL AORTOGRAM W/LOWER EXTREMITY
Anesthesia: LOCAL | Laterality: Right

## 2021-01-29 MED ORDER — HEPARIN (PORCINE) IN NACL 1000-0.9 UT/500ML-% IV SOLN
INTRAVENOUS | Status: AC
  Filled 2021-01-29: qty 500

## 2021-01-29 MED ORDER — HEPARIN (PORCINE) IN NACL 1000-0.9 UT/500ML-% IV SOLN
INTRAVENOUS | Status: DC | PRN
  Administered 2021-01-29 (×2): 500 mL

## 2021-01-29 MED ORDER — SODIUM CHLORIDE 0.9 % IV SOLN: INTRAVENOUS | Status: DC

## 2021-01-29 MED ORDER — ACETAMINOPHEN 325 MG PO TABS: 650.0000 mg | ORAL_TABLET | ORAL | Status: DC | PRN

## 2021-01-29 MED ORDER — CLOPIDOGREL BISULFATE 75 MG PO TABS
75.0000 mg | ORAL_TABLET | Freq: Every day | ORAL | Status: DC
  Administered 2021-01-30 – 2021-02-02 (×4): 75 mg via ORAL
  Filled 2021-01-29 (×5): qty 1

## 2021-01-29 MED ORDER — MIDAZOLAM HCL 2 MG/2ML IJ SOLN
INTRAMUSCULAR | Status: AC
  Filled 2021-01-29: qty 2

## 2021-01-29 MED ORDER — LABETALOL HCL 5 MG/ML IV SOLN: 10.0000 mg | INTRAVENOUS | Status: DC | PRN

## 2021-01-29 MED ORDER — LIDOCAINE HCL (PF) 1 % IJ SOLN
INTRAMUSCULAR | Status: DC | PRN
  Administered 2021-01-29: 12 mL

## 2021-01-29 MED ORDER — HEPARIN SODIUM (PORCINE) 1000 UNIT/ML IJ SOLN
INTRAMUSCULAR | Status: DC | PRN
  Administered 2021-01-29: 6000 [IU] via INTRAVENOUS

## 2021-01-29 MED ORDER — CHLORHEXIDINE GLUCONATE CLOTH 2 % EX PADS: 6.0000 | MEDICATED_PAD | Freq: Once | CUTANEOUS | Status: DC

## 2021-01-29 MED ORDER — MIDAZOLAM HCL 2 MG/2ML IJ SOLN
INTRAMUSCULAR | Status: DC | PRN
  Administered 2021-01-29: 1 mg via INTRAVENOUS

## 2021-01-29 MED ORDER — SODIUM CHLORIDE 0.9% FLUSH
3.0000 mL | Freq: Two times a day (BID) | INTRAVENOUS | Status: DC
  Administered 2021-01-30 – 2021-02-02 (×4): 3 mL via INTRAVENOUS

## 2021-01-29 MED ORDER — FENTANYL CITRATE (PF) 100 MCG/2ML IJ SOLN
INTRAMUSCULAR | Status: AC
  Filled 2021-01-29: qty 2

## 2021-01-29 MED ORDER — SODIUM CHLORIDE 0.9% FLUSH: 3.0000 mL | INTRAVENOUS | Status: DC | PRN

## 2021-01-29 MED ORDER — SODIUM CHLORIDE 0.9 % IV SOLN: 250.0000 mL | INTRAVENOUS | Status: DC | PRN

## 2021-01-29 MED ORDER — ONDANSETRON HCL 4 MG/2ML IJ SOLN: 4.0000 mg | Freq: Four times a day (QID) | INTRAMUSCULAR | Status: DC | PRN

## 2021-01-29 MED ORDER — NITROGLYCERIN 1 MG/10 ML FOR IR/CATH LAB
INTRA_ARTERIAL | Status: AC
  Filled 2021-01-29: qty 10

## 2021-01-29 MED ORDER — HEPARIN SODIUM (PORCINE) 1000 UNIT/ML IJ SOLN
INTRAMUSCULAR | Status: AC
  Filled 2021-01-29: qty 1

## 2021-01-29 MED ORDER — FENTANYL CITRATE (PF) 100 MCG/2ML IJ SOLN
INTRAMUSCULAR | Status: DC | PRN
  Administered 2021-01-29: 50 ug via INTRAVENOUS

## 2021-01-29 MED ORDER — CHLORHEXIDINE GLUCONATE CLOTH 2 % EX PADS
6.0000 | MEDICATED_PAD | Freq: Once | CUTANEOUS | Status: AC
  Administered 2021-01-30: 6 via TOPICAL

## 2021-01-29 MED ORDER — LIDOCAINE HCL (PF) 1 % IJ SOLN
INTRAMUSCULAR | Status: AC
  Filled 2021-01-29: qty 30

## 2021-01-29 MED ORDER — SODIUM CHLORIDE 0.9 % WEIGHT BASED INFUSION: 1.0000 mL/kg/h | INTRAVENOUS | Status: AC

## 2021-01-29 MED ORDER — HYDRALAZINE HCL 20 MG/ML IJ SOLN
5.0000 mg | INTRAMUSCULAR | Status: DC | PRN
Start: 2021-01-29 — End: 2021-02-02

## 2021-01-29 MED ORDER — IODIXANOL 320 MG/ML IV SOLN
INTRAVENOUS | Status: DC | PRN
  Administered 2021-01-29: 140 mL

## 2021-01-29 MED ORDER — NITROGLYCERIN 1 MG/10 ML FOR IR/CATH LAB
INTRA_ARTERIAL | Status: DC | PRN
  Administered 2021-01-29: 200 ug via INTRA_ARTERIAL

## 2021-01-29 MED ORDER — CHLORHEXIDINE GLUCONATE CLOTH 2 % EX PADS
6.0000 | MEDICATED_PAD | Freq: Once | CUTANEOUS | Status: AC
  Administered 2021-01-29: 6 via TOPICAL

## 2021-01-29 SURGICAL SUPPLY — 18 items
BALLN STERLING OTW 3X100X150 (BALLOONS) ×2
BALLOON STERLING OTW 3X100X150 (BALLOONS) ×1 IMPLANT
CATH OMNI FLUSH 5F 65CM (CATHETERS) ×2 IMPLANT
DCB RANGER 4.0X40 135 (BALLOONS) ×1 IMPLANT
DEVICE VASC CLSR CELT ART 6 (Vascular Products) ×2 IMPLANT
KIT ENCORE 26 ADVANTAGE (KITS) ×2 IMPLANT
KIT MICROPUNCTURE NIT STIFF (SHEATH) ×2 IMPLANT
KIT PV (KITS) ×2 IMPLANT
RANGER DCB 4.0X40 135 (BALLOONS) ×2
SHEATH PINNACLE 5F 10CM (SHEATH) ×4 IMPLANT
SHEATH PINNACLE 6F 10CM (SHEATH) ×2 IMPLANT
SHEATH PINNACLE ST 6F 45CM (SHEATH) ×2 IMPLANT
SHEATH PROBE COVER 6X72 (BAG) ×2 IMPLANT
SYR MEDRAD MARK V 150ML (SYRINGE) ×2 IMPLANT
TRANSDUCER W/STOPCOCK (MISCELLANEOUS) ×2 IMPLANT
TRAY PV CATH (CUSTOM PROCEDURE TRAY) ×2 IMPLANT
WIRE G V18X300CM (WIRE) ×2 IMPLANT
WIRE STARTER BENTSON 035X150 (WIRE) ×2 IMPLANT

## 2021-01-29 NOTE — Progress Notes (Signed)
Subjective: 55 year old male admitted on January 22, 2021 for wounds on his fourth and fifth toes.  He is known to Dr. Samuella Cota who last saw him on January 07, 2021.  At that point he had skin maceration on the fourth interspace.  His symptoms progressed and he was admitted to the hospital.    Vascular performed angio with intervention today and found to have adequate blood flow to heal the amputation.  Denies any fevers or chills.  States he is ready for the surgery.  Objective: AAO x3, NAD On the right side the pulses are nonpalpable.gangrenous changes present to the entire fourth and fifth toes.  Malodor is present there is no purulence or drainage noted today.   No pain with calf compression  Assessment: Gangrene right foot  Plan: At this point he should have adequate circulation we will plan for right partial fourth and fifth ray amputations.  Discussed the surgery as well as the postoperative course.  We discussed alternatives, risks, complications.  Discussed risks of surgery including delayed or nonhealing, infection, further amputation, ongoing pain.  As well as general risks of surgery including stroke, heart attack, death.  He understands the risks and wishes to proceed.  We will plan for right foot partial fourth and fifth ray amputations tomorrow afternoon.  N.p.o. after midnight.  Ovid Curd, DPM

## 2021-01-29 NOTE — Progress Notes (Signed)
Inpatient Diabetes Program Recommendations  AACE/ADA: New Consensus Statement on Inpatient Glycemic Control (2015)  Target Ranges:  Prepandial:   less than 140 mg/dL      Peak postprandial:   less than 180 mg/dL (1-2 hours)      Critically ill patients:  140 - 180 mg/dL   Lab Results  Component Value Date   GLUCAP 209 (H) 01/29/2021   HGBA1C 9.9 (H) 12/26/2020    Review of Glycemic Control Results for [name], [name] (MRN [id number]) as of 01/29/2021 11:01  Ref. Range 01/28/2021 11:22 01/28/2021 16:36 01/28/2021 21:25 01/29/2021 07:24  Glucose-Capillary Latest Ref Range: 70 - 99 mg/dL 259 (H) 563 (H) 875 (H) 209 (H)   Diabetes history: Type 2 DM Outpatient Diabetes medications: Glipizide 10 mg BID, Basaglar 10 units QAM, Metformin 500 mg BID Current orders for Inpatient glycemic control: Novolog 0-9 units TID, Novolog 0-5 units QHS, Semglee 16 units QD Boost Breeze BID Inpatient Diabetes Program Recommendations:    Consider: -increasing Semglee to 20 units QD -Adding Novolog 3 units TID (assuming patient consuming >50% of meals) -May want to consider finding alternative to nutritional supplement as Boost Breeze contains >50 grams per serving.   Thanks, Lujean Rave, MSN, RNC-OB Diabetes Coordinator [phone] (8a-5p)

## 2021-01-29 NOTE — Op Note (Signed)
Patient name: Jensen Kilburg MRN: [id number] DOB: August 09, 1965 Sex: male  01/29/2021 Pre-operative Diagnosis: Right toe ulcer Post-operative diagnosis:  Same Surgeon:  [name] Procedure Performed:  1.  Ultrasound-guided access, left femoral artery  2.  Abdominal aortogram  3.  Right lower extremity runoff  4.  Drug-coated balloon angioplasty, right popliteal artery  5.  Balloon angioplasty, right posterior tibial artery  6.  Intra-arterial administration of nitroglycerin  7.  Conscious sedation, 66 minutes  8.  Closure device, Celt   Indications: This is a 55 year old gentleman with necrotic right fourth and fifth toes and abnormal Doppler studies he comes in today for further evaluation and possible invention  Procedure:  The patient was identified in the holding area and taken to room 8.  The patient was then placed supine on the table and prepped and draped in the usual sterile fashion.  A time out was called.  Conscious sedation was administered with the use of IV fentanyl and Versed under continuous physician and nurse monitoring.  Heart rate, blood pressure, and oxygen saturation were continuously monitored.  Total sedation time was 66 minutes.  Ultrasound was used to evaluate the left common femoral artery.  It was patent .  A digital ultrasound image was acquired.  A micropuncture needle was used to access the left common femoral artery under ultrasound guidance.  An 018 wire was advanced without resistance and a micropuncture sheath was placed.  The 018 wire was removed and a benson wire was placed.  The micropuncture sheath was exchanged for a 5 french sheath.  An omniflush catheter was advanced over the wire to the level of L-1.  An abdominal angiogram was obtained.  Next, using the omniflush catheter and a benson wire, the aortic bifurcation was crossed and the catheter was placed into theright external iliac artery and right runoff was obtained.   Findings:   Aortogram: No  significant renal artery stenosis.  The infrarenal abdominal aorta is widely patent without significant stenosis.  Bilateral common and external iliac arteries are widely patent.  Right Lower Extremity: The right common femoral and profundofemoral artery are widely patent without stenosis.  The superficial femoral artery is widely patent.  There is a focal stenosis approximately 25 mm in length within the above-knee popliteal artery.  The below-knee popliteal artery is widely patent.  The dominant runoff is the anterior tibial.  There were several stenoses throughout the posterior tibial artery, greater than 70%  Left Lower Extremity: Not evaluated due to contrast utilization  Intervention: After the above images were acquired the decision made to proceed with intervention.  Over an 035 wire, a 6 French 45 cm sheath was inserted.  The patient was fully heparinized.  I then used a 018 wire and a 3 x 100 Sterling balloon into the posterior tibial artery.  The wire was taken down below the ankle.  I treated the posterior tibial artery with primary balloon angioplasty using a 3 x 100 balloon.  Multiple overlapping inflations for 2 minutes were performed.  Follow-up imaging was performed.  The vessel appeared to be in spasm.  I removed the wire from the balloon and injected 300 mcg of nitroglycerin into the posterior tibial artery.  Attention was then turned towards the popliteal lesion.  I selected a 4 x 40 drug-coated Ranger balloon and treated this area at nominal pressure for 3 minutes.  Completion imaging revealed resolution of the stenosis.  Runoff was then performed.  The patient now has much  improved flow through the posterior tibial artery as well as the tear tibial artery which remains changed.  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  The anterior tibial and posterior tibial arteries.  He should have adequate blood flow for amputation.     Juleen China, M.D., [facility] [place] Office: [phone] Pager:  [phone]

## 2021-01-29 NOTE — Interval H&P Note (Signed)
History and Physical Interval Note:  01/29/2021 12:34 PM  [name]  has presented today for surgery, with the diagnosis of pad w/ ulcer.  The various methods of treatment have been discussed with the patient and family. After consideration of risks, benefits and other options for treatment, the patient has consented to  Procedure(s): ABDOMINAL AORTOGRAM W/LOWER EXTREMITY (N/A) as a surgical intervention.  The patient's history has been reviewed, patient examined, no change in status, stable for surgery.  I have reviewed the patient's chart and labs.  Questions were answered to the patient's satisfaction.     [name]

## 2021-01-29 NOTE — Progress Notes (Signed)
PROGRESS NOTE    [name]  ZDG:[id number] DOB: May 18, 1966 DOA: 01/22/2021 PCP: Joice Lofts, NP   Chief Complaint  Patient presents with   Wound Infection    Brief Narrative:  55 year old male with diabetes, hypertension and tobacco abuse, HLD, CAD status post CABG times 47/14/22 over the saphenous vein was harvested on his right leg and has developed infection of the wound on the side since then, followed by outpatient podiatry, wound was improving.  In the last week it has gotten worse with discoloration of the right fourth and fifth toes more ordered and worsening symptoms, last 24 hours prior to admission got worse.  Came to the ED work-up showed osteomyelitis and MRI podiatry was consulted and was admitted  Subjective:  -He denies any complaints today  Assessment & Plan:  Osteomyelitis and nonhealing wound on of toe of right foot in the setting of diabetes, PAD:  -Podiatry following, plan for amputation after revascularization . -Vascular surgery consulted, plan for angiogram today to determine further plans for revascularization . -New with broad-spectrum antibiotic vancomycin and Zosyn for now . -Continue with aspirin.    PVD -See above discussion.  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  Continue current long-acting insulin SSI Neurontin and monitor CBG as below, BG remains elevated, but I will hold on increasing Semglee given he will be n.p.o. after midnight for procedure tomorrow.   Recent Labs  Lab 01/28/21 1122 01/28/21 1636 01/28/21 2125 01/29/21 0724 01/29/21 1143  GLUCAP 193* 363* 230* 209* 164*    Hypertension controlled on metoprolol  Anxiety/depression mood is stable on BuSpar, Cymbalta and Topamax  Hyperlipidemia on Crestor  Orthostatic hypotension continue home midodrine monitor vitals  Anemia suspect in the setting of chronic disease.   Monitor hemoglobin  Severe protein calorie moderation augment diet as below  Diet Order             Diet NPO time specified Except for: Sips with Meds  Diet effective midnight                   Nutrition Problem: Severe Malnutrition Etiology: chronic illness (CAD s/p CABG) Signs/Symptoms: severe fat depletion, severe muscle depletion, energy intake < or equal to 75% for > or equal to 1 month, percent weight loss Interventions: Refer to RD note for recommendations Patient's Body mass index is 18.86 kg/m.  DVT prophylaxis: enoxaparin (LOVENOX) injection 40 mg Start: 01/23/21 0600 Code Status:   Code Status: Full Code  Family Communication: plan of care discussed with patient at bedside. Status is: Inpatient  Remains inpatient appropriate because:IV treatments appropriate due to intensity of illness or inability to take PO and Inpatient level of care appropriate due to severity of illness  Dispo: The patient is from: Home              Anticipated d/c is to: Home              Patient currently is not medically stable to d/c.   Difficult to place patient No       Unresulted Labs (From admission, onward)     Start     Ordered   01/29/21 2100  Vancomycin, trough  Once-Timed,   TIMED       Question:  Specimen collection method  Answer:  Lab=Lab collect   01/29/21 1124   01/29/21 0500  Creatinine, serum  (enoxaparin (LOVENOX)  CrCl >/= 30 ml/min)  Weekly,   R     Comments: while on enoxaparin therapy    01/23/21 0319            Medications reviewed:  Scheduled Meds:  [MAR Hold] (feeding supplement) PROSource Plus  30 mL Oral BID BM   [MAR Hold] aspirin  81 mg Oral Daily   [MAR Hold] busPIRone  10 mg Oral BID   [MAR Hold] diphenhydrAMINE  25 mg Oral Daily   [MAR Hold] DULoxetine  60 mg Oral q AM   [MAR Hold] enoxaparin (LOVENOX) injection  40 mg Subcutaneous Q24H   [MAR Hold] feeding supplement  1 Container Oral BID BM   [MAR Hold] ferrous sulfate  325 mg Oral Q  breakfast   [MAR Hold] gabapentin  300 mg Oral TID   [MAR Hold] insulin aspart  0-5 Units Subcutaneous QHS   [MAR Hold] insulin aspart  0-9 Units Subcutaneous TID WC   [MAR Hold] insulin glargine-yfgn  16 Units Subcutaneous Daily   [MAR Hold] metoprolol tartrate  12.5 mg Oral BID   [MAR Hold] midodrine  10 mg Oral TID WC   [MAR Hold] multivitamin with minerals  1 tablet Oral Daily   [MAR Hold] pantoprazole  40 mg Oral Daily   [MAR Hold] rosuvastatin  20 mg Oral Daily   [MAR Hold] topiramate  50 mg Oral BID   [MAR Hold] Vitamin D (Ergocalciferol)  50,000 Units Oral Q Fri   Continuous Infusions:  [MAR Hold] sodium chloride Stopped (01/24/21 1436)   sodium chloride 100 mL/hr at 01/29/21 1106   [MAR Hold] piperacillin-tazobactam (ZOSYN)  IV 3.375 g (01/29/21 0607)   [MAR Hold] vancomycin Stopped (01/28/21 2209)   Consultants -Podiatry Vascular  Procedures   Antimicrobials: Anti-infectives (From admission, onward)    Start     Dose/Rate Route Frequency Ordered Stop   01/23/21 2045  [MAR Hold]  vancomycin (VANCOREADY) IVPB 1250 mg/250 mL        (MAR Hold since Tue 01/29/2021 at 1225.Hold Reason: Transfer to a Procedural area)   1,250 mg 166.7 mL/hr over 90 Minutes Intravenous Every 24 hours 01/22/21 2224     01/23/21 0600  [MAR Hold]  piperacillin-tazobactam (ZOSYN) IVPB 3.375 g        (MAR Hold since Tue 01/29/2021 at 1225.Hold Reason: Transfer to a Procedural area)  See Hyperspace for full Linked Orders Report.   3.375 g 12.5 mL/hr over 240 Minutes Intravenous Every 8 hours 01/22/21 2034     01/22/21 2045  piperacillin-tazobactam (ZOSYN) IVPB 3.375 g       See Hyperspace for full Linked Orders Report.   3.375 g 100 mL/hr over 30 Minutes Intravenous  Once 01/22/21 2034 01/22/21 2118   01/22/21 2045  vancomycin (VANCOREADY) IVPB 1250 mg/250 mL  Status:  Discontinued        1,250 mg 166.7 mL/hr over 90 Minutes Intravenous  Once 01/22/21 2034 01/22/21 2224       Culture/Microbiology    Component Value Date/Time   SDES BLOOD SITE NOT SPECIFIED 01/22/2021 1701   SPECREQUEST  01/22/2021 1701    BOTTLES DRAWN AEROBIC AND ANAEROBIC Blood Culture results may not be optimal due to an inadequate volume of blood received in culture bottles   CULT  01/22/2021 1701    NO GROWTH 5 DAYS Performed at [facility], [address], [place], [place] [address]    REPTSTATUS 01/27/2021 FINAL 01/22/2021 1701    Other culture-see note  Objective: Vitals:  Today's Vitals   01/29/21 1344 01/29/21 1345 01/29/21 1349 01/29/21 1353  BP: 109/72  102/64 101/62  Pulse: 80  82 80  Resp: (!) 24  (!) 9 (!) 26  Temp:      TempSrc:      SpO2: 100%  100% 100%  Weight:      Height:      PainSc:  5       Intake/Output Summary (Last 24 hours) at 01/29/2021 1400 Last data filed at 01/29/2021 0100 Gross per 24 hour  Intake --  Output 675 ml  Net -675 ml   Filed Weights   01/22/21 1646 01/26/21 0552  Weight: 55.3 kg 54.6 kg   Weight change:   Intake/Output from previous day: 08/15 0701 - 08/16 0700 In: 360 [P.O.:360] Out: 675 [Urine:675] Intake/Output this shift: No intake/output data recorded. Filed Weights   01/22/21 1646 01/26/21 0552  Weight: 55.3 kg 54.6 kg   Examination:  Awake Alert, Oriented X 3, No new F.N deficits, Normal affect, extremely frail, appears much older than stated age. Symmetrical Chest wall movement, Good air movement bilaterally, CTAB RRR,No Gallops,Rubs or new Murmurs, No Parasternal Heave +ve B.Sounds, Abd Soft, No tenderness, No rebound - guarding or rigidity. Right Fourth/fifth toe gangrene     Data Reviewed: I have personally reviewed following labs and imaging studies CBC: Recent Labs  Lab 01/22/21 1655 01/23/21 0343 01/24/21 0257 01/26/21 0349 01/27/21 0234 01/29/21 0635  WBC 11.3* 8.0 9.5 12.5* 9.5 7.9  NEUTROABS 8.5*  --   --   --   --   --   HGB 10.7* 11.1* 9.7* 9.9* 9.8* 10.0*  HCT 32.7* 34.7*  29.3* 30.8* 30.5* 31.7*  MCV 86.3 86.1 84.9 86.3 85.2 84.8  PLT 468* 339 362 366 356 374   Basic Metabolic Panel: Recent Labs  Lab 01/25/21 0217 01/25/21 2015 01/26/21 0349 01/27/21 0234 01/29/21 0635  NA 135 133* 137 136 137  K 4.1 4.2 4.1 4.3 3.9  CL 102 102 104 104 103  CO2 25 22 23 25 26   GLUCOSE 295* 209* 206* 305* 213*  BUN 16 23* 19 22* 21*  CREATININE 1.01 0.80 0.87 0.90 0.75  CALCIUM 9.3 9.2 9.5 9.3 9.7   GFR: Estimated Creatinine Clearance: 80.6 mL/min (by C-G formula based on SCr of 0.75 mg/dL). Liver Function Tests: Recent Labs  Lab 01/22/21 1655 01/25/21 2015  AST 17 30  ALT 18 25  ALKPHOS 280* 328*  BILITOT 0.3 0.5  PROT 7.6 7.0  ALBUMIN 3.2* 2.7*   No results for input(s): LIPASE, AMYLASE in the last 168 hours. No results for input(s): AMMONIA in the last 168 hours. Coagulation Profile: No results for input(s): INR, PROTIME in the last 168 hours. Cardiac Enzymes: No results for input(s): CKTOTAL, CKMB, CKMBINDEX, TROPONINI in the last 168 hours. BNP (last 3 results) No results for input(s): PROBNP in the last 8760 hours. HbA1C: No results for input(s): HGBA1C in the last 72 hours. CBG: Recent Labs  Lab 01/28/21 1122 01/28/21 1636 01/28/21 2125 01/29/21 0724 01/29/21 1143  GLUCAP 193* 363* 230* 209* 164*   Lipid Profile: No results for input(s): CHOL, HDL, LDLCALC, TRIG, CHOLHDL, LDLDIRECT in the last 72 hours. Thyroid Function Tests: No results for input(s): TSH, T4TOTAL, FREET4, T3FREE, THYROIDAB in the last 72 hours. Anemia Panel: No results for input(s): VITAMINB12, FOLATE, FERRITIN, TIBC, IRON, RETICCTPCT in the last 72 hours. Sepsis Labs: Recent Labs  Lab 01/22/21 1701 01/22/21 2035  LATICACIDVEN 2.6* 1.8  Recent Results (from the past 240 hour(s))  Blood culture (routine single)     Status: None   Collection Time: 01/22/21  5:01 PM   Specimen: BLOOD  Result Value Ref Range Status   Specimen Description BLOOD SITE NOT  SPECIFIED  Final   Special Requests   Final    BOTTLES DRAWN AEROBIC AND ANAEROBIC Blood Culture results may not be optimal due to an inadequate volume of blood received in culture bottles   Culture   Final    NO GROWTH 5 DAYS Performed at [facility], [address] [place], [place] [address]    Report Status 01/27/2021 FINAL  Final  Resp Panel by RT-PCR (Flu A&B, Covid) Nasopharyngeal Swab     Status: None   Collection Time: 01/22/21  8:28 PM   Specimen: Nasopharyngeal Swab; Nasopharyngeal(NP) swabs in vial transport medium  Result Value Ref Range Status   SARS Coronavirus 2 by RT PCR NEGATIVE NEGATIVE Final    Comment: (NOTE) SARS-CoV-2 target nucleic acids are NOT DETECTED.  The SARS-CoV-2 RNA is generally detectable in upper respiratory specimens during the acute phase of infection. The lowest concentration of SARS-CoV-2 viral copies this assay can detect is 138 copies/mL. A negative result does not preclude SARS-Cov-2 infection and should not be used as the sole basis for treatment or other patient management decisions. A negative result may occur with  improper specimen collection/handling, submission of specimen other than nasopharyngeal swab, presence of viral mutation(s) within the areas targeted by this assay, and inadequate number of viral copies(<138 copies/mL). A negative result must be combined with clinical observations, patient history, and epidemiological information. The expected result is Negative.  Fact Sheet for Patients:  BloggerCourse.comhttps://www.fda.gov/media/152166/download  Fact Sheet for Healthcare Providers:  SeriousBroker.ithttps://www.fda.gov/media/152162/download  This test is no t yet approved or cleared by the [place] FDA and  has been authorized for detection and/or diagnosis of SARS-CoV-2 by FDA under an Emergency Use Authorization (EUA). This EUA will remain  in effect (meaning this test can be used) for the duration of the COVID-19 declaration under  Section 564(b)(1) of the Act, 21 U.S.C.section 360bbb-3(b)(1), unless the authorization is terminated  or revoked sooner.       Influenza A by PCR NEGATIVE NEGATIVE Final   Influenza B by PCR NEGATIVE NEGATIVE Final    Comment: (NOTE) The Xpert Xpress SARS-CoV-2/FLU/RSV plus assay is intended as an aid in the diagnosis of influenza from Nasopharyngeal swab specimens and should not be used as a sole basis for treatment. Nasal washings and aspirates are unacceptable for Xpert Xpress SARS-CoV-2/FLU/RSV testing.  Fact Sheet for Patients: BloggerCourse.comhttps://www.fda.gov/media/152166/download  Fact Sheet for Healthcare Providers: SeriousBroker.ithttps://www.fda.gov/media/152162/download  This test is not yet approved or cleared by the [place] FDA and has been authorized for detection and/or diagnosis of SARS-CoV-2 by FDA under an Emergency Use Authorization (EUA). This EUA will remain in effect (meaning this test can be used) for the duration of the COVID-19 declaration under Section 564(b)(1) of the Act, 21 U.S.C. section 360bbb-3(b)(1), unless the authorization is terminated or revoked.  Performed at [facility], [address] [place], [place] [address]      Radiology Studies: No results found.   LOS: 7 days   Huey Bienenstockawood Eshal Propps, MD [facility]  01/29/2021, 2:00 PM

## 2021-01-30 ENCOUNTER — Inpatient Hospital Stay (HOSPITAL_COMMUNITY): Payer: Medicaid Other | Admitting: Anesthesiology

## 2021-01-30 ENCOUNTER — Inpatient Hospital Stay (HOSPITAL_COMMUNITY): Payer: Medicaid Other

## 2021-01-30 ENCOUNTER — Encounter (HOSPITAL_COMMUNITY): Payer: Self-pay | Admitting: Internal Medicine

## 2021-01-30 ENCOUNTER — Encounter (HOSPITAL_COMMUNITY)
Admission: EM | Disposition: A | Discharge status: Home Health | Payer: Self-pay | Source: Home / Self Care | Attending: Internal Medicine

## 2021-01-30 DIAGNOSIS — I96 Gangrene, not elsewhere classified: Secondary | ICD-10-CM

## 2021-01-30 HISTORY — PX: AMPUTATION: SHX166

## 2021-01-30 LAB — GLUCOSE, CAPILLARY
Glucose-Capillary: 170 mg/dL — ABNORMAL HIGH (ref 70–99)
Glucose-Capillary: 124 mg/dL — ABNORMAL HIGH (ref 70–99)
Glucose-Capillary: 125 mg/dL — ABNORMAL HIGH (ref 70–99)
Glucose-Capillary: 161 mg/dL — ABNORMAL HIGH (ref 70–99)

## 2021-01-30 LAB — SURGICAL PCR SCREEN
MRSA, PCR: NEGATIVE
Staphylococcus aureus: NEGATIVE

## 2021-01-30 LAB — CBC
HCT: 31.5 % — ABNORMAL LOW (ref 39.0–52.0)
Hemoglobin: 10.1 g/dL — ABNORMAL LOW (ref 13.0–17.0)
MCH: 27.3 pg (ref 26.0–34.0)
MCHC: 32.1 g/dL (ref 30.0–36.0)
MCV: 85.1 fL (ref 80.0–100.0)
Platelets: 355 10*3/uL (ref 150–400)
RBC: 3.7 MIL/uL — ABNORMAL LOW (ref 4.22–5.81)
RDW: 13.5 % (ref 11.5–15.5)
WBC: 9.3 10*3/uL (ref 4.0–10.5)
nRBC: 0 % (ref 0.0–0.2)

## 2021-01-30 LAB — BASIC METABOLIC PANEL
Anion gap: 7 (ref 5–15)
BUN: 22 mg/dL — ABNORMAL HIGH (ref 6–20)
CO2: 25 mmol/L (ref 22–32)
Calcium: 9.4 mg/dL (ref 8.9–10.3)
Chloride: 105 mmol/L (ref 98–111)
Creatinine, Ser: 0.74 mg/dL (ref 0.61–1.24)
GFR, Estimated: >60 mL/min (ref >60)
Glucose, Bld: 230 mg/dL — ABNORMAL HIGH (ref 70–99)
Potassium: 3.9 mmol/L (ref 3.5–5.1)
Sodium: 137 mmol/L (ref 135–145)

## 2021-01-30 LAB — VANCOMYCIN, TROUGH: Vancomycin Tr: 11 ug/mL — ABNORMAL LOW (ref 15–20)

## 2021-01-30 IMAGING — DX DG FOOT 2V*R*
2 series · 2 of 2 positions shown · non-contrast
Comparison: [DATE].

CLINICAL DATA: Postoperative status.

EXAM:
RIGHT FOOT - 2 VIEW

[foot]
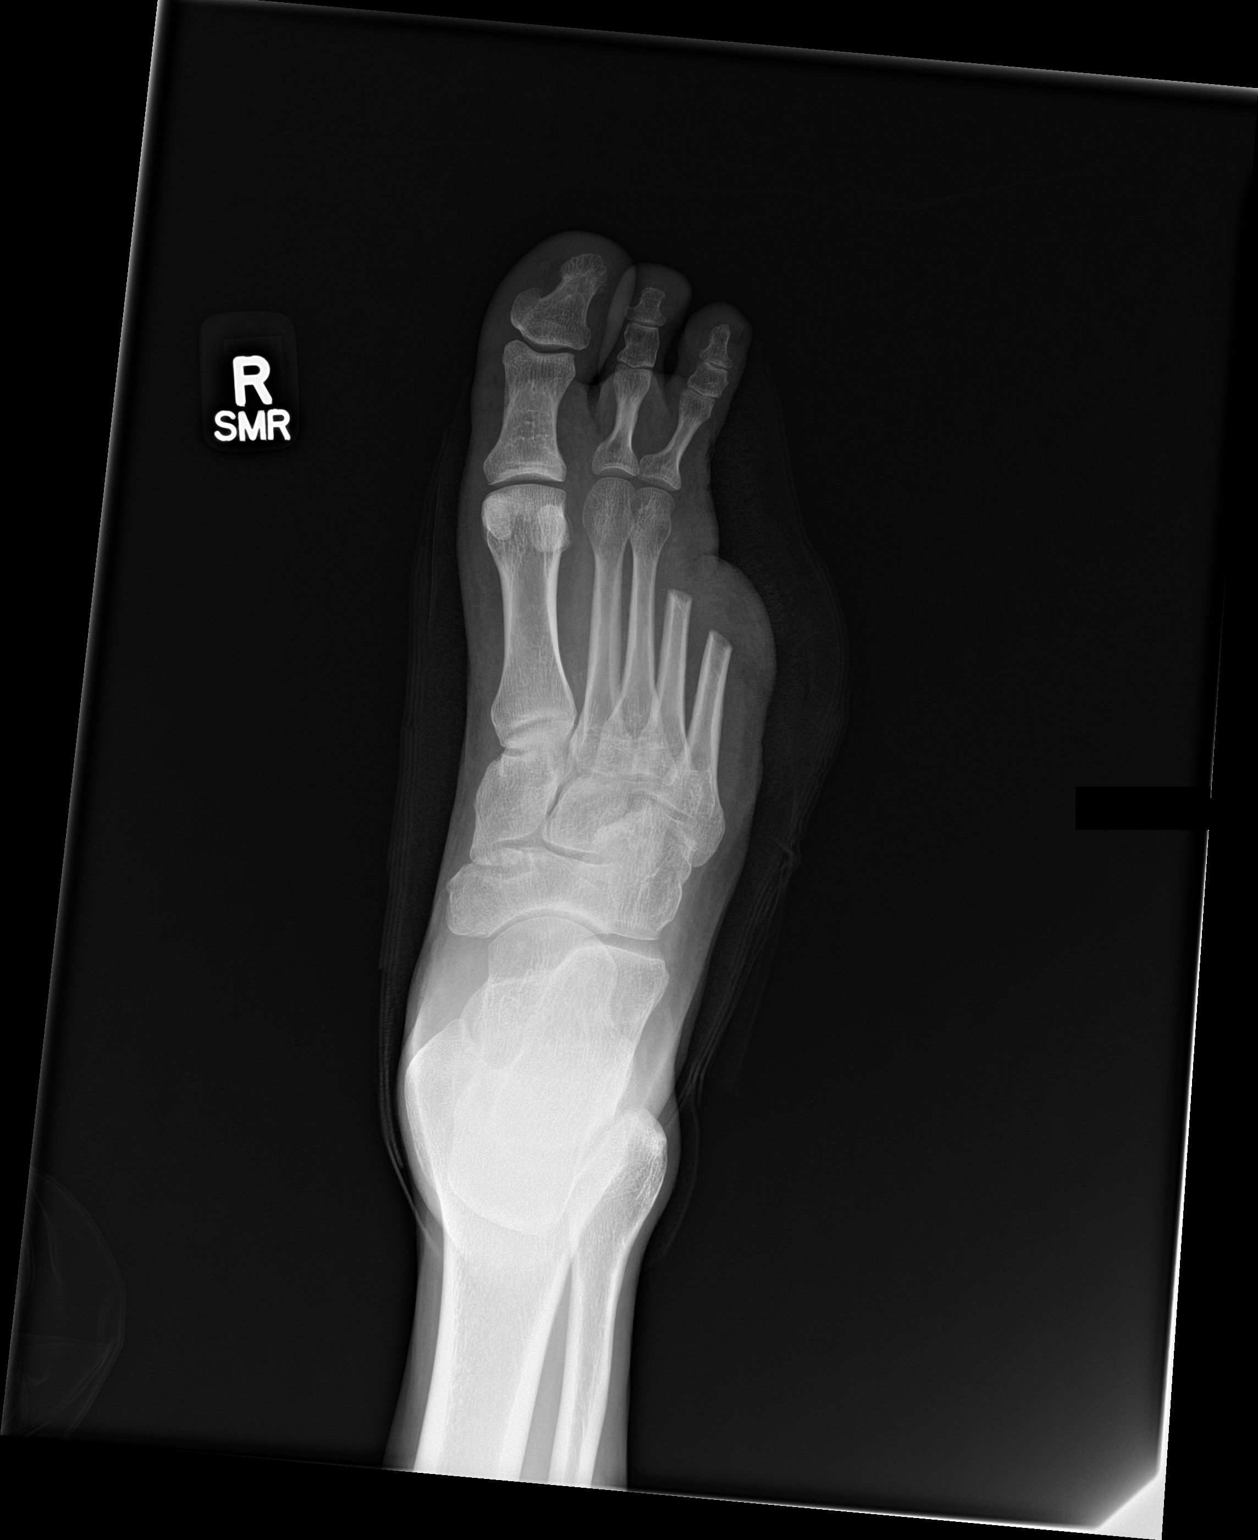

[leg]
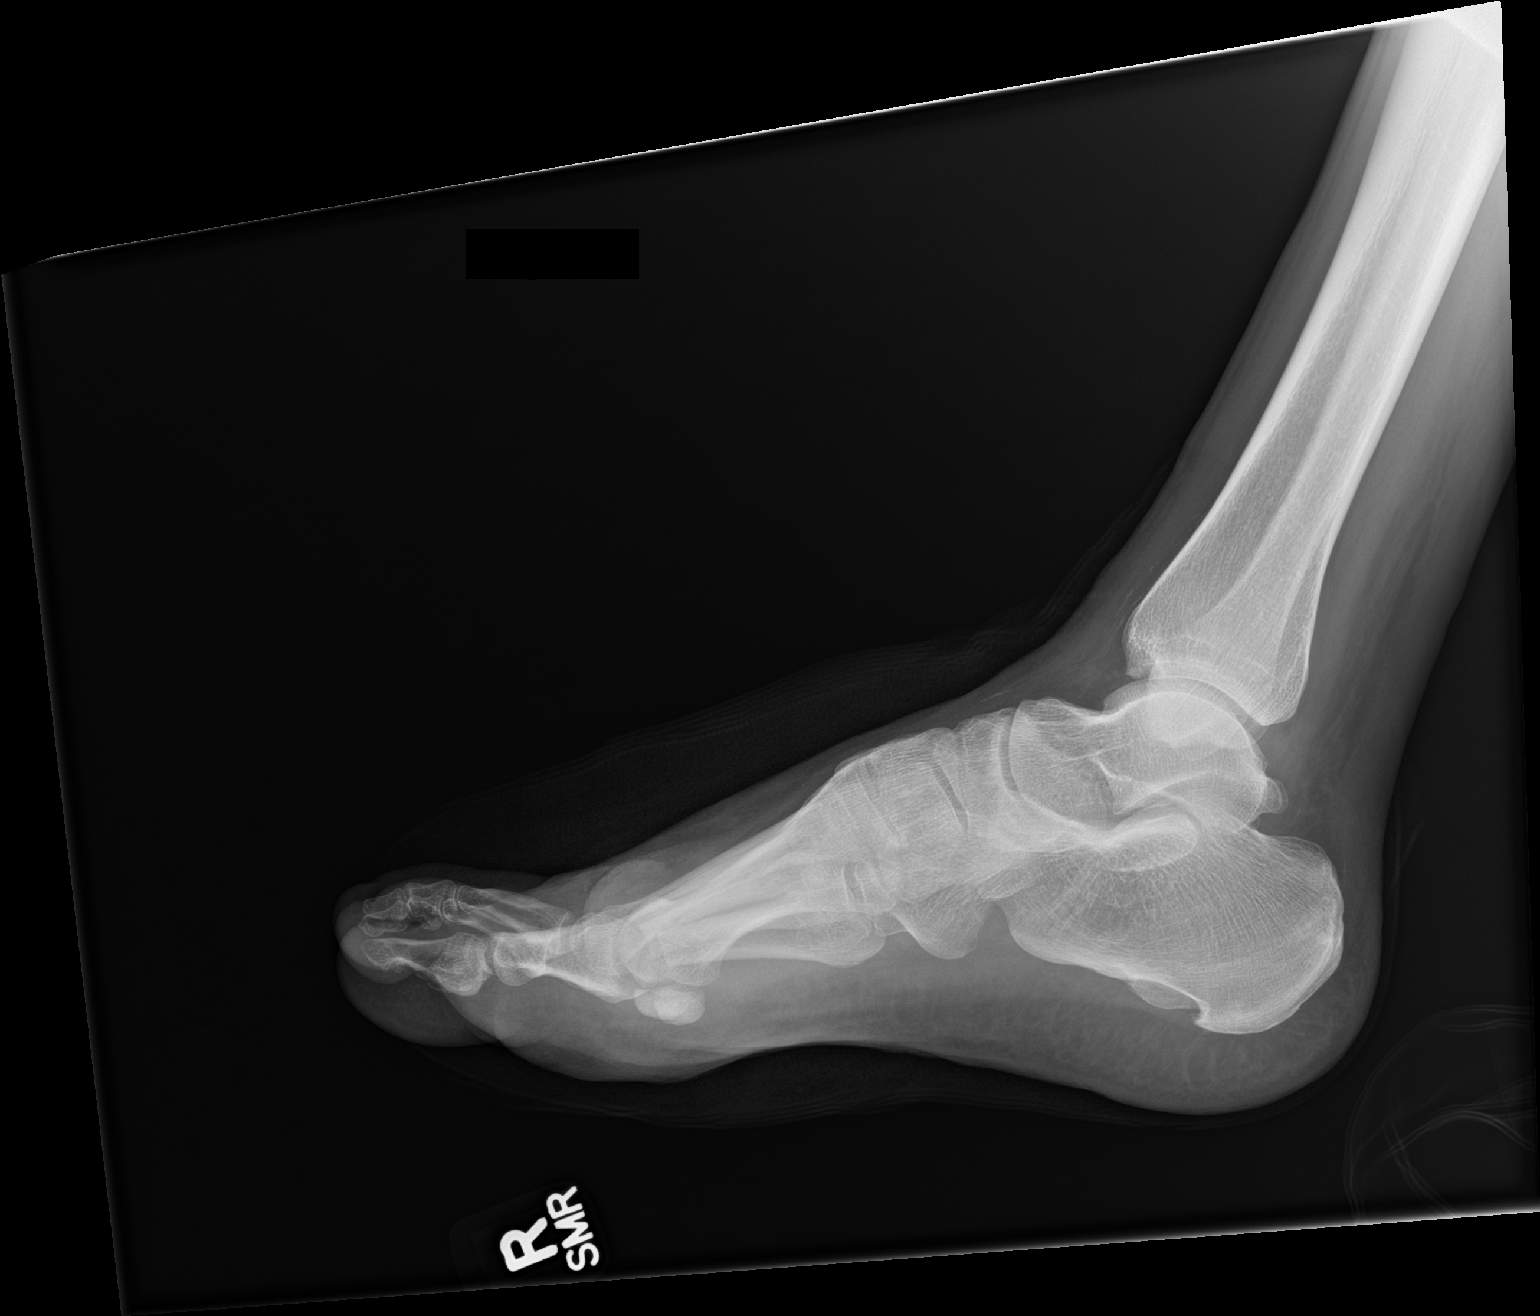

[2 of 2 positions shown; findings below may reference images not displayed]

FINDINGS: Status post surgical amputation of the distal portions of the fourth
and fifth metatarsals as well as the phalanges of the fourth and
fifth toes. No other bony abnormality is noted. Remaining joint
spaces are unremarkable.
IMPRESSION: Status post surgical amputation of distal portions of fourth and
fifth metatarsals and phalanges of associated toes.

## 2021-01-30 IMAGING — CT CT MAXILLOFACIAL W/O CM
4 of 8 series · 16 of 47 positions shown, 19 images · non-contrast
Comparison: [DATE] CT head

CLINICAL DATA: Fall, facial trauma

EXAM:
CT HEAD WITHOUT CONTRAST
CT MAXILLOFACIAL WITHOUT CONTRAST
TECHNIQUE: Multidetector CT imaging of the head and maxillofacial structures
were performed using the standard protocol without intravenous
contrast. Multiplanar CT image reconstructions of the maxillofacial
structures were also generated.

[Series 4: st thins (person_name) · axial · 0.35mm/px · z∈[-223,-99]mm · 9 of 223 slices shown, 12 images]
[im 23/223  brain]
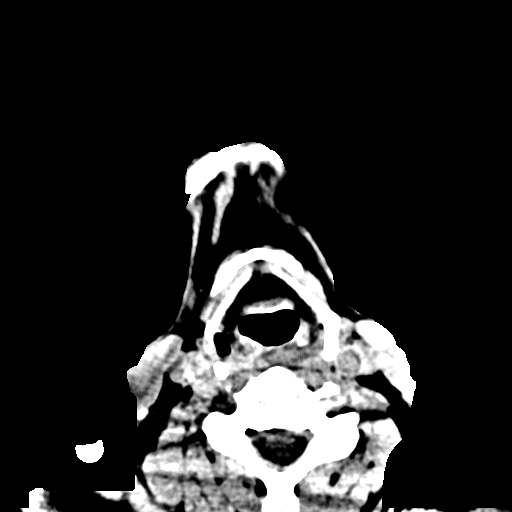
[im 23/223  bone]
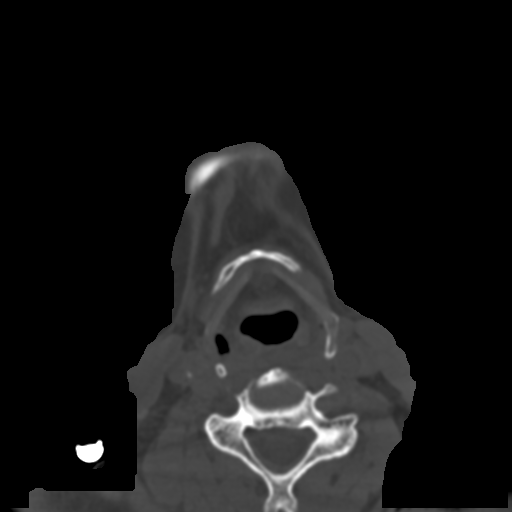
[im 45/223  bone]
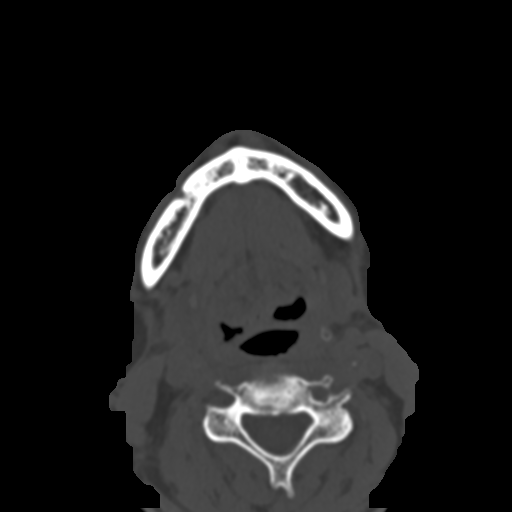
[im 67/223  bone]
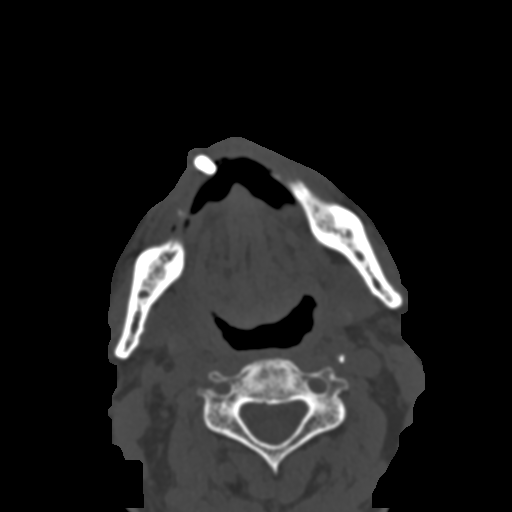
[im 89/223  bone]
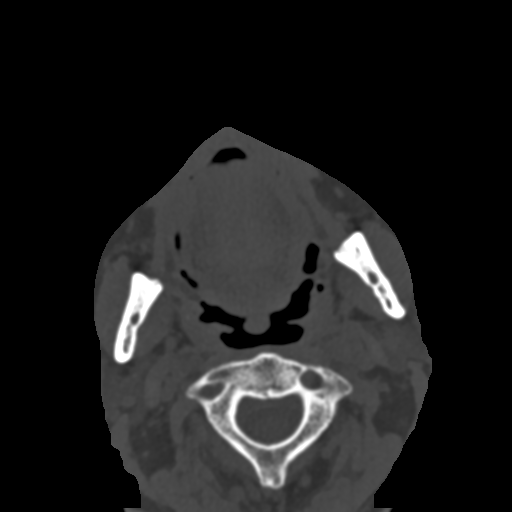
[im 112/223  brain]
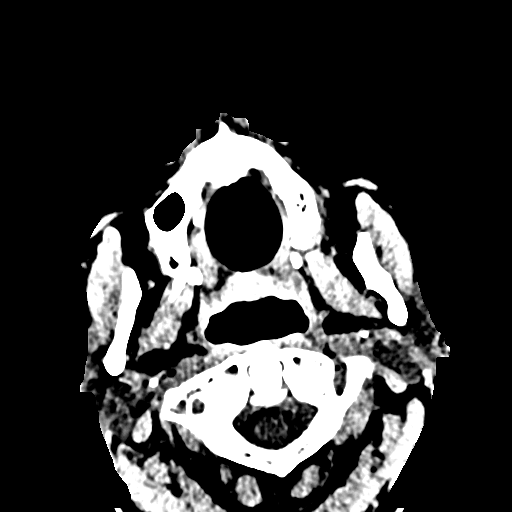
[im 112/223  bone]
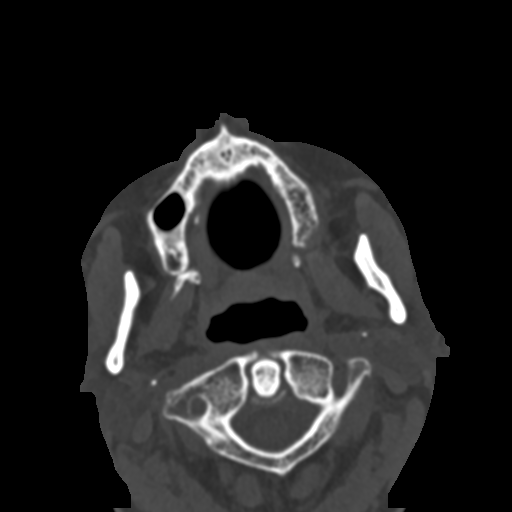
[im 134/223  bone]
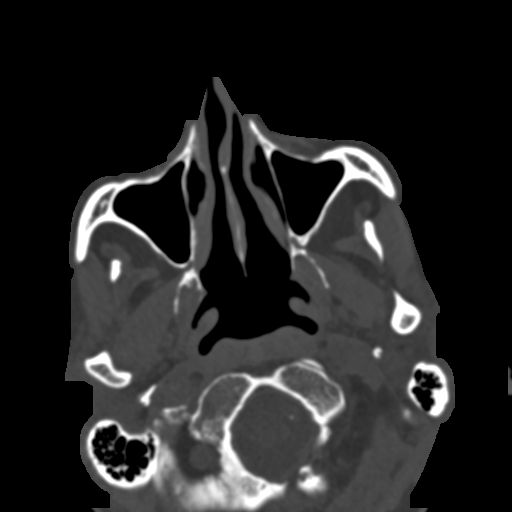
[im 156/223  bone]
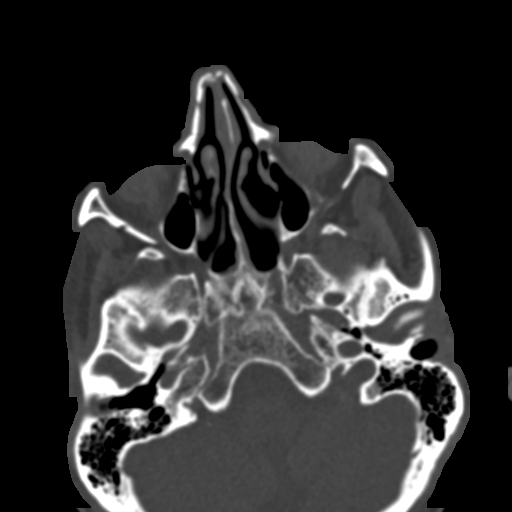
[im 178/223  bone]
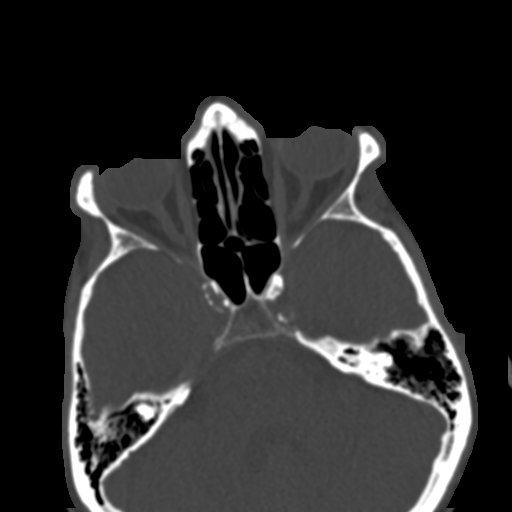
[im 200/223  brain]
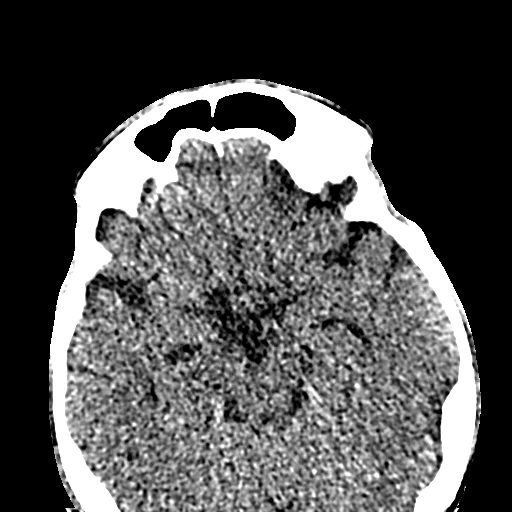
[im 200/223  bone]
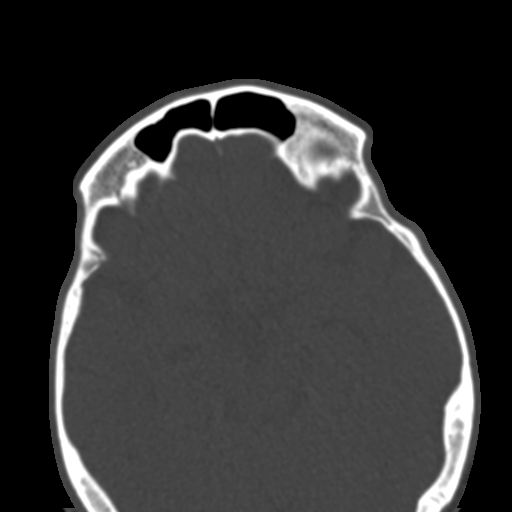

[Series 6: bone thins (person_name) · axial · 0.35mm/px · z∈[-223,-207]mm · 2 of 223 slices shown]
[im 23/223  bone]
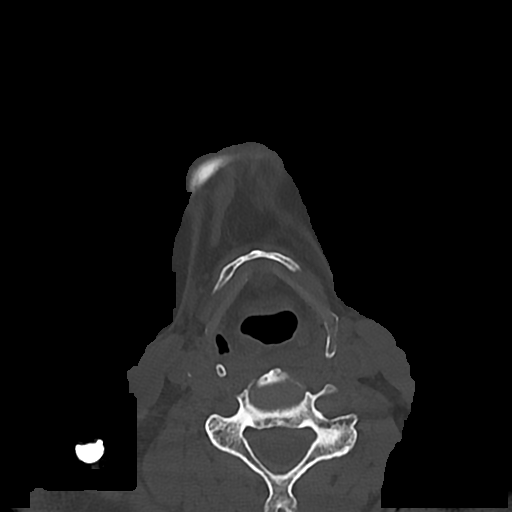
[im 45/223  bone]
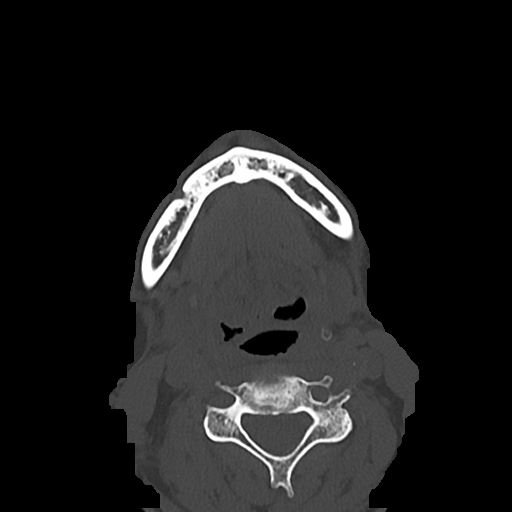

[Series 7: (person_name) · coronal · 0.31mm/px · 3 of 84 slices shown]
[im 21/84  bone]
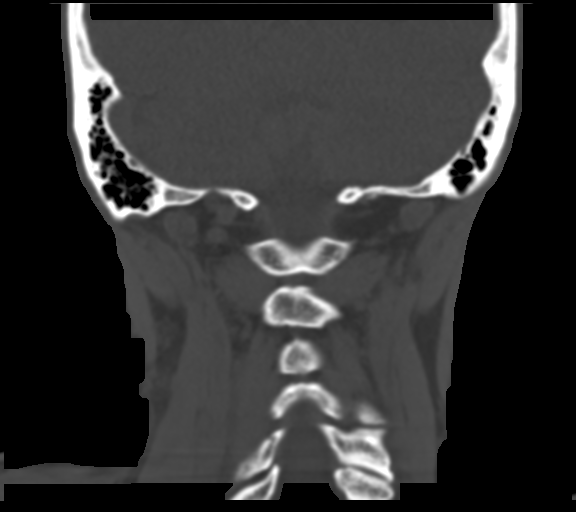
[im 42/84  bone]
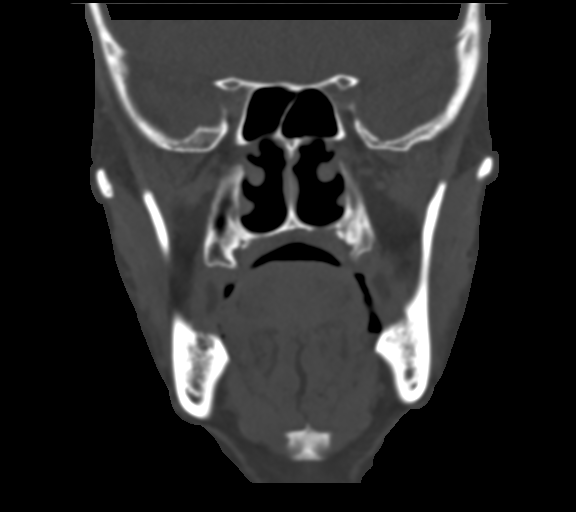
[im 63/84  bone]
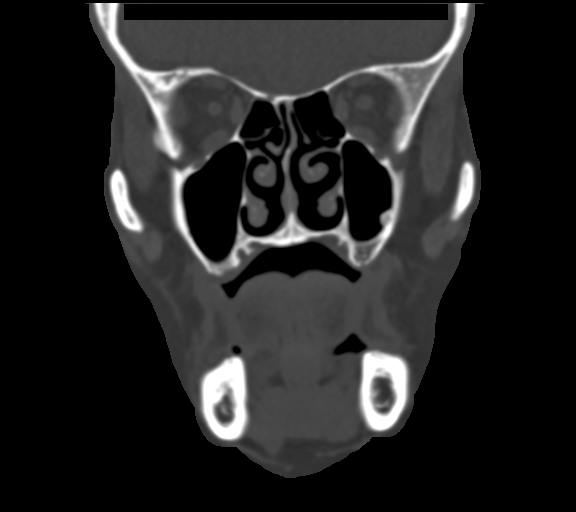

[Series 10: bone sag (person_name) · sagittal · 0.31mm/px · 2 of 91 slices shown]
[im 31/91  bone]
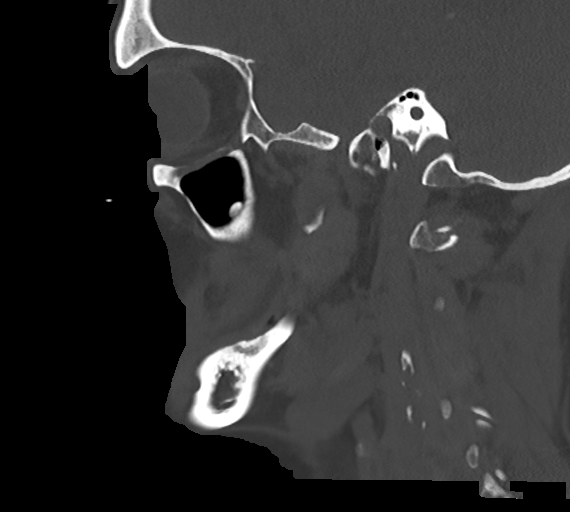
[im 61/91  bone]
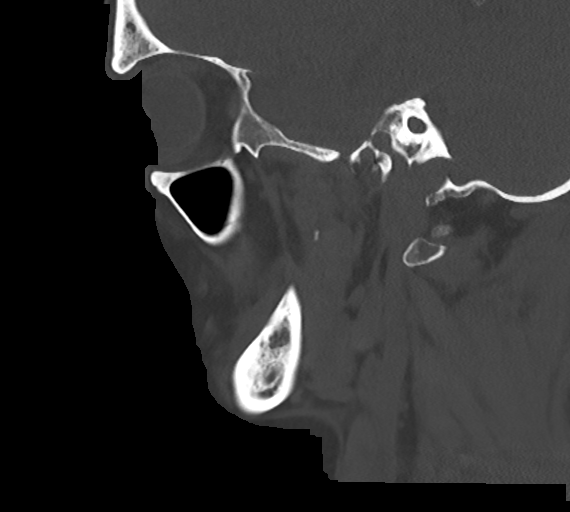

[16 of 47 positions shown; findings below may reference images not displayed]

FINDINGS: CT HEAD FINDINGS

Brain: No evidence of acute infarction, hemorrhage, hydrocephalus,
extra-axial collection or mass lesion/mass effect.

Vascular: No hyperdense vessels. Calcifications in the intracranial
internal carotid and vertebral arteries.

Skull: Normal. Negative for fracture or focal lesion.

Other: None.

CT MAXILLOFACIAL FINDINGS

Osseous: No fracture or mandibular dislocation. No destructive
process.

Orbits: Negative. No traumatic or inflammatory finding.

Sinuses: Clear.

Soft tissues: No significant hematoma.
IMPRESSION: 1. No acute intracranial process.
2. No acute facial bone fracture.

## 2021-01-30 SURGERY — AMPUTATION, FOOT, RAY
Anesthesia: Regional | Laterality: Right

## 2021-01-30 MED ORDER — 0.9 % SODIUM CHLORIDE (POUR BTL) OPTIME
TOPICAL | Status: DC | PRN
  Administered 2021-01-30: 1000 mL

## 2021-01-30 MED ORDER — VANCOMYCIN HCL 1500 MG/300ML IV SOLN
1500.0000 mg | INTRAVENOUS | Status: DC
  Administered 2021-01-30 – 2021-01-31 (×2): 1500 mg via INTRAVENOUS
  Filled 2021-01-30 (×3): qty 300

## 2021-01-30 MED ORDER — FENTANYL CITRATE (PF) 100 MCG/2ML IJ SOLN
INTRAMUSCULAR | Status: AC
  Administered 2021-01-30: 50 ug via INTRAVENOUS
  Filled 2021-01-30: qty 2

## 2021-01-30 MED ORDER — CHLORHEXIDINE GLUCONATE 0.12 % MT SOLN
15.0000 mL | Freq: Once | OROMUCOSAL | Status: AC
  Administered 2021-01-30: 15 mL via OROMUCOSAL
  Filled 2021-01-30: qty 15

## 2021-01-30 MED ORDER — LACTATED RINGERS IV SOLN: INTRAVENOUS | Status: DC

## 2021-01-30 MED ORDER — PROPOFOL 1000 MG/100ML IV EMUL
INTRAVENOUS | Status: AC
  Filled 2021-01-30: qty 100

## 2021-01-30 MED ORDER — LIDOCAINE HCL (CARDIAC) PF 100 MG/5ML IV SOSY
PREFILLED_SYRINGE | INTRAVENOUS | Status: DC | PRN
  Administered 2021-01-30: 60 mg via INTRATRACHEAL

## 2021-01-30 MED ORDER — PHENYLEPHRINE HCL (PRESSORS) 10 MG/ML IV SOLN
INTRAVENOUS | Status: DC | PRN
  Administered 2021-01-30 (×4): 100 ug via INTRAVENOUS

## 2021-01-30 MED ORDER — ORAL CARE MOUTH RINSE: 15.0000 mL | Freq: Once | OROMUCOSAL | Status: AC

## 2021-01-30 MED ORDER — BUPIVACAINE HCL (PF) 0.5 % IJ SOLN
INTRAMUSCULAR | Status: AC
  Filled 2021-01-30: qty 30

## 2021-01-30 MED ORDER — ROPIVACAINE HCL 7.5 MG/ML IJ SOLN
INTRAMUSCULAR | Status: DC | PRN
  Administered 2021-01-30: 20 mL via PERINEURAL

## 2021-01-30 MED ORDER — PROPOFOL 500 MG/50ML IV EMUL
INTRAVENOUS | Status: DC | PRN
  Administered 2021-01-30: 50 ug/kg/min via INTRAVENOUS

## 2021-01-30 MED ORDER — LIDOCAINE HCL (PF) 1 % IJ SOLN
INTRAMUSCULAR | Status: AC
  Filled 2021-01-30: qty 30

## 2021-01-30 MED ORDER — FENTANYL CITRATE (PF) 100 MCG/2ML IJ SOLN: 50.0000 ug | Freq: Once | INTRAMUSCULAR | Status: AC

## 2021-01-30 SURGICAL SUPPLY — 47 items
BAG COUNTER SPONGE SURGICOUNT (BAG) ×2 IMPLANT
BAG SURGICOUNT SPONGE COUNTING (BAG) ×1
BANDAGE ESMARK 6X9 LF (GAUZE/BANDAGES/DRESSINGS) IMPLANT
BLADE AVERAGE 25MMX9MM (BLADE) ×1
BLADE AVERAGE 25X9 (BLADE) ×2 IMPLANT
BLADE SURG 10 STRL SS (BLADE) ×3 IMPLANT
BNDG COHESIVE 4X5 TAN ST LF (GAUZE/BANDAGES/DRESSINGS) ×3 IMPLANT
BNDG COHESIVE 4X5 TAN STRL (GAUZE/BANDAGES/DRESSINGS) ×3 IMPLANT
BNDG ESMARK 6X9 LF (GAUZE/BANDAGES/DRESSINGS)
BNDG GAUZE ELAST 4 BULKY (GAUZE/BANDAGES/DRESSINGS) ×3 IMPLANT
DECANTER SPIKE VIAL GLASS SM (MISCELLANEOUS) ×3 IMPLANT
DRAPE C-ARM MINI (DRAPES) IMPLANT
DRAPE U-SHAPE 47X51 STRL (DRAPES) ×6 IMPLANT
DRSG CURAD 3X16 NADH (PACKING) ×3 IMPLANT
DURAPREP 26ML APPLICATOR (WOUND CARE) ×3 IMPLANT
ELECT REM PT RETURN 9FT ADLT (ELECTROSURGICAL) ×3
ELECTRODE REM PT RTRN 9FT ADLT (ELECTROSURGICAL) ×1 IMPLANT
GAUZE SPONGE 4X4 12PLY STRL (GAUZE/BANDAGES/DRESSINGS) ×3 IMPLANT
GAUZE XEROFORM 1X8 LF (GAUZE/BANDAGES/DRESSINGS) ×3 IMPLANT
GAUZE XEROFORM 5X9 LF (GAUZE/BANDAGES/DRESSINGS) ×3 IMPLANT
GLOVE SURG ENC MOIS LTX SZ8 (GLOVE) ×6 IMPLANT
GOWN STRL REUS W/ TWL LRG LVL3 (GOWN DISPOSABLE) ×1 IMPLANT
GOWN STRL REUS W/ TWL XL LVL3 (GOWN DISPOSABLE) ×1 IMPLANT
GOWN STRL REUS W/TWL LRG LVL3 (GOWN DISPOSABLE) ×2
GOWN STRL REUS W/TWL XL LVL3 (GOWN DISPOSABLE) ×2
KIT BASIN OR (CUSTOM PROCEDURE TRAY) ×3 IMPLANT
KIT TURNOVER KIT B (KITS) ×3 IMPLANT
NEEDLE 25GX 5/8IN NON SAFETY (NEEDLE) ×3 IMPLANT
NS IRRIG 1000ML POUR BTL (IV SOLUTION) ×3 IMPLANT
PACK ORTHO EXTREMITY (CUSTOM PROCEDURE TRAY) ×3 IMPLANT
PAD ARMBOARD 7.5X6 YLW CONV (MISCELLANEOUS) ×6 IMPLANT
PAD CAST 4YDX4 CTTN HI CHSV (CAST SUPPLIES) ×1 IMPLANT
PADDING CAST COTTON 4X4 STRL (CAST SUPPLIES) ×2
SPONGE T-LAP 18X18 ~~LOC~~+RFID (SPONGE) ×3 IMPLANT
STAPLER VISISTAT 35W (STAPLE) ×3 IMPLANT
STOCKINETTE IMPERVIOUS LG (DRAPES) IMPLANT
SUT ETHILON 2 0 PSLX (SUTURE) IMPLANT
SUT PROLENE 3 0 PS 2 (SUTURE) ×6 IMPLANT
SWAB COLLECTION DEVICE MRSA (MISCELLANEOUS) IMPLANT
SWAB CULTURE ESWAB REG 1ML (MISCELLANEOUS) ×3 IMPLANT
SYR CONTROL 10ML LL (SYRINGE) ×3 IMPLANT
TOWEL GREEN STERILE (TOWEL DISPOSABLE) ×3 IMPLANT
TOWEL GREEN STERILE FF (TOWEL DISPOSABLE) ×3 IMPLANT
TUBE CONNECTING 12'X1/4 (SUCTIONS) ×1
TUBE CONNECTING 12X1/4 (SUCTIONS) ×2 IMPLANT
WATER STERILE IRR 1000ML POUR (IV SOLUTION) ×3 IMPLANT
YANKAUER SUCT BULB TIP NO VENT (SUCTIONS) ×3 IMPLANT

## 2021-01-30 NOTE — Progress Notes (Signed)
Orthopedic Tech Progress Note Patient Details:  [name] September 10, 1965 [id number]  Ortho Devices Type of Ortho Device: Darco shoe Ortho Device/Splint Location: RLE Ortho Device/Splint Interventions: Ordered   Post Interventions Patient Tolerated: Well Instructions Provided: Care of device  [name] 01/30/2021, 4:13 PM

## 2021-01-30 NOTE — Progress Notes (Signed)
  Progress Note    01/30/2021 7:45 AM 1 Day Post-Op  Subjective:  no complaints. Says he is ready to have toes amputated and get on with healing so he can go home   Vitals:   01/29/21 2206 01/30/21 0419  BP: 105/64 (!) 102/56  Pulse: 92 87  Resp: 14 18  Temp:  98.9 F (37.2 C)  SpO2: 99% 98%   Physical Exam: Cardiac:  regular Lungs:  non labored Extremities:  left femoral access site c/d/I. No swelling or hematoma RLE well perfused and warm 2+ femoral pulse, 2+ Dp. Right 4th and 5th toes unchanged. Dry gangrene Abdomen:  soft, non tender Neurologic: alert and oriented  CBC    Component Value Date/Time   WBC 9.3 01/30/2021 0145   RBC 3.70 (L) 01/30/2021 0145   HGB 10.1 (L) 01/30/2021 0145   HGB 9.4 (L) 01/15/2021 0000   HCT 31.5 (L) 01/30/2021 0145   HCT 28.9 (L) 01/15/2021 0000   PLT 355 01/30/2021 0145   PLT 569 (H) 01/15/2021 0000   MCV 85.1 01/30/2021 0145   MCV 87 01/15/2021 0000   MCH 27.3 01/30/2021 0145   MCHC 32.1 01/30/2021 0145   RDW 13.5 01/30/2021 0145   RDW 14.0 01/15/2021 0000   LYMPHSABS 1.7 01/22/2021 1655   MONOABS 0.8 01/22/2021 1655   EOSABS 0.1 01/22/2021 1655   BASOSABS 0.1 01/22/2021 1655    BMET    Component Value Date/Time   NA 137 01/30/2021 0145   NA 136 01/15/2021 0000   K 3.9 01/30/2021 0145   CL 105 01/30/2021 0145   CO2 25 01/30/2021 0145   GLUCOSE 230 (H) 01/30/2021 0145   BUN 22 (H) 01/30/2021 0145   BUN 26 (H) 01/15/2021 0000   CREATININE 0.74 01/30/2021 0145   CALCIUM 9.4 01/30/2021 0145   GFRNONAA >60 01/30/2021 0145   GFRAA  09/08/2008 1152    >60        The eGFR has been calculated using the MDRD equation. This calculation has not been validated in all clinical situations. eGFR's persistently <60 mL/min signify possible Chronic Kidney Disease.    INR    Component Value Date/Time   INR 1.6 (H) 12/27/2020 1410     Intake/Output Summary (Last 24 hours) at 01/30/2021 0745 Last data filed at 01/30/2021  0536 Gross per 24 hour  Intake 1375.31 ml  Output 650 ml  Net 725.31 ml     Assessment/Plan:  55 y.o. male is s/p Aortogram, RLE runoff with DCB angioplasty of Right Popliteal artery and balloon angioplasty of right posterior tibial artery 1 Day Post-Op   Adequate perfusion of right lower extremity post intervention Palpable Right DP pulse, foot warm and well perfused Left femoral access site c/d/I without swelling or hematoma Schedule to go to OR today by Podiatry for 4th and 5th toe amputations Continue Aspirin, Plavix, Statin   Karoline Caldwell, PA-C [facility] [phone] 01/30/2021 7:45 AM

## 2021-01-30 NOTE — Progress Notes (Signed)
Pharmacy Antibiotic Note  [name] is a 55 y.o. male admitted on 01/22/2021 with  wound infection .  Pharmacy has been consulted for vancomycin and zosyn dosing.  He continues on antibiotics (day 9) for R foot osteomyelitis and plans for toe amputation on 8/17 -blood cultures- neg -vancomycin trough= 11 (8/16), SCr= 0.7   Plan: Zosyn 3.375g IV q8h (4 hour infusion). Change Vancomycin to 1500 mg q24hr  Monitor renal function Obtain vancomycin levels as indicated   Height: 5\' 7"  (170.2 cm) Weight: 54.6 kg (120 lb 6.4 oz) IBW/kg (Calculated) : 66.1  Temp (24hrs), Avg:98.2 F (36.8 C), Min:97.7 F (36.5 C), Max:98.9 F (37.2 C)  Recent Labs  Lab 01/24/21 0257 01/25/21 0217 01/25/21 2015 01/26/21 0349 01/27/21 0234 01/29/21 0635 01/29/21 2033 01/30/21 0145  WBC 9.5  --   --  12.5* 9.5 7.9  --  9.3  CREATININE 0.86   < > 0.80 0.87 0.90 0.75  --  0.74  VANCOTROUGH  --   --   --   --   --   --  11*  --    < > = values in this interval not displayed.     Estimated Creatinine Clearance: 80.6 mL/min (by C-G formula based on SCr of 0.74 mg/dL).    Allergies  Allergen Reactions   Atorvastatin Other (See Comments)    Myalgias    Antimicrobials this admission: zosyn 8/9 >>  vancomycin 8/9 >>   Microbiology results: Blood- ngtd  Thank you for allowing pharmacy to be a part of this patient's care.  10/9, PharmD Clinical Pharmacist **Pharmacist phone directory can now be found on [email or web address] ([email or web address]).  Listed under [facility].

## 2021-01-30 NOTE — Progress Notes (Addendum)
Called to room by patient's spouse stating patient had fallen. Patient had been assisted to [facility] by staff; spouse had asked staff to step out and give patient privacy and she was going to stay in the room. Spouse stated to staff that she had turned her back and heard patient fall; when she turned around patient was on the floor. Patient was assisted back to bed by staff; vital signs were stable. Patient denied pain. Patient had a bleeding skin tear to the bridge of his nose. Dr. Randol Kern was notified and will order additional testing as appropriate.   01/30/21 1859  What Happened  Was fall witnessed? No  Was patient injured? Yes  Patient found on floor  Found by  (patient's spouse)  Stated prior activity bathroom-unassisted  Follow Up  MD notified Dr. Randol Kern  Time MD notified [phone]  Family notified Yes - comment (wife, Maralyn Sago, at bedside)  Time family notified 1859  Additional tests Yes-comment (MD ordering CT head and xray of face)  Simple treatment Dressing  Progress note created (see row info) Yes  Adult Fall Risk Assessment  Risk Factor Category (scoring not indicated) Fall has occurred during this admission (document High fall risk)  Patient Fall Risk Level High fall risk  Adult Fall Risk Interventions  Required Bundle Interventions *See Row Information* High fall risk - low, moderate, and high requirements implemented  Additional Interventions Use of appropriate toileting equipment (bedpan, BSC, etc.)  Screening for Fall Injury Risk (To be completed on HIGH fall risk patients) - Assessing Need for Floor Mats  Risk For Fall Injury- Criteria for Floor Mats Previous fall this admission  Will Implement Floor Mats Yes  Vitals  Temp 97.9 F (36.6 C)  Temp Source Oral  BP 117/71  MAP (mmHg) 84  BP Location Right Arm  BP Method Automatic  Patient Position (if appropriate) Lying  Pulse Rate 86  Pulse Rate Source Monitor  ECG Heart Rate 87  Resp 16  Oxygen Therapy  SpO2 100 %   O2 Device Room Air  Pain Assessment  Pain Scale 0-10  Pain Score 0  Patients Stated Pain Goal 0  Neurological  Neuro (WDL) WDL  Level of Consciousness Alert  Orientation Level Oriented X4  Glasgow Coma Scale  Eye Opening 4  Best Verbal Response (NON-intubated) 5  Best Motor Response 6  Glasgow Coma Scale Score 15  Musculoskeletal  Musculoskeletal (WDL) X  Assistive Device BSC  Weight Bearing Restrictions Yes  RLE Weight Bearing NWB  Integumentary  Integumentary (WDL) X  Skin Color Appropriate for ethnicity  Skin Condition Dry  Skin Integrity Other (Comment);Ecchymosis (new skin tear to bridge of nose, see LDA documentation)  Ecchymosis Location Nose  Ecchymosis Location Orientation Mid  Skin Turgor Non-tenting

## 2021-01-30 NOTE — Anesthesia Postprocedure Evaluation (Signed)
Anesthesia Post Note  Patient: [name]  Procedure(s) Performed: AMPUTATION RAY (Right)     Patient location during evaluation: PACU Anesthesia Type: Regional Level of consciousness: awake and alert, awake and oriented Pain management: pain level controlled Vital Signs Assessment: post-procedure vital signs reviewed and stable Respiratory status: spontaneous breathing, nonlabored ventilation, respiratory function stable and patient connected to nasal cannula oxygen Cardiovascular status: stable and blood pressure returned to baseline Postop Assessment: no apparent nausea or vomiting Anesthetic complications: no   No notable events documented.  Last Vitals:  Vitals:   01/30/21 1254 01/30/21 1355  BP: (!) 99/48   Pulse: 88   Resp: 10   Temp:  (!) 36.3 C  SpO2: 100%     Last Pain:  Vitals:   01/30/21 1410  TempSrc:   PainSc: 0-No pain                 Cecile Hearing

## 2021-01-30 NOTE — Anesthesia Procedure Notes (Signed)
Anesthesia Regional Block: Popliteal block   Pre-Anesthetic Checklist: , timeout performed,  Correct Patient, Correct Site, Correct Laterality,  Correct Procedure, Correct Position, site marked,  Risks and benefits discussed,  Surgical consent,  Pre-op evaluation,  At surgeon's request and post-op pain management  Laterality: Right  Prep: chloraprep       Needles:  Injection technique: Single-shot  Needle Type: Echogenic Needle     Needle Length: 9cm  Needle Gauge: 21     Additional Needles:   Procedures:,,,, ultrasound used (permanent image in chart),,    Narrative:  Start time: 01/30/2021 12:38 PM End time: 01/30/2021 12:44 PM Injection made incrementally with aspirations every 5 mL.  Performed by: Personally  Anesthesiologist: Cecile Hearing, MD  Additional Notes: No pain on injection. No increased resistance to injection. Injection made in 5cc increments.  Good needle visualization.  Patient tolerated procedure well.

## 2021-01-30 NOTE — Transfer of Care (Signed)
Immediate Anesthesia Transfer of Care Note  Patient: [name]  Procedure(s) Performed: AMPUTATION RAY (Right)  Patient Location: PACU  Anesthesia Type:MAC  Level of Consciousness: awake, alert  and patient cooperative  Airway & Oxygen Therapy: Patient Spontanous Breathing and Patient connected to nasal cannula oxygen  Post-op Assessment: Report given to RN and Post -op Vital signs reviewed and stable  Post vital signs: Reviewed and stable  Last Vitals:  Vitals Value Taken Time  BP 88/55 01/30/21 1352  Temp    Pulse 80 01/30/21 1354  Resp 9 01/30/21 1354  SpO2 100 % 01/30/21 1354  Vitals shown include unvalidated device data.  Last Pain:  Vitals:   01/30/21 1222  TempSrc:   PainSc: 8       Patients Stated Pain Goal: 0 (34/96/11 6435)  Complications: No notable events documented.

## 2021-01-30 NOTE — Progress Notes (Signed)
Patient seen in pre-op. Scheduled for right 4th and 5th partial ray amputation. Again discussed the surgery and post-op course. Again discussed risks and complications of surgery and he has no further questions/concerns. Consent signed.

## 2021-01-30 NOTE — Progress Notes (Signed)
PROGRESS NOTE    [name]  EPP:[id number] DOB: 1965-07-21 DOA: 01/22/2021 PCP: Joice Lofts, NP   Chief Complaint  Patient presents with   Wound Infection    Brief Narrative:  55 year old male with diabetes, hypertension and tobacco abuse, HLD, CAD status post CABG times 47/14/22 over the saphenous vein was harvested on his right leg and has developed infection of the wound on the side since then, followed by outpatient podiatry, wound was improving.  In the last week it has gotten worse with discoloration of the right fourth and fifth toes more ordered and worsening symptoms, last 24 hours prior to admission got worse.  Came to the ED work-up showed osteomyelitis and MRI podiatry was consulted and was admitted  Subjective:  -No chest pain, no shortness of breath today.  Assessment & Plan:  Osteomyelitis and nonhealing wound on of toe of right foot in the setting of diabetes, PAD:  -Podiatry following, plan for amputation after revascularization . -Vascular surgery consulted, plan for angiogram today to determine further plans for revascularization . -New with broad-spectrum antibiotic vancomycin and Zosyn for now . -Continue with aspirin.   -Vascular input greatly appreciated, s/p Aortogram, RLE runoff with DCB angioplasty of Right Popliteal artery and balloon angioplasty of right posterior tibial artery 8/16 by Dr. Myra Gianotti . -Plan for amputation of fourth/fifth toes by podiatry.  PVD -See above discussion.  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  Continue current long-acting insulin SSI Neurontin and monitor CBG as below, BG remains elevated, but I will hold on increasing Semglee given he will be n.p.o. after midnight for procedure tomorrow.   Recent Labs  Lab 01/29/21 1143 01/29/21 1607 01/29/21 2144 01/30/21 0739 01/30/21 1117  GLUCAP 164* 185* 200* 170* 124*     Hypertension controlled on metoprolol  Anxiety/depression mood is stable on BuSpar, Cymbalta and Topamax  Hyperlipidemia on Crestor  Orthostatic hypotension continue home midodrine monitor vitals  Anemia suspect in the setting of chronic disease.  Monitor hemoglobin  Severe protein calorie moderation augment diet as below  Diet Order             Diet NPO time specified  Diet effective midnight                   Nutrition Problem: Severe Malnutrition Etiology: chronic illness (CAD s/p CABG) Signs/Symptoms: severe fat depletion, severe muscle depletion, energy intake < or equal to 75% for > or equal to 1 month, percent weight loss Interventions: Refer to RD note for recommendations Patient's Body mass index is 18.86 kg/m.  DVT prophylaxis: SCD's Start: 01/29/21 2025 SCD's Start: 01/29/21 1409 enoxaparin (LOVENOX) injection 40 mg Start: 01/23/21 0600 Code Status:   Code Status: Full Code  Family Communication: plan of care discussed with patient at bedside. Status is: Inpatient  Remains inpatient appropriate because:IV treatments appropriate due to intensity of illness or inability to take PO and Inpatient level of care appropriate due to severity of illness  Dispo: The patient is from: Home              Anticipated d/c is to: Home              Patient currently is not medically stable to d/c.   Difficult to place patient No         Medications reviewed:  Scheduled Meds:  [MAR Hold] (feeding supplement) PROSource Plus  30 mL  Oral BID BM   [MAR Hold] aspirin  81 mg Oral Daily   [MAR Hold] busPIRone  10 mg Oral BID   [MAR Hold] clopidogrel  75 mg Oral Q breakfast   [MAR Hold] diphenhydrAMINE  25 mg Oral Daily   [MAR Hold] DULoxetine  60 mg Oral q AM   [MAR Hold] enoxaparin (LOVENOX) injection  40 mg Subcutaneous Q24H   [MAR Hold] feeding supplement  1 Container Oral BID BM   [MAR Hold] ferrous sulfate  325 mg Oral Q breakfast   [MAR Hold] gabapentin  300 mg  Oral TID   [MAR Hold] insulin aspart  0-5 Units Subcutaneous QHS   [MAR Hold] insulin aspart  0-9 Units Subcutaneous TID WC   [MAR Hold] insulin glargine-yfgn  16 Units Subcutaneous Daily   [MAR Hold] metoprolol tartrate  12.5 mg Oral BID   [MAR Hold] midodrine  10 mg Oral TID WC   [MAR Hold] multivitamin with minerals  1 tablet Oral Daily   [MAR Hold] pantoprazole  40 mg Oral Daily   [MAR Hold] rosuvastatin  20 mg Oral Daily   [MAR Hold] sodium chloride flush  3 mL Intravenous Q12H   [MAR Hold] topiramate  50 mg Oral BID   [MAR Hold] Vitamin D (Ergocalciferol)  50,000 Units Oral Q Fri   Continuous Infusions:  [MAR Hold] sodium chloride Stopped (01/24/21 1436)   sodium chloride 100 mL/hr at 01/29/21 1106   [MAR Hold] sodium chloride     lactated ringers 10 mL/hr at 01/30/21 1300   [MAR Hold] piperacillin-tazobactam (ZOSYN)  IV 3.375 g (01/30/21 0543)   [MAR Hold] vancomycin     Consultants -Podiatry -Vascular  Procedures   Antimicrobials: Anti-infectives (From admission, onward)    Start     Dose/Rate Route Frequency Ordered Stop   01/30/21 2200  [MAR Hold]  vancomycin (VANCOREADY) IVPB 1500 mg/300 mL        (MAR Hold since Wed 01/30/2021 at 1201.Hold Reason: Transfer to a Procedural area)   1,500 mg 150 mL/hr over 120 Minutes Intravenous Every 24 hours 01/30/21 1025     01/23/21 2045  vancomycin (VANCOREADY) IVPB 1250 mg/250 mL  Status:  Discontinued        1,250 mg 166.7 mL/hr over 90 Minutes Intravenous Every 24 hours 01/22/21 2224 01/30/21 1025   01/23/21 0600  [MAR Hold]  piperacillin-tazobactam (ZOSYN) IVPB 3.375 g        (MAR Hold since Wed 01/30/2021 at 1201.Hold Reason: Transfer to a Procedural area)  See Hyperspace for full Linked Orders Report.   3.375 g 12.5 mL/hr over 240 Minutes Intravenous Every 8 hours 01/22/21 2034     01/22/21 2045  piperacillin-tazobactam (ZOSYN) IVPB 3.375 g       See Hyperspace for full Linked Orders Report.   3.375 g 100 mL/hr over 30  Minutes Intravenous  Once 01/22/21 2034 01/22/21 2118   01/22/21 2045  vancomycin (VANCOREADY) IVPB 1250 mg/250 mL  Status:  Discontinued        1,250 mg 166.7 mL/hr over 90 Minutes Intravenous  Once 01/22/21 2034 01/22/21 2224      Culture/Microbiology    Component Value Date/Time   SDES BLOOD SITE NOT SPECIFIED 01/22/2021 1701   SPECREQUEST  01/22/2021 1701    BOTTLES DRAWN AEROBIC AND ANAEROBIC Blood Culture results may not be optimal due to an inadequate volume of blood received in culture bottles   CULT  01/22/2021 1701    NO GROWTH 5 DAYS Performed at [facility]  [facility], [address] [place], [place] [address]    REPTSTATUS 01/27/2021 FINAL 01/22/2021 1701    Other culture-see note  Objective: Vitals: Today's Vitals   01/30/21 1244 01/30/21 1249 01/30/21 1251 01/30/21 1254  BP: (!) 94/52 (!) 89/57 (!) 93/56 (!) 99/48  Pulse: 87 87 88 88  Resp: Temp:      TempSrc:      SpO2: 100% 100% 100% 100%  Weight:      Height:      PainSc:        Intake/Output Summary (Last 24 hours) at 01/30/2021 1352 Last data filed at 01/30/2021 1344 Gross per 24 hour  Intake 1725.31 ml  Output 650 ml  Net 1075.31 ml   Filed Weights   01/22/21 1646 01/26/21 0552  Weight: 55.3 kg 54.6 kg   Weight change:   Intake/Output from previous day: 08/16 0701 - 08/17 0700 In: 1375.3 [P.O.:240; I.V.:140.7; IV Piggyback:994.6] Out: 650 [Urine:650] Intake/Output this shift: Total I/O In: 350 [I.V.:300; IV Piggyback:50] Out: -  Filed Weights   01/22/21 1646 01/26/21 0552  Weight: 55.3 kg 54.6 kg   Examination:   Patient was seen and examined before his surgery  Awake Alert, Oriented X 3, No new F.N deficits, Normal affect Symmetrical Chest wall movement, Good air movement bilaterally, CTAB RRR,No Gallops,Rubs or new Murmurs, No Parasternal Heave +ve B.Sounds, Abd Soft, No tenderness, No rebound - guarding or rigidity. Right Fourth/fifth toe gangrene  Data Reviewed: I  have personally reviewed following labs and imaging studies CBC: Recent Labs  Lab 01/24/21 0257 01/26/21 0349 01/27/21 0234 01/29/21 0635 01/30/21 0145  WBC 9.5 12.5* 9.5 7.9 9.3  HGB 9.7* 9.9* 9.8* 10.0* 10.1*  HCT 29.3* 30.8* 30.5* 31.7* 31.5*  MCV 84.9 86.3 85.2 84.8 85.1  PLT 362 366 356 374 355   Basic Metabolic Panel: Recent Labs  Lab 01/25/21 2015 01/26/21 0349 01/27/21 0234 01/29/21 0635 01/30/21 0145  NA 133* 137 136 137 137  K 4.2 4.1 4.3 3.9 3.9  CL 102 104 104 103 105  CO2 GLUCOSE 209* 206* 305* 213* 230*  BUN 23* 19 22* 21* 22*  CREATININE 0.80 0.87 0.90 0.75 0.74  CALCIUM 9.2 9.5 9.3 9.7 9.4   GFR: Estimated Creatinine Clearance: 80.6 mL/min (by C-G formula based on SCr of 0.74 mg/dL). Liver Function Tests: Recent Labs  Lab 01/25/21 2015  AST 30  ALT 25  ALKPHOS 328*  BILITOT 0.5  PROT 7.0  ALBUMIN 2.7*   No results for input(s): LIPASE, AMYLASE in the last 168 hours. No results for input(s): AMMONIA in the last 168 hours. Coagulation Profile: No results for input(s): INR, PROTIME in the last 168 hours. Cardiac Enzymes: No results for input(s): CKTOTAL, CKMB, CKMBINDEX, TROPONINI in the last 168 hours. BNP (last 3 results) No results for input(s): PROBNP in the last 8760 hours. HbA1C: No results for input(s): HGBA1C in the last 72 hours. CBG: Recent Labs  Lab 01/29/21 1143 01/29/21 1607 01/29/21 2144 01/30/21 0739 01/30/21 1117  GLUCAP 164* 185* 200* 170* 124*   Lipid Profile: No results for input(s): CHOL, HDL, LDLCALC, TRIG, CHOLHDL, LDLDIRECT in the last 72 hours. Thyroid Function Tests: No results for input(s): TSH, T4TOTAL, FREET4, T3FREE, THYROIDAB in the last 72 hours. Anemia Panel: No results for input(s): VITAMINB12, FOLATE, FERRITIN, TIBC, IRON, RETICCTPCT in the last 72 hours. Sepsis Labs: No results for input(s): PROCALCITON, LATICACIDVEN in the last 168  hours.   Recent Results (from the past 240  hour(s))  Blood culture (routine single)     Status: None   Collection Time: 01/22/21  5:01 PM   Specimen: BLOOD  Result Value Ref Range Status   Specimen Description BLOOD SITE NOT SPECIFIED  Final   Special Requests   Final    BOTTLES DRAWN AEROBIC AND ANAEROBIC Blood Culture results may not be optimal due to an inadequate volume of blood received in culture bottles   Culture   Final    NO GROWTH 5 DAYS Performed at [facility], [address] [place], [place] [address]    Report Status 01/27/2021 FINAL  Final  Resp Panel by RT-PCR (Flu A&B, Covid) Nasopharyngeal Swab     Status: None   Collection Time: 01/22/21  8:28 PM   Specimen: Nasopharyngeal Swab; Nasopharyngeal(NP) swabs in vial transport medium  Result Value Ref Range Status   SARS Coronavirus 2 by RT PCR NEGATIVE NEGATIVE Final    Comment: (NOTE) SARS-CoV-2 target nucleic acids are NOT DETECTED.  The SARS-CoV-2 RNA is generally detectable in upper respiratory specimens during the acute phase of infection. The lowest concentration of SARS-CoV-2 viral copies this assay can detect is 138 copies/mL. A negative result does not preclude SARS-Cov-2 infection and should not be used as the sole basis for treatment or other patient management decisions. A negative result may occur with  improper specimen collection/handling, submission of specimen other than nasopharyngeal swab, presence of viral mutation(s) within the areas targeted by this assay, and inadequate number of viral copies(<138 copies/mL). A negative result must be combined with clinical observations, patient history, and epidemiological information. The expected result is Negative.  Fact Sheet for Patients:  BloggerCourse.com  Fact Sheet for Healthcare Providers:  SeriousBroker.it  This test is no t yet approved or cleared by the [place] FDA and  has been authorized for detection and/or diagnosis of  SARS-CoV-2 by FDA under an Emergency Use Authorization (EUA). This EUA will remain  in effect (meaning this test can be used) for the duration of the COVID-19 declaration under Section 564(b)(1) of the Act, 21 U.S.C.section 360bbb-3(b)(1), unless the authorization is terminated  or revoked sooner.       Influenza A by PCR NEGATIVE NEGATIVE Final   Influenza B by PCR NEGATIVE NEGATIVE Final    Comment: (NOTE) The Xpert Xpress SARS-CoV-2/FLU/RSV plus assay is intended as an aid in the diagnosis of influenza from Nasopharyngeal swab specimens and should not be used as a sole basis for treatment. Nasal washings and aspirates are unacceptable for Xpert Xpress SARS-CoV-2/FLU/RSV testing.  Fact Sheet for Patients: BloggerCourse.com  Fact Sheet for Healthcare Providers: SeriousBroker.it  This test is not yet approved or cleared by the [place] FDA and has been authorized for detection and/or diagnosis of SARS-CoV-2 by FDA under an Emergency Use Authorization (EUA). This EUA will remain in effect (meaning this test can be used) for the duration of the COVID-19 declaration under Section 564(b)(1) of the Act, 21 U.S.C. section 360bbb-3(b)(1), unless the authorization is terminated or revoked.  Performed at [facility], [address] [place], [place] [address]   Surgical pcr screen     Status: None   Collection Time: 01/30/21  8:57 AM   Specimen: Nasal Mucosa; Nasal Swab  Result Value Ref Range Status   MRSA, PCR NEGATIVE NEGATIVE Final   Staphylococcus aureus NEGATIVE NEGATIVE Final    Comment: (NOTE) The Xpert SA Assay (FDA approved  for NASAL specimens in patients 64 years of age and older), is one component of a comprehensive surveillance program. It is not intended to diagnose infection nor to guide or monitor treatment. Performed at [facility], [address] [place], [place] [address]      Radiology  Studies: PERIPHERAL VASCULAR CATHETERIZATION  Result Date: 01/29/2021 Images from the original result were not included. Patient name: Audric Venn MRN: [id number] DOB: 1966/01/23 Sex: male 01/29/2021 Pre-operative Diagnosis: Right toe ulcer Post-operative diagnosis:  Same Surgeon:  [name] Procedure Performed:  1.  Ultrasound-guided access, left femoral artery  2.  Abdominal aortogram  3.  Right lower extremity runoff  4.  Drug-coated balloon angioplasty, right popliteal artery  5.  Balloon angioplasty, right posterior tibial artery  6.  Intra-arterial administration of nitroglycerin  7.  Conscious sedation, 66 minutes  8.  Closure device, Celt Indications: This is a 55 year old gentleman with necrotic right fourth and fifth toes and abnormal Doppler studies he comes in today for further evaluation and possible invention Procedure:  The patient was identified in the holding area and taken to room 8.  The patient was then placed supine on the table and prepped and draped in the usual sterile fashion.  A time out was called.  Conscious sedation was administered with the use of IV fentanyl and Versed under continuous physician and nurse monitoring.  Heart rate, blood pressure, and oxygen saturation were continuously monitored.  Total sedation time was 66 minutes.  Ultrasound was used to evaluate the left common femoral artery.  It was patent .  A digital ultrasound image was acquired.  A micropuncture needle was used to access the left common femoral artery under ultrasound guidance.  An 018 wire was advanced without resistance and a micropuncture sheath was placed.  The 018 wire was removed and a benson wire was placed.  The micropuncture sheath was exchanged for a 5 french sheath.  An omniflush catheter was advanced over the wire to the level of L-1.  An abdominal angiogram was obtained.  Next, using the omniflush catheter and a benson wire, the aortic bifurcation was crossed and the catheter was placed  into theright external iliac artery and right runoff was obtained.  Findings:  Aortogram: No significant renal artery stenosis.  The infrarenal abdominal aorta is widely patent without significant stenosis.  Bilateral common and external iliac arteries are widely patent.  Right Lower Extremity: The right common femoral and profundofemoral artery are widely patent without stenosis.  The superficial femoral artery is widely patent.  There is a focal stenosis approximately 25 mm in length within the above-knee popliteal artery.  The below-knee popliteal artery is widely patent.  The dominant runoff is the anterior tibial.  There were several stenoses throughout the posterior tibial artery, greater than 70%  Left Lower Extremity: Not evaluated due to contrast utilization Intervention: After the above images were acquired the decision made to proceed with intervention.  Over an 035 wire, a 6 French 45 cm sheath was inserted.  The patient was fully heparinized.  I then used a 018 wire and a 3 x 100 Sterling balloon into the posterior tibial artery.  The wire was taken down below the ankle.  I treated the posterior tibial artery with primary balloon angioplasty using a 3 x 100 balloon.  Multiple overlapping inflations for 2 minutes were performed.  Follow-up imaging was performed.  The vessel appeared to be in spasm.  I removed the wire from the balloon  and injected 300 mcg of nitroglycerin into the posterior tibial artery. Attention was then turned towards the popliteal lesion.  I selected a 4 x 40 drug-coated Ranger balloon and treated this area at nominal pressure for 3 minutes.  Completion imaging revealed resolution of the stenosis.  Runoff was then performed.  The patient now has much improved flow through the posterior tibial artery as well as the tear tibial artery which remains changed.  After results were obtained, I closed the groin with a Celt Impression:  #1  Greater than 80% right popliteal artery stenosis  treated using a 4 x 40 drug-coated Ranger balloon  #2  Severe stenosis throughout the posterior tibial artery treated using a 3 mm balloon  #3  After intervention, the patient has an intact plantar arch.  The anterior tibial and posterior tibial arteries.  He should have adequate blood flow for amputation.  Juleen ChinaV. Wells Brabham, M.D., FACS [facility] Office: [phone] Pager:  [phone]     LOS: 8 days   Huey Bienenstockawood Akaylah Lalley, MD [facility]  01/30/2021, 1:52 PM

## 2021-01-30 NOTE — Anesthesia Preprocedure Evaluation (Signed)
Anesthesia Evaluation  Patient identified by MRN, date of birth, ID band Patient awake    Reviewed: Allergy & Precautions, NPO status , Patient's Chart, lab work & pertinent test results  Airway Mallampati: II  TM Distance: >3 FB Neck ROM: Full    Dental  (+) Dental Advisory Given, Edentulous Upper, Missing,    Pulmonary Patient abstained from smoking., former smoker,    Pulmonary exam normal breath sounds clear to auscultation       Cardiovascular hypertension, (-) angina+ CAD and + CABG  Normal cardiovascular exam Rhythm:Regular Rate:Normal     Neuro/Psych negative neurological ROS  negative psych ROS   GI/Hepatic negative GI ROS, Neg liver ROS,   Endo/Other  diabetes, Type 2, Oral Hypoglycemic Agents  Renal/GU negative Renal ROS     Musculoskeletal negative musculoskeletal ROS (+)   Abdominal   Peds  Hematology  (+) Blood dyscrasia, anemia ,   Anesthesia Other Findings Day of surgery medications reviewed with the patient.  Reproductive/Obstetrics                             Anesthesia Physical Anesthesia Plan  ASA: 4  Anesthesia Plan: Regional   Post-op Pain Management:    Induction: Intravenous  PONV Risk Score and Plan: 1 and Propofol infusion and Treatment may vary due to age or medical condition  Airway Management Planned: Natural Airway and Nasal Cannula  Additional Equipment:   Intra-op Plan:   Post-operative Plan:   Informed Consent: I have reviewed the patients History and Physical, chart, labs and discussed the procedure including the risks, benefits and alternatives for the proposed anesthesia with the patient or authorized representative who has indicated his/her understanding and acceptance.     Dental advisory given  Plan Discussed with: CRNA  Anesthesia Plan Comments:         Anesthesia Quick Evaluation

## 2021-01-30 NOTE — Op Note (Signed)
  PATIENT:  [name]  55 y.o. male  PRE-OPERATIVE DIAGNOSIS:  Gangrene  POST-OPERATIVE DIAGNOSIS:  Gangrene  PROCEDURE:  Procedure(s): AMPUTATION RAY (Right)  SURGEON:  Surgeon(s) and Role:    * Vivi Barrack, DPM - Primary  PHYSICIAN ASSISTANT:   ASSISTANTS: none   ANESTHESIA:   general  EBL:  5 cc   BLOOD ADMINISTERED:none  DRAINS: none   LOCAL MEDICATIONS USED:  NONE  SPECIMEN:  Source of Specimen:  wound culture right foot  DISPOSITION OF SPECIMEN:  PATHOLOGY  COUNTS:  YES  TOURNIQUET:  * Missing tourniquet times found for documented tourniquets in log: [id number] *  DICTATION: .Dragon Dictation  PLAN OF CARE: Admit to inpatient   PATIENT DISPOSITION:  PACU - hemodynamically stable.   Delay start of Pharmacological VTE agent (>24hrs) due to surgical blood loss or risk of bleeding: no  Indications for surgery: 55 year old male has been treated for a wound on the outpatient setting however developed worsening of the wound as well as gangrene he was admitted to the hospital.  Underwent intervention with vascular surgery did not have adequate circulation.  Given the gangrene recommended partial fourth and fifth ray amputations.  Discussed the procedure as well as postoperative course with the patient.  Alternatives, risks, complications discussed.  No promises or guarantees given aspect of the procedure all questions were answered the best my ability.  Procedure in detail: The patient was both verbally, visually identified by myself and nursing staff and the anesthesia staff preoperatively.  Preoperatively he had a nerve block performed by anesthesia.  He was then transferred to the operating room via stretcher and placed on the operating table in supine position.  Right lower extremities and scrubbed, prepped, draped in normal sterile fashion.  Timeout was performed.  A modified racquet shaped incision was made along the fourth and fifth digits at the level of  the MPJ.  A #10 blade scalpel was utilized to make the incision from skin to bone to the level of the MPJ circumferentially around the both the fourth and fifth MPJs and the toes were then disarticulated at this level.  There is found to be some purulence noted just medial to the fourth MPJ and lateral to the third MPJ.  Soft tissue was then released and the fourth and fifth metatarsals and a sagittal saw was utilized to resect the metatarsals.  At this time I debrided all nonviable devitalized tissue with a rongeur was in a 15 with scalpel.  No further purulence was identified remaining tissue appeared to be healthy.  I resected approximately 2 others bleeding, healthy appearing tissue.  The wound was copiously irrigated with saline and hemostasis was achieved.  I was able to bring a plantar flap up dorsally for wound closure.  The wound was closed without tension with 2-0 nylon as well as 3-0 nylon in a simple in procedure fashion.  Patient was awoken from anesthesia and found to tolerate the procedure well.  Transferred to PACU with vital signs stable and vascular status intact remaining digits.  Postoperative course: Recommend continue with IV naproxen next 48 hours and plan on changing the dressing on Friday before discharge Friday or Saturday as long as the incision is healing appropriately.  He will be weightbearing to heel in the Darco wedge shoe.  Need physical therapy but will wait until tomorrow for PT.

## 2021-01-31 ENCOUNTER — Encounter (HOSPITAL_COMMUNITY): Payer: Self-pay | Admitting: Podiatry

## 2021-01-31 DIAGNOSIS — Z9862 Peripheral vascular angioplasty status: Secondary | ICD-10-CM

## 2021-01-31 LAB — BASIC METABOLIC PANEL
Anion gap: 11 (ref 5–15)
BUN: 17 mg/dL (ref 6–20)
CO2: 26 mmol/L (ref 22–32)
Calcium: 9.9 mg/dL (ref 8.9–10.3)
Chloride: 104 mmol/L (ref 98–111)
Creatinine, Ser: 0.72 mg/dL (ref 0.61–1.24)
GFR, Estimated: >60 mL/min (ref >60)
Glucose, Bld: 198 mg/dL — ABNORMAL HIGH (ref 70–99)
Potassium: 4.1 mmol/L (ref 3.5–5.1)
Sodium: 141 mmol/L (ref 135–145)

## 2021-01-31 LAB — CBC
HCT: 30.1 % — ABNORMAL LOW (ref 39.0–52.0)
Hemoglobin: 9.5 g/dL — ABNORMAL LOW (ref 13.0–17.0)
MCH: 27 pg (ref 26.0–34.0)
MCHC: 31.6 g/dL (ref 30.0–36.0)
MCV: 85.5 fL (ref 80.0–100.0)
Platelets: 352 10*3/uL (ref 150–400)
RBC: 3.52 MIL/uL — ABNORMAL LOW (ref 4.22–5.81)
RDW: 13.7 % (ref 11.5–15.5)
WBC: 9 10*3/uL (ref 4.0–10.5)
nRBC: 0 % (ref 0.0–0.2)

## 2021-01-31 LAB — GLUCOSE, CAPILLARY
Glucose-Capillary: 232 mg/dL — ABNORMAL HIGH (ref 70–99)
Glucose-Capillary: 146 mg/dL — ABNORMAL HIGH (ref 70–99)
Glucose-Capillary: 183 mg/dL — ABNORMAL HIGH (ref 70–99)
Glucose-Capillary: 175 mg/dL — ABNORMAL HIGH (ref 70–99)
Glucose-Capillary: 197 mg/dL — ABNORMAL HIGH (ref 70–99)

## 2021-01-31 NOTE — Evaluation (Signed)
Physical Therapy Evaluation Patient Details Name: [name] MRN: [id number] DOB: 06/25/1965 Today's Date: 01/31/2021   History of Present Illness  Pt is a 55 y.o. male admitted 01/22/21 with worsening R toes necrosis. Workup for osteomyelitis and nonhealing wound of R toes. S/p RLE revascularization 8/16. S/p R foot ray amputation 8/17. PMH includes CAD (s/p CABG 12/2020), DM, HTN, PVD, anemia, ataxia.   Clinical Impression  Pt presents with an overall decrease in functional mobility secondary to above. PTA, pt mod indep ambulating with RW/SPC, lives with wife who assists with ADL/iADLs as needed. Educ on precautions, darco shoe wear, positioning, and importance of mobility. Today, pt able to initiate transfer and gait training with RW, requiring min guard to minA. Pt would benefit from continued acute PT services to maximize functional mobility and independence prior to d/c with HHPT services.     Follow Up Recommendations Home health PT;Supervision for mobility/OOB    Equipment Recommendations  None recommended by PT    Recommendations for Other Services       Precautions / Restrictions Precautions Precautions: Fall;Sternal Precaution Comments: Recent CABG 12/27/20 - be mindful of sternal precautions Required Braces or Orthoses: Other Brace Other Brace: R darco shoe Restrictions Weight Bearing Restrictions: Yes RUE Weight Bearing: Partial weight bearing RUE Partial Weight Bearing Percentage or Pounds: WB through heel in darco shoe      Mobility  Bed Mobility Overal bed mobility: Independent                  Transfers Overall transfer level: Needs assistance Equipment used: Rolling walker (2 wheeled) Transfers: Sit to/from Stand Sit to Stand: Min guard         General transfer comment: Cues for hand placement as pt attempting to pull on RW; min guard for balance  Ambulation/Gait Ambulation/Gait assistance: Min guard;Min assist Gait Distance (Feet): 12  Feet Assistive device: Rolling walker (2 wheeled) Gait Pattern/deviations: Step-through pattern;Decreased stride length;Antalgic Gait velocity: Decreased   General Gait Details: Slow, mildly unsteady gait with RW and min guard, 1x minA to maintain balance; darco shoe donned on R foot, pt with some instability related to this; pt reports, "I bet if I had my other shoe on this would be easier"; seated rest secondary to c/o BLE fatigue and lightheadedness  Stairs            Wheelchair Mobility    Modified Rankin (Stroke Patients Only)       Balance Overall balance assessment: Needs assistance Sitting-balance support: No upper extremity supported;Feet supported Sitting balance-Leahy Scale: Good Sitting balance - Comments: assist to don darco shoe   Standing balance support: Bilateral upper extremity supported Standing balance-Leahy Scale: Poor Standing balance comment: Reliant on UE support                             Pertinent Vitals/Pain Pain Assessment: No/denies pain Pain Location:  (R foot still numb) Pain Intervention(s): Monitored during session    Home Living Family/patient expects to be discharged to:: Private residence Living Arrangements: Spouse/significant other Available Help at Discharge: Family;Available 24 hours/day Type of Home: House Home Access: Level entry     Home Layout: Two level;Bed/bath upstairs Home Equipment: Walker - 2 wheels;Tub bench;Bedside commode;Cane - single point      Prior Function Level of Independence: Needs assistance   Gait / Transfers Assistance Needed: Ambulatory with SPC or RW; has not been mobilizing as much since recent CABG (12/27/20);  does not work  ADL's / Air traffic controller Needed: Reports wife "helps me with what I need" - pt uses tub bench for bathing with assist from wife; wife performs household tasks        Hand Dominance        Extremity/Trunk Assessment   Upper Extremity  Assessment Upper Extremity Assessment: Generalized weakness    Lower Extremity Assessment Lower Extremity Assessment: Generalized weakness;RLE deficits/detail RLE Deficits / Details: s/p R ray amputation (4-5th toes), pt reports foot/lower leg still numb; hip and knee strength functionally at least 3/5       Communication   Communication: Other (comment) (slowed speech baseline)  Cognition Arousal/Alertness: Awake/alert Behavior During Therapy: Flat affect Overall Cognitive Status: No family/caregiver present to determine baseline cognitive functioning                                 General Comments: Pt answering questions and following simple commands appropriately; at times with increased time; suspect near baseline cognition. Pleasant and motivated      General Comments General comments (skin integrity, edema, etc.): Post-mobility BP 92/56, up to 100/54 seated in recliner (RN aware). Pt reports, "The doctor said I might need to go to a facility, but I don't want to do that"    Exercises     Assessment/Plan    PT Assessment Patient needs continued PT services  PT Problem List Decreased strength;Decreased activity tolerance;Decreased balance;Decreased mobility;Decreased knowledge of use of DME;Decreased knowledge of precautions       PT Treatment Interventions DME instruction;Gait training;Stair training;Functional mobility training;Therapeutic activities;Therapeutic exercise;Balance training;Patient/family education    PT Goals (Current goals can be found in the Care Plan section)  Acute Rehab PT Goals Patient Stated Goal: Return home with help from wife; "I don't want to go to a facility... I've been away from home for too long" PT Goal Formulation: With patient Time For Goal Achievement: 02/14/21 Potential to Achieve Goals: Good    Frequency Min 3X/week   Barriers to discharge        Co-evaluation               AM-PAC PT "6 Clicks" Mobility   Outcome Measure Help needed turning from your back to your side while in a flat bed without using bedrails?: None Help needed moving from lying on your back to sitting on the side of a flat bed without using bedrails?: None Help needed moving to and from a bed to a chair (including a wheelchair)?: A Little Help needed standing up from a chair using your arms (e.g., wheelchair or bedside chair)?: A Little Help needed to walk in hospital room?: A Little Help needed climbing 3-5 steps with a railing? : A Little 6 Click Score: 20    End of Session Equipment Utilized During Treatment: Gait belt Activity Tolerance: Patient tolerated treatment well Patient left: in chair;with call bell/phone within reach;with chair alarm set Nurse Communication: Mobility status PT Visit Diagnosis: Other abnormalities of gait and mobility (R26.89);Muscle weakness (generalized) (M62.81)    Time: 4098-1191 PT Time Calculation (min) (ACUTE ONLY): 21 min   Charges:   PT Evaluation $PT Eval Moderate Complexity: 1 Mod        Ina Homes, PT, DPT Acute Rehabilitation Services  Pager [phone] Office [phone]  Malachy Chamber 01/31/2021, 5:29 PM

## 2021-01-31 NOTE — Progress Notes (Signed)
Subjective  - POD #2, status post aortogram with popliteal and tibial intervention  He has no complaints today.  He underwent successful amputation of his right fourth and fifth toe yesterday.    Physical Exam:  Palpable right posterior tibial pulse. Left femoral cannulation site without hematoma. Abdomen soft and nontender       Assessment/Plan:  POD #2  The patient is stable from a vascular perspective.  His foot wound and amputation are being managed by Dr. Earleen Newport with podiatry.  He has been optimized from a vascular standpoint.  I will sign off.  He will follow-up in the office in 1 month with vascular lab studies.  Wells Jaquan Sadowsky 01/31/2021 1:13 PM --  Vitals:   01/31/21 0743 01/31/21 1121  BP: 93/63 102/60  Pulse: 95 93  Resp: 17 18  Temp: 98.7 F (37.1 C) 98.9 F (37.2 C)  SpO2:      Intake/Output Summary (Last 24 hours) at 01/31/2021 1313 Last data filed at 01/31/2021 0856 Gross per 24 hour  Intake 1158.59 ml  Output 510 ml  Net 648.59 ml     Laboratory CBC    Component Value Date/Time   WBC 9.0 01/31/2021 0402   HGB 9.5 (L) 01/31/2021 0402   HGB 9.4 (L) 01/15/2021 0000   HCT 30.1 (L) 01/31/2021 0402   HCT 28.9 (L) 01/15/2021 0000   PLT 352 01/31/2021 0402   PLT 569 (H) 01/15/2021 0000    BMET    Component Value Date/Time   NA 141 01/31/2021 0402   NA 136 01/15/2021 0000   K 4.1 01/31/2021 0402   CL 104 01/31/2021 0402   CO2 26 01/31/2021 0402   GLUCOSE 198 (H) 01/31/2021 0402   BUN 17 01/31/2021 0402   BUN 26 (H) 01/15/2021 0000   CREATININE 0.72 01/31/2021 0402   CALCIUM 9.9 01/31/2021 0402   GFRNONAA >60 01/31/2021 0402   GFRAA  09/08/2008 1152    >60        The eGFR has been calculated using the MDRD equation. This calculation has not been validated in all clinical situations. eGFR's persistently <60 mL/min signify possible Chronic Kidney Disease.    COAG Lab Results  Component Value Date   INR 1.6 (H) 12/27/2020    INR 1.0 12/27/2020   INR 1.0 09/08/2008   No results found for: PTT  Antibiotics Anti-infectives (From admission, onward)    Start     Dose/Rate Route Frequency Ordered Stop   01/30/21 2200  vancomycin (VANCOREADY) IVPB 1500 mg/300 mL        1,500 mg 150 mL/hr over 120 Minutes Intravenous Every 24 hours 01/30/21 1025     01/23/21 2045  vancomycin (VANCOREADY) IVPB 1250 mg/250 mL  Status:  Discontinued        1,250 mg 166.7 mL/hr over 90 Minutes Intravenous Every 24 hours 01/22/21 2224 01/30/21 1025   01/23/21 0600  piperacillin-tazobactam (ZOSYN) IVPB 3.375 g       See Hyperspace for full Linked Orders Report.   3.375 g 12.5 mL/hr over 240 Minutes Intravenous Every 8 hours 01/22/21 2034     01/22/21 2045  piperacillin-tazobactam (ZOSYN) IVPB 3.375 g       See Hyperspace for full Linked Orders Report.   3.375 g 100 mL/hr over 30 Minutes Intravenous  Once 01/22/21 2034 01/22/21 2118   01/22/21 2045  vancomycin (VANCOREADY) IVPB 1250 mg/250 mL  Status:  Discontinued        1,250 mg 166.7 mL/hr  over 90 Minutes Intravenous  Once 01/22/21 2034 01/22/21 2224        V. Leia Alf, M.D., [facility] [place] Office: [phone] Pager:  [phone]

## 2021-01-31 NOTE — Progress Notes (Signed)
PROGRESS NOTE    [name]  AVW:[id number] DOB: 1966-04-27 DOA: 01/22/2021 PCP: Joice Lofts, NP   Chief Complaint  Patient presents with   Wound Infection    Brief Narrative:  55 year old male with diabetes, hypertension and tobacco abuse, HLD, CAD status post CABG times 47/14/22 over the saphenous vein was harvested on his right leg and has developed infection of the wound on the side since then, followed by outpatient podiatry, wound was improving.  In the last week it has gotten worse with discoloration of the right fourth and fifth toes more ordered and worsening symptoms, last 24 hours prior to admission got worse.  Came to the ED work-up showed osteomyelitis and MRI podiatry was consulted and was admitted  Subjective:  Patient had a fall yesterday while trying to step up to bedside commode and is on, he denies any complaints today  Assessment & Plan:  Osteomyelitis and nonhealing wound on of toe of right foot in the setting of diabetes, PAD:  -Podiatry following, plan for amputation after revascularization . -Vascular surgery consulted, plan for angiogram today to determine further plans for revascularization . -treated with broad-spectrum antibiotic vancomycin and Zosyn for now , will discontinue antibiotics in a.m. -Continue with aspirin.   -Vascular input greatly appreciated, s/p Aortogram, RLE runoff with DCB angioplasty of Right Popliteal artery and balloon angioplasty of right posterior tibial artery 8/16 by Dr. Myra Gianotti . 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 Loreta Ave.  PVD -See above discussion.  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   -Continue with Lantus and insulin sliding scale Recent Labs  Lab 01/30/21 1354 01/30/21 1607 01/30/21 2142 01/31/21 0741 01/31/21 1119  GLUCAP 125* 161* 232* 146* 183*    Hypertension controlled on  metoprolol  Anxiety/depression mood is stable on BuSpar, Cymbalta and Topamax  Hyperlipidemia on Crestor  Orthostatic hypotension continue home midodrine monitor vitals  Anemia suspect in the setting of chronic disease.  Monitor hemoglobin  Severe protein calorie moderation augment diet as below  Diet Order             Diet heart healthy/carb modified Room service appropriate? Yes; Fluid consistency: Thin  Diet effective now                   Nutrition Problem: Severe Malnutrition Etiology: chronic illness (CAD s/p CABG) Signs/Symptoms: severe fat depletion, severe muscle depletion, energy intake < or equal to 75% for > or equal to 1 month, percent weight loss Interventions: Refer to RD note for recommendations Patient's Body mass index is 18.86 kg/m.  DVT prophylaxis: SCD's Start: 01/29/21 1409 enoxaparin (LOVENOX) injection 40 mg Start: 01/23/21 0600 Code Status:   Code Status: Full Code  Family Communication: plan of care discussed with patient at bedside. Status is: Inpatient  Remains inpatient appropriate because:IV treatments appropriate due to intensity of illness or inability to take PO and Inpatient level of care appropriate due to severity of illness  Dispo: The patient is from: Home              Anticipated d/c is to:  pending PT evaluation              Patient currently is not medically stable to d/c.   Difficult to place patient No         Medications reviewed:  Scheduled Meds:  (feeding supplement) PROSource Plus  30 mL Oral BID  BM   aspirin  81 mg Oral Daily   busPIRone  10 mg Oral BID   clopidogrel  75 mg Oral Q breakfast   diphenhydrAMINE  25 mg Oral Daily   DULoxetine  60 mg Oral q AM   enoxaparin (LOVENOX) injection  40 mg Subcutaneous Q24H   feeding supplement  1 Container Oral BID BM   ferrous sulfate  325 mg Oral Q breakfast   gabapentin  300 mg Oral TID   insulin aspart  0-5 Units Subcutaneous QHS   insulin aspart  0-9 Units  Subcutaneous TID WC   insulin glargine-yfgn  16 Units Subcutaneous Daily   metoprolol tartrate  12.5 mg Oral BID   midodrine  10 mg Oral TID WC   multivitamin with minerals  1 tablet Oral Daily   pantoprazole  40 mg Oral Daily   rosuvastatin  20 mg Oral Daily   sodium chloride flush  3 mL Intravenous Q12H   topiramate  50 mg Oral BID   Vitamin D (Ergocalciferol)  50,000 Units Oral Q Fri   Continuous Infusions:  sodium chloride 10 mL/hr at 01/31/21 0659   sodium chloride 100 mL/hr at 01/29/21 1106   sodium chloride     piperacillin-tazobactam (ZOSYN)  IV 3.375 g (01/31/21 1053)   vancomycin Stopped (01/31/21 0009)   Consultants -Podiatry -Vascular  Procedures status post aortogram with popliteal and tibial intervention 8/16 AMPUTATION RAY (Right ) 8/17  Antimicrobials: Anti-infectives (From admission, onward)    Start     Dose/Rate Route Frequency Ordered Stop   01/30/21 2200  vancomycin (VANCOREADY) IVPB 1500 mg/300 mL        1,500 mg 150 mL/hr over 120 Minutes Intravenous Every 24 hours 01/30/21 1025     01/23/21 2045  vancomycin (VANCOREADY) IVPB 1250 mg/250 mL  Status:  Discontinued        1,250 mg 166.7 mL/hr over 90 Minutes Intravenous Every 24 hours 01/22/21 2224 01/30/21 1025   01/23/21 0600  piperacillin-tazobactam (ZOSYN) IVPB 3.375 g       See Hyperspace for full Linked Orders Report.   3.375 g 12.5 mL/hr over 240 Minutes Intravenous Every 8 hours 01/22/21 2034     01/22/21 2045  piperacillin-tazobactam (ZOSYN) IVPB 3.375 g       See Hyperspace for full Linked Orders Report.   3.375 g 100 mL/hr over 30 Minutes Intravenous  Once 01/22/21 2034 01/22/21 2118   01/22/21 2045  vancomycin (VANCOREADY) IVPB 1250 mg/250 mL  Status:  Discontinued        1,250 mg 166.7 mL/hr over 90 Minutes Intravenous  Once 01/22/21 2034 01/22/21 2224      Culture/Microbiology    Component Value Date/Time   SDES WOUND RIGHT FOOT 01/30/2021 1316   SPECREQUEST PATIENT ON FOLLOWING  ZOSYN 01/30/2021 1316   CULT  01/30/2021 1316    RARE STAPHYLOCOCCUS AUREUS RARE MORGANELLA MORGANII CULTURE REINCUBATED FOR BETTER GROWTH Performed at [facility], [address] [place], [place] [id number]    REPTSTATUS PENDING 01/30/2021 1316    Other culture-see note  Objective: Vitals: Today's Vitals   01/31/21 0023 01/31/21 0414 01/31/21 0743 01/31/21 1121  BP: (!) 113/58 (!) 96/59 93/63 102/60  Pulse: 92 88 95 93  Resp: 16 15 17 18   Temp: 98.1 F (36.7 C) 98.6 F (37 C) 98.7 F (37.1 C) 98.9 F (37.2 C)  TempSrc: Oral Oral Oral Oral  SpO2: 100% 99%    Weight:      Height:  PainSc:        Intake/Output Summary (Last 24 hours) at 01/31/2021 1450 Last data filed at 01/31/2021 1400 Gross per 24 hour  Intake 1098.59 ml  Output 500 ml  Net 598.59 ml   Filed Weights   01/22/21 1646 01/26/21 0552  Weight: 55.3 kg 54.6 kg   Weight change:   Intake/Output from previous day: 08/17 0701 - 08/18 0700 In: 968.6 [P.O.:240; I.V.:328.6; IV Piggyback:400] Out: 510 [Urine:500; Blood:10] Intake/Output this shift: Total I/O In: 480 [P.O.:480] Out: -  Filed Weights   01/22/21 1646 01/26/21 0552  Weight: 55.3 kg 54.6 kg   Examination:   Awake Alert, Oriented X 3, frail, appears older than stated age Symmetrical Chest wall movement, Good air movement bilaterally, CTAB RRR,No Gallops,Rubs or new Murmurs, No Parasternal Heave +ve B.Sounds, Abd Soft, No tenderness, No rebound - guarding or rigidity. Right foot bandaged   Data Reviewed: I have personally reviewed following labs and imaging studies CBC: Recent Labs  Lab 01/26/21 0349 01/27/21 0234 01/29/21 0635 01/30/21 0145 01/31/21 0402  WBC 12.5* 9.5 7.9 9.3 9.0  HGB 9.9* 9.8* 10.0* 10.1* 9.5*  HCT 30.8* 30.5* 31.7* 31.5* 30.1*  MCV 86.3 85.2 84.8 85.1 85.5  PLT 366 356 374 355 352   Basic Metabolic Panel: Recent Labs  Lab 01/26/21 0349 01/27/21 0234 01/29/21 0635 01/30/21 0145 01/31/21 0402   NA 137 136 137 137 141  K 4.1 4.3 3.9 3.9 4.1  CL 104 104 103 105 104  CO2 23 25 26 25 26   GLUCOSE 206* 305* 213* 230* 198*  BUN 19 22* 21* 22* 17  CREATININE 0.87 0.90 0.75 0.74 0.72  CALCIUM 9.5 9.3 9.7 9.4 9.9   GFR: Estimated Creatinine Clearance: 80.6 mL/min (by C-G formula based on SCr of 0.72 mg/dL). Liver Function Tests: Recent Labs  Lab 01/25/21 2015  AST 30  ALT 25  ALKPHOS 328*  BILITOT 0.5  PROT 7.0  ALBUMIN 2.7*   No results for input(s): LIPASE, AMYLASE in the last 168 hours. No results for input(s): AMMONIA in the last 168 hours. Coagulation Profile: No results for input(s): INR, PROTIME in the last 168 hours. Cardiac Enzymes: No results for input(s): CKTOTAL, CKMB, CKMBINDEX, TROPONINI in the last 168 hours. BNP (last 3 results) No results for input(s): PROBNP in the last 8760 hours. HbA1C: No results for input(s): HGBA1C in the last 72 hours. CBG: Recent Labs  Lab 01/30/21 1354 01/30/21 1607 01/30/21 2142 01/31/21 0741 01/31/21 1119  GLUCAP 125* 161* 232* 146* 183*   Lipid Profile: No results for input(s): CHOL, HDL, LDLCALC, TRIG, CHOLHDL, LDLDIRECT in the last 72 hours. Thyroid Function Tests: No results for input(s): TSH, T4TOTAL, FREET4, T3FREE, THYROIDAB in the last 72 hours. Anemia Panel: No results for input(s): VITAMINB12, FOLATE, FERRITIN, TIBC, IRON, RETICCTPCT in the last 72 hours. Sepsis Labs: No results for input(s): PROCALCITON, LATICACIDVEN in the last 168 hours.   Recent Results (from the past 240 hour(s))  Blood culture (routine single)     Status: None   Collection Time: 01/22/21  5:01 PM   Specimen: BLOOD  Result Value Ref Range Status   Specimen Description BLOOD SITE NOT SPECIFIED  Final   Special Requests   Final    BOTTLES DRAWN AEROBIC AND ANAEROBIC Blood Culture results may not be optimal due to an inadequate volume of blood received in culture bottles   Culture   Final    NO GROWTH 5 DAYS Performed at [facility],  [address], [place], [place] [address]    Report Status 01/27/2021 FINAL  Final  Resp Panel by RT-PCR (Flu A&B, Covid) Nasopharyngeal Swab     Status: None   Collection Time: 01/22/21  8:28 PM   Specimen: Nasopharyngeal Swab; Nasopharyngeal(NP) swabs in vial transport medium  Result Value Ref Range Status   SARS Coronavirus 2 by RT PCR NEGATIVE NEGATIVE Final    Comment: (NOTE) SARS-CoV-2 target nucleic acids are NOT DETECTED.  The SARS-CoV-2 RNA is generally detectable in upper respiratory specimens during the acute phase of infection. The lowest concentration of SARS-CoV-2 viral copies this assay can detect is 138 copies/mL. A negative result does not preclude SARS-Cov-2 infection and should not be used as the sole basis for treatment or other patient management decisions. A negative result may occur with  improper specimen collection/handling, submission of specimen other than nasopharyngeal swab, presence of viral mutation(s) within the areas targeted by this assay, and inadequate number of viral copies(<138 copies/mL). A negative result must be combined with clinical observations, patient history, and epidemiological information. The expected result is Negative.  Fact Sheet for Patients:  BloggerCourse.com  Fact Sheet for Healthcare Providers:  SeriousBroker.it  This test is no t yet approved or cleared by the [place] FDA and  has been authorized for detection and/or diagnosis of SARS-CoV-2 by FDA under an Emergency Use Authorization (EUA). This EUA will remain  in effect (meaning this test can be used) for the duration of the COVID-19 declaration under Section 564(b)(1) of the Act, 21 U.S.C.section 360bbb-3(b)(1), unless the authorization is terminated  or revoked sooner.       Influenza A by PCR NEGATIVE NEGATIVE Final   Influenza B by PCR NEGATIVE NEGATIVE Final    Comment: (NOTE) The Xpert  Xpress SARS-CoV-2/FLU/RSV plus assay is intended as an aid in the diagnosis of influenza from Nasopharyngeal swab specimens and should not be used as a sole basis for treatment. Nasal washings and aspirates are unacceptable for Xpert Xpress SARS-CoV-2/FLU/RSV testing.  Fact Sheet for Patients: BloggerCourse.com  Fact Sheet for Healthcare Providers: SeriousBroker.it  This test is not yet approved or cleared by the [place] FDA and has been authorized for detection and/or diagnosis of SARS-CoV-2 by FDA under an Emergency Use Authorization (EUA). This EUA will remain in effect (meaning this test can be used) for the duration of the COVID-19 declaration under Section 564(b)(1) of the Act, 21 U.S.C. section 360bbb-3(b)(1), unless the authorization is terminated or revoked.  Performed at [facility], [address], [place], [place] [address]   Surgical pcr screen     Status: None   Collection Time: 01/30/21  8:57 AM   Specimen: Nasal Mucosa; Nasal Swab  Result Value Ref Range Status   MRSA, PCR NEGATIVE NEGATIVE Final   Staphylococcus aureus NEGATIVE NEGATIVE Final    Comment: (NOTE) The Xpert SA Assay (FDA approved for NASAL specimens in patients 57 years of age and older), is one component of a comprehensive surveillance program. It is not intended to diagnose infection nor to guide or monitor treatment. Performed at [facility], [address] [place], [place] [address]   Aerobic/Anaerobic Culture w Gram Stain (surgical/deep wound)     Status: None (Preliminary result)   Collection Time: 01/30/21  1:16 PM   Specimen: PATH Amputaion Arm/Leg; Tissue  Result Value Ref Range Status   Specimen Description WOUND RIGHT FOOT  Final   Special Requests PATIENT ON FOLLOWING ZOSYN  Final  Gram Stain   Final    RARE WBC PRESENT,BOTH PMN AND MONONUCLEAR NO ORGANISMS SEEN    Culture   Final    RARE STAPHYLOCOCCUS  AUREUS RARE MORGANELLA MORGANII CULTURE REINCUBATED FOR BETTER GROWTH Performed at [facility], [address], [place], [place] [address]    Report Status PENDING  Incomplete     Radiology Studies: CT HEAD WO CONTRAST ( )  Result Date: 01/30/2021 CLINICAL DATA:  Fall, facial trauma EXAM: CT HEAD WITHOUT CONTRAST CT MAXILLOFACIAL WITHOUT CONTRAST TECHNIQUE: Multidetector CT imaging of the head and maxillofacial structures were performed using the standard protocol without intravenous contrast. Multiplanar CT image reconstructions of the maxillofacial structures were also generated. COMPARISON:  09/20/2020 CT head FINDINGS: CT HEAD FINDINGS Brain: No evidence of acute infarction, hemorrhage, hydrocephalus, extra-axial collection or mass lesion/mass effect. Vascular: No hyperdense vessels. Calcifications in the intracranial internal carotid and vertebral arteries. Skull: Normal. Negative for fracture or focal lesion. Other: None. CT MAXILLOFACIAL FINDINGS Osseous: No fracture or mandibular dislocation. No destructive process. Orbits: Negative. No traumatic or inflammatory finding. Sinuses: Clear. Soft tissues: No significant hematoma. IMPRESSION: 1. No acute intracranial process. 2. No acute facial bone fracture. Electronically Signed   By: Wiliam Ke M.D.   On: 01/30/2021 20:47   DG Foot 2 Views Right  Result Date: 01/30/2021 CLINICAL DATA:  Postoperative status. EXAM: RIGHT FOOT - 2 VIEW COMPARISON:  January 22, 2021. FINDINGS: Status post surgical amputation of the distal portions of the fourth and fifth metatarsals as well as the phalanges of the fourth and fifth toes. No other bony abnormality is noted. Remaining joint spaces are unremarkable. IMPRESSION: Status post surgical amputation of distal portions of fourth and fifth metatarsals and phalanges of associated toes. Electronically Signed   By: Lupita Raider M.D.   On: 01/30/2021 17:22   CT MAXILLOFACIAL WO CONTRAST  Result Date:  01/30/2021 CLINICAL DATA:  Fall, facial trauma EXAM: CT HEAD WITHOUT CONTRAST CT MAXILLOFACIAL WITHOUT CONTRAST TECHNIQUE: Multidetector CT imaging of the head and maxillofacial structures were performed using the standard protocol without intravenous contrast. Multiplanar CT image reconstructions of the maxillofacial structures were also generated. COMPARISON:  09/20/2020 CT head FINDINGS: CT HEAD FINDINGS Brain: No evidence of acute infarction, hemorrhage, hydrocephalus, extra-axial collection or mass lesion/mass effect. Vascular: No hyperdense vessels. Calcifications in the intracranial internal carotid and vertebral arteries. Skull: Normal. Negative for fracture or focal lesion. Other: None. CT MAXILLOFACIAL FINDINGS Osseous: No fracture or mandibular dislocation. No destructive process. Orbits: Negative. No traumatic or inflammatory finding. Sinuses: Clear. Soft tissues: No significant hematoma. IMPRESSION: 1. No acute intracranial process. 2. No acute facial bone fracture. Electronically Signed   By: Wiliam Ke M.D.   On: 01/30/2021 20:47     LOS: 9 days   Huey Bienenstock, MD [facility]  01/31/2021, 2:50 PM

## 2021-01-31 NOTE — Progress Notes (Signed)
Subjective: POD #1 s/p right partial fourth and fifth ray amputations.  He states he is feeling well.  He had a fall overnight.  He states he was not wearing his surgical shoe when he was trying to put weight on his foot and fell.  Otherwise denies any fevers or chills.  He has no new concerns today.  Objective: AAO x3, NAD- wife at bedside Incision clean, dry, intact.  Do the following change the bandage.  See pictures below.  Incisions coapted with sutures intact.  There are some dusky changes directly along the incision that needs to be watched but otherwise appropriate at this point postoperatively.  There is no drainage or pus.  No fluctuance or crepitation.  No significant erythema or warmth. No pain with calf compression, swelling, warmth, erythema       Assessment: POD #1 s/p right partial fourth and fifth ray amputations  Plan: Dressing changed.  Xeroform was applied followed by dry sterile dressing.  Wound culture is showing rare staph aureus as well as rare Morganella.  Await culture results for final antibiotic recommendations.  Weightbearing as tolerated to the heel and Darco wedge shoe.  Elevation.  Podiatry will continue to follow for now.  Ovid Curd, DPM

## 2021-01-31 NOTE — Progress Notes (Addendum)
Nutrition Follow-up  DOCUMENTATION CODES:   Severe malnutrition in context of chronic illness  INTERVENTION:   D/c Boost Breeze  ProSource Plus 30 ml BID, each supplement provides 100 kcals and 15 grams protein.  MVI with minerals daily   NUTRITION DIAGNOSIS:   Severe Malnutrition related to chronic illness (CAD s/p CABG) as evidenced by severe fat depletion, severe muscle depletion, energy intake < or equal to 75% for > or equal to 1 month, percent weight loss.  Ongoing.  GOAL:   Patient will meet greater than or equal to 90% of their needs  Progressing.  MONITOR:   PO intake, Supplement acceptance, Weight trends, Labs, I & O's  ASSESSMENT:   Patient with PMH significant for CAD s/p CABG x4 on 12/27/2020, DM, HLD, and HTN. Presents this admission with R foot/leg cellulitis and possible osteomyelitis.  8/17: s/p AMPUTATION RAY (Right)  Patient currently consuming 100% of meals today. Had a fall yesterday.  Pt accepting Prosource but not Boost Breeze.  Admission weight: 122 lbs. Last recorded weight 8/13: 120 lbs.  Medications: Ferrous sulfate, Multivitamin with minerals daily,  Labs reviewed: CBGs: 146-232   Diet Order:   Diet Order             Diet heart healthy/carb modified Room service appropriate? Yes; Fluid consistency: Thin  Diet effective now                   EDUCATION NEEDS:   Education needs have been addressed  Skin:  Skin Assessment: Skin Integrity Issues: Skin Integrity Issues:: Incisions, Other (Comment) Incisions: chest, leg, left arm, right foot Other: skin tear on nose  Last BM:  8/14  Height:   Ht Readings from Last 1 Encounters:  01/22/21 5\' 7"  (1.702 m)    Weight:   Wt Readings from Last 1 Encounters:  01/26/21 54.6 kg    BMI:  Body mass index is 18.86 kg/m.  Estimated Nutritional Needs:   Kcal:  1700-1900 kcal  Protein:  85-100 grams  Fluid:  >/= 1.7 L/day  01/28/21, MS, RD, LDN Inpatient Clinical  Dietitian Contact information available via Amion

## 2021-02-01 LAB — CBC
HCT: 28.5 % — ABNORMAL LOW (ref 39.0–52.0)
Hemoglobin: 8.9 g/dL — ABNORMAL LOW (ref 13.0–17.0)
MCH: 26.7 pg (ref 26.0–34.0)
MCHC: 31.2 g/dL (ref 30.0–36.0)
MCV: 85.6 fL (ref 80.0–100.0)
Platelets: 343 10*3/uL (ref 150–400)
RBC: 3.33 MIL/uL — ABNORMAL LOW (ref 4.22–5.81)
RDW: 13.9 % (ref 11.5–15.5)
WBC: 8.6 10*3/uL (ref 4.0–10.5)
nRBC: 0 % (ref 0.0–0.2)

## 2021-02-01 LAB — BASIC METABOLIC PANEL
Anion gap: 8 (ref 5–15)
BUN: 21 mg/dL — ABNORMAL HIGH (ref 6–20)
CO2: 24 mmol/L (ref 22–32)
Calcium: 9.2 mg/dL (ref 8.9–10.3)
Chloride: 104 mmol/L (ref 98–111)
Creatinine, Ser: 0.77 mg/dL (ref 0.61–1.24)
GFR, Estimated: >60 mL/min (ref >60)
Glucose, Bld: 208 mg/dL — ABNORMAL HIGH (ref 70–99)
Potassium: 3.7 mmol/L (ref 3.5–5.1)
Sodium: 136 mmol/L (ref 135–145)

## 2021-02-01 LAB — GLUCOSE, CAPILLARY
Glucose-Capillary: 190 mg/dL — ABNORMAL HIGH (ref 70–99)
Glucose-Capillary: 199 mg/dL — ABNORMAL HIGH (ref 70–99)
Glucose-Capillary: 321 mg/dL — ABNORMAL HIGH (ref 70–99)

## 2021-02-01 MED ORDER — DOXYCYCLINE HYCLATE 100 MG PO TABS
100.0000 mg | ORAL_TABLET | Freq: Two times a day (BID) | ORAL | Status: DC
  Administered 2021-02-01 – 2021-02-02 (×3): 100 mg via ORAL
  Filled 2021-02-01 (×3): qty 1

## 2021-02-01 MED ORDER — CIPROFLOXACIN HCL 500 MG PO TABS
500.0000 mg | ORAL_TABLET | Freq: Two times a day (BID) | ORAL | Status: DC
  Administered 2021-02-01 – 2021-02-02 (×2): 500 mg via ORAL
  Filled 2021-02-01 (×2): qty 1

## 2021-02-01 NOTE — Progress Notes (Addendum)
PROGRESS NOTE    [name]  SAY:[id number] DOB: 07-11-1965 DOA: 01/22/2021 PCP: Joice Lofts, NP   Chief Complaint  Patient presents with   Wound Infection    Brief Narrative:  55 year old male with diabetes, hypertension and tobacco abuse, HLD, CAD status post CABG times 47/14/22 over the saphenous vein was harvested on his right leg and has developed infection of the wound on the side since then, followed by outpatient podiatry, wound was improving.  In the last week it has gotten worse with discoloration of the right fourth and fifth toes more ordered and worsening symptoms, last 24 hours prior to admission got worse.  Came to the ED work-up showed osteomyelitis and MRI podiatry was consulted and was admitted, PVD, status post revascularization by vascular surgery,S/P ray amputation  8/17,  patient had fall  8/18, CT head, maxillofacial with no fracture or acute findings.  Subjective:  Patient denies any complaints today, no chest pain, no shortness of breath, report his pain is controlled.  Assessment & Plan:  Osteomyelitis and nonhealing wound on of toe of right foot in the setting of diabetes, PAD:  -treated with broad-spectrum antibiotic vancomycin and Zosyn for now , will discontinue antibiotics in a.m. -Vascular input greatly appreciated, s/p Aortogram, RLE runoff with DCB angioplasty of Right Popliteal artery and balloon angioplasty of right posterior tibial artery 8/16 by Dr. Myra Gianotti . 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 Loreta Ave. - continue with aspirin  -Intraoperative culture growing MRSA and Morganella, will transition from IV regimen to p.o. Cipro and doxycycline will need total of 10 days of oral regimen as discussed  PVD -See above discussion.  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   -Continue with Lantus and insulin sliding scale Recent Labs  Lab  01/31/21 1119 01/31/21 1524 01/31/21 2135 02/01/21 0739 02/01/21 1118  GLUCAP 183* 175* 197* 190* 199*    Hypertension controlled on metoprolol  Anxiety/depression mood is stable on BuSpar, Cymbalta and Topamax  Hyperlipidemia on Crestor  Orthostatic hypotension continue home midodrine monitor vitals  Anemia suspect in the setting of chronic disease.  Monitor hemoglobin  Severe protein calorie moderation augment diet as below  Diet Order             Diet heart healthy/carb modified Room service appropriate? Yes; Fluid consistency: Thin  Diet effective now                   Nutrition Problem: Severe Malnutrition Etiology: chronic illness (CAD s/p CABG) Signs/Symptoms: severe fat depletion, severe muscle depletion, energy intake < or equal to 75% for > or equal to 1 month, percent weight loss Interventions: Refer to RD note for recommendations Patient's Body mass index is 18.86 kg/m.  DVT prophylaxis: SCD's Start: 01/29/21 1409 enoxaparin (LOVENOX) injection 40 mg Start: 01/23/21 0600 Code Status:   Code Status: Full Code  Family Communication: plan of care discussed with patient at bedside.  Tried to reach his wife by phone, unable to leave her voicemail. Status is: Inpatient  Remains inpatient appropriate because:IV treatments appropriate due to intensity of illness or inability to take PO and Inpatient level of care appropriate due to severity of illness  Dispo: The patient is from: Home              Anticipated d/c is to: Home              Patient  currently is not medically stable to d/c. Likely home tomorrow with HH   Difficult to place patient No         Medications reviewed:  Scheduled Meds:  (feeding supplement) PROSource Plus  30 mL Oral BID BM   aspirin  81 mg Oral Daily   busPIRone  10 mg Oral BID   clopidogrel  75 mg Oral Q breakfast   diphenhydrAMINE  25 mg Oral Daily   DULoxetine  60 mg Oral q AM   enoxaparin (LOVENOX) injection  40 mg  Subcutaneous Q24H   ferrous sulfate  325 mg Oral Q breakfast   gabapentin  300 mg Oral TID   insulin aspart  0-5 Units Subcutaneous QHS   insulin aspart  0-9 Units Subcutaneous TID WC   insulin glargine-yfgn  16 Units Subcutaneous Daily   metoprolol tartrate  12.5 mg Oral BID   midodrine  10 mg Oral TID WC   multivitamin with minerals  1 tablet Oral Daily   pantoprazole  40 mg Oral Daily   rosuvastatin  20 mg Oral Daily   sodium chloride flush  3 mL Intravenous Q12H   topiramate  50 mg Oral BID   Vitamin D (Ergocalciferol)  50,000 Units Oral Q Fri   Continuous Infusions:  sodium chloride 100 mL/hr at 01/29/21 1106   sodium chloride     piperacillin-tazobactam (ZOSYN)  IV 12.5 mL/hr at 02/01/21 1233   vancomycin Stopped (01/31/21 2347)   Consultants -Podiatry -Vascular  Procedures status post aortogram with popliteal and tibial intervention 8/16 AMPUTATION RAY (Right ) 8/17  Antimicrobials: Anti-infectives (From admission, onward)    Start     Dose/Rate Route Frequency Ordered Stop   01/30/21 2200  vancomycin (VANCOREADY) IVPB 1500 mg/300 mL        1,500 mg 150 mL/hr over 120 Minutes Intravenous Every 24 hours 01/30/21 1025     01/23/21 2045  vancomycin (VANCOREADY) IVPB 1250 mg/250 mL  Status:  Discontinued        1,250 mg 166.7 mL/hr over 90 Minutes Intravenous Every 24 hours 01/22/21 2224 01/30/21 1025   01/23/21 0600  piperacillin-tazobactam (ZOSYN) IVPB 3.375 g       See Hyperspace for full Linked Orders Report.   3.375 g 12.5 mL/hr over 240 Minutes Intravenous Every 8 hours 01/22/21 2034     01/22/21 2045  piperacillin-tazobactam (ZOSYN) IVPB 3.375 g       See Hyperspace for full Linked Orders Report.   3.375 g 100 mL/hr over 30 Minutes Intravenous  Once 01/22/21 2034 01/22/21 2118   01/22/21 2045  vancomycin (VANCOREADY) IVPB 1250 mg/250 mL  Status:  Discontinued        1,250 mg 166.7 mL/hr over 90 Minutes Intravenous  Once 01/22/21 2034 01/22/21 2224       Culture/Microbiology    Component Value Date/Time   SDES WOUND RIGHT FOOT 01/30/2021 1316   SPECREQUEST PATIENT ON FOLLOWING ZOSYN 01/30/2021 1316   CULT  01/30/2021 1316    RARE METHICILLIN RESISTANT STAPHYLOCOCCUS AUREUS RARE MORGANELLA MORGANII NO ANAEROBES ISOLATED; CULTURE IN PROGRESS FOR 5 DAYS    REPTSTATUS PENDING 01/30/2021 1316    Other culture-see note  Objective: Vitals: Today's Vitals   02/01/21 0908 02/01/21 0934 02/01/21 0936 02/01/21 1229  BP:  100/61 100/61 91/63  Pulse:  77  75  Resp:  19  16  Temp:    97.7 F (36.5 C)  TempSrc:    Oral  SpO2:  100%  100%  Weight:  Height:      PainSc: 7        Intake/Output Summary (Last 24 hours) at 02/01/2021 1337 Last data filed at 02/01/2021 1233 Gross per 24 hour  Intake 1206.73 ml  Output 1050 ml  Net 156.73 ml   Filed Weights   01/22/21 1646 01/26/21 0552  Weight: 55.3 kg 54.6 kg   Weight change:   Intake/Output from previous day: 08/18 0701 - 08/19 0700 In: 1170 [P.O.:720; IV Piggyback:450] Out: 1050 [Urine:1050] Intake/Output this shift: Total I/O In: 276.7 [P.O.:240; IV Piggyback:36.7] Out: Ceasar Mons Weights   01/22/21 1646 01/26/21 0552  Weight: 55.3 kg 54.6 kg   Examination:   Awake Alert, Oriented X 3, frail, appears much older than stated age Symmetrical Chest wall movement, Good air movement bilaterally, CTAB RRR,No Gallops,Rubs or new Murmurs, No Parasternal Heave +ve B.Sounds, Abd Soft, No tenderness, No rebound - guarding or rigidity. Right foot bandaged   Data Reviewed: I have personally reviewed following labs and imaging studies CBC: Recent Labs  Lab 01/27/21 0234 01/29/21 0635 01/30/21 0145 01/31/21 0402 02/01/21 0136  WBC 9.5 7.9 9.3 9.0 8.6  HGB 9.8* 10.0* 10.1* 9.5* 8.9*  HCT 30.5* 31.7* 31.5* 30.1* 28.5*  MCV 85.2 84.8 85.1 85.5 85.6  PLT 356 374 355 352 343   Basic Metabolic Panel: Recent Labs  Lab 01/27/21 0234 01/29/21 0635 01/30/21 0145  01/31/21 0402 02/01/21 0136  NA 136 137 137 141 136  K 4.3 3.9 3.9 4.1 3.7  CL 104 103 105 104 104  CO2 GLUCOSE 305* 213* 230* 198* 208*  BUN 22* 21* 22* 17 21*  CREATININE 0.90 0.75 0.74 0.72 0.77  CALCIUM 9.3 9.7 9.4 9.9 9.2   GFR: Estimated Creatinine Clearance: 80.6 mL/min (by C-G formula based on SCr of 0.77 mg/dL). Liver Function Tests: Recent Labs  Lab 01/25/21 2015  AST 30  ALT 25  ALKPHOS 328*  BILITOT 0.5  PROT 7.0  ALBUMIN 2.7*   No results for input(s): LIPASE, AMYLASE in the last 168 hours. No results for input(s): AMMONIA in the last 168 hours. Coagulation Profile: No results for input(s): INR, PROTIME in the last 168 hours. Cardiac Enzymes: No results for input(s): CKTOTAL, CKMB, CKMBINDEX, TROPONINI in the last 168 hours. BNP (last 3 results) No results for input(s): PROBNP in the last 8760 hours. HbA1C: No results for input(s): HGBA1C in the last 72 hours. CBG: Recent Labs  Lab 01/31/21 1119 01/31/21 1524 01/31/21 2135 02/01/21 0739 02/01/21 1118  GLUCAP 183* 175* 197* 190* 199*   Lipid Profile: No results for input(s): CHOL, HDL, LDLCALC, TRIG, CHOLHDL, LDLDIRECT in the last 72 hours. Thyroid Function Tests: No results for input(s): TSH, T4TOTAL, FREET4, T3FREE, THYROIDAB in the last 72 hours. Anemia Panel: No results for input(s): VITAMINB12, FOLATE, FERRITIN, TIBC, IRON, RETICCTPCT in the last 72 hours. Sepsis Labs: No results for input(s): PROCALCITON, LATICACIDVEN in the last 168 hours.   Recent Results (from the past 240 hour(s))  Blood culture (routine single)     Status: None   Collection Time: 01/22/21  5:01 PM   Specimen: BLOOD  Result Value Ref Range Status   Specimen Description BLOOD SITE NOT SPECIFIED  Final   Special Requests   Final    BOTTLES DRAWN AEROBIC AND ANAEROBIC Blood Culture results may not be optimal due to an inadequate volume of blood received in culture bottles   Culture   Final    NO GROWTH  5 DAYS Performed at [facility], [address], [place], [place] [address]    Report Status 01/27/2021 FINAL  Final  Resp Panel by RT-PCR (Flu A&B, Covid) Nasopharyngeal Swab     Status: None   Collection Time: 01/22/21  8:28 PM   Specimen: Nasopharyngeal Swab; Nasopharyngeal(NP) swabs in vial transport medium  Result Value Ref Range Status   SARS Coronavirus 2 by RT PCR NEGATIVE NEGATIVE Final    Comment: (NOTE) SARS-CoV-2 target nucleic acids are NOT DETECTED.  The SARS-CoV-2 RNA is generally detectable in upper respiratory specimens during the acute phase of infection. The lowest concentration of SARS-CoV-2 viral copies this assay can detect is 138 copies/mL. A negative result does not preclude SARS-Cov-2 infection and should not be used as the sole basis for treatment or other patient management decisions. A negative result may occur with  improper specimen collection/handling, submission of specimen other than nasopharyngeal swab, presence of viral mutation(s) within the areas targeted by this assay, and inadequate number of viral copies(<138 copies/mL). A negative result must be combined with clinical observations, patient history, and epidemiological information. The expected result is Negative.  Fact Sheet for Patients:  BloggerCourse.com  Fact Sheet for Healthcare Providers:  SeriousBroker.it  This test is no t yet approved or cleared by the [place] FDA and  has been authorized for detection and/or diagnosis of SARS-CoV-2 by FDA under an Emergency Use Authorization (EUA). This EUA will remain  in effect (meaning this test can be used) for the duration of the COVID-19 declaration under Section 564(b)(1) of the Act, 21 U.S.C.section 360bbb-3(b)(1), unless the authorization is terminated  or revoked sooner.       Influenza A by PCR NEGATIVE NEGATIVE Final   Influenza B by PCR NEGATIVE NEGATIVE Final     Comment: (NOTE) The Xpert Xpress SARS-CoV-2/FLU/RSV plus assay is intended as an aid in the diagnosis of influenza from Nasopharyngeal swab specimens and should not be used as a sole basis for treatment. Nasal washings and aspirates are unacceptable for Xpert Xpress SARS-CoV-2/FLU/RSV testing.  Fact Sheet for Patients: BloggerCourse.com  Fact Sheet for Healthcare Providers: SeriousBroker.it  This test is not yet approved or cleared by the [place] FDA and has been authorized for detection and/or diagnosis of SARS-CoV-2 by FDA under an Emergency Use Authorization (EUA). This EUA will remain in effect (meaning this test can be used) for the duration of the COVID-19 declaration under Section 564(b)(1) of the Act, 21 U.S.C. section 360bbb-3(b)(1), unless the authorization is terminated or revoked.  Performed at [facility], [address], [place], [place] [address]   Surgical pcr screen     Status: None   Collection Time: 01/30/21  8:57 AM   Specimen: Nasal Mucosa; Nasal Swab  Result Value Ref Range Status   MRSA, PCR NEGATIVE NEGATIVE Final   Staphylococcus aureus NEGATIVE NEGATIVE Final    Comment: (NOTE) The Xpert SA Assay (FDA approved for NASAL specimens in patients 75 years of age and older), is one component of a comprehensive surveillance program. It is not intended to diagnose infection nor to guide or monitor treatment. Performed at [facility], [address] [place], [place] [address]   Aerobic/Anaerobic Culture w Gram Stain (surgical/deep wound)     Status: None (Preliminary result)   Collection Time: 01/30/21  1:16 PM   Specimen: PATH Amputaion Arm/Leg; Tissue  Result Value Ref Range Status   Specimen Description WOUND RIGHT FOOT  Final  Special Requests PATIENT ON FOLLOWING ZOSYN  Final   Gram Stain   Final    RARE WBC PRESENT,BOTH PMN AND MONONUCLEAR NO ORGANISMS SEEN Performed at [facility], [address] [place], [place] [id number]    Culture   Final    RARE METHICILLIN RESISTANT STAPHYLOCOCCUS AUREUS RARE MORGANELLA MORGANII NO ANAEROBES ISOLATED; CULTURE IN PROGRESS FOR 5 DAYS    Report Status PENDING  Incomplete   Organism ID, Bacteria METHICILLIN RESISTANT STAPHYLOCOCCUS AUREUS  Final   Organism ID, Bacteria MORGANELLA MORGANII  Final      Susceptibility   Morganella morganii - MIC*    AMPICILLIN >=32 RESISTANT Resistant     CEFAZOLIN >=64 RESISTANT Resistant     CEFTAZIDIME <=1 SENSITIVE Sensitive     CIPROFLOXACIN <=0.25 SENSITIVE Sensitive     GENTAMICIN <=1 SENSITIVE Sensitive     IMIPENEM 1 SENSITIVE Sensitive     TRIMETH/SULFA <=20 SENSITIVE Sensitive     AMPICILLIN/SULBACTAM >=32 RESISTANT Resistant     PIP/TAZO <=4 SENSITIVE Sensitive     * RARE MORGANELLA MORGANII   Methicillin resistant staphylococcus aureus - MIC*    CIPROFLOXACIN >=8 RESISTANT Resistant     ERYTHROMYCIN >=8 RESISTANT Resistant     GENTAMICIN <=0.5 SENSITIVE Sensitive     OXACILLIN >=4 RESISTANT Resistant     TETRACYCLINE <=1 SENSITIVE Sensitive     VANCOMYCIN <=0.5 SENSITIVE Sensitive     TRIMETH/SULFA 80 RESISTANT Resistant     CLINDAMYCIN <=0.25 SENSITIVE Sensitive     RIFAMPIN <=0.5 SENSITIVE Sensitive     Inducible Clindamycin NEGATIVE Sensitive     * RARE METHICILLIN RESISTANT STAPHYLOCOCCUS AUREUS     Radiology Studies: CT HEAD WO CONTRAST (5MM)  Result Date: 01/30/2021 CLINICAL DATA:  Fall, facial trauma EXAM: CT HEAD WITHOUT CONTRAST CT MAXILLOFACIAL WITHOUT CONTRAST TECHNIQUE: Multidetector CT imaging of the head and maxillofacial structures were performed using the standard protocol without intravenous contrast. Multiplanar CT image reconstructions of the maxillofacial structures were also generated. COMPARISON:  09/20/2020 CT head FINDINGS: CT HEAD FINDINGS Brain: No evidence of acute infarction, hemorrhage, hydrocephalus, extra-axial collection or mass  lesion/mass effect. Vascular: No hyperdense vessels. Calcifications in the intracranial internal carotid and vertebral arteries. Skull: Normal. Negative for fracture or focal lesion. Other: None. CT MAXILLOFACIAL FINDINGS Osseous: No fracture or mandibular dislocation. No destructive process. Orbits: Negative. No traumatic or inflammatory finding. Sinuses: Clear. Soft tissues: No significant hematoma. IMPRESSION: 1. No acute intracranial process. 2. No acute facial bone fracture. Electronically Signed   By: Wiliam KeAlison  Vasan M.D.   On: 01/30/2021 20:47   DG Foot 2 Views Right  Result Date: 01/30/2021 CLINICAL DATA:  Postoperative status. EXAM: RIGHT FOOT - 2 VIEW COMPARISON:  January 22, 2021. FINDINGS: Status post surgical amputation of the distal portions of the fourth and fifth metatarsals as well as the phalanges of the fourth and fifth toes. No other bony abnormality is noted. Remaining joint spaces are unremarkable. IMPRESSION: Status post surgical amputation of distal portions of fourth and fifth metatarsals and phalanges of associated toes. Electronically Signed   By: Lupita RaiderJames  Green Jr M.D.   On: 01/30/2021 17:22   CT MAXILLOFACIAL WO CONTRAST  Result Date: 01/30/2021 CLINICAL DATA:  Fall, facial trauma EXAM: CT HEAD WITHOUT CONTRAST CT MAXILLOFACIAL WITHOUT CONTRAST TECHNIQUE: Multidetector CT imaging of the head and maxillofacial structures were performed using the standard protocol without intravenous contrast. Multiplanar CT image reconstructions of the maxillofacial structures were also generated. COMPARISON:  09/20/2020 CT head  FINDINGS: CT HEAD FINDINGS Brain: No evidence of acute infarction, hemorrhage, hydrocephalus, extra-axial collection or mass lesion/mass effect. Vascular: No hyperdense vessels. Calcifications in the intracranial internal carotid and vertebral arteries. Skull: Normal. Negative for fracture or focal lesion. Other: None. CT MAXILLOFACIAL FINDINGS Osseous: No fracture or mandibular  dislocation. No destructive process. Orbits: Negative. No traumatic or inflammatory finding. Sinuses: Clear. Soft tissues: No significant hematoma. IMPRESSION: 1. No acute intracranial process. 2. No acute facial bone fracture. Electronically Signed   By: Wiliam Ke M.D.   On: 01/30/2021 20:47     LOS: 10 days   Huey Bienenstock, MD [facility]  02/01/2021, 1:37 PM

## 2021-02-01 NOTE — Evaluation (Signed)
Occupational Therapy Evaluation Patient Details Name: [name] MRN: [id number] DOB: 24-Jul-1965 Today's Date: 02/01/2021    History of Present Illness Pt is a 55 y.o. male admitted 01/22/21 with worsening R toes necrosis. Workup for osteomyelitis and nonhealing wound of R toes. S/p RLE revascularization 8/16. S/p R foot ray amputation 8/17. PMH includes CAD (s/p CABG 12/2020), DM, HTN, PVD, anemia, ataxia.   Clinical Impression   Pt admitted for  procedure listed above. PTA pt reported that he was mod I since his CABG, ambulating with a RW. At this time, pt is demonstrating safety with mobility and mod I with all ADL's, using a RW. At this time, pt does not need OT services and will be discharged from OT. Acute OT will sign off.     Follow Up Recommendations  No OT follow up    Equipment Recommendations  None recommended by OT    Recommendations for Other Services       Precautions / Restrictions Precautions Precautions: Fall;Sternal Precaution Comments: Recent CABG 12/27/20 - be mindful of sternal precautions Required Braces or Orthoses: Other Brace Other Brace: R darco shoe Restrictions Weight Bearing Restrictions: Yes RLE Weight Bearing: Non weight bearing Other Position/Activity Restrictions: sternal precautions      Mobility Bed Mobility Overal bed mobility: Independent                  Transfers Overall transfer level: Modified independent Equipment used: Rolling walker (2 wheeled) Transfers: Sit to/from Stand Sit to Stand: Modified independent (Device/Increase time)              Balance Overall balance assessment: Mild deficits observed, not formally tested                                         ADL either performed or assessed with clinical judgement   ADL Overall ADL's : Modified independent;At baseline                                       General ADL Comments: Pt able to ambulate with RW, complete LB  dressing and LB bathing sitting and standing with no difficulties.     Vision Baseline Vision/History: Wears glasses Wears Glasses: Reading only Patient Visual Report: No change from baseline Vision Assessment?: No apparent visual deficits     Perception Perception Perception Tested?: No   Praxis Praxis Praxis tested?: Not tested    Pertinent Vitals/Pain Pain Assessment: 0-10 Pain Score: 8  Pain Location: R foot Pain Descriptors / Indicators: Discomfort;Sore Pain Intervention(s): Limited activity within patient's tolerance;Monitored during session;Repositioned     Hand Dominance Right   Extremity/Trunk Assessment Upper Extremity Assessment Upper Extremity Assessment: Overall WFL for tasks assessed   Lower Extremity Assessment Lower Extremity Assessment: Defer to PT evaluation   Cervical / Trunk Assessment Cervical / Trunk Assessment: Normal   Communication Communication Communication: No difficulties (slowed speech baseline)   Cognition Arousal/Alertness: Awake/alert Behavior During Therapy: WFL for tasks assessed/performed Overall Cognitive Status: Within Functional Limits for tasks assessed                                 General Comments: Pt answering questions and following simple commands appropriately; at times with increased time; suspect near  baseline cognition. Pleasant and motivated   General Comments  Reinforec educ re: precautions, darco shoe wear, edema control, positioning; pt reports hopeful for d/c home tomorrow    Exercises     Shoulder Instructions      Home Living Family/patient expects to be discharged to:: Private residence Living Arrangements: Spouse/significant other Available Help at Discharge: Family;Available 24 hours/day Type of Home: House Home Access: Level entry     Home Layout: Two level;Bed/bath upstairs Alternate Level Stairs-Number of Steps: Flight Alternate Level Stairs-Rails: Right Bathroom Shower/Tub:  Chief Strategy Officer: Standard Bathroom Accessibility: Yes How Accessible: Accessible via walker Home Equipment: Walker - 2 wheels;Tub bench;Bedside commode;Cane - single point          Prior Functioning/Environment Level of Independence: Needs assistance  Gait / Transfers Assistance Needed: Ambulatory with SPC or RW; has not been mobilizing as much since recent CABG (12/27/20); does not work ADL's / [employer] Assistance Needed: Reports wife "helps me with what I need" - pt uses tub bench for bathing with assist from wife; wife performs household tasks            OT Problem List: Decreased strength;Impaired balance (sitting and/or standing);Decreased cognition;Decreased safety awareness;Decreased activity tolerance;Decreased knowledge of use of DME or AE      OT Treatment/Interventions:      OT Goals(Current goals can be found in the care plan section) Acute Rehab OT Goals Patient Stated Goal: To go home OT Goal Formulation: All assessment and education complete, DC therapy Time For Goal Achievement: 02/01/21 Potential to Achieve Goals: Good  OT Frequency:     Barriers to D/C:            Co-evaluation              AM-PAC OT "6 Clicks" Daily Activity     Outcome Measure Help from another person eating meals?: None Help from another person taking care of personal grooming?: None Help from another person toileting, which includes using toliet, bedpan, or urinal?: None Help from another person bathing (including washing, rinsing, drying)?: None Help from another person to put on and taking off regular upper body clothing?: None Help from another person to put on and taking off regular lower body clothing?: None 6 Click Score: 24   End of Session Equipment Utilized During Treatment: Gait belt;Rolling walker Nurse Communication: Mobility status  Activity Tolerance: Patient tolerated treatment well Patient left: in bed;with call bell/phone within  reach;with bed alarm set  OT Visit Diagnosis: Unsteadiness on feet (R26.81);Other abnormalities of gait and mobility (R26.89);Muscle weakness (generalized) (M62.81)                Time: 3762-8315 OT Time Calculation (min): 23 min Charges:  OT General Charges $OT Visit: 1 Visit OT Evaluation $OT Eval Low Complexity: 1 Low OT Treatments $Self Care/Home Management : 8-22 mins  Vyctoria Dickman H., OTR/L Acute Rehabilitation  Nathalya Wolanski Elane Bing Plume 02/01/2021, 5:49 PM

## 2021-02-01 NOTE — Progress Notes (Signed)
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  Had some slight pain overnight.  Denies any fevers or chills.  He has no other concerns today.  Objective: AAO x3, NAD- wife at bedside; patient sitting in chair with foot elevated Incision clean, dry, intact.  Dressing was changed today.  Sutures are intact.  Slight surrounding erythema.  There is still some dusky changes on the incision but not significantly worsened compared to yesterday.  There is no ascending cellulitis.  Small mount of bloody drainage expressed there is no purulence.  There is no fluctuation crepitation. 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  Xeroform was applied followed by dry sterile dressing.  He will do daily dressing changes upon discharge.  I showed his wife today how to do this and she is comfortable changing the bandages at home.  He is weightbearing to the heel with Darco wedge shoe.  Keep the incision clean and dry.  Culture results are back. Dr. Randol Kern discussed with pharmacy and will be discharged with doxycycline, Cipro.  I would recommend 10 days of oral antibiotics.  Stable for discharge tomorrow with close follow-up as an outpatient.  He has an appointment scheduled for next week to see me.  Strict return precautions were discussed and I also discussed with his wife what to look out for upon discharge.  Ovid Curd, DPM

## 2021-02-01 NOTE — Progress Notes (Signed)
Inpatient Diabetes Program Recommendations  AACE/ADA: New Consensus Statement on Inpatient Glycemic Control (2015)  Target Ranges:  Prepandial:   less than 140 mg/dL      Peak postprandial:   less than 180 mg/dL (1-2 hours)      Critically ill patients:  140 - 180 mg/dL   Lab Results  Component Value Date   GLUCAP 199 (H) 02/01/2021   HGBA1C 9.9 (H) 12/26/2020    Review of Glycemic Control Results for [name], [name] (MRN [id number]) as of 02/01/2021 11:56  Ref. Range 01/31/2021 07:41 01/31/2021 11:19 01/31/2021 15:24 01/31/2021 21:35 02/01/2021 07:39 02/01/2021 11:18  Glucose-Capillary Latest Ref Range: 70 - 99 mg/dL 501 (H) 586 (H) 825 (H) 197 (H) 190 (H) 199 (H)   Inpatient Diabetes Program Recommendations:   Please consider: -Novolog 2 units tid meal coverage if patient eats 50% meals Secure chat sent to Dr. Randol Kern.  Thank you, [name]. [name], RN, MSN, CDE  Diabetes Coordinator Inpatient Glycemic Control Team Team Pager [phone] (8am-5pm) 02/01/2021 12:02 PM

## 2021-02-01 NOTE — Progress Notes (Signed)
Physical Therapy Treatment Patient Details Name: [name] MRN: [id number] DOB: 02-06-1966 Today's Date: 02/01/2021    History of Present Illness Pt is a 55 y.o. male admitted 01/22/21 with worsening R toes necrosis. Workup for osteomyelitis and nonhealing wound of R toes. S/p RLE revascularization 8/16. S/p R foot ray amputation 8/17. PMH includes CAD (s/p CABG 12/2020), DM, HTN, PVD, anemia, ataxia.   PT Comments    Pt progressing well with mobility; pleasant and motivated to participate. Today's session focused on continued transfer and gait training with RW, pt progressing to supervision for safety/lines; good ability to maintain WB through R heel in darco shoe. Pt hopeful for d/c home tomorrow. If to remain admitted, will continue to follow acutely.    Follow Up Recommendations  Home health PT;Supervision for mobility/OOB     Equipment Recommendations  None recommended by PT    Recommendations for Other Services       Precautions / Restrictions Precautions Precautions: Fall;Sternal Precaution Comments: Recent CABG 12/27/20 - be mindful of sternal precautions Required Braces or Orthoses: Other Brace Other Brace: R darco shoe Restrictions Weight Bearing Restrictions: Yes RLE Weight Bearing: Partial weight bearing RLE Partial Weight Bearing Percentage or Pounds: WB through heel in darco shoe    Mobility  Bed Mobility Overal bed mobility: Independent                  Transfers Overall transfer level: Needs assistance Equipment used: Rolling walker (2 wheeled) Transfers: Sit to/from Stand Sit to Stand: Supervision         General transfer comment: Cues for hand placement, no overt instability; supervision for safety/lines  Ambulation/Gait Ambulation/Gait assistance: Min guard;Supervision Gait Distance (Feet): 180 Feet Assistive device: Rolling walker (2 wheeled) Gait Pattern/deviations: Step-through pattern;Decreased stride length;Antalgic Gait velocity:  Decreased   General Gait Details: Slow, mostly steady gait with RW and initial min guard for balance, progressing to supervision for safety/lines; no overt instability or LOB; further distance limited secondary to fatigue. R foot darco shoe donned, pt with good ability to WB through heel in shoe   Stairs             Wheelchair Mobility    Modified Rankin (Stroke Patients Only)       Balance Overall balance assessment: Needs assistance Sitting-balance support: No upper extremity supported;Feet supported Sitting balance-Leahy Scale: Good     Standing balance support: Bilateral upper extremity supported;During functional activity Standing balance-Leahy Scale: Poor Standing balance comment: Reliant on UE support                            Cognition Arousal/Alertness: Awake/alert Behavior During Therapy: Flat affect Overall Cognitive Status: No family/caregiver present to determine baseline cognitive functioning                                 General Comments: Pt answering questions and following simple commands appropriately; at times with increased time; suspect near baseline cognition. Pleasant and motivated      Exercises      General Comments General comments (skin integrity, edema, etc.): Reinforec educ re: precautions, darco shoe wear, edema control, positioning; pt reports hopeful for d/c home tomorrow      Pertinent Vitals/Pain Pain Assessment: Faces Faces Pain Scale: Hurts little more Pain Location: R foot Pain Descriptors / Indicators: Discomfort;Sore Pain Intervention(s): Monitored during session    Home Living  Prior Function            PT Goals (current goals can now be found in the care plan section) Progress towards PT goals: Progressing toward goals    Frequency    Min 3X/week      PT Plan Current plan remains appropriate    Co-evaluation              AM-PAC PT "6  Clicks" Mobility   Outcome Measure  Help needed turning from your back to your side while in a flat bed without using bedrails?: None Help needed moving from lying on your back to sitting on the side of a flat bed without using bedrails?: None Help needed moving to and from a bed to a chair (including a wheelchair)?: A Little Help needed standing up from a chair using your arms (e.g., wheelchair or bedside chair)?: A Little Help needed to walk in hospital room?: A Little Help needed climbing 3-5 steps with a railing? : A Little 6 Click Score: 20    End of Session Equipment Utilized During Treatment: Gait belt Activity Tolerance: Patient tolerated treatment well Patient left: in chair;with call bell/phone within reach;with chair alarm set Nurse Communication: Mobility status PT Visit Diagnosis: Other abnormalities of gait and mobility (R26.89);Muscle weakness (generalized) (M62.81)     Time: 0802-2336 PT Time Calculation (min) (ACUTE ONLY): 27 min  Charges:  $Gait Training: 8-22 mins $Therapeutic Activity: 8-22 mins                     Ina Homes, PT, DPT Acute Rehabilitation Services  Pager [phone] Office [phone]  Malachy Chamber 02/01/2021, 1:18 PM

## 2021-02-01 NOTE — Discharge Instructions (Signed)
You can change the bandage daily. Clean with saline then apply a nonstick dressing (such as adaptic or xeroform) to the incision and then cover with 4x4, Kerlix, ACE bandage.   Wear darco wedge shoe when walking but you need to limit the amount of walking in order to help the incision heal.   You can call the office at [phone] ([facility]) with any questions or concerns.

## 2021-02-02 LAB — GLUCOSE, CAPILLARY: Glucose-Capillary: 131 mg/dL — ABNORMAL HIGH (ref 70–99)

## 2021-02-02 MED ORDER — HYDROCODONE-ACETAMINOPHEN 5-325 MG PO TABS: 1.0000 | ORAL_TABLET | Freq: Four times a day (QID) | ORAL | 0 refills | Status: DC | PRN

## 2021-02-02 MED ORDER — CIPROFLOXACIN HCL 500 MG PO TABS: 500.0000 mg | ORAL_TABLET | Freq: Two times a day (BID) | ORAL | 0 refills | Status: AC

## 2021-02-02 MED ORDER — DOXYCYCLINE HYCLATE 100 MG PO TABS: 100.0000 mg | ORAL_TABLET | Freq: Two times a day (BID) | ORAL | 0 refills | Status: AC

## 2021-02-02 MED ORDER — CLOPIDOGREL BISULFATE 75 MG PO TABS: 75.0000 mg | ORAL_TABLET | Freq: Every day | ORAL | 1 refills | Status: DC

## 2021-02-02 NOTE — Discharge Summary (Signed)
[facility]  Physician Discharge Summary   Patient ID: [name] MRN: [id number] DOB/AGE: 56-29-1967 56 y.o.  Admit date: 01/22/2021 Discharge date: 02/02/2021    PCP: Joice Lofts, NP  DISCHARGE DIAGNOSES:  Osteomyelitis and nonhealing wound of right foot toe Peripheral artery disease Coronary artery disease Diabetes mellitus type 2 with uncontrolled hyperglycemia Essential hypertension   RECOMMENDATIONS FOR OUTPATIENT FOLLOW UP: Outpatient follow-up with podiatry    Home Health: Home health PT Equipment/Devices: None  CODE STATUS: Full code  DISCHARGE CONDITION: fair  Diet recommendation: Modified carbohydrate  INITIAL HISTORY: 55 year old male with diabetes, hypertension and tobacco abuse, HLD, CAD status post CABG times 47/14/22 over the saphenous vein was harvested on his right leg and has developed infection of the wound on the side since then, followed by outpatient podiatry, wound was improving.  In the last week it has gotten worse with discoloration of the right fourth and fifth toes more ordered and worsening symptoms, last 24 hours prior to admission got worse.  Came to the ED work-up showed osteomyelitis and MRI podiatry was consulted and was admitted, PVD, status post revascularization by vascular surgery,S/P ray amputation  8/17,  patient had fall  8/18, CT head, maxillofacial with no fracture or acute findings.   Consultants -Podiatry -Vascular   Procedures status post aortogram with popliteal and tibial intervention 8/16 AMPUTATION RAY (Right ) 8/17    HOSPITAL COURSE:   Osteomyelitis and nonhealing wound on of toe of right foot in the setting of diabetes, PAD:  -treated with broad-spectrum antibiotic vancomycin and Zosyn -Seen by vascular surgery. s/p Aortogram, RLE runoff with DCB angioplasty of Right Popliteal artery and balloon angioplasty of right posterior tibial artery 8/16 by Dr. Myra Gianotti. Subsequently seen by Dr. Loreta Ave with  podiatry and underwent ray amputation Intraoperative culture growing MRSA and Morganella. Changed over to oral ciprofloxacin and doxycycline which is to be given for total of 10 days.   PAD -See above discussion.   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  Continue home regimen  Anxiety/depression mood is stable on BuSpar, Cymbalta and Topamax   Hyperlipidemia on Crestor   Orthostatic hypotension continue home midodrine   Anemia suspect in the setting of chronic disease.  Monitor hemoglobin   Severe protein calorie malnutrition Nutrition Problem: Severe Malnutrition Etiology: chronic illness (CAD s/p CABG)  Signs/Symptoms: severe fat depletion, severe muscle depletion, energy intake < or equal to 75% for > or equal to 1 month, percent weight loss  Interventions: Refer to RD note for recommendations  Patient stable this morning.  Okay for discharge home.   PERTINENT LABS:  The results of significant diagnostics from this hospitalization (including imaging, microbiology, ancillary and laboratory) are listed below for reference.    Microbiology: Recent Results (from the past 240 hour(s))  Surgical pcr screen     Status: None   Collection Time: 01/30/21  8:57 AM   Specimen: Nasal Mucosa; Nasal Swab  Result Value Ref Range Status   MRSA, PCR NEGATIVE NEGATIVE Final   Staphylococcus aureus NEGATIVE NEGATIVE Final    Comment: (NOTE) The Xpert SA Assay (FDA approved for NASAL specimens in patients 67 years of age and older), is one component of a comprehensive surveillance program. It is not intended to diagnose infection nor to guide or monitor treatment. Performed at [facility], [address], [place], [place] [address]   Aerobic/Anaerobic Culture w Gram  Stain (surgical/deep wound)     Status: None (Preliminary result)   Collection Time: 01/30/21  1:16 PM    Specimen: PATH Amputaion Arm/Leg; Tissue  Result Value Ref Range Status   Specimen Description WOUND RIGHT FOOT  Final   Special Requests PATIENT ON FOLLOWING ZOSYN  Final   Gram Stain   Final    RARE WBC PRESENT,BOTH PMN AND MONONUCLEAR NO ORGANISMS SEEN Performed at [facility], [address], [place], [place] [address]    Culture   Final    RARE METHICILLIN RESISTANT STAPHYLOCOCCUS AUREUS RARE MORGANELLA MORGANII NO ANAEROBES ISOLATED; CULTURE IN PROGRESS FOR 5 DAYS    Report Status PENDING  Incomplete   Organism ID, Bacteria METHICILLIN RESISTANT STAPHYLOCOCCUS AUREUS  Final   Organism ID, Bacteria MORGANELLA MORGANII  Final      Susceptibility   Morganella morganii - MIC*    AMPICILLIN >=32 RESISTANT Resistant     CEFAZOLIN >=64 RESISTANT Resistant     CEFTAZIDIME <=1 SENSITIVE Sensitive     CIPROFLOXACIN <=0.25 SENSITIVE Sensitive     GENTAMICIN <=1 SENSITIVE Sensitive     IMIPENEM 1 SENSITIVE Sensitive     TRIMETH/SULFA <=20 SENSITIVE Sensitive     AMPICILLIN/SULBACTAM >=32 RESISTANT Resistant     PIP/TAZO <=4 SENSITIVE Sensitive     * RARE MORGANELLA MORGANII   Methicillin resistant staphylococcus aureus - MIC*    CIPROFLOXACIN >=8 RESISTANT Resistant     ERYTHROMYCIN >=8 RESISTANT Resistant     GENTAMICIN <=0.5 SENSITIVE Sensitive     OXACILLIN >=4 RESISTANT Resistant     TETRACYCLINE <=1 SENSITIVE Sensitive     VANCOMYCIN <=0.5 SENSITIVE Sensitive     TRIMETH/SULFA 80 RESISTANT Resistant     CLINDAMYCIN <=0.25 SENSITIVE Sensitive     RIFAMPIN <=0.5 SENSITIVE Sensitive     Inducible Clindamycin NEGATIVE Sensitive     * RARE METHICILLIN RESISTANT STAPHYLOCOCCUS AUREUS     Labs:  COVID-19 Labs   Lab Results  Component Value Date   SARSCOV2NAA NEGATIVE 01/22/2021   SARSCOV2NAA NEGATIVE 12/24/2020      Basic Metabolic Panel: Recent Labs  Lab 01/29/21 0635 01/30/21 0145 01/31/21 0402 02/01/21 0136  NA 137 137 141 136  K 3.9 3.9 4.1 3.7  CL  103 105 104 104  CO2 GLUCOSE 213* 230* 198* 208*  BUN 21* 22* 17 21*  CREATININE 0.75 0.74 0.72 0.77  CALCIUM 9.7 9.4 9.9 9.2    CBC: Recent Labs  Lab 01/29/21 0635 01/30/21 0145 01/31/21 0402 02/01/21 0136  WBC 7.9 9.3 9.0 8.6  HGB 10.0* 10.1* 9.5* 8.9*  HCT 31.7* 31.5* 30.1* 28.5*  MCV 84.8 85.1 85.5 85.6  PLT 374 355 352 343     CBG: Recent Labs  Lab 01/31/21 2135 02/01/21 0739 02/01/21 1118 02/01/21 1652 02/02/21 0745  GLUCAP 197* 190* 199* 321* 131*     IMAGING STUDIES DG Chest 1 View  Result Date: 01/22/2021 CLINICAL DATA:  Diabetic.  Possible sepsis. EXAM: CHEST  1 VIEW COMPARISON:  None. FINDINGS: Prior CABG. Heart and mediastinal contours are within normal limits. No focal opacities or effusions. No acute bony abnormality. IMPRESSION: No active cardiopulmonary disease. Electronically Signed   By: Charlett Nose M.D.   On: 01/22/2021 17:56   CT HEAD WO CONTRAST ( )  Result Date: 01/30/2021 CLINICAL DATA:  Fall, facial trauma EXAM: CT HEAD WITHOUT CONTRAST CT MAXILLOFACIAL WITHOUT CONTRAST TECHNIQUE: Multidetector CT imaging of the head and maxillofacial structures were performed  using the standard protocol without intravenous contrast. Multiplanar CT image reconstructions of the maxillofacial structures were also generated. COMPARISON:  09/20/2020 CT head FINDINGS: CT HEAD FINDINGS Brain: No evidence of acute infarction, hemorrhage, hydrocephalus, extra-axial collection or mass lesion/mass effect. Vascular: No hyperdense vessels. Calcifications in the intracranial internal carotid and vertebral arteries. Skull: Normal. Negative for fracture or focal lesion. Other: None. CT MAXILLOFACIAL FINDINGS Osseous: No fracture or mandibular dislocation. No destructive process. Orbits: Negative. No traumatic or inflammatory finding. Sinuses: Clear. Soft tissues: No significant hematoma. IMPRESSION: 1. No acute intracranial process. 2. No acute facial bone fracture.  Electronically Signed   By: Wiliam Ke M.D.   On: 01/30/2021 20:47   MR FOOT RIGHT WO CONTRAST  Result Date: 01/23/2021 CLINICAL DATA:  Diabetic with foot swelling laterally. EXAM: MRI OF THE RIGHT FOREFOOT WITHOUT CONTRAST TECHNIQUE: Multiplanar, multisequence MR imaging of the right forefoot was performed. No intravenous contrast was administered. Osteomyelitis protocol MRI of the foot was obtained, to include the entire foot and ankle. This protocol uses a large field of view to cover the entire foot and ankle, and is suitable for assessing bony structures for osteomyelitis. Due to the large field of view and imaging plane choice, this protocol is less sensitive for assessing small structures such as ligamentous structures of the foot and ankle, compared to a dedicated forefoot or dedicated hindfoot exam. COMPARISON:  Radiographs 01/22/2021 FINDINGS: Bones/Joint/Cartilage Mildly accentuated T2 weighted signal in the phalanges of the fourth and fifth toes suspicious for early osteomyelitis. No overt bony destruction. Small degenerative foci of subcortical marrow edema in the navicular. Ligaments Lisfranc ligament appears intact. Muscles and Tendons Diffuse low-grade edema in the regional musculature is likely neurogenic. Soft tissues Ulceration and potentially tissue necrosis dorsally along the fourth and fifth toes. Thinning of the soft tissues in these from this region. Mild dorsal subcutaneous edema tracking back along the lateral ankle. Cellulitis not excluded. IMPRESSION: 1. Ulceration and thinning of soft tissues along the fourth and fifth toes, with subtle accentuated marrow edema signal in the fourth and fifth phalanges (especially the proximal phalanges) suspicious for early osteomyelitis. 2. Dorsal subcutaneous edema in the forefoot tracking back laterally along the ankle, potentially from cellulitis. Electronically Signed   By: Gaylyn Rong M.D.   On: 01/23/2021 05:17   PERIPHERAL  VASCULAR CATHETERIZATION  Result Date: 01/29/2021 Images from the original result were not included. Patient name: [name] MRN: [id number] DOB: January 01, 1966 Sex: male 01/29/2021 Pre-operative Diagnosis: Right toe ulcer Post-operative diagnosis:  Same Surgeon:  [name] Procedure Performed:  1.  Ultrasound-guided access, left femoral artery  2.  Abdominal aortogram  3.  Right lower extremity runoff  4.  Drug-coated balloon angioplasty, right popliteal artery  5.  Balloon angioplasty, right posterior tibial artery  6.  Intra-arterial administration of nitroglycerin  7.  Conscious sedation, 66 minutes  8.  Closure device, Celt Indications: This is a 55 year old gentleman with necrotic right fourth and fifth toes and abnormal Doppler studies he comes in today for further evaluation and possible invention Procedure:  The patient was identified in the holding area and taken to room 8.  The patient was then placed supine on the table and prepped and draped in the usual sterile fashion.  A time out was called.  Conscious sedation was administered with the use of IV fentanyl and Versed under continuous physician and nurse monitoring.  Heart rate, blood pressure, and oxygen saturation were continuously monitored.  Total sedation time was 66 minutes.  Ultrasound was used to evaluate the left common femoral artery.  It was patent .  A digital ultrasound image was acquired.  A micropuncture needle was used to access the left common femoral artery under ultrasound guidance.  An 018 wire was advanced without resistance and a micropuncture sheath was placed.  The 018 wire was removed and a benson wire was placed.  The micropuncture sheath was exchanged for a 5 french sheath.  An omniflush catheter was advanced over the wire to the level of L-1.  An abdominal angiogram was obtained.  Next, using the omniflush catheter and a benson wire, the aortic bifurcation was crossed and the catheter was placed into theright external  iliac artery and right runoff was obtained.  Findings:  Aortogram: No significant renal artery stenosis.  The infrarenal abdominal aorta is widely patent without significant stenosis.  Bilateral common and external iliac arteries are widely patent.  Right Lower Extremity: The right common femoral and profundofemoral artery are widely patent without stenosis.  The superficial femoral artery is widely patent.  There is a focal stenosis approximately 25 mm in length within the above-knee popliteal artery.  The below-knee popliteal artery is widely patent.  The dominant runoff is the anterior tibial.  There were several stenoses throughout the posterior tibial artery, greater than 70%  Left Lower Extremity: Not evaluated due to contrast utilization Intervention: After the above images were acquired the decision made to proceed with intervention.  Over an 035 wire, a 6 French 45 cm sheath was inserted.  The patient was fully heparinized.  I then used a 018 wire and a 3 x 100 Sterling balloon into the posterior tibial artery.  The wire was taken down below the ankle.  I treated the posterior tibial artery with primary balloon angioplasty using a 3 x 100 balloon.  Multiple overlapping inflations for 2 minutes were performed.  Follow-up imaging was performed.  The vessel appeared to be in spasm.  I removed the wire from the balloon and injected 300 mcg of nitroglycerin into the posterior tibial artery. Attention was then turned towards the popliteal lesion.  I selected a 4 x 40 drug-coated Ranger balloon and treated this area at nominal pressure for 3 minutes.  Completion imaging revealed resolution of the stenosis.  Runoff was then performed.  The patient now has much improved flow through the posterior tibial artery as well as the tear tibial artery which remains changed.  After results were obtained, I closed the groin with a Celt Impression:  #1  Greater than 80% right popliteal artery stenosis treated using a 4 x 40  drug-coated Ranger balloon  #2  Severe stenosis throughout the posterior tibial artery treated using a 3 mm balloon  #3  After intervention, the patient has an intact plantar arch.  The anterior tibial and posterior tibial arteries.  He should have adequate blood flow for amputation.  Juleen China, M.D., [facility] [place] Office: [phone] Pager:  [phone]   DG Foot 2 Views Right  Result Date: 01/30/2021 CLINICAL DATA:  Postoperative status. EXAM: RIGHT FOOT - 2 VIEW COMPARISON:  January 22, 2021. FINDINGS: Status post surgical amputation of the distal portions of the fourth and fifth metatarsals as well as the phalanges of the fourth and fifth toes. No other bony abnormality is noted. Remaining joint spaces are unremarkable. IMPRESSION: Status post surgical amputation of distal portions of fourth and fifth metatarsals and phalanges of associated toes. Electronically Signed   By:  Lupita RaiderJames  Green Jr M.D.   On: 01/30/2021 17:22   DG Foot Complete Right  Result Date: 01/22/2021 CLINICAL DATA:  Questionable sepsis. Diabetic. Open wounds over 4th and 5th toes. EXAM: RIGHT FOOT COMPLETE - 3+ VIEW COMPARISON:  01/07/2021 FINDINGS: There is no evidence of fracture or dislocation. There is no evidence of arthropathy or other focal bone abnormality. Soft tissues are unremarkable. No evidence of bone destruction to suggest osteomyelitis. IMPRESSION: Negative. Electronically Signed   By: Charlett NoseKevin  Dover M.D.   On: 01/22/2021 17:56   DG Foot Complete Right  Result Date: 01/08/2021 Please see detailed radiograph report in office note.  VAS US ABI WITH/WO TBI  Result Date: 01/26/2021  LOWER EXTREMITY DOPPLER STUDY Patient Name:  Benjamin SilenceBOBBY Stark  Date of Exam:   01/25/2021 Medical Rec #: [id number]       Accession #:    [id number] Date of Birth: 08-14-1965      Patient Gender: M Patient Age:   [age] years [facility] Procedure:      VAS US ABI WITH/WO TBI Referring  Phys: [facility] --------------------------------------------------------------------------------  Indications: Gangrene. High Risk         Hypertension, hyperlipidemia, Diabetes, coronary artery Factors:          disease.  Comparison Study: 12/24/20 prior Performing Technologist: [place] PonderMegan Stricklin RVS  Examination Guidelines: A complete evaluation includes at minimum, Doppler waveform signals and systolic blood pressure reading at the level of bilateral brachial, anterior tibial, and posterior tibial arteries, when vessel segments are accessible. Bilateral testing is considered an integral part of a complete examination. Photoelectric Plethysmograph (PPG) waveforms and toe systolic pressure readings are included as required and additional duplex testing as needed. Limited examinations for reoccurring indications may be performed as noted.  ABI Findings: +---------+------------------+-----+-------------------+--------+ Right    Rt Pressure (mmHg)IndexWaveform           Comment  +---------+------------------+-----+-------------------+--------+ Brachial 107                    triphasic                   +---------+------------------+-----+-------------------+--------+ PTA                             absent                      +---------+------------------+-----+-------------------+--------+ DP       66                0.61 dampened monophasic         +---------+------------------+-----+-------------------+--------+ Great Toe9                 0.08 Abnormal                    +---------+------------------+-----+-------------------+--------+ +---------+------------------+-----+----------+-------+ Left     Lt Pressure (mmHg)IndexWaveform  Comment +---------+------------------+-----+----------+-------+ Brachial 109                    triphasic         +---------+------------------+-----+----------+-------+ PTA      113               1.04 monophasic         +---------+------------------+-----+----------+-------+ DP       107               0.98 monophasic        +---------+------------------+-----+----------+-------+  Great Toe93                0.85 Normal            +---------+------------------+-----+----------+-------+ +-------+-----------+-----------+------------+------------+ ABI/TBIToday's ABIToday's TBIPrevious ABIPrevious TBI +-------+-----------+-----------+------------+------------+ Right  0.61       0.06                                +-------+-----------+-----------+------------+------------+ Left   1.04       0.85                                +-------+-----------+-----------+------------+------------+  Arterial wall calcification precludes accurate ankle pressures and ABIs.  Summary: Right: Resting right ankle-brachial index indicates moderate right lower extremity arterial disease. The right toe-brachial index is abnormal. Left: Resting left ankle-brachial index is within normal range. No evidence of significant left lower extremity arterial disease.  *See table(s) above for measurements and observations.  Electronically signed by Coral Else MD on 01/26/2021 at 1:25:16 PM.    Final    CT MAXILLOFACIAL WO CONTRAST  Result Date: 01/30/2021 CLINICAL DATA:  Fall, facial trauma EXAM: CT HEAD WITHOUT CONTRAST CT MAXILLOFACIAL WITHOUT CONTRAST TECHNIQUE: Multidetector CT imaging of the head and maxillofacial structures were performed using the standard protocol without intravenous contrast. Multiplanar CT image reconstructions of the maxillofacial structures were also generated. COMPARISON:  09/20/2020 CT head FINDINGS: CT HEAD FINDINGS Brain: No evidence of acute infarction, hemorrhage, hydrocephalus, extra-axial collection or mass lesion/mass effect. Vascular: No hyperdense vessels. Calcifications in the intracranial internal carotid and vertebral arteries. Skull: Normal. Negative for fracture or focal lesion. Other: None.  CT MAXILLOFACIAL FINDINGS Osseous: No fracture or mandibular dislocation. No destructive process. Orbits: Negative. No traumatic or inflammatory finding. Sinuses: Clear. Soft tissues: No significant hematoma. IMPRESSION: 1. No acute intracranial process. 2. No acute facial bone fracture. Electronically Signed   By: Wiliam Ke M.D.   On: 01/30/2021 20:47    DISCHARGE EXAMINATION: Vitals:   02/01/21 1459 02/01/21 2214 02/02/21 0629 02/02/21 0905  BP: 130/73 111/66 93/63 97/64   Pulse: 75 80 88 74  Resp: 19 13 13 12   Temp: (!) 97.3 F (36.3 C) 98.2 F (36.8 C) 98.1 F (36.7 C) 98 F (36.7 C)  TempSrc: Oral Oral Oral Oral  SpO2: 100% 100% 100% 100%  Weight:      Height:       General appearance: Awake alert.  In no distress Resp: Clear to auscultation bilaterally.  Normal effort Cardio: S1-S2 is normal regular.  No S3-S4.  No rubs murmurs or bruit GI: Abdomen is soft.  Nontender nondistended.  Bowel sounds are present normal.  No masses organomegaly    DISPOSITION: Home  Discharge Instructions     Call MD for:  difficulty breathing, headache or visual disturbances   Complete by: As directed    Call MD for:  extreme fatigue   Complete by: As directed    Call MD for:  persistant dizziness or light-headedness   Complete by: As directed    Call MD for:  persistant nausea and vomiting   Complete by: As directed    Call MD for:  redness, tenderness, or signs of infection (pain, swelling, redness, odor or green/yellow discharge around incision site)   Complete by: As directed    Call MD for:  severe uncontrolled pain   Complete by: As directed  Call MD for:  temperature >100.4   Complete by: As directed    Diet - low sodium heart healthy   Complete by: As directed    Diet Carb Modified   Complete by: As directed    Discharge instructions   Complete by: As directed    Please take your medications as prescribed.  Follow-up with the podiatrist in a few days.  Call his office on  Monday for appointment.  Follow-up with the vascular doctor in 1 month.  Seek attention if you notice any bleeding from her surgical site.  Also seek attention if you develop fever chills nausea vomiting worsening pain.  You were cared for by a hospitalist during your hospital stay. If you have any questions about your discharge medications or the care you received while you were in the hospital after you are discharged, you can call the unit and asked to speak with the hospitalist on call if the hospitalist that took care of you is not available. Once you are discharged, your primary care physician will handle any further medical issues. Please note that NO REFILLS for any discharge medications will be authorized once you are discharged, as it is imperative that you return to your primary care physician (or establish a relationship with a primary care physician if you do not have one) for your aftercare needs so that they can reassess your need for medications and monitor your lab values. If you do not have a primary care physician, you can call [phone] for a physician referral.   Discharge wound care:   Complete by: As directed    As instructed by podiatry   Increase activity slowly   Complete by: As directed          Allergies as of 02/02/2021       Reactions   Atorvastatin Other (See Comments)   Myalgias         Medication List     TAKE these medications    Accu-Chek Softclix Lancets lancets SMARTSIG:Topical   acetaminophen 500 MG tablet Commonly known as: TYLENOL Take 1,000 mg by mouth every 6 (six) hours as needed for moderate pain or headache.   aspirin 81 MG chewable tablet Chew 1 tablet (81 mg total) by mouth daily.   Basaglar KwikPen 100 UNIT/ML Inject 10 Units into the skin in the morning.   busPIRone 10 MG tablet Commonly known as: BUSPAR Take 10 mg by mouth 2 (two) times daily.   ciprofloxacin 500 MG tablet Commonly known as: CIPRO Take 1 tablet (500 mg  total) by mouth 2 (two) times daily for 10 days.   clopidogrel 75 MG tablet Commonly known as: PLAVIX Take 1 tablet (75 mg total) by mouth daily with breakfast.   Cymbalta 60 MG capsule Generic drug: DULoxetine Take 60 mg by mouth in the morning.   diphenhydrAMINE 25 MG tablet Commonly known as: BENADRYL Take 25 mg by mouth in the morning.   doxycycline 100 MG tablet Commonly known as: VIBRA-TABS Take 1 tablet (100 mg total) by mouth every 12 (twelve) hours for 10 days.   ferrous sulfate 325 (65 FE) MG tablet Take 325 mg by mouth daily with breakfast.   gabapentin 300 MG capsule Commonly known as: NEURONTIN Take 300 mg by mouth 3 (three) times daily.   glipiZIDE 10 MG tablet Commonly known as: GLUCOTROL Take 10 mg by mouth 2 (two) times daily.   HYDROcodone-acetaminophen 5-325 MG tablet Commonly known as: NORCO/VICODIN Take 1 tablet by mouth  every 6 (six) hours as needed for moderate pain.   metFORMIN 1000 MG tablet Commonly known as: GLUCOPHAGE Take 500 mg by mouth 2 (two) times daily.   metoprolol tartrate 25 MG tablet Commonly known as: LOPRESSOR Take 0.5 tablets (12.5 mg total) by mouth 2 (two) times daily.   midodrine 10 MG tablet Commonly known as: PROAMATINE Take 1 tablet (10 mg total) by mouth 3 (three) times daily with meals.   omeprazole 40 MG capsule Commonly known as: PRILOSEC Take 40 mg by mouth in the morning.   rosuvastatin 20 MG tablet Commonly known as: CRESTOR Take 1 tablet (20 mg total) by mouth daily.   topiramate 50 MG tablet Commonly known as: Topamax Take 1 tablet (50 mg total) by mouth 2 (two) times daily.   Vitamin D (Ergocalciferol) 1.25 MG (50000 UNIT) Caps capsule Commonly known as: DRISDOL Take 50,000 Units by mouth every Friday.               Discharge Care Instructions  (From admission, onward)           Start     Ordered   02/02/21 0000  Discharge wound care:       Comments: As instructed by podiatry    02/02/21 0930              Follow-up Information     Vivi Barrack, DPM Follow up in 3 day(s).   Specialty: Podiatry Contact information: [address] [place] [address] [place] [address] [phone]         Nada Libman, MD. Schedule an appointment as soon as possible for a visit in 1 month(s).   Specialties: Vascular Surgery, Cardiology Contact information: [address] [place] [address] [phone]                 TOTAL DISCHARGE TIME: 35 minutes  Alexsandra Shontz Rito Ehrlich  [facility] Pager on [email or web address]  02/03/2021, 1:41 PM

## 2021-02-02 NOTE — Progress Notes (Signed)
Subjective: POD #3 s/p right partial fourth and fifth ray amputations.  States he is feeling well this morning ready to go home.  No fevers or chills.  No pain to his foot.  Objective: AAO x3, NAD- patients wife on speaker phone today. Incision clean, dry, intact.  Dressing was changed today.  Sutures are intact.  No significant cellulitis identified there is no drainage or pus coming from incision.  Still some slight dusky change along the incision but is not worsened the incision appears to be healing appropriately right now.  No obvious signs of infection. No pain with calf compression, swelling, warmth, erythema  Assessment: POD #3 s/p right partial fourth and fifth ray amputations  Plan: Dressing changed.  Plan for discharge today with oral antibiotics, doxycycline and cipro. WBAT to heel in Darco wedge shoe. Has follow up scheduled for Thursday.  Return precautions discussed.  Ovid Curd, DPM

## 2021-02-04 LAB — AEROBIC/ANAEROBIC CULTURE W GRAM STAIN (SURGICAL/DEEP WOUND)

## 2021-02-04 LAB — GLUCOSE, CAPILLARY: Glucose-Capillary: 266 mg/dL — ABNORMAL HIGH (ref 70–99)

## 2021-02-07 ENCOUNTER — Ambulatory Visit: Payer: Medicaid Other

## 2021-02-07 ENCOUNTER — Other Ambulatory Visit: Payer: Self-pay

## 2021-02-07 ENCOUNTER — Ambulatory Visit (INDEPENDENT_AMBULATORY_CARE_PROVIDER_SITE_OTHER): Payer: Medicaid Other | Admitting: Podiatry

## 2021-02-07 DIAGNOSIS — L97511 Non-pressure chronic ulcer of other part of right foot limited to breakdown of skin: Secondary | ICD-10-CM

## 2021-02-08 ENCOUNTER — Encounter: Payer: Medicaid Other | Admitting: Thoracic Surgery (Cardiothoracic Vascular Surgery)

## 2021-02-08 NOTE — Progress Notes (Signed)
Subjective: [name] is a 55 y.o. is seen today in office s/p right partial fourth, fifth ray amputations preformed on 01/30/2021.  Please see sodas get some discomfort and sticking pain medication for this.  Otherwise has been doing well.  Pain is in the Darco wedge shoe as well as a walker to avoid pressure.  His wife has been changing the bandage and asking for dressing supplies.  Denies any systemic complaints such as fevers, chills, nausea, vomiting. No calf pain, chest pain, shortness of breath.   Objective: General: No acute distress, AAOx3  DP/PT pulses palpable, CRT < 3 sec to all remaining digits.  Right foot: Incision is well coapted without any evidence of dehiscence. There is a scab, dried blood present around the suture line.  There is no significant surrounding erythema, ascending cellulitis there is no drainage or pus.  No obvious signs of infection.  No other open lesions or pre-ulcerative lesions.  No pain with calf compression, swelling, warmth, erythema.   Assessment and Plan:  Status post right partial fourth, fifth ray amputation, doing well with no complications   -Treatment options discussed including all alternatives, risks, and complications -Dressing was changed today.  A small amount of Betadine was applied followed by Adaptic and a dry sterile dressing.  I will order dressing supplies for them through [facility] today.  I have ordered 4 x 4's kerlix,  Xeroform. -Continue Darco wedge shoe to limit weightbearing. -Elevation -Pain medication as needed-if he needs a refill he will let us know. -Monitor for any clinical signs or symptoms of infection and DVT/PE and directed to call the office immediately should any occur or go to the ER. -Follow-up as scheduled or sooner if any problems arise. In the meantime, encouraged to call the office with any questions, concerns, change in symptoms.   Ovid Curd, DPM

## 2021-02-11 ENCOUNTER — Other Ambulatory Visit: Payer: Self-pay | Admitting: Podiatry

## 2021-02-11 ENCOUNTER — Telehealth: Payer: Self-pay | Admitting: *Deleted

## 2021-02-11 MED ORDER — HYDROCODONE-ACETAMINOPHEN 5-325 MG PO TABS: 1.0000 | ORAL_TABLET | Freq: Four times a day (QID) | ORAL | 0 refills | Status: DC | PRN

## 2021-02-11 NOTE — Telephone Encounter (Signed)
Patient's wife is calling for pain medicine refill (Hydrocodone-5 -325 mg). Please advise or send to pharmacy  on file.

## 2021-02-11 NOTE — Telephone Encounter (Signed)
Returned call to patient, informed that pain medicine has been approved and sent to pharmacy on file,verbalized understanding.

## 2021-02-14 ENCOUNTER — Ambulatory Visit (INDEPENDENT_AMBULATORY_CARE_PROVIDER_SITE_OTHER): Payer: Medicaid Other | Admitting: Podiatry

## 2021-02-14 ENCOUNTER — Other Ambulatory Visit: Payer: Self-pay

## 2021-02-14 ENCOUNTER — Other Ambulatory Visit: Payer: Self-pay | Admitting: Thoracic Surgery (Cardiothoracic Vascular Surgery)

## 2021-02-14 DIAGNOSIS — L97511 Non-pressure chronic ulcer of other part of right foot limited to breakdown of skin: Secondary | ICD-10-CM

## 2021-02-14 DIAGNOSIS — Z951 Presence of aortocoronary bypass graft: Secondary | ICD-10-CM

## 2021-02-15 ENCOUNTER — Encounter: Payer: Self-pay | Admitting: Thoracic Surgery (Cardiothoracic Vascular Surgery)

## 2021-02-15 ENCOUNTER — Ambulatory Visit
Admission: RE | Admit: 2021-02-15 | Discharge: 2021-02-15 | Disposition: A | Discharge status: Home / Self Care | Payer: Medicaid Other | Source: Ambulatory Visit | Attending: Thoracic Surgery (Cardiothoracic Vascular Surgery) | Admitting: Thoracic Surgery (Cardiothoracic Vascular Surgery)

## 2021-02-15 ENCOUNTER — Ambulatory Visit (INDEPENDENT_AMBULATORY_CARE_PROVIDER_SITE_OTHER): Payer: Self-pay | Admitting: Thoracic Surgery (Cardiothoracic Vascular Surgery)

## 2021-02-15 VITALS — BP 98/64 | HR 78 | Resp 20 | Ht 67.0 in | Wt 124.0 lb

## 2021-02-15 DIAGNOSIS — Z951 Presence of aortocoronary bypass graft: Secondary | ICD-10-CM

## 2021-02-15 IMAGING — DX DG CHEST 2V
2 series · 2 of 2 positions shown · non-contrast
Comparison: [DATE]

CLINICAL DATA: Status post CABG.

EXAM:
CHEST - 2 VIEW

[dg chest 2 view (1 of 2)]
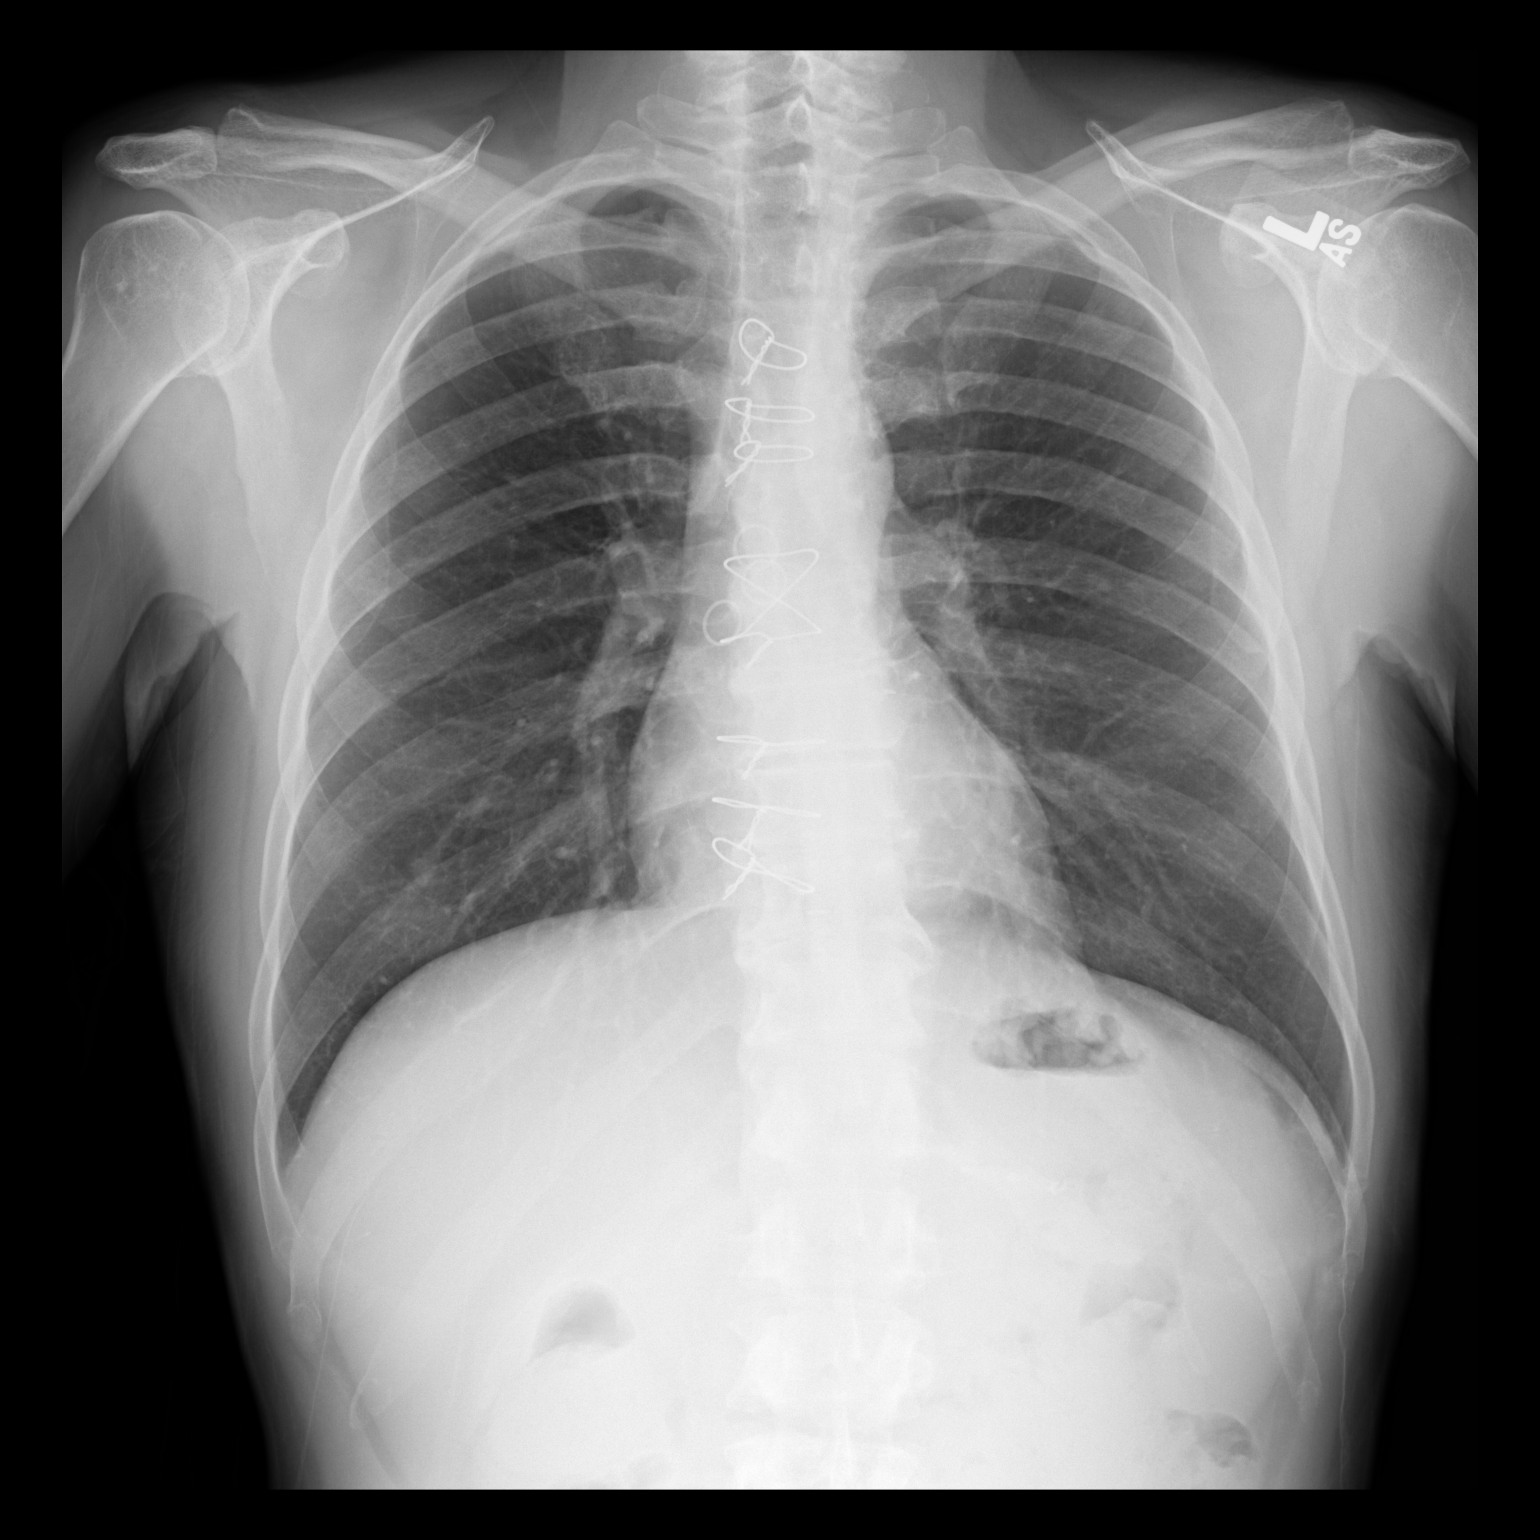

[dg chest 2 view (2 of 2)]
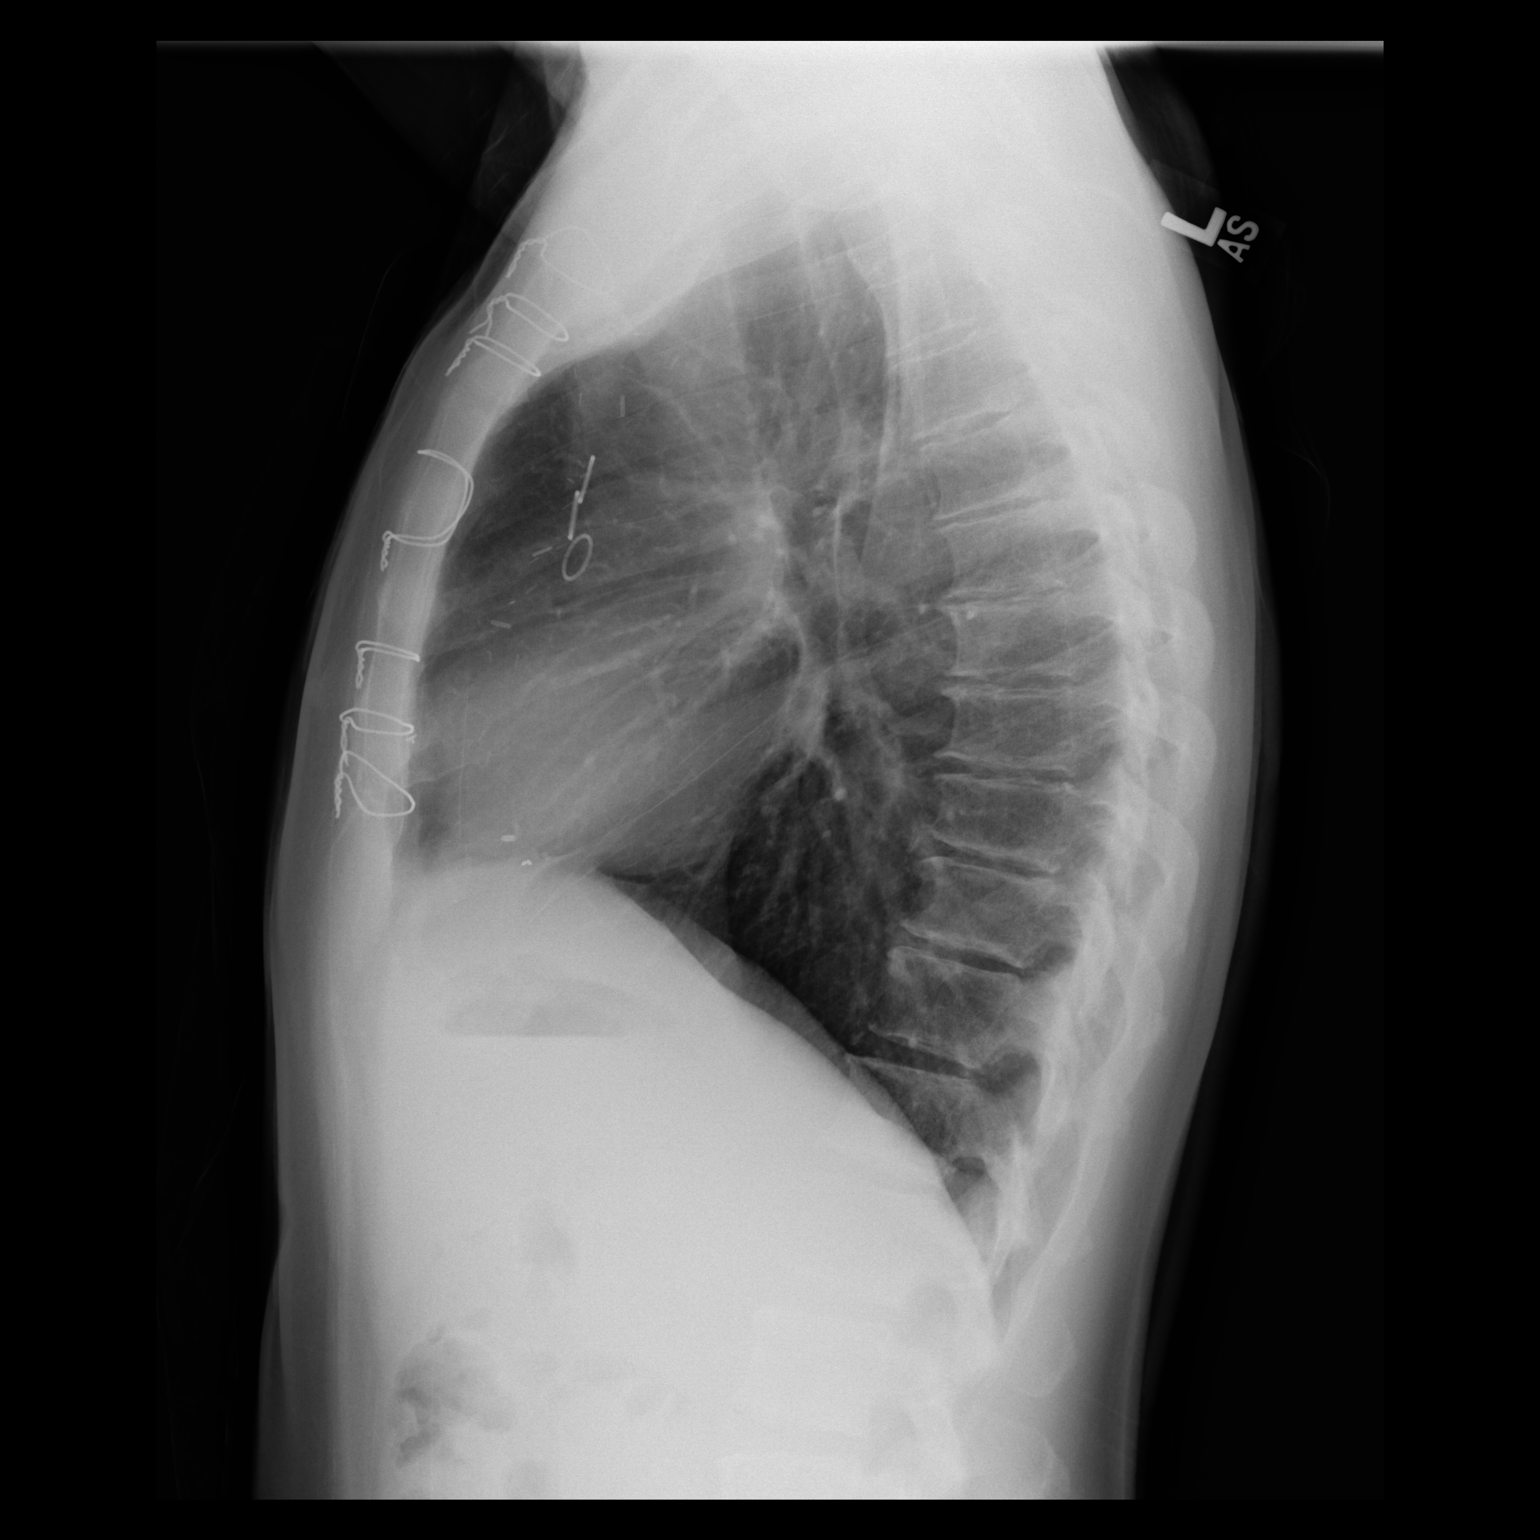

[2 of 2 positions shown; findings below may reference images not displayed]

FINDINGS: The heart size and mediastinal contours are within normal limits.
Both lungs are clear. The visualized skeletal structures are
unremarkable.
IMPRESSION: No active cardiopulmonary disease.

## 2021-02-15 NOTE — Progress Notes (Signed)
      [address]       [place] [address]             [phone]        Rocket Gunderson [facility] Medical Record #[id number] Date of Birth: 10-Nov-1965  Referring: Kathleene Hazel* Primary Care: Joice Lofts, NP Primary Cardiologist:Heather Becky Sax, MD  Reason for visit:   follow-up  History of Present Illness:     [name] comes in for his 1 month follow-up appointment.  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  He underwent catheterization but ultimately had to have 2 toes removed.  From a cardiac standpoint he denies any chest pain or shortness of breath.  He is not been able to move much due to his lower extremity issue.  Physical Exam: BP 98/64   Pulse 78   Resp 20   Ht 5\' 7"  (1.702 m)   Wt 124 lb (56.2 kg)   SpO2 98% Comment: RA  BMI 19.42 kg/m   Alert NAD Incision clean.  Sternum stable Abdomen soft, ND No peripheral edema   Diagnostic Studies & Laboratory data: CXR: Clear     Assessment / Plan:   55 year old male status post coronary artery bypass grafting.  He subsequently underwent toe amputations on the right foot.  He remains on the midodrine for low blood pressure.  From a cardiac standpoint he is clear for normal activity as well as cardiac rehab. Follow-up with vascular surgery in 3 weeks Patient will follow up with cardiology for further medication management. Follow-up with me as needed.   53 02/15/2021 11:40 AM

## 2021-02-20 ENCOUNTER — Encounter: Payer: Self-pay | Admitting: *Deleted

## 2021-02-20 NOTE — Progress Notes (Signed)
Subjective: [name] is a 55 y.o. is seen today in office s/p right partial fourth, fifth ray amputations preformed on 01/30/2021.  His wife has been changing the bandage but apparently the patient is hesitant for her to do so.  Overall states he is doing a bit better.  Currently denies any fevers or chills.  The pain is not as much.  He has no new concerns.   Objective: General: No acute distress, AAOx3  DP/PT pulses palpable, CRT < 3 sec to all remaining digits.  Right foot: Incision is well coapted without any evidence of dehiscence.  There are still some scab present on the incision.  Remove a couple of the sutures today however left the majority intact.  There is mild edema there is no erythema.  There is no fluctuation crepitation but there is no malodor.  No other open lesions or pre-ulcerative lesions.  No pain with calf compression, swelling, warmth, erythema.   Assessment and Plan:  Status post right partial fourth, fifth ray amputation, doing well with no complications   -Treatment options discussed including all alternatives, risks, and complications -I remove the few of the sutures today.  I did remove some of the scab and make sure the underlying skin was intact and healing appropriately.  I would recommend daily dressing changes.  Continue a small mount of Betadine for now, order dressing with Hydrofera Blue to apply to the incision as well.  Continuing Darco wedge shoe, elevation. -Monitor for any clinical signs or symptoms of infection and directed to call the office immediately should any occur or go to the ER.  Return in about 1 week (around 02/21/2021).  Vivi Barrack DPM

## 2021-02-21 ENCOUNTER — Encounter: Payer: Self-pay | Admitting: Podiatry

## 2021-02-21 ENCOUNTER — Ambulatory Visit (INDEPENDENT_AMBULATORY_CARE_PROVIDER_SITE_OTHER): Payer: Medicaid Other | Admitting: Podiatry

## 2021-02-21 ENCOUNTER — Other Ambulatory Visit: Payer: Self-pay

## 2021-02-21 DIAGNOSIS — I739 Peripheral vascular disease, unspecified: Secondary | ICD-10-CM

## 2021-02-21 DIAGNOSIS — L97511 Non-pressure chronic ulcer of other part of right foot limited to breakdown of skin: Secondary | ICD-10-CM

## 2021-02-21 DIAGNOSIS — Z9889 Other specified postprocedural states: Secondary | ICD-10-CM

## 2021-02-21 MED ORDER — DOXYCYCLINE HYCLATE 100 MG PO TABS: 100.0000 mg | ORAL_TABLET | Freq: Two times a day (BID) | ORAL | 0 refills | Status: DC

## 2021-02-25 ENCOUNTER — Other Ambulatory Visit: Payer: Self-pay

## 2021-02-25 DIAGNOSIS — I25110 Atherosclerotic heart disease of native coronary artery with unstable angina pectoris: Secondary | ICD-10-CM

## 2021-02-26 ENCOUNTER — Other Ambulatory Visit: Payer: Self-pay | Admitting: Podiatry

## 2021-02-26 ENCOUNTER — Telehealth: Payer: Self-pay | Admitting: *Deleted

## 2021-02-26 MED ORDER — HYDROCODONE-ACETAMINOPHEN 5-325 MG PO TABS: 1.0000 | ORAL_TABLET | Freq: Four times a day (QID) | ORAL | 0 refills | Status: DC | PRN

## 2021-02-26 NOTE — Telephone Encounter (Signed)
Patient's wife is calling to request a pain medicine refill of the (Hydrocodone-ace,5-325 mg).Please advise.

## 2021-02-27 NOTE — Progress Notes (Signed)
Subjective: [name] is a 55 y.o. is seen today in office s/p right partial fourth, fifth ray amputations preformed on 01/30/2021.  His wife has been changing the bandage.  Patient's wife states that it was looking better but today she is worried about healing.  His pain has improved.  Denies any fevers or chills.  He has no other concerns today.   Objective: General: No acute distress, AAOx3  DP/PT pulses palpable, CRT < 3 sec to all remaining digits.  Right foot: Incision is well coapted without any evidence of dehiscence.  On the central portion of the incision there is some probing noted to the wound there is no probing to bone.  There is no purulence identified.  There are some fibrotic tissue present along the periphery of the incision with a mild rim of mild erythema with still some scab/necrotic tissue.  There is no fluctuance or crepitation.  No ascending cellulitis.  There is no significant increased warmth of foot.  No drainage or pus.  There is no malodor. No other open lesions or pre-ulcerative lesions.  No pain with calf compression, swelling, warmth, erythema.      Assessment and Plan:  Status post right partial fourth, fifth ray amputation  -Treatment options discussed including all alternatives, risks, and complications -I left the sutures intact today. Continue daily dressing changes.  On the picture above there is no drainage there is some fibrotic tissue noted along the incision as well as mild erythema.  Given the erythema we will restart doxycycline.  Continue with dressing changes and Darco wedge shoe.  Continue to elevate the foot as well.  Monitor closely for signs or symptoms of worsening infection report emergency department should any occur.  Pain medication as needed.  Vivi Barrack DPM  -I remove the few of the sutures today.  I did remove some of the scab and make sure the underlying skin was intact and healing appropriately.  I would recommend daily  dressing changes.  Continue a small mount of Betadine for now, order dressing with Hydrofera Blue to apply to the incision as well.  Continuing Darco wedge shoe, elevation. -Monitor for any clinical signs or symptoms of infection and directed to call the office immediately should any occur or go to the ER.  Return in about 1 week (around 02/21/2021).  Vivi Barrack DPM

## 2021-02-28 ENCOUNTER — Other Ambulatory Visit: Payer: Self-pay

## 2021-02-28 ENCOUNTER — Ambulatory Visit (INDEPENDENT_AMBULATORY_CARE_PROVIDER_SITE_OTHER): Payer: Medicaid Other | Admitting: Podiatry

## 2021-02-28 DIAGNOSIS — I739 Peripheral vascular disease, unspecified: Secondary | ICD-10-CM

## 2021-02-28 DIAGNOSIS — L97511 Non-pressure chronic ulcer of other part of right foot limited to breakdown of skin: Secondary | ICD-10-CM

## 2021-02-28 DIAGNOSIS — L97512 Non-pressure chronic ulcer of other part of right foot with fat layer exposed: Secondary | ICD-10-CM

## 2021-03-03 LAB — WOUND CULTURE
MICRO NUMBER:: 12379188
SPECIMEN QUALITY:: ADEQUATE

## 2021-03-04 ENCOUNTER — Telehealth: Payer: Self-pay | Admitting: *Deleted

## 2021-03-04 ENCOUNTER — Ambulatory Visit (INDEPENDENT_AMBULATORY_CARE_PROVIDER_SITE_OTHER): Payer: Medicaid Other | Admitting: Physician Assistant

## 2021-03-04 ENCOUNTER — Ambulatory Visit (HOSPITAL_COMMUNITY)
Admission: RE | Admit: 2021-03-04 | Discharge: 2021-03-04 | Disposition: A | Discharge status: Home / Self Care | Payer: Medicaid Other | Source: Ambulatory Visit | Attending: Surgery | Admitting: Surgery

## 2021-03-04 ENCOUNTER — Other Ambulatory Visit: Payer: Self-pay

## 2021-03-04 ENCOUNTER — Other Ambulatory Visit: Payer: Self-pay | Admitting: Podiatry

## 2021-03-04 ENCOUNTER — Ambulatory Visit (INDEPENDENT_AMBULATORY_CARE_PROVIDER_SITE_OTHER)
Admit: 2021-03-04 | Discharge: 2021-03-04 | Disposition: A | Discharge status: Home / Self Care | Payer: Medicaid Other | Attending: Surgery | Admitting: Surgery

## 2021-03-04 VITALS — BP 98/63 | HR 86 | Temp 98.1°F | Resp 14 | Ht 67.0 in | Wt 124.0 lb

## 2021-03-04 DIAGNOSIS — I25110 Atherosclerotic heart disease of native coronary artery with unstable angina pectoris: Secondary | ICD-10-CM

## 2021-03-04 DIAGNOSIS — I739 Peripheral vascular disease, unspecified: Secondary | ICD-10-CM | POA: Diagnosis not present

## 2021-03-04 MED ORDER — DOXYCYCLINE HYCLATE 100 MG PO TABS: 100.0000 mg | ORAL_TABLET | Freq: Two times a day (BID) | ORAL | 0 refills | Status: DC

## 2021-03-04 NOTE — Progress Notes (Signed)
Subjective: Angela Platner is a 55 y.o. is seen today in office s/p right partial fourth, fifth ray amputations preformed on 01/30/2021.  His wife states that since he was last seen in the office there is minimal drainage noted from the wound.  Patient still having some pain but is not worsening.  Denies any fevers or chills.  No other concerns.  Objective: General: No acute distress, AAOx3  DP/PT pulses palpable Right foot: Today the incision is still coapted on the proximal distal portions of the central aspect there is not have significant healing and there is serous drainage expressed.  There is no fluctuance or crepitation.  There is mild rim of erythema without any ascending cellulitis.  No fluctuance or crepitation. No other open lesions or pre-ulcerative lesions.  No pain with calf compression, swelling, warmth, erythema.   Assessment and Plan:  Status post right partial fourth, fifth ray amputation; new drainage noted  -Treatment options discussed including all alternatives, risks, and complications -All the sutures intact because I feel the incisions and the open want to take them out.  I did take a culture of the drainage today.  We will continue with Betadine dressing changes for now.  Continue to limit activity, elevation. -Follow-up with vascular surgery on Monday. -Monitor for any clinical signs or symptoms of infection and directed to call the office immediately should any occur or go to the ER.  Return in about 1 week (around 03/07/2021).  Vivi Barrack DPM

## 2021-03-04 NOTE — Progress Notes (Signed)
Office Note     CC:  follow up Requesting Provider:  Joice Lofts, NP  HPI: [name] is a 55 y.o. (January 08, 1966) male who presents for evaluation of PAD.  He underwent angiogram with drug-coated balloon angioplasty of the right popliteal artery as well as balloon angioplasty of the right posterior tibial artery due to a toe ulcer.  This was performed by Dr. Myra Gianotti on 01/29/2021.  The following day he underwent fourth and fifth toe ray amputation by podiatrist Dr. Ardelle Anton.  He states the amputation site is not healing well.  He denies rest pain.  He denies fevers, chills, nausea/vomiting.  He states his left groin healed up well after catheterization.   Past Medical History:  Diagnosis Date   Anginal pain (HCC)    Ataxia    CAD (coronary artery disease)    Diabetes mellitus without complication (HCC)    Dizziness    Hyperlipidemia    Hypertension    Near syncope     Past Surgical History:  Procedure Laterality Date   ABDOMINAL AORTOGRAM W/LOWER EXTREMITY Right 01/29/2021   Procedure: ABDOMINAL AORTOGRAM W/LOWER EXTREMITY;  Surgeon: Nada Libman, MD;  Location: [facility];  Service: Cardiovascular;  Laterality: Right;   AMPUTATION Right 01/30/2021   Procedure: AMPUTATION RAY;  Surgeon: Vivi Barrack, DPM;  Location: [facility] OR;  Service: Podiatry;  Laterality: Right;   CARDIAC CATHETERIZATION  12/24/2020   CORONARY ARTERY BYPASS GRAFT N/A 12/27/2020   Procedure: CORONARY ARTERY BYPASS GRAFTING (CABG) X FOUR ON PUMP USING LEFT INTERNAL MAMMARY ARTERY AND RIGHT ENDOSCOPIC GREATER SAPHENOUS VEIN CONDUITS;  Surgeon: Corliss Skains, MD;  Location: [facility] OR;  Service: Open Heart Surgery;  Laterality: N/A;   LEFT HEART CATH AND CORONARY ANGIOGRAPHY N/A 12/24/2020   Procedure: LEFT HEART CATH AND CORONARY ANGIOGRAPHY;  Surgeon: Kathleene Hazel, MD;  Location: [facility];  Service: Cardiovascular;  Laterality: N/A;   PERIPHERAL VASCULAR BALLOON ANGIOPLASTY  Right 01/29/2021   Procedure: PERIPHERAL VASCULAR BALLOON ANGIOPLASTY;  Surgeon: Nada Libman, MD;  Location: [facility];  Service: Cardiovascular;  Laterality: Right;  SFA and PT   TEE WITHOUT CARDIOVERSION N/A 12/27/2020   Procedure: TRANSESOPHAGEAL ECHOCARDIOGRAM (TEE);  Surgeon: Corliss Skains, MD;  Location: [facility] OR;  Service: Open Heart Surgery;  Laterality: N/A;    Social History   Socioeconomic History   Marital status: Married    Spouse name: Not on file   Number of children: Not on file   Years of education: Not on file   Highest education level: Not on file  Occupational History   Not on file  Tobacco Use   Smoking status: Former    Packs/day: 1.00    Years: 40.00    Pack years: 40.00    Types: Cigarettes    Quit date: 12/24/2020    Years since quitting: 0.1   Smokeless tobacco: Never  Vaping Use   Vaping Use: Never used  Substance and Sexual Activity   Alcohol use: Not Currently   Drug use: Never   Sexual activity: Not on file  Other Topics Concern   Not on file  Social History Narrative   Lives with wife   2-3 cps of caffeine    Social Determinants of Health   Financial Resource Strain: Not on file  Food Insecurity: Not on file  Transportation Needs: Not on file  Physical Activity: Not on file  Stress: Not on file  Social Connections: Not on  file  Intimate Partner Violence: Not on file   History reviewed. No pertinent family history.  Current Outpatient Medications  Medication Sig Dispense Refill   Accu-Chek Softclix Lancets lancets SMARTSIG:Topical     acetaminophen (TYLENOL) 500 MG tablet Take 1,000 mg by mouth every 6 (six) hours as needed for moderate pain or headache.     aspirin 81 MG chewable tablet Chew 1 tablet (81 mg total) by mouth daily. 30 tablet 1   busPIRone (BUSPAR) 10 MG tablet Take 10 mg by mouth 2 (two) times daily.     clopidogrel (PLAVIX) 75 MG tablet Take 1 tablet (75 mg total) by mouth daily with breakfast. 30  tablet 1   CYMBALTA 60 MG capsule Take 60 mg by mouth in the morning.     diphenhydrAMINE (BENADRYL) 25 MG tablet Take 25 mg by mouth in the morning.     doxycycline (VIBRA-TABS) 100 MG tablet Take 1 tablet (100 mg total) by mouth 2 (two) times daily. 20 tablet 0   ferrous sulfate 325 (65 FE) MG tablet Take 325 mg by mouth daily with breakfast.     gabapentin (NEURONTIN) 300 MG capsule Take 300 mg by mouth 3 (three) times daily.     glipiZIDE (GLUCOTROL) 10 MG tablet Take 10 mg by mouth 2 (two) times daily.     HYDROcodone-acetaminophen (NORCO/VICODIN) 5-325 MG tablet Take 1 tablet by mouth every 6 (six) hours as needed for moderate pain. 10 tablet 0   Insulin Glargine (BASAGLAR KWIKPEN) 100 UNIT/ML Inject 10 Units into the skin in the morning.     metFORMIN (GLUCOPHAGE) 1000 MG tablet Take 500 mg by mouth 2 (two) times daily.     metoprolol tartrate (LOPRESSOR) 25 MG tablet Take 0.5 tablets (12.5 mg total) by mouth 2 (two) times daily. 30 tablet 5   midodrine (PROAMATINE) 10 MG tablet Take 1 tablet (10 mg total) by mouth 3 (three) times daily with meals. 90 tablet 5   omeprazole (PRILOSEC) 40 MG capsule Take 40 mg by mouth in the morning.     rosuvastatin (CRESTOR) 20 MG tablet Take 1 tablet (20 mg total) by mouth daily. 90 tablet 1   topiramate (TOPAMAX) 50 MG tablet Take 1 tablet (50 mg total) by mouth 2 (two) times daily. 60 tablet 12   Vitamin D, Ergocalciferol, (DRISDOL) 1.25 MG (50000 UNIT) CAPS capsule Take 50,000 Units by mouth every Friday.     No current facility-administered medications for this visit.    Allergies  Allergen Reactions   Atorvastatin Other (See Comments)    Myalgias      REVIEW OF SYSTEMS:   [X]  denotes positive finding, [ ]  denotes negative finding Cardiac  Comments:  Chest pain or chest pressure:    Shortness of breath upon exertion:    Short of breath when lying flat:    Irregular heart rhythm:        Vascular    Pain in calf, thigh, or hip brought  on by ambulation:    Pain in feet at night that wakes you up from your sleep:     Blood clot in your veins:    Leg swelling:         Pulmonary    Oxygen at home:    Productive cough:     Wheezing:         Neurologic    Sudden weakness in arms or legs:     Sudden numbness in arms or legs:  Sudden onset of difficulty speaking or slurred speech:    Temporary loss of vision in one eye:     Problems with dizziness:         Gastrointestinal    Blood in stool:     Vomited blood:         Genitourinary    Burning when urinating:     Blood in urine:        Psychiatric    Major depression:         Hematologic    Bleeding problems:    Problems with blood clotting too easily:        Skin    Rashes or ulcers:        Constitutional    Fever or chills:      PHYSICAL EXAMINATION:  Vitals:   03/04/21 1503  BP: 98/63  Pulse: 86  Resp: 14  Temp: 98.1 F (36.7 C)  TempSrc: Temporal  Weight: 124 lb (56.2 kg)  Height: 5\' 7"  (1.702 m)    General:  WDWN in NAD; vital signs documented above Gait: Not observed HENT: WNL, normocephalic Pulmonary: normal non-labored breathing , without Rales, rhonchi,  wheezing Cardiac: regular HR Abdomen: soft, NT, no masses Skin: without rashes Vascular Exam/Pulses:  Right Left  Radial 2+ (normal) 2+ (normal)  DP 2+ (normal) Not evaluated  PT Brisk by Doppler Brisk by Doppler   Extremities: Fourth and fifth toe ray amputation site with dehiscence and necrotic tissue at wound bed Musculoskeletal: no muscle wasting or atrophy  Neurologic: A&O X 3;  No focal weakness or paresthesias are detected Psychiatric:  The pt has Normal affect.   Non-Invasive Vascular Imaging:   Popliteal and PTA patent after intervention  ABI/TBIToday's ABIToday's TBIPrevious ABIPrevious TBI  +-------+-----------+-----------+------------+------------+  Right  1.23       0.63       0.61        0.08           +-------+-----------+-----------+------------+------------+  Left   0.97       0.53       1.04        0.85        ASSESSMENT/PLAN:: 55 y.o. male status post right popliteal and PTA balloon angioplasty  -Right foot well-perfused with 2+ palpable DP pulse; arterial duplex demonstrates a widely patent popliteal and PTA after intervention -Despite the above findings, the right amputation of fourth and fifth toes does not seem to be healing -Nothing further to offer from a vascular surgery standpoint.  Based on the imaging studies his circulation is optimized.  He will follow-up with his podiatrist on Thursday for probable further debridement and possible third toe amputation -We will recheck imaging studies in 3 months per protocol.  Patient will call/return office sooner with any questions or concerns   Wednesday, PA-C [facility] [phone]  Clinic MD:   [phone]

## 2021-03-04 NOTE — Telephone Encounter (Signed)
Called and spoke with the patient's wife today and relayed the message per Dr Ardelle Anton and patient's wife stated that the vascular studies came back normal and does have an appointment on 03-07-2021. Misty Stanley

## 2021-03-04 NOTE — Telephone Encounter (Signed)
-----   Message from Vivi Barrack, DPM sent at 03/04/2021  8:06 AM EDT ----- Misty Stanley- please let him know that the culture is growing MRSA still. I have refilled doxycyline. Below is the message I sent to him on MyChart:  The culture is still growing MRSA. I have refilled the doxycyline. If there is any increased drainage, swelling, redness or any other signs of infection such as fevers/chills please let me know immediately or go to the ER.

## 2021-03-07 ENCOUNTER — Ambulatory Visit (INDEPENDENT_AMBULATORY_CARE_PROVIDER_SITE_OTHER): Payer: Medicaid Other | Admitting: Podiatry

## 2021-03-07 ENCOUNTER — Ambulatory Visit (INDEPENDENT_AMBULATORY_CARE_PROVIDER_SITE_OTHER): Payer: Medicaid Other

## 2021-03-07 ENCOUNTER — Other Ambulatory Visit: Payer: Self-pay

## 2021-03-07 DIAGNOSIS — L97512 Non-pressure chronic ulcer of other part of right foot with fat layer exposed: Secondary | ICD-10-CM | POA: Diagnosis not present

## 2021-03-07 DIAGNOSIS — I739 Peripheral vascular disease, unspecified: Secondary | ICD-10-CM

## 2021-03-11 ENCOUNTER — Telehealth: Payer: Self-pay | Admitting: *Deleted

## 2021-03-11 ENCOUNTER — Other Ambulatory Visit: Payer: Self-pay | Admitting: Podiatry

## 2021-03-11 MED ORDER — HYDROCODONE-ACETAMINOPHEN 5-325 MG PO TABS: 1.0000 | ORAL_TABLET | Freq: Four times a day (QID) | ORAL | 0 refills | Status: DC | PRN

## 2021-03-11 NOTE — Telephone Encounter (Signed)
Patient's wife is calling to request a refill of pain medicine for husband,since having stitches removed is complaining of pain all weekend. Please advise.

## 2021-03-11 NOTE — Telephone Encounter (Signed)
Returned the call to patient's wife and informed that pain medicine has been approved and sent to pharmacy on file.

## 2021-03-13 NOTE — Progress Notes (Signed)
Subjective: [name] is a 55 y.o. is seen today in office s/p right partial fourth, fifth ray amputations preformed on 01/30/2021.  His wife states that he may be doing somewhat better.  Still some occasional discomfort but is not worsening.  Some drainage still present.  He is on the doxycycline.  No fevers or chills.  No other concerns.  Objective: General: No acute distress, AAOx3  DP/PT pulses palpable Right foot: Today the incision is still coapted on the proximal distal portions.  On central portion there is still probing noted but there is no probing to bone, undermining or tunneling.  Faint rim of erythema noted without any ascending cellulitis.  Mild drainage today but appears to be improved.  There is no fluctuation crepitation.  There is no malodor. No other open lesions or pre-ulcerative lesions.  No pain with calf compression, swelling, warmth, erythema.   Assessment and Plan:  Status post right partial fourth, fifth ray amputation; new drainage noted  -Treatment options discussed including all alternatives, risks, and complications -Repeat x-ray today reviewed.  No definitive osteomyelitis identified. -Remove most of the sutures today without the central portion intact with there is dehiscence of the wound.  Already continue with daily dressing changes for now but also placed referral to the wound care center.  Discussed return to the operating room.  He does not want proceed with a transmetatarsal amputation for now.  He may need a third toe amputated at that point I recommend the TMA. -Continue doxycycline  Vivi Barrack DPM\

## 2021-03-14 ENCOUNTER — Ambulatory Visit (INDEPENDENT_AMBULATORY_CARE_PROVIDER_SITE_OTHER): Payer: Medicaid Other | Admitting: Podiatry

## 2021-03-14 ENCOUNTER — Other Ambulatory Visit: Payer: Self-pay

## 2021-03-14 DIAGNOSIS — L97512 Non-pressure chronic ulcer of other part of right foot with fat layer exposed: Secondary | ICD-10-CM

## 2021-03-14 DIAGNOSIS — I739 Peripheral vascular disease, unspecified: Secondary | ICD-10-CM

## 2021-03-14 MED ORDER — SANTYL 250 UNIT/GM EX OINT: 1.0000 "application " | TOPICAL_OINTMENT | Freq: Every day | CUTANEOUS | 0 refills | Status: AC

## 2021-03-15 NOTE — Progress Notes (Signed)
Subjective: [name] is a 55 y.o. is seen today in office s/p right partial fourth, fifth ray amputations preformed on 01/30/2021.  We will continue dressing changes.  They discussion with the wound care center as well.  No significant change.  No fevers or chills.  Objective: General: No acute distress, AAOx3  DP/PT pulses palpable Right foot: Today the incision is still coapted on the proximal distal portions.  On central portion there is still probing noted but there is no probing to bone, undermining or tunneling.  Faint rim of erythema noted without any ascending cellulitis.  Fibrotic tissue present within the wound.  There is no significant drainage identified today and there is no ascending cellulitis.  No other open lesions or pre-ulcerative lesions.  No pain with calf compression, swelling, warmth, erythema.   Assessment and Plan:  Status post right partial fourth, fifth ray amputation; new drainage noted  -Treatment options discussed including all alternatives, risks, and complications -At this point he does not want proceed with any further surgical intervention.  Discussed possible return to the operating room for debridement of the wound.  They are concerned about going through surgery and not healing.  Other option is further amputation.  They are not open to that at this time.  I have ordered Santyl to apply to the wounds and I discussed application instructions.  Continue surgical shoe, elevation. -Monitor for any clinical signs or symptoms of infection and directed to call the office immediately should any occur or go to the ER.  Return in about 1 week (around 03/21/2021).  Vivi Barrack DPM

## 2021-03-19 ENCOUNTER — Other Ambulatory Visit: Payer: Self-pay

## 2021-03-19 ENCOUNTER — Encounter (HOSPITAL_BASED_OUTPATIENT_CLINIC_OR_DEPARTMENT_OTHER): Payer: Self-pay | Admitting: Family

## 2021-03-19 ENCOUNTER — Ambulatory Visit (HOSPITAL_BASED_OUTPATIENT_CLINIC_OR_DEPARTMENT_OTHER): Payer: Medicaid Other | Admitting: Family

## 2021-03-19 VITALS — BP 122/66 | HR 81 | Ht 67.0 in | Wt 128.0 lb

## 2021-03-19 DIAGNOSIS — I951 Orthostatic hypotension: Secondary | ICD-10-CM | POA: Diagnosis not present

## 2021-03-19 DIAGNOSIS — E118 Type 2 diabetes mellitus with unspecified complications: Secondary | ICD-10-CM | POA: Diagnosis not present

## 2021-03-19 DIAGNOSIS — E785 Hyperlipidemia, unspecified: Secondary | ICD-10-CM

## 2021-03-19 DIAGNOSIS — I25118 Atherosclerotic heart disease of native coronary artery with other forms of angina pectoris: Secondary | ICD-10-CM | POA: Diagnosis not present

## 2021-03-19 DIAGNOSIS — Z794 Long term (current) use of insulin: Secondary | ICD-10-CM

## 2021-03-19 MED ORDER — CLOPIDOGREL BISULFATE 75 MG PO TABS: 75.0000 mg | ORAL_TABLET | Freq: Every day | ORAL | 11 refills | Status: AC

## 2021-03-19 NOTE — Patient Instructions (Addendum)
Medication Instructions:  Continue your current medications.   *If you need a refill on your cardiac medications before your next appointment, please call your pharmacy*   Lab Work: None ordered today.   Testing/Procedures: None ordered today.   Follow-Up: At [facility], you and your health needs are our priority.  As part of our continuing mission to provide you with exceptional heart care, we have created designated Provider Care Teams.  These Care Teams include your primary Cardiologist (physician) and Advanced Practice Providers (APPs -  Physician Assistants and Nurse Practitioners) who all work together to provide you with the care you need, when you need it.  We recommend signing up for the patient portal called "MyChart".  Sign up information is provided on this After Visit Summary.  MyChart is used to connect with patients for Virtual Visits (Telemedicine).  Patients are able to view lab/test results, encounter notes, upcoming appointments, etc.  Non-urgent messages can be sent to your provider as well.   To learn more about what you can do with MyChart, go to [email or web address].    Your next appointment:   In March as scheduled with Meriam Sprague, MD  Other Instructions  Pulmonology: [address] [place], [place] [address] [place]: [phone] ____________________________________  Heart Healthy Diet Recommendations: A low-salt diet is recommended. Meats should be grilled, baked, or boiled. Avoid fried foods. Focus on lean protein sources like fish or chicken with vegetables and fruits. The American Heart Association is a Chief Technology Officer!   ____________________________________  Exercise recommendations: The American Heart Association recommends 150 minutes of moderate intensity exercise weekly. Try 30 minutes of moderate intensity exercise 4-5 times per week. This could include walking, jogging, or swimming.

## 2021-03-19 NOTE — Progress Notes (Signed)
Office Visit    Patient Name: [name] Date of Encounter: 03/19/2021  PCP:  Joice Lofts, NP   [facility]  Cardiologist:  Meriam Sprague, MD  Advanced Practice Provider:  No care team member to display Electrophysiologist:  None   Chief Complaint    [name] is a 55 y.o. male with a hx of CAD s/p CABG, DM2, HTN, HLD presents today for follow up of CAD  Past Medical History    Past Medical History:  Diagnosis Date   Anginal pain (HCC)    Ataxia    CAD (coronary artery disease)    Diabetes mellitus without complication (HCC)    Dizziness    Hyperlipidemia    Hypertension    Near syncope    Past Surgical History:  Procedure Laterality Date   ABDOMINAL AORTOGRAM W/LOWER EXTREMITY Right 01/29/2021   Procedure: ABDOMINAL AORTOGRAM W/LOWER EXTREMITY;  Surgeon: Nada Libman, MD;  Location: [facility] INVASIVE CV LAB;  Service: Cardiovascular;  Laterality: Right;   AMPUTATION Right 01/30/2021   Procedure: AMPUTATION RAY;  Surgeon: Vivi Barrack, DPM;  Location: [facility] OR;  Service: Podiatry;  Laterality: Right;   CARDIAC CATHETERIZATION  12/24/2020   CORONARY ARTERY BYPASS GRAFT N/A 12/27/2020   Procedure: CORONARY ARTERY BYPASS GRAFTING (CABG) X FOUR ON PUMP USING LEFT INTERNAL MAMMARY ARTERY AND RIGHT ENDOSCOPIC GREATER SAPHENOUS VEIN CONDUITS;  Surgeon: Corliss Skains, MD;  Location: [facility] OR;  Service: Open Heart Surgery;  Laterality: N/A;   LEFT HEART CATH AND CORONARY ANGIOGRAPHY N/A 12/24/2020   Procedure: LEFT HEART CATH AND CORONARY ANGIOGRAPHY;  Surgeon: Kathleene Hazel, MD;  Location: [facility] INV[facility] CV [facility];  Service: Cardiovascular;  Laterality: N/A;   PERIPHERAL VASCULAR BALLOON ANGIOPLASTY Right 01/29/2021   Procedure: PERIPHERAL VASCULAR BALLOON ANGIOPLASTY;  Surgeon: Nada Libman, MD;  Location: [facility]VAS[facility] CV [facility];  Service: Cardiovascular;  Laterality: Right;  SFA and PT   TEE WITHOUT CARDIOVERSION N/A 12/27/2020    Procedure: TRANSESOPHAGEAL ECHOCARDIOGRAM (TEE);  Surgeon: Corliss Skains, MD;  Location: [facility] OR;  Service: Open Heart Surgery;  Laterality: N/A;    Allergies  Allergies  Allergen Reactions   Atorvastatin Other (See Comments)    Myalgias     History of Present Illness    [name] is a 55 y.o. male with a hx of CAD s/p CABG, DM2, HTN, HLD last seen 01/17/21  Seen in clinic by Dr. Shari Prows 12/10/20 and recommended for cardiac catheterization. Cardiac catheterization 12/24/20 with severe three vessel disease. Echo with LVEF 55-60%, normal RV function, no significant valvular abnormalities. He was evaluated by TCTS and underwent CABGx4 (LIMA-SVG, reverse SVG-PDA, OM3/posterior lateral branch, diagonal). He had expected postoperative anemia and did not require transfusion. He was drowsy and lethargic though with no focal neurological defects on transition to progressive care. He progressed slowly due to orthostatic hypotension in setting of poor PO intake. His Carvedilol and Lisinopril were discontinued. He was discharged on Midodrine 10mg  TID.   His wife contacted the office noting the patient was having episodes of confusion and hallucinations. Per her report these were also present in the hospital. BMP and CBC were collected. Hb was 9.4 which was stable compared to discharge, WBC normal, platelets 5569, creatinine 0.92, GFR 98, K 4.7, Na 136.   Seen 01/17/21 as post CABG follow still overall fatigued and recovering from anesthesia but participating in home health. He was admitted 01/22/21-02/02/21 after presenting with RLE infection and osteomyelitis. He underwent  revascularization (aortogram with popliteal tibial intervention 01/29/21) with vascular surgery and right partial fourth, fifth ray amputation 01/30/21. He did fall 01/31/21 with CT head, maxillofacial with no fracture no acute findings.    He presents today for follow up with his wife. He is walking without [name] or cane. Started  Santyl a few days ago at direction of podiatry and is hopeful amputation site will heal. E is very hesitant regarding debridement or further amputation. Reports no shortness of breath nor dyspnea on exertion. Reports no chest pain, pressure, or tightness. No edema, orthopnea, PND. Reports no palpitations.  Reports his energy level is much improved since I saw him last.    EKGs/Labs/Other Studies Reviewed:   The following studies were reviewed today:  Intraoperative echo 12/27/20 POST-OP IMPRESSIONS  Overall, there were no significant changes from pre-bypass.  - Comments: LV Hyperdynamic after separation from bypass.   PRE-OP FINDINGS   Left Ventricle: The left ventricle has normal systolic function, with an  ejection fraction of 55-60%. The cavity size was normal. There is no left  ventricular hypertrophy.    Right Ventricle: The right ventricle has normal systolic function. The  cavity was normal. There is no increase in right ventricular wall  thickness.   Left Atrium: Left atrial size was normal in size. No left atrial/left  atrial appendage thrombus was detected.   Right Atrium: Right atrial size was normal in size.   Interatrial Septum: No atrial level shunt detected by color flow Doppler.   Pericardium: There is no evidence of pericardial effusion.   Mitral Valve: The mitral valve is normal in structure. Mitral valve  regurgitation is trivial by color flow Doppler. There is No evidence of  mitral stenosis.   Tricuspid Valve: The tricuspid valve was normal in structure. Tricuspid  valve regurgitation is trivial by color flow Doppler. No evidence of  tricuspid stenosis is present.   Aortic Valve: The aortic valve is tricuspid Aortic valve regurgitation was  not visualized by color flow Doppler. There is no stenosis of the aortic  valve. There is no evidence of aortic valve vegetation.    Pulmonic Valve: The pulmonic valve was normal in structure.  Pulmonic valve  regurgitation is trivial by color flow Doppler.    Aorta: The ascending aorta is normal in size and structure. The aortic  arch was not well visualized. There is evidence of plaque in the  descending aorta; Grade III, measuring 3-21mm in size.   Doppler Pre CABG Right ABI: Resting right ankle-brachial index indicates moderate right  lower extremity arterial disease. The right toe-brachial index is  abnormal.  Left ABI: Resting left ankle-brachial index indicates moderate left lower  extremity arterial disease. The left toe-brachial index is abnormal.  Right Upper Extremity: Doppler waveforms remain within normal limits with  right radial compression. Doppler waveforms remain within normal limits  with right ulnar compression.  Left Upper Extremity: Doppler waveforms remain within normal limits with  left radial compression. Doppler waveform obliterate with left ulnar  compression.    LHC 12/24/20  Prox RCA lesion is 100% stenosed. Ost Cx to Prox Cx lesion is 70% stenosed. Dist Cx lesion is 99% stenosed. 1st Mrg lesion is 70% stenosed. Prox LAD to Mid LAD lesion is 70% stenosed. 1st Diag lesion is 70% stenosed. Mid LAD lesion is 50% stenosed.   1. Severe three vessel CAD 2. Severe proximal to mid LAD stenosis. Severe stenosis in the moderate caliber first diagonal branch. 3. Severe stenosis proximal Circumflex  and distal AV groove Circumflex. Severe stenosis first obtuse marginal branch. 4. Chronic occlusion of the dominant RCA. The PDA fills from left to right collaterals.   Recommendations: He has been having daily angina. Will admit to telemetry. Echo to assess LV function. CT surgery consult for CABG.  EKG:  EKG is ordered today.  The ekg ordered today demonstrates SR 95 bpm with stable inferior TWI, stable lateral TWI, anterior ST elevation improving compared to previous. Stable compared to previous.   Recent Labs: 12/28/2020: Magnesium 2.3 01/25/2021: ALT 25 02/01/2021: BUN  21; Creatinine, Ser 0.77; Hemoglobin 8.9; Platelets 343; Potassium 3.7; Sodium 136  Recent Lipid Panel    Component Value Date/Time   CHOL 130 12/26/2020 0455   TRIG 107 12/26/2020 0455   HDL 41 12/26/2020 0455   CHOLHDL 3.2 12/26/2020 0455   VLDL 21 12/26/2020 0455   LDLCALC 68 12/26/2020 0455     Home Medications   Current Meds  Medication Sig   Accu-Chek Softclix Lancets lancets SMARTSIG:Topical   acetaminophen (TYLENOL) 500 MG tablet Take 1,000 mg by mouth every 6 (six) hours as needed for moderate pain or headache.   aspirin 81 MG chewable tablet Chew 1 tablet (81 mg total) by mouth daily.   busPIRone (BUSPAR) 10 MG tablet Take 10 mg by mouth 2 (two) times daily.   clopidogrel (PLAVIX) 75 MG tablet Take 1 tablet (75 mg total) by mouth daily with breakfast.   collagenase (SANTYL) ointment Apply 1 application topically daily.   CYMBALTA 60 MG capsule Take 60 mg by mouth in the morning.   diphenhydrAMINE (BENADRYL) 25 MG tablet Take 25 mg by mouth in the morning.   doxycycline (VIBRA-TABS) 100 MG tablet Take 1 tablet (100 mg total) by mouth 2 (two) times daily.   ferrous sulfate 325 (65 FE) MG tablet Take 325 mg by mouth daily with breakfast.   gabapentin (NEURONTIN) 300 MG capsule Take 300 mg by mouth 3 (three) times daily.   glipiZIDE (GLUCOTROL) 10 MG tablet Take 10 mg by mouth 2 (two) times daily.   HYDROcodone-acetaminophen (NORCO/VICODIN) 5-325 MG tablet Take 1 tablet by mouth every 6 (six) hours as needed for moderate pain.   Insulin Glargine (BASAGLAR KWIKPEN) 100 UNIT/ML Inject 10 Units into the skin in the morning.   metFORMIN (GLUCOPHAGE) 1000 MG tablet Take 500 mg by mouth 2 (two) times daily.   metoprolol tartrate (LOPRESSOR) 25 MG tablet Take 0.5 tablets (12.5 mg total) by mouth 2 (two) times daily.   midodrine (PROAMATINE) 10 MG tablet Take 1 tablet (10 mg total) by mouth 3 (three) times daily with meals.   omeprazole (PRILOSEC) 40 MG capsule Take 40 mg by mouth in  the morning.   rosuvastatin (CRESTOR) 20 MG tablet Take 1 tablet (20 mg total) by mouth daily.   topiramate (TOPAMAX) 50 MG tablet Take 1 tablet (50 mg total) by mouth 2 (two) times daily.   Vitamin D, Ergocalciferol, (DRISDOL) 1.25 MG (50000 UNIT) CAPS capsule Take 50,000 Units by mouth every Friday.     Review of Systems      All other systems reviewed and are otherwise negative except as noted above.  Physical Exam    VS:  BP 122/66   Pulse 81   Ht 5\' 7"  (1.702 m)   Wt 128 lb (58.1 kg)   SpO2 100%   BMI 20.05 kg/m  , BMI Body mass index is 20.05 kg/m.  Wt Readings from Last 3 Encounters:  03/19/21 128 lb (  58.1 kg)  03/04/21 124 lb (56.2 kg)  02/15/21 124 lb (56.2 kg)     GEN: Well nourished, well developed, in no acute distress. HEENT: normal. Neck: Supple, no JVD, carotid bruits, or masses. Cardiac: RRR, no murmurs, rubs, or gallops. No clubbing, cyanosis, edema.  Radials/PT 2+ and equal bilaterally.  Respiratory:  Respirations regular and unlabored, clear to auscultation bilaterally. GI: Soft, nontender, nondistended. MS: No deformity or atrophy. Skin: Warm and dry, no rash. Neuro:  Strength and sensation are intact. Psych: Normal affect.  Assessment & Plan    CAD s/p CABG - Stable with no anginal symptoms. No indication for ischemic evaluation.  GDMT includes Rosuvastatin, Aspirin, Metoprolol. Heart healthy diet and regular cardiovascular exercise encouraged.    Hypotension - Continue Midodrine 10mg  TID. Home monitoring encouraged.  DM2 - Continue to follow with PCP.   HLD, LDL goal <70 - 12/26/20 LDL 68. Continue Rosuvastatin 20mg  QD.  Tobacco use - Congratulated on continued cessation.  PAD - Continue to follow with vascular surgery. Recent ABi with no claudication.   Disposition: Follow up 08/2021 with Dr. as scheduled  Signed, 09/2021, NP 03/19/2021, 1:43 PM [facility]

## 2021-03-20 ENCOUNTER — Telehealth: Payer: Self-pay | Admitting: *Deleted

## 2021-03-20 ENCOUNTER — Other Ambulatory Visit: Payer: Self-pay | Admitting: *Deleted

## 2021-03-20 DIAGNOSIS — Z87891 Personal history of nicotine dependence: Secondary | ICD-10-CM

## 2021-03-20 NOTE — Telephone Encounter (Signed)
Patient's wife is calling to request a refill of pain medicine(Hydrocodone-ace,5-325 mg) and more gauze for his foot. Please advise.

## 2021-03-21 ENCOUNTER — Ambulatory Visit (INDEPENDENT_AMBULATORY_CARE_PROVIDER_SITE_OTHER): Payer: Medicaid Other | Admitting: Podiatry

## 2021-03-21 ENCOUNTER — Other Ambulatory Visit: Payer: Self-pay

## 2021-03-21 ENCOUNTER — Encounter (HOSPITAL_BASED_OUTPATIENT_CLINIC_OR_DEPARTMENT_OTHER): Payer: Medicaid Other | Attending: Internal Medicine | Admitting: Internal Medicine

## 2021-03-21 DIAGNOSIS — M869 Osteomyelitis, unspecified: Secondary | ICD-10-CM | POA: Insufficient documentation

## 2021-03-21 DIAGNOSIS — E11621 Type 2 diabetes mellitus with foot ulcer: Secondary | ICD-10-CM | POA: Insufficient documentation

## 2021-03-21 DIAGNOSIS — Z794 Long term (current) use of insulin: Secondary | ICD-10-CM | POA: Diagnosis not present

## 2021-03-21 DIAGNOSIS — T8131XA Disruption of external operation (surgical) wound, not elsewhere classified, initial encounter: Secondary | ICD-10-CM | POA: Diagnosis not present

## 2021-03-21 DIAGNOSIS — Y835 Amputation of limb(s) as the cause of abnormal reaction of the patient, or of later complication, without mention of misadventure at the time of the procedure: Secondary | ICD-10-CM | POA: Diagnosis not present

## 2021-03-21 DIAGNOSIS — I739 Peripheral vascular disease, unspecified: Secondary | ICD-10-CM

## 2021-03-21 DIAGNOSIS — L97512 Non-pressure chronic ulcer of other part of right foot with fat layer exposed: Secondary | ICD-10-CM

## 2021-03-21 DIAGNOSIS — L97516 Non-pressure chronic ulcer of other part of right foot with bone involvement without evidence of necrosis: Secondary | ICD-10-CM | POA: Diagnosis not present

## 2021-03-21 DIAGNOSIS — E1169 Type 2 diabetes mellitus with other specified complication: Secondary | ICD-10-CM | POA: Insufficient documentation

## 2021-03-21 DIAGNOSIS — T8189XA Other complications of procedures, not elsewhere classified, initial encounter: Secondary | ICD-10-CM | POA: Diagnosis not present

## 2021-03-21 DIAGNOSIS — E1142 Type 2 diabetes mellitus with diabetic polyneuropathy: Secondary | ICD-10-CM | POA: Insufficient documentation

## 2021-03-21 MED ORDER — DOXYCYCLINE HYCLATE 100 MG PO TABS: 100.0000 mg | ORAL_TABLET | Freq: Two times a day (BID) | ORAL | 0 refills | Status: DC

## 2021-03-21 MED ORDER — HYDROCODONE-ACETAMINOPHEN 5-325 MG PO TABS: 1.0000 | ORAL_TABLET | Freq: Four times a day (QID) | ORAL | 0 refills | Status: AC | PRN

## 2021-03-21 NOTE — Progress Notes (Addendum)
SAIFULLAH, MARTELLO ([id number]) Visit Report for 03/21/2021 Chief Complaint Document Details Patient Name: Date of Service: [name] 03/21/2021 7:30 A M Medical Record Number: [id number] Patient Account Number: [id number] Date of Birth/Sex: Treating RN: 12-28-1965 (55 y.o. Charlean Merl, Lauren Primary Care Provider: Gayleen Orem, [facility] Other Clinician: Referring Provider: Treating Provider/Extender: Geralyn Corwin BRO WN, EMILY Weeks in Treatment: 0 Information Obtained from: Patient Chief Complaint Right foot wound dehiscence status post fourth and fifth ray amputation secondary to osteomyelitis Electronic Signature(s) Signed: 03/21/2021 1:23:11 PM By: Geralyn Corwin DO Entered By: Geralyn Corwin on 03/21/2021 09:05:03 -------------------------------------------------------------------------------- Debridement Details Patient Name: Date of Service: [name], [name] 03/21/2021 7:30 A M Medical Record Number: [id number] Patient Account Number: [id number] Date of Birth/Sex: Treating RN: Apr 21, 1966 (55 y.o. Lytle Michaels Primary Care Provider: Gayleen Orem, EMILY Other Clinician: Referring Provider: Treating Provider/Extender: Geralyn Corwin BRO WN, EMILY Weeks in Treatment: 0 Debridement Performed for Assessment: Wound #2 Right,Lateral Foot Performed By: Physician Geralyn Corwin, DO Debridement Type: Debridement Level of Consciousness (Pre-procedure): Awake and Alert Pre-procedure Verification/Time Out Yes - 08:34 Taken: Start Time: 08:40 T Area Debrided (L x W): otal 1.2 (cm) x 1.2 (cm) = 1.44 (cm) Tissue and other material debrided: Non-Viable, Eschar, Slough, Slough Level: Non-Viable Tissue Debridement Description: Selective/Open Wound Instrument: Blade, Forceps Bleeding: Minimum Hemostasis Achieved: Pressure End Time: 08:42 Response to Treatment: Procedure was tolerated well Level of Consciousness (Post- Awake and Alert procedure): Post Debridement  Measurements of Total Wound Length: (cm) 1.2 Width: (cm) 1.2 Depth: (cm) 0.5 Volume: (cm) 0.565 Character of Wound/Ulcer Post Debridement: Stable Post Procedure Diagnosis Same as Pre-procedure Electronic Signature(s) Signed: 03/21/2021 1:23:11 PM By: Geralyn Corwin DO Signed: 03/21/2021 5:58:36 PM By: Antonieta Iba Entered By: Antonieta Iba on 03/21/2021 08:42:31 -------------------------------------------------------------------------------- Debridement Details Patient Name: Date of Service: [name], [name] 03/21/2021 7:30 A M Medical Record Number: [id number] Patient Account Number: [id number] Date of Birth/Sex: Treating RN: 1966/03/06 (55 y.o. Elizebeth Koller Primary Care Provider: Gayleen Orem, EMILY Other Clinician: Referring Provider: Treating Provider/Extender: Geralyn Corwin BRO WN, EMILY Weeks in Treatment: 0 Debridement Performed for Assessment: Wound #1 Right Amputation Site - Toe Performed By: Physician Geralyn Corwin, DO Debridement Type: Debridement Level of Consciousness (Pre-procedure): Awake and Alert Pre-procedure Verification/Time Out Yes - 08:34 Taken: Start Time: 08:35 T Area Debrided (L x W): otal 4.8 (cm) x 1.5 (cm) = 7.2 (cm) Tissue and other material debrided: Non-Viable, Eschar, Slough, Slough Level: Non-Viable Tissue Debridement Description: Selective/Open Wound Instrument: Blade, Forceps Bleeding: Minimum Hemostasis Achieved: Pressure End Time: 08:40 Response to Treatment: Procedure was tolerated well Level of Consciousness (Post- Awake and Alert procedure): Post Debridement Measurements of Total Wound Length: (cm) 4.8 Width: (cm) 1.5 Depth: (cm) 0.9 Volume: (cm) 5.089 Character of Wound/Ulcer Post Debridement: Stable Post Procedure Diagnosis Same as Pre-procedure Electronic Signature(s) Signed: 03/22/2021 11:55:30 AM By: Geralyn Corwin DO Signed: 03/25/2021 4:58:30 PM By: Zandra Abts RN, BSN Previous Signature: 03/21/2021  1:23:11 PM Version By: Geralyn Corwin DO Previous Signature: 03/21/2021 5:58:36 PM Version By: Antonieta Iba Entered By: Zandra Abts on 03/22/2021 11:52:34 -------------------------------------------------------------------------------- HPI Details Patient Name: Date of Service: [name], [name] 03/21/2021 7:30 A M Medical Record Number: [id number] Patient Account Number: [id number] Date of Birth/Sex: Treating RN: [birth year] (55 y.o. Lucious Groves Primary Care Provider: Gayleen Orem, [facility] Other Clinician: Referring Provider: Treating Provider/Extender: Geralyn Corwin BRO WN, EMILY Weeks in Treatment: 0 History of Present Illness HPI Description: Admission 03/21/2021 Dream Riegel is  a 55 year old male with a past medical history of uncontrolled insulin-dependent type 2 diabetes complicated by peripheral neuropathy, coronary artery disease status post CABG x4 and osteomyelitis in the right foot that presents today for Right foot wound dehiscence status post fourth and fifth ray amputation secondary to osteomyelitis. In August 2022 patient had an MRI with evidence of osteomyelitis to his fourth and fifth toes. He subsequently had a ray amputation of this site by Dr. Ardelle Anton. Unfortunately the wound has dehisced and has not been healing for the past 6 weeks. He reports being on doxycycline for the past 2 months. He just recently started Santyl to the wound a few days ago. His most recent hemoglobin A1c is 9.9. He states he is going to try and see an endocrinologist. Of note he had an an abdominal aortogram with lower extremity runoff that showed stenosis in the right popliteal artery and posterior tibial artery status post balloon angioplasty. His most recent ABIs are 1.12 on the right and TBI 0.63. It is also noted that he had a right foot x-ray on 9/22 in Dr. Kenna Gilbert office that reports no definitive osteomyelitis. He currently denies systemic signs of infection. Electronic  Signature(s) Signed: 03/21/2021 1:23:11 PM By: Geralyn Corwin DO Entered By: Geralyn Corwin on 03/21/2021 09:14:31 -------------------------------------------------------------------------------- Physical Exam Details Patient Name: Date of Service: [name] 03/21/2021 7:30 A M Medical Record Number: [id number] Patient Account Number: [id number] Date of Birth/Sex: Treating RN: [birth year] (55 y.o. Charlean Merl, Lauren Primary Care Provider: Gayleen Orem, EMILY Other Clinician: Referring Provider: Treating Provider/Extender: Geralyn Corwin BRO WN, EMILY Weeks in Treatment: 0 Constitutional respirations regular, non-labored and within target range for patient.. Cardiovascular 2+ dorsalis pedis/posterior tibialis pulses. Psychiatric pleasant and cooperative. Notes Right foot: 2 separate wound areas along the incision site. T the most proximal aspect there is an open wound with slough and necrotic debris throughout. 3 o sutures still in place. There is a small open area distal to this also on the surgical site that has some granulation tissue present with necrotic debris and slough. Electronic Signature(s) Signed: 03/21/2021 1:23:11 PM By: Geralyn Corwin DO Entered By: Geralyn Corwin on 03/21/2021 09:16:45 -------------------------------------------------------------------------------- Physician Orders Details Patient Name: Date of Service: [name], [name] 03/21/2021 7:30 A M Medical Record Number: [id number] Patient Account Number: [id number] Date of Birth/Sex: Treating RN: November 19, 1965 (55 y.o. 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 - with Dr. Leanord Hawking (Friday) Return A ppointment in 2 weeks. - with Dr. Mikey Bussing Return A Bathing/ Shower/ Hygiene May shower and wash wound with soap and water. Edema Control - Lymphedema / SCD / Other Avoid standing for long periods of time. Off-Loading Open toe surgical shoe to: Additional Orders / Instructions Follow Nutritious Diet - Check blood sugar more frequently Wound Treatment Wound #1 - Amputation Site - Toe Wound Laterality: Right Cleanser: Soap and Water 1 x Per Day/30 Days Discharge Instructions: May shower and wash wound with dial antibacterial soap and water  prior to dressing change. Prim Dressing: Santyl Ointment 1 x Per Day/30 Days ary Discharge Instructions: Apply nickel thick amount to wound bed as instructed Secondary Dressing: Woven Gauze Sponge, Non-Sterile 4x4 in 1 x Per Day/30 Days Discharge Instructions: Apply over primary dressing as directed. Secondary Dressing: ABD Pad, 5x9 1 x Per Day/30 Days Discharge Instructions: Apply over primary dressing as directed. Secured With: Elastic Bandage 4 inch (ACE bandage) 1 x Per Day/30 Days Discharge Instructions: Secure with ACE bandage as directed. Secured With: [employer], 4.5x3.1 (in/yd) 1 x Per Day/30 Days Discharge Instructions: Secure with Kerlix as directed. Secured With: Transpore Surgical Tape, 2x10 (in/yd) 1 x Per Day/30 Days Discharge Instructions: Secure dressing with tape as directed. Wound #2 - Foot Wound Laterality: Right, Lateral Cleanser: Soap and Water 1 x Per Day/30 Days Discharge Instructions: May shower and wash wound with dial antibacterial soap and water prior  to dressing change. Prim Dressing: KerraCel Ag Gelling Fiber Dressing, 2x2 in (silver alginate) 1 x Per Day/30 Days ary Discharge Instructions: Apply silver alginate to wound bed as instructed Prim Dressing: Santyl Ointment 1 x Per Day/30 Days ary Discharge Instructions: Apply nickel thick amount to wound bed as instructed Secondary Dressing: Woven Gauze Sponge, Non-Sterile 4x4 in 1 x Per Day/30 Days Discharge Instructions: Apply over primary dressing as directed. Secondary Dressing: ABD Pad, 5x9 1 x Per Day/30 Days Discharge Instructions: Apply over primary dressing as directed. Secured With: Elastic Bandage 4 inch (ACE bandage) 1 x Per Day/30 Days Discharge Instructions: Secure with ACE bandage as directed. Secured With: [employer], 4.5x3.1 (in/yd) 1 x Per Day/30 Days Discharge Instructions: Secure with Kerlix as directed. Secured With: Transpore Surgical Tape, 2x10 (in/yd) 1 x Per Day/30 Days Discharge Instructions: Secure dressing with tape as directed. Electronic Signature(s) Signed: 03/21/2021 1:23:11 PM By: Geralyn Corwin DO Entered By: Geralyn Corwin on 03/21/2021 09:17:04 -------------------------------------------------------------------------------- Problem List Details Patient Name: Date of Service: [name], [place] BBY 03/21/2021 7:30 A M Medical Record Number: [id number] Patient Account Number: [id number] Date of Birth/Sex: Treating RN: 1965-12-12 (55 y.o. 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 03/21/2021 No Yes without evidence of necrosis T81.31XA Disruption of external operation (surgical)  wound, not elsewhere classified, 03/21/2021 No Yes initial encounter E11.42 Type 2 diabetes mellitus with diabetic polyneuropathy 03/21/2021 No Yes Z95.1 Presence of aortocoronary bypass graft 03/21/2021 No Yes M86.9 Osteomyelitis, unspecified 03/21/2021 No Yes Inactive Problems Resolved Problems Electronic Signature(s) Signed: 03/21/2021 1:23:11 PM By: Geralyn Corwin DO Entered By: Geralyn Corwin on 03/21/2021 09:01:48 -------------------------------------------------------------------------------- Progress Note Details Patient Name: Date of Service: [name], [name] 03/21/2021 7:30 A M Medical Record Number: [id number] Patient Account Number: [id number] Date of Birth/Sex: Treating RN: Mar 16, 1966 (55 y.o. Lucious Groves Primary Care Provider: Gayleen Orem, [facility] Other Clinician: Referring Provider: Treating Provider/Extender: Geralyn Corwin BRO WN, EMILY Weeks in Treatment: 0 Subjective Chief Complaint Information obtained from Patient Right foot wound dehiscence status post fourth and fifth ray amputation secondary to osteomyelitis History of Present Illness (HPI) Admission 03/21/2021 Dreyson Mishkin is a 55 year old male with a past medical history of uncontrolled insulin-dependent type 2 diabetes complicated by peripheral neuropathy, coronary artery disease status post CABG x4 and  osteomyelitis in the right foot that presents today for Right foot wound dehiscence status post fourth and fifth ray amputation secondary to osteomyelitis. In August 2022 patient had an MRI with evidence of osteomyelitis to his fourth and fifth toes. He subsequently had a ray amputation of this site by Dr. Ardelle Anton. Unfortunately the wound has dehisced and has not been healing for the past 6 weeks. He reports being on doxycycline for the past 2 months. He just recently started Santyl to the wound a few days ago. His most recent hemoglobin A1c is 9.9. He states he is going to try and see an  endocrinologist. Of note he had an an abdominal aortogram with lower extremity runoff that showed stenosis in the right popliteal artery and posterior tibial artery status post balloon angioplasty. His most recent ABIs are 1.12 on the right and TBI 0.63. It is also noted that he had a right foot x-ray on 9/22 in Dr. Kenna Gilbert office that reports no definitive osteomyelitis. He currently denies systemic signs of infection. Patient History Information obtained from Patient. Allergies atorvastatin (Severity: Moderate) Family History Diabetes - Mother,Father,Siblings, Heart Disease - Father, Hypertension - Father, Stroke - Father,Mother, No family history of Cancer, Hereditary Spherocytosis, Kidney Disease, Lung Disease, Seizures, Thyroid Problems, Tuberculosis. Social History Former smoker - Quit 12/24/20, Marital Status - Married, Alcohol Use - Rarely, Drug Use - No History, Caffeine Use - Daily - coffee. Medical History Cardiovascular Patient has history of Coronary Artery Disease, Hypertension Endocrine Patient has history of Type II Diabetes Musculoskeletal Patient has history of Osteoarthritis Neurologic Patient has history of Neuropathy Patient is treated with Insulin, Oral Agents. Blood sugar is tested. Medical A Surgical History Notes nd Eyes Retinopathy Cardiovascular Angioplasty 01/29/21 Review of Systems (ROS) Eyes Complains or has symptoms of Vision Changes, Glasses / Contacts. Ear/Nose/Mouth/Throat Denies complaints or symptoms of Chronic sinus problems or rhinitis. Respiratory Denies complaints or symptoms of Chronic or frequent coughs, Shortness of Breath. Gastrointestinal Denies complaints or symptoms of Frequent diarrhea, Nausea, Vomiting. Genitourinary Denies complaints or symptoms of Frequent urination. Integumentary (Skin) Complains or has symptoms of Wounds. Musculoskeletal Denies complaints or symptoms of Muscle Pain, Muscle Weakness. Psychiatric Denies  complaints or symptoms of Claustrophobia, Suicidal. Objective Constitutional respirations regular, non-labored and within target range for patient.. Vitals Time Taken: 7:46 AM, Height: 67 in, Source: Stated, Weight: 128 lbs, Source: Stated, BMI: 20, Temperature: 97.9 F, Pulse: 85 bpm, Respiratory Rate: 16 breaths/min, Blood Pressure: 169/90 mmHg. Cardiovascular 2+ dorsalis pedis/posterior tibialis pulses. Psychiatric pleasant and cooperative. General Notes: Right foot: 2 separate wound areas along the incision site. T the most proximal aspect there is an open wound with slough and necrotic debris o throughout. 3 sutures still in place. There is a small open area distal to this also on the surgical site that has some granulation tissue present with necrotic debris and slough. Integumentary (Hair, Skin) Wound #1 status is Open. Original cause of wound was Surgical Injury. The date acquired was: 02/06/2021. The wound is located on the Right Amputation Site - T The wound measures 4.8cm length x 1.5cm width x 0.9cm depth; 5.655cm^2 area and 5.089cm^3 volume. There is Fat Layer (Subcutaneous Tissue) oe. exposed. There is no tunneling or undermining noted. There is a medium amount of purulent drainage noted. The wound margin is distinct with the outline attached to the wound base. There is no granulation within the wound bed. There is a large (67-100%) amount of necrotic tissue within the wound bed including Eschar and Adherent  Slough. Wound #2 status is Open. Original cause of wound was Surgical Injury. The date acquired was: 02/06/2021. The wound is located on the Right,Lateral Foot. The wound measures 1.2cm length x 1.2cm width x 0.5cm depth; 1.131cm^2 area and 0.565cm^3 volume. There is Fat Layer (Subcutaneous Tissue) exposed. There is no tunneling or undermining noted. There is a medium amount of purulent drainage noted. The wound margin is distinct with the outline attached to the wound base.  There is no granulation within the wound bed. There is a large (67-100%) amount of necrotic tissue within the wound bed including Eschar. 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 Unfortunately the wound has dehisced and has not been healing for the past 6 weeks. He just recently started Santyl to the wound a few days ago. I recommended Santyl to the areas of black and yellow tissue and silver alginate to the area of granulation. We had a long discussion about the importance of glycemic control and wound healing. His recent hemoglobin A1c is 9.9. I explained to him that the wound will not heal if he does not get his blood glucose under control. He states he is going to try and see an endocrinologist. He states that he is going to see Dr. Ardelle Anton today who usually continues doxycycline. He has been on continuous doxycycline for the last 2 months. I am happy to continue this if needed. We discussed that he is at high risk for amputation. 49 minutes was spent on the encounter including face-to-face, EMR review and coordination of care Procedures Wound #1 Pre-procedure diagnosis of Wound #1 is a Dehisced Wound located on the Right Amputation Site - T . There was a Selective/Open Wound Non-Viable Tissue oe Debridement with a total area of 7.2 sq cm performed by Geralyn Corwin, DO. With the following instrument(s): Blade, and Forceps to remove Non-Viable tissue/material. Material removed includes Eschar and Slough and. No  specimens were taken. A time out was conducted at 08:34, prior to the start of the procedure. A Minimum amount of bleeding was controlled with Pressure. The procedure was tolerated well. Post Debridement Measurements: 4.8cm length x 1.5cm width x 0.9cm depth; 5.089cm^3 volume. Character of Wound/Ulcer Post Debridement is stable. Post procedure Diagnosis Wound #1: Same as Pre-Procedure Wound #2 Pre-procedure diagnosis of Wound #2 is a Dehisced Wound located on the Right,Lateral Foot . There was a Selective/Open Wound Non-Viable Tissue Debridement with a total area of 1.44 sq cm performed by Geralyn Corwin, DO. With the following instrument(s): Blade, and Forceps to remove Non-Viable tissue/material. Material removed includes Eschar and Slough and. No specimens were taken. A time out was conducted at 08:34, prior to the start of the procedure. A Minimum amount of bleeding was controlled with Pressure. The procedure was tolerated well. Post Debridement Measurements: 1.2cm length x 1.2cm width x 0.5cm depth; 0.565cm^3 volume. Character of Wound/Ulcer Post Debridement is stable. Post procedure Diagnosis Wound #2: Same as Pre-Procedure Plan Follow-up Appointments: Return Appointment in 1 week. - with Dr. Leanord Hawking (Friday) Return Appointment in 2 weeks. - with Dr. Mikey Bussing Bathing/ Shower/ Hygiene: May shower and wash wound with soap and water. Edema  Control - Lymphedema / SCD / Other: Avoid standing for long periods of time. Off-Loading: Open toe surgical shoe to: Additional Orders / Instructions: Follow Nutritious Diet - Check blood sugar more frequently WOUND #1: - Amputation Site - T oe Wound Laterality: Right Cleanser: Soap and Water 1 x Per Day/30 Days Discharge Instructions: May shower and wash wound with dial antibacterial soap and water prior to dressing change. Prim Dressing: Santyl Ointment 1 x Per Day/30 Days ary Discharge Instructions: Apply nickel thick amount to wound bed as  instructed Secondary Dressing: Woven Gauze Sponge, Non-Sterile 4x4 in 1 x Per Day/30 Days Discharge Instructions: Apply over primary dressing as directed. Secondary Dressing: ABD Pad, 5x9 1 x Per Day/30 Days Discharge Instructions: Apply over primary dressing as directed. Secured With: Elastic Bandage 4 inch (ACE bandage) 1 x Per Day/30 Days Discharge Instructions: Secure with ACE bandage as directed. Secured With: [employer], 4.5x3.1 (in/yd) 1 x Per Day/30 Days Discharge Instructions: Secure with Kerlix as directed. Secured With: Transpore Surgical T ape, 2x10 (in/yd) 1 x Per Day/30 Days Discharge Instructions: Secure dressing with tape as directed. WOUND #2: - Foot Wound Laterality: Right, Lateral Cleanser: Soap and Water 1 x Per Day/30 Days Discharge Instructions: May shower and wash wound with dial antibacterial soap and water prior to dressing change. Prim Dressing: KerraCel Ag Gelling Fiber Dressing, 2x2 in (silver alginate) 1 x Per Day/30 Days ary Discharge Instructions: Apply silver alginate to wound bed as instructed Prim Dressing: Santyl Ointment 1 x Per Day/30 Days ary Discharge Instructions: Apply nickel thick amount to wound bed as instructed Secondary Dressing: Woven Gauze Sponge, Non-Sterile 4x4 in 1 x Per Day/30 Days Discharge Instructions: Apply over primary dressing as directed. Secondary Dressing: ABD Pad, 5x9 1 x Per Day/30 Days Discharge Instructions: Apply over primary dressing as directed. Secured With: Elastic Bandage 4 inch (ACE bandage) 1 x Per Day/30 Days Discharge Instructions: Secure with ACE bandage as directed. Secured With: [employer], 4.5x3.1 (in/yd) 1 x Per Day/30 Days Discharge Instructions: Secure with Kerlix as directed. Secured With: Transpore Surgical T ape, 2x10 (in/yd) 1 x Per Day/30 Days Discharge Instructions: Secure dressing with tape as directed. 1. Needs better glycemic control -May need to follow with endocrinologist will  defer to PCP 2.Silver alginate and Santyl 3.Follow-up in 1 week 4. In office sharp debridement Electronic Signature(s) Signed: 03/22/2021 11:55:30 AM By: Geralyn Corwin DO Signed: 03/25/2021 4:58:30 PM By: Zandra Abts RN, BSN Previous Signature: 03/21/2021 1:23:11 PM Version By: Geralyn Corwin DO Entered By: Zandra Abts on 03/22/2021 11:53:00 -------------------------------------------------------------------------------- HxROS Details Patient Name: Date of Service: [name], [name] 03/21/2021 7:30 A M Medical Record Number: [id number] Patient Account Number: [id number] Date of Birth/Sex: Treating RN: 1966-03-16 (55 y.o. Lytle Michaels Primary Care Provider: Gayleen Orem, [facility] Other Clinician: Referring Provider: Treating Provider/Extender: Geralyn Corwin BRO WN, EMILY Weeks in Treatment: 0 Information Obtained From Patient Eyes Complaints and Symptoms: Positive for: Vision Changes; Glasses / Contacts Medical History: Past Medical History Notes: Retinopathy Ear/Nose/Mouth/Throat Complaints and Symptoms: Negative for: Chronic sinus problems or rhinitis Respiratory Complaints and Symptoms: Negative for: Chronic or frequent coughs; Shortness of Breath Gastrointestinal Complaints and Symptoms: Negative for: Frequent diarrhea; Nausea; Vomiting Genitourinary Complaints and Symptoms: Negative for: Frequent urination Integumentary (Skin) Complaints and Symptoms: Positive for: Wounds Musculoskeletal Complaints and Symptoms: Negative for: Muscle Pain; Muscle Weakness Medical History: Positive for: Osteoarthritis Psychiatric Complaints and Symptoms: Negative for: Claustrophobia; Suicidal Hematologic/Lymphatic Cardiovascular Medical History: Positive for: Coronary Artery Disease;  Hypertension Past Medical History Notes: Angioplasty 01/29/21 Endocrine Medical History: Positive for: Type II Diabetes Time with diabetes: 26 years Treated with: Insulin, Oral  agents Blood sugar tested every day: Yes Tested : 3x week Immunological Neurologic Medical History: Positive for: Neuropathy Oncologic Immunizations Pneumococcal Vaccine: Received Pneumococcal Vaccination: No Implantable Devices None Family and Social History Cancer: No; Diabetes: Yes - Mother,Father,Siblings; Heart Disease: Yes - Father; Hereditary Spherocytosis: No; Hypertension: Yes - Father; Kidney Disease: No; Lung Disease: No; Seizures: No; Stroke: Yes - Father,Mother; Thyroid Problems: No; Tuberculosis: No; Former smoker - Quit 12/24/20; Marital Status - Married; Alcohol Use: Rarely; Drug Use: No History; Caffeine Use: Daily - coffee; Financial Concerns: No; Food, Clothing or Shelter Needs: No; Support System Lacking: No; Transportation Concerns: No Electronic Signature(s) Signed: 03/21/2021 1:23:11 PM By: Geralyn Corwin DO Signed: 03/21/2021 5:58:36 PM By: Antonieta Iba Entered By: Antonieta Iba on 03/21/2021 07:57:44 -------------------------------------------------------------------------------- SuperBill Details Patient Name: Date of Service: [name], [name] 03/21/2021 Medical Record Number: [id number] Patient Account Number: [id number] Date of Birth/Sex: Treating RN: 1966/06/15 (55 y.o. 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 M86.9 Osteomyelitis, unspecified Facility Procedures CPT4 Code: 19622297 Description:  99213 - WOUND CARE VISIT-LEV 3 EST PT Modifier: 25 Quantity: 1 CPT4 Code: 98921194 Description: 97597 - DEBRIDE WOUND 1ST 20 SQ CM OR < ICD-10 Diagnosis Description E11.621 Type 2 diabetes mellitus with foot ulcer 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 with Modifier: out evidence of nec Quantity: 1 rosis Physician Procedures : CPT4 Code Description Modifier [id number] - WC PHYS LEVEL 4 - NEW PT ICD-10 Diagnosis Description 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 without evidence of necr E11.621 Type 2 diabetes mellitus with  foot ulcer T81.31XA Disruption of external operation (surgical) wound, not elsewhere classified, initial encounter M86.9 Osteomyelitis, unspecified Quantity: 1 osis : [id number] 97597 - WC PHYS DEBR WO ANESTH 20 SQ CM ICD-10 Diagnosis Description E11.621 Type 2 diabetes mellitus with foot ulcer 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

## 2021-03-21 NOTE — Progress Notes (Signed)
DARAY, POLGAR ([id number]) Visit Report for 03/21/2021 Abuse/Suicide Risk Screen Details Patient Name: Date of Service: [name] 03/21/2021 7:30 A M Medical Record Number: [id number] Patient Account Number: [id number] Date of Birth/Sex: Treating RN: 1965-11-26 (55 y.o. Lytle Michaels Primary Care Beryl Hornberger: [name], [facility] Other Clinician: Referring Maybelline Kolarik: Treating Debbie Yearick/Extender: Geralyn Corwin BRO WN, EMILY Weeks in Treatment: 0 Abuse/Suicide Risk Screen Items Answer ABUSE RISK SCREEN: Has anyone close to you tried to hurt or harm you recentlyo No Do you feel uncomfortable with anyone in your familyo No Has anyone forced you do things that you didnt want to doo No Electronic Signature(s) Signed: 03/21/2021 5:58:36 PM By: Antonieta Iba Entered By: Antonieta Iba on 03/21/2021 07:57:54 -------------------------------------------------------------------------------- Activities of Daily Living Details Patient Name: Date of Service: [name] BBY 03/21/2021 7:30 A M Medical Record Number: [id number] Patient Account Number: [id number] Date of Birth/Sex: Treating RN: [birth year] (55 y.o. Lytle Michaels Primary Care Harveen Flesch: [name], [facility] Other Clinician: Referring Keeva Reisen: Treating Janeli Lewison/Extender: Geralyn Corwin BRO WN, EMILY Weeks in Treatment: 0 Activities of Daily Living Items Answer Activities of Daily Living (Please select one for each item) Drive Automobile Not Able T Medications ake Completely Able Use T elephone Completely Able Care for Appearance Completely Able Use T oilet Completely Able Bath / Shower Completely Able Dress Self Completely Able Feed Self Completely Able Walk Completely Able Get In / Out Bed Completely Able Housework Completely Able Prepare Meals Completely Able Handle Money Completely Able Shop for Self Completely Able Electronic Signature(s) Signed: 03/21/2021 5:58:36 PM By: Antonieta Iba Entered By: Antonieta Iba on 03/21/2021 07:58:33 -------------------------------------------------------------------------------- Education Screening Details Patient Name: Date of Service: [name], [name] 03/21/2021 7:30 A M Medical Record Number: [id number] Patient Account Number: [id number] Date of Birth/Sex: Treating RN: August 17, 1965 (55 y.o. Lytle Michaels Primary Care Marelly Wehrman: [name], [facility] Other Clinician: Referring [place]ader: Treating Johnay Mano/Extender: Jule Ser WN, EMILY Weeks in Treatment: 0 Primary Learner Assessed: Patient Learning Preferences/Education Level/Primary Language Learning Preference: Explanation, Demonstration, Printed Material Highest Education Level: High School Preferred Language: English Cognitive Barrier Language Barrier: No Translator Needed: No Memory Deficit: No Emotional Barrier: No Cultural/Religious Beliefs Affecting Medical Care: No Physical Barrier Impaired Vision: Yes Glasses Impaired Hearing: No Decreased Hand dexterity: No Knowledge/Comprehension Knowledge Level: Medium Comprehension Level: Medium Ability to understand written instructions: Medium Ability to understand verbal instructions: Medium Motivation Anxiety Level: Calm Cooperation: Cooperative Education Importance: Acknowledges Need Interest in Health Problems: Asks Questions Perception: Coherent Willingness to Engage in Self-Management Medium Activities: Readiness to Engage in Self-Management Medium Activities: Electronic Signature(s) Signed: 03/21/2021 5:58:36 PM By: Antonieta Iba Entered By: Antonieta Iba on 03/21/2021 07:59:10 -------------------------------------------------------------------------------- Fall Risk Assessment Details Patient Name: Date of Service: [name], [name] 03/21/2021 7:30 A M Medical Record Number: [id number] Patient Account Number: [id number] Date of Birth/Sex: Treating RN: Apr 02, 1966 (54 y.o. Lytle Michaels Primary Care Lucinda Spells: [name], EMILY Other Clinician: Referring Liston Thum: Treating Nanami Whitelaw/Extender: Geralyn Corwin BRO WN, EMILY Weeks in Treatment: 0 Fall Risk Assessment Items Have you had 2 or more falls in the last 12 monthso 0 Yes Have you had any fall that resulted in injury in the last 12 monthso 0 No FALLS RISK SCREEN History of falling - immediate or within 3 months 25 Yes Secondary diagnosis (Do you have 2 or more medical diagnoseso) 15 Yes Ambulatory aid None/bed rest/wheelchair/nurse 0 Yes Crutches/cane/walker 0 No Furniture 0 No Intravenous therapy Access/Saline/Heparin Lock 0 No Gait/Transferring  Normal/ bed rest/ wheelchair 0 No Weak (short steps with or without shuffle, stooped but able to lift head while walking, may seek 10 Yes support from furniture) Impaired (short steps with shuffle, may have difficulty arising from chair, head down, impaired 0 No balance) Mental Status Oriented to own ability 0 Yes Electronic Signature(s) Signed: 03/21/2021 5:58:36 PM By: Antonieta Iba Entered By: Antonieta Iba on 03/21/2021 07:59:45 -------------------------------------------------------------------------------- Foot Assessment Details Patient Name: Date of Service: [name] BBY 03/21/2021 7:30 A M Medical Record Number: [id number] Patient Account Number: [id number] Date of Birth/Sex: Treating RN: 03/03/1966 (55 y.o. Lytle Michaels Primary Care Kieffer Blatz: [name], EMILY Other Clinician: Referring Alvena Kiernan: Treating Tera Pellicane/Extender: Geralyn Corwin BRO WN, EMILY Weeks in Treatment: 0 Foot Assessment Items Site Locations + = Sensation present, - = Sensation absent, C = Callus, U = Ulcer R = Redness, W = Warmth, M = Maceration, PU = Pre-ulcerative lesion F = Fissure, S = Swelling, D = Dryness Assessment Right: Left: Other Deformity: No No Prior Foot Ulcer: No No Prior Amputation: Yes No Charcot Joint: No No Ambulatory Status: Ambulatory Without Help Gait:  Steady Notes Amputation of 4th, 5th toes 01/29/21 Electronic Signature(s) Signed: 03/21/2021 5:58:36 PM By: Antonieta Iba Entered By: Antonieta Iba on 03/21/2021 08:04:59 -------------------------------------------------------------------------------- Nutrition Risk Screening Details Patient Name: Date of Service: [name] BBY 03/21/2021 7:30 A M Medical Record Number: [id number] Patient Account Number: [id number] Date of Birth/Sex: Treating RN: [birth year] (55 y.o. Lytle Michaels Primary Care Fausto Sampedro: [name], EMILY Other Clinician: Referring Sekai Gitlin: Treating [place]/Extender: Geralyn Corwin BRO WN, EMILY Weeks in Treatment: 0 Height (in): 67 Weight (lbs): 128 Body Mass Index (BMI): 20 Nutrition Risk Screening Items Score Screening NUTRITION RISK SCREEN: I have an illness or condition that made me change the kind and/or amount of food I eat 0 No I eat fewer than two meals per day 0 No I eat few fruits and vegetables, or milk products 0 No I have three or more drinks of beer, liquor or wine almost every day 0 No I have tooth or mouth problems that make it hard for me to eat 0 No I don't always have enough money to buy the food I need 0 No I eat alone most of the time 0 No I take three or more different prescribed or over-the-counter drugs a day 1 Yes Without wanting to, I have lost or gained 10 pounds in the last six months 0 No I am not always physically able to shop, cook and/or feed myself 0 No Nutrition Protocols Good Risk Protocol 0 No interventions needed Moderate Risk Protocol High Risk Proctocol Risk Level: Good Risk Score: 1 Electronic Signature(s) Signed: 03/21/2021 5:58:36 PM By: Antonieta Iba Entered By: Antonieta Iba on 03/21/2021 08:00:18

## 2021-03-21 NOTE — Progress Notes (Signed)
Subjective: [name] is a 55 y.o. is seen today in office s/p right partial fourth, fifth ray amputations preformed on 01/30/2021.  He did follow-up with wound care.  He has been continue with Santyl dressing changes on the wound.  Gets occasional discomfort.  No fevers or chills that he reports.  Has no other concerns today.  Objective: General: No acute distress, AAOx3  DP/PT pulses palpable Right foot: Dehiscence noted along the wound with fibrotic wound base.  A few sutures are still intact.  Treatment erythema without any ascending cellulitis.  No frank purulence noted.  No significant tenderness palpation.  No probing to bone. No other open lesions or pre-ulcerative lesions.  No pain with calf compression, swelling, warmth, erythema.   Assessment and Plan:  Status post right partial fourth, fifth ray amputation; dehiscence  -Treatment options discussed including all alternatives, risks, and complications -I remove the remainder of the sutures today.  Sharply debrided the wound today utilizing the curette as well as a 15 blade scalpel.  I was able to debride portion of the incision to bleeding tissue.  Wound continue with Santyl dressing changes daily for now.  Also discussed with him possible return to the operating room for debridement possible graft application.  I ordered blood work today including CBC, BMP, sed rate, CRP. -Continue doxycycline    Vivi Barrack DPM

## 2021-03-22 LAB — CBC WITH DIFFERENTIAL/PLATELET
Basophils Absolute: 0.1 10*3/uL (ref 0.0–0.2)
Basos: 1 %
EOS (ABSOLUTE): 0.4 10*3/uL (ref 0.0–0.4)
Eos: 5 %
Hematocrit: 40.3 % (ref 37.5–51.0)
Hemoglobin: 12.5 g/dL — ABNORMAL LOW (ref 13.0–17.7)
Immature Grans (Abs): 0 10*3/uL (ref 0.0–0.1)
Immature Granulocytes: 0 %
Lymphocytes Absolute: 1.5 10*3/uL (ref 0.7–3.1)
Lymphs: 18 %
MCH: 26.5 pg — ABNORMAL LOW (ref 26.6–33.0)
MCHC: 31 g/dL — ABNORMAL LOW (ref 31.5–35.7)
MCV: 85 fL (ref 79–97)
Monocytes Absolute: 0.8 10*3/uL (ref 0.1–0.9)
Monocytes: 9 %
Neutrophils Absolute: 5.5 10*3/uL (ref 1.4–7.0)
Neutrophils: 67 %
Platelets: 275 10*3/uL (ref 150–450)
RBC: 4.72 x10E6/uL (ref 4.14–5.80)
RDW: 15.1 % (ref 11.6–15.4)
WBC: 8.3 10*3/uL (ref 3.4–10.8)

## 2021-03-22 LAB — BASIC METABOLIC PANEL
BUN/Creatinine Ratio: 25 — ABNORMAL HIGH (ref 9–20)
BUN: 21 mg/dL (ref 6–24)
CO2: 23 mmol/L (ref 20–29)
Calcium: 10.2 mg/dL (ref 8.7–10.2)
Chloride: 101 mmol/L (ref 96–106)
Creatinine, Ser: 0.85 mg/dL (ref 0.76–1.27)
Glucose: 318 mg/dL — ABNORMAL HIGH (ref 70–99)
Potassium: 5.4 mmol/L — ABNORMAL HIGH (ref 3.5–5.2)
Sodium: 137 mmol/L (ref 134–144)
eGFR: 103 mL/min/{1.73_m2} (ref >59)

## 2021-03-22 LAB — SEDIMENTATION RATE: Sed Rate: 18 mm/hr (ref 0–30)

## 2021-03-22 LAB — C-REACTIVE PROTEIN: CRP: 11 mg/L — ABNORMAL HIGH (ref 0–10)

## 2021-03-22 NOTE — Telephone Encounter (Signed)
Called [facility]) and they will reorder the 4 x 4 gauzes for the patient.  Called and informed the patient's wife, verbalized understanding.
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
## 2021-03-28 ENCOUNTER — Other Ambulatory Visit: Payer: Self-pay

## 2021-03-28 ENCOUNTER — Ambulatory Visit (INDEPENDENT_AMBULATORY_CARE_PROVIDER_SITE_OTHER): Payer: Medicaid Other | Admitting: Podiatry

## 2021-03-28 DIAGNOSIS — I739 Peripheral vascular disease, unspecified: Secondary | ICD-10-CM

## 2021-03-28 DIAGNOSIS — Z9889 Other specified postprocedural states: Secondary | ICD-10-CM

## 2021-03-28 DIAGNOSIS — L97512 Non-pressure chronic ulcer of other part of right foot with fat layer exposed: Secondary | ICD-10-CM

## 2021-03-28 MED ORDER — DOXYCYCLINE HYCLATE 100 MG PO TABS: 100.0000 mg | ORAL_TABLET | Freq: Two times a day (BID) | ORAL | 0 refills | Status: AC

## 2021-03-29 ENCOUNTER — Encounter (HOSPITAL_BASED_OUTPATIENT_CLINIC_OR_DEPARTMENT_OTHER): Payer: Medicaid Other | Admitting: Internal Medicine

## 2021-03-29 DIAGNOSIS — T8189XA Other complications of procedures, not elsewhere classified, initial encounter: Secondary | ICD-10-CM | POA: Diagnosis not present

## 2021-04-01 NOTE — Progress Notes (Signed)
CASHIS, RILL ([id number]) Visit Report for 03/29/2021 Debridement Details Patient Name: Date of Service: [name] BBY 03/29/2021 10:30 A M Medical Record Number: [id number] Patient Account Number: [id number] Date of Birth/Sex: Treating RN: 04/08/1966 (55 y.o. [name] Primary Care Provider: Heber [place] Other Clinician: Referring Provider: Treating Provider/Extender: Thompson Caul in Treatment: 1 Debridement Performed for Assessment: Wound #1 Right Amputation Site - Toe Performed By: Physician Ricard Dillon., MD Debridement Type: Debridement Level of Consciousness (Pre-procedure): Awake and Alert Pre-procedure Verification/Time Out Yes - 11:19 Taken: Start Time: 11:20 Pain Control: Other : Benzocaine T Area Debrided (L x W): otal 4.5 (cm) x 1.5 (cm) = 6.75 (cm) Tissue and other material debrided: Non-Viable, Eschar, Slough, Subcutaneous, Slough Level: Skin/Subcutaneous Tissue Debridement Description: Excisional Instrument: Curette Bleeding: Minimum Hemostasis Achieved: Pressure End Time: 11:25 Response to Treatment: Procedure was tolerated well Level of Consciousness (Post- Awake and Alert procedure): Post Debridement Measurements of Total Wound Length: (cm) 4.5 Width: (cm) 1.5 Depth: (cm) 0.9 Volume: (cm) 4.771 Character of Wound/Ulcer Post Debridement: Stable Post Procedure Diagnosis Same as Pre-procedure Electronic Signature(s) Signed: 03/29/2021 12:39:46 PM By: Linton Ham MD Signed: 04/01/2021 5:48:05 PM By: Baruch Gouty RN, BSN Entered By: Linton Ham on 03/29/2021 11:55:24 -------------------------------------------------------------------------------- Debridement Details Patient Name: Date of Service: [name], [name] BBY 03/29/2021 10:30 A M Medical Record Number: [id number] Patient Account Number: [id number] Date of Birth/Sex: Treating RN: 12-09-1965 (55 y.o. [name] Primary Care Provider:  Heber [place] Other Clinician: Referring Provider: Treating Provider/Extender: Thompson Caul in Treatment: 1 Debridement Performed for Assessment: Wound #2 Right,Lateral Foot Performed By: Physician Ricard Dillon., MD Debridement Type: Debridement Level of Consciousness (Pre-procedure): Awake and Alert Pre-procedure Verification/Time Out Yes - 11:19 Yes - 11:19 Taken: Start Time: 11:25 Pain Control: Other : Benzocaine T Area Debrided (L x W): otal 0.9 (cm) x 1.2 (cm) = 1.08 (cm) Tissue and other material debrided: Non-Viable, Eschar, Slough, Subcutaneous, Slough Level: Skin/Subcutaneous Tissue Debridement Description: Excisional Instrument: Curette Bleeding: Minimum Hemostasis Achieved: Pressure End Time: 11:30 Response to Treatment: Procedure was tolerated well Level of Consciousness (Post- Awake and Alert procedure): Post Debridement Measurements of Total Wound Length: (cm) 0.9 Width: (cm) 1.2 Depth: (cm) 0.3 Volume: (cm) 0.254 Character of Wound/Ulcer Post Debridement: Stable Post Procedure Diagnosis Same as Pre-procedure Electronic Signature(s) Signed: 03/29/2021 12:39:46 PM By: Linton Ham MD Signed: 04/01/2021 5:48:05 PM By: Baruch Gouty RN, BSN Entered By: Linton Ham on 03/29/2021 11:55:42 -------------------------------------------------------------------------------- HPI Details Patient Name: Date of Service: [name], [name] BBY 03/29/2021 10:30 A M Medical Record Number: [id number] Patient Account Number: [id number] Date of Birth/Sex: Treating RN: Apr 20, 1966 (55 y.o. [name] Primary Care Provider: Heber [place] Other Clinician: Referring Provider: Treating Provider/Extender: Thompson Caul in Treatment: 1 History of Present Illness HPI Description: Admission 03/21/2021 [name] is a 55 year old male with a past medical history of uncontrolled insulin-dependent type 2 diabetes  complicated by peripheral neuropathy, coronary artery disease status post CABG x4 and osteomyelitis in the right foot that presents today for Right foot wound dehiscence status post fourth and fifth ray amputation secondary to osteomyelitis. In August 2022 patient had an MRI with evidence of osteomyelitis to his fourth and fifth toes. He subsequently had a ray amputation of this site by Dr. Jacqualyn Posey. Unfortunately the wound has dehisced and has not been healing for the past 6 weeks. He reports being on doxycycline for the past 2 months. He just recently  started Santyl to the wound a few days ago. His most recent hemoglobin A1c is 9.9. He states he is going to try and see an endocrinologist. Of note he had an an abdominal aortogram with lower extremity runoff that showed stenosis in the right popliteal artery and posterior tibial artery status post balloon angioplasty. His most recent ABIs are 1.12 on the right and TBI 0.63. It is also noted that he had a right foot x-ray on 9/22 in Dr. Pasty Arch office that reports no definitive osteomyelitis. He currently denies systemic signs of infection. 03/29/2021; second visit for this patient who has a nonhealing surgical wound on the right foot from a ray amputation of his fourth and fifth digits. This was done by Dr. Earleen Newport of [facility]. He is still on antibiotics according to the patient I am not sure which one. He also has known PAD status post interventions however his most recent testing is fairly good. Most recent imaging and podiatry showed no osteomyelitis. He has 2 nonviable surfaces the larger 1 on the dorsal fourth/fifth toes spreading proximally. He also has an area dorsally over the fifth met head. Both of these covered in 100% nonviable surface. Minimal amount of surrounding erythema Electronic Signature(s) Signed: 03/29/2021 12:39:46 PM By: Linton Ham MD Entered By: Linton Ham on 03/29/2021  11:57:32 -------------------------------------------------------------------------------- Physical Exam Details Patient Name: Date of Service: [name], [name] BBY 03/29/2021 10:30 A M Medical Record Number: [id number] Patient Account Number: [id number] Date of Birth/Sex: Treating RN: Mar 24, 1966 (55 y.o. [name] Primary Care Provider: Heber [place] Other Clinician: Referring Provider: Treating Provider/Extender: Thompson Caul in Treatment: 1 Constitutional Patient is hypotensive.. Pulse regular and within target range for patient.Marland Kitchen Respirations regular, non-labored and within target range.. Temperature is normal and within the target range for the patient.Marland Kitchen Appears in no distress. Notes Wound exam; fourth fifth ray amputation site wound and a second area over the dorsal fifth met head. Both of these necrotic I used a #5 curette to aggressively remove very adherent necrotic tissue. Even with this I am not able to get down to completely viable tissue. This does not probe to bone however these will turn out to be deep wounds. Hemostasis with direct pressure Electronic Signature(s) Signed: 03/29/2021 12:39:46 PM By: Linton Ham MD Entered By: Linton Ham on 03/29/2021 11:58:43 -------------------------------------------------------------------------------- Physician Orders Details Patient Name: Date of Service: [name], [name] BBY 03/29/2021 10:30 A M Medical Record Number: [id number] Patient Account Number: [id number] Date of Birth/Sex: Treating RN: 04-25-1966 (55 y.o. Marcheta Grammes Primary Care Provider: Heber [place] Other Clinician: Referring Provider: Treating Provider/Extender: Thompson Caul in Treatment: 1 Verbal / Phone Orders: No Diagnosis Coding Follow-up Appointments ppointment in 1 week. - requested Dr. Dellia Nims Return A Bathing/ Shower/ Hygiene May shower and wash wound with soap and water. Edema Control -  Lymphedema / SCD / Other Avoid standing for long periods of time. Off-Loading Open toe surgical shoe to: Additional Orders / Instructions Follow Nutritious Diet - Check blood sugar more frequently Wound Treatment Wound #1 - Amputation Site - Toe Wound Laterality: Right Cleanser: Soap and Water 1 x Per Day/30 Days Discharge Instructions: May shower and wash wound with dial antibacterial soap and water prior to dressing change. Prim Dressing: KerraCel Ag Gelling Fiber Dressing, 4x5 in (silver alginate) 1 x Per Day/30 Days ary Discharge Instructions: Apply silver alginate to wound bed as instructed Prim Dressing: Santyl Ointment 1 x Per Day/30 Days  ary Discharge Instructions: Apply nickel thick amount to wound bed as instructed Secondary Dressing: Woven Gauze Sponge, Non-Sterile 4x4 in 1 x Per Day/30 Days Discharge Instructions: Apply over primary dressing as directed. Secondary Dressing: ABD Pad, 5x9 1 x Per Day/30 Days Discharge Instructions: Apply over primary dressing as directed. Secured With: Elastic Bandage 4 inch (ACE bandage) 1 x Per Day/30 Days Discharge Instructions: Secure with ACE bandage as directed. Secured With: [employer], 4.5x3.1 (in/yd) 1 x Per Day/30 Days Discharge Instructions: Secure with Kerlix as directed. Secured With: Transpore Surgical Tape, 2x10 (in/yd) 1 x Per Day/30 Days Discharge Instructions: Secure dressing with tape as directed. Wound #2 - Foot Wound Laterality: Right, Lateral Cleanser: Soap and Water 1 x Per Day/30 Days Discharge Instructions: May shower and wash wound with dial antibacterial soap and water prior to dressing change. Prim Dressing: KerraCel Ag Gelling Fiber Dressing, 2x2 in (silver alginate) 1 x Per Day/30 Days ary Discharge Instructions: Apply silver alginate to wound bed as instructed Prim Dressing: Santyl Ointment 1 x Per Day/30 Days ary Discharge Instructions: Apply nickel thick amount to wound bed as instructed Secondary  Dressing: Woven Gauze Sponge, Non-Sterile 4x4 in 1 x Per Day/30 Days Discharge Instructions: Apply over primary dressing as directed. Secondary Dressing: ABD Pad, 5x9 1 x Per Day/30 Days Discharge Instructions: Apply over primary dressing as directed. Secured With: Elastic Bandage 4 inch (ACE bandage) 1 x Per Day/30 Days Discharge Instructions: Secure with ACE bandage as directed. Secured With: [employer], 4.5x3.1 (in/yd) 1 x Per Day/30 Days Discharge Instructions: Secure with Kerlix as directed. Secured With: Transpore Surgical Tape, 2x10 (in/yd) 1 x Per Day/30 Days Discharge Instructions: Secure dressing with tape as directed. Electronic Signature(s) Signed: 03/29/2021 11:45:43 AM By: Lorrin Jackson Signed: 03/29/2021 12:39:46 PM By: Linton Ham MD Entered By: Lorrin Jackson on 03/29/2021 11:45:42 -------------------------------------------------------------------------------- Problem List Details Patient Name: Date of Service: [name], [place] BBY 03/29/2021 10:30 A M Medical Record Number: [id number] Patient Account Number: [id number] Date of Birth/Sex: Treating RN: 1966/03/08 (55 y.o. 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 03/21/2021 No Yes without evidence of necrosis T81.31XA Disruption of external operation (surgical) wound, not elsewhere classified, 03/21/2021 No Yes initial encounter E11.42 Type 2 diabetes mellitus with diabetic polyneuropathy 03/21/2021 No Yes Z95.1 Presence of aortocoronary bypass graft 03/21/2021 No Yes M86.9 Osteomyelitis, unspecified 03/21/2021 No Yes Inactive Problems Resolved  Problems Electronic Signature(s) Signed: 03/29/2021 12:39:46 PM By: Linton Ham MD Entered By: Linton Ham on 03/29/2021 11:54:55 -------------------------------------------------------------------------------- Progress Note Details Patient Name: Date of Service: [name], [name] BBY 03/29/2021 10:30 A M Medical Record Number: [id number] Patient Account Number: [id number] Date of Birth/Sex: Treating RN: April 06, 1966 (55 y.o. [name] Primary Care Provider: Heber [place] Other Clinician: Referring Provider: Treating Provider/Extender: Thompson Caul in Treatment: 1 Subjective History of Present Illness (HPI) Admission 03/21/2021 Javad Salva is a 55 year old male with a past medical history of uncontrolled insulin-dependent type 2 diabetes complicated by peripheral neuropathy, coronary artery disease status post CABG x4 and osteomyelitis in the right foot that presents today for Right foot wound dehiscence status post fourth and fifth ray amputation secondary to osteomyelitis. In August 2022 patient had an MRI  with evidence of osteomyelitis to his fourth and fifth toes. He subsequently had a ray amputation of this site by Dr. Jacqualyn Posey. Unfortunately the wound has dehisced and has not been healing for the past 6 weeks. He reports being on doxycycline for the past 2 months. He just recently started Santyl to the wound a few days ago. His most recent hemoglobin A1c is 9.9. He states he is going to try and see an endocrinologist. Of note he had an an abdominal aortogram with lower extremity runoff that showed stenosis in the right popliteal artery and posterior tibial artery status post balloon angioplasty. His most recent ABIs are 1.12 on the right and TBI 0.63. It is also noted that he had a right foot x-ray on 9/22 in Dr. Pasty Arch office that reports no definitive osteomyelitis. He currently denies systemic signs of infection. 03/29/2021; second visit for this  patient who has a nonhealing surgical wound on the right foot from a ray amputation of his fourth and fifth digits. This was done by Dr. Earleen Newport of [facility]. He is still on antibiotics according to the patient I am not sure which one. He also has known PAD status post interventions however his most recent testing is fairly good. Most recent imaging and podiatry showed no osteomyelitis. He has 2 nonviable surfaces the larger 1 on the dorsal fourth/fifth toes spreading proximally. He also has an area dorsally over the fifth met head. Both of these covered in 100% nonviable surface. Minimal amount of surrounding erythema Objective Constitutional Patient is hypotensive.. Pulse regular and within target range for patient.Marland Kitchen Respirations regular, non-labored and within target range.. Temperature is normal and within the target range for the patient.Marland Kitchen Appears in no distress. Vitals Time Taken: 10:46 AM, Height: 67 in, Weight: 128 lbs, BMI: 20, Temperature: 97.7 F, Pulse: 92 bpm, Respiratory Rate: 16 breaths/min, Blood Pressure: 97/65 mmHg, Capillary Blood Glucose: 165 mg/dl. General Notes: Wound exam; fourth fifth ray amputation site wound and a second area over the dorsal fifth met head. Both of these necrotic I used a #5 curette to aggressively remove very adherent necrotic tissue. Even with this I am not able to get down to completely viable tissue. This does not probe to bone however these will turn out to be deep wounds. Hemostasis with direct pressure Integumentary (Hair, Skin) Wound #1 status is Open. Original cause of wound was Surgical Injury. The date acquired was: 02/06/2021. The wound has been in treatment 1 weeks. The wound is located on the Right Amputation Site - T The wound measures 4.5cm length x 1.5cm width x 0.9cm depth; 5.301cm^2 area and 4.771cm^3 volume. oe. There is Fat Layer (Subcutaneous Tissue) exposed. There is no tunneling or undermining noted. There is a medium  amount of purulent drainage noted. The wound margin is distinct with the outline attached to the wound base. There is small (1-33%) red granulation within the wound bed. There is a large (67-100%) amount of necrotic tissue within the wound bed including Eschar and Adherent Slough. Wound #2 status is Open. Original cause of wound was Surgical Injury. The date acquired was: 02/06/2021. The wound has been in treatment 1 weeks. The wound is located on the Right,Lateral Foot. The wound measures 0.9cm length x 1.2cm width x 0.3cm depth; 0.848cm^2 area and 0.254cm^3 volume. There is Fat Layer (Subcutaneous Tissue) exposed. There is no tunneling or undermining noted. There is a medium amount of purulent drainage noted. The wound margin is distinct with the outline attached  to the wound base. There is no granulation within the wound bed. There is a large (67-100%) amount of necrotic tissue within the wound bed including Eschar and Adherent Slough. 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 There was a Excisional Skin/Subcutaneous Tissue oe Debridement with a total area of 6.75 sq cm performed by Ricard Dillon., MD. With the following instrument(s): Curette to remove Non-Viable tissue/material. Material removed includes Eschar, Subcutaneous Tissue, and Slough after achieving pain control using Other (Benzocaine). No specimens were taken. A time out was conducted at 11:19, prior to the start of the procedure. A Minimum amount of bleeding  was controlled with Pressure. The procedure was tolerated well. Post Debridement Measurements: 4.5cm length x 1.5cm width x 0.9cm depth; 4.771cm^3 volume. Character of Wound/Ulcer Post Debridement is stable. Post procedure Diagnosis Wound #1: Same as Pre-Procedure Wound #2 Pre-procedure diagnosis of Wound #2 is a Dehisced Wound located on the Right,Lateral Foot . There was a Excisional Skin/Subcutaneous Tissue Debridement with a total area of 1.08 sq cm performed by Ricard Dillon., MD. With the following instrument(s): Curette to remove Non-Viable tissue/material. Material removed includes Eschar, Subcutaneous Tissue, and Slough after achieving pain control using Other (Benzocaine). No specimens were taken. A time out was conducted at 11:19, prior to the start of the procedure. A Minimum amount of bleeding was controlled with Pressure. The procedure was tolerated well. Post Debridement Measurements: 0.9cm length x 1.2cm width x 0.3cm depth; 0.254cm^3 volume. Character of Wound/Ulcer Post Debridement is stable. Post procedure Diagnosis Wound #2: Same as Pre-Procedure Plan Follow-up Appointments: Return Appointment in 1 week. - requested Dr. Ar[place]a Jansky Shower/ Hygiene: May shower and wash wound with soap and water. Edema Control - Lymphedema / SCD / Other: Avoid standing for long periods of time. Off-Loading: Open toe surgical shoe to: Additional Orders / Instructions: Follow Nutritious Diet - Check blood sugar more frequently WOUND #1: - Amputation Site - T oe Wound Laterality: Right Cleanser: Soap and Water 1 x Per Day/30 Days Discharge Instructions: May shower and wash wound with dial antibacterial soap and water prior to dressing change. Prim Dressing: KerraCel Ag Gelling Fiber Dressing, 4x5 in (silver alginate) 1 x Per Day/30 Days ary Discharge Instructions: Apply silver alginate to wound bed as instructed Prim Dressing: Santyl Ointment 1 x Per Day/30 Days ary Discharge  Instructions: Apply nickel thick amount to wound bed as instructed Secondary Dressing: Woven Gauze Sponge, Non-Sterile 4x4 in 1 x Per Day/30 Days Discharge Instructions: Apply over primary dressing as directed. Secondary Dressing: ABD Pad, 5x9 1 x Per Day/30 Days Discharge Instructions: Apply over primary dressing as directed. Secured With: Elastic Bandage 4 inch (ACE bandage) 1 x Per Day/30 Days Discharge Instructions: Secure with ACE bandage as directed. Secured With: [employer], 4.5x3.1 (in/yd) 1 x Per Day/30 Days Discharge Instructions: Secure with Kerlix as directed. Secured With: Transpore Surgical T ape, 2x10 (in/yd) 1 x Per Day/30 Days Discharge Instructions: Secure dressing with tape as directed. WOUND #2: - Foot Wound Laterality: Right, Lateral Cleanser: Soap and Water 1 x Per Day/30 Days Discharge Instructions: May shower and wash wound  with dial antibacterial soap and water prior to dressing change. Prim Dressing: KerraCel Ag Gelling Fiber Dressing, 2x2 in (silver alginate) 1 x Per Day/30 Days ary Discharge Instructions: Apply silver alginate to wound bed as instructed Prim Dressing: Santyl Ointment 1 x Per Day/30 Days ary Discharge Instructions: Apply nickel thick amount to wound bed as instructed Secondary Dressing: Woven Gauze Sponge, Non-Sterile 4x4 in 1 x Per Day/30 Days Discharge Instructions: Apply over primary dressing as directed. Secondary Dressing: ABD Pad, 5x9 1 x Per Day/30 Days Discharge Instructions: Apply over primary dressing as directed. Secured With: Elastic Bandage 4 inch (ACE bandage) 1 x Per Day/30 Days Discharge Instructions: Secure with ACE bandage as directed. Secured With: [employer], 4.5x3.1 (in/yd) 1 x Per Day/30 Days Discharge Instructions: Secure with Kerlix as directed. Secured With: Transpore Surgical T ape, 2x10 (in/yd) 1 x Per Day/30 Days Discharge Instructions: Secure dressing with tape as directed. #1 I have continued  with the Santyl Hydrofera Blue 2. Likely to require further debridements status post revascularization he does not seem to have much recommended issue and is noninvasive testing was quite good. 3. If we can get the surface of this wound to clean up from a debris point of view he would probably be a good candidate for Apligraf 4. I have not reviewed his records to see if there is felt to be residual osteomyelitis status post surgery. 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 without evidence of necrosis T81.31XA Disruption of external operation (surgical) wound, not elsewhere classified, initial encounter E11.42 Type 2 diabetes mellitus with diabetic polyneuropathy Z95.1 Presence of aortocoronary bypass graft M86.9 Osteomyelitis, unspecified Facility Procedures Physician Procedures : CPT4 Code Description Modifier [id number] - WC PHYS SUBQ TISS 20 SQ CM ICD-10 Diagnosis Description 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

## 2021-04-01 NOTE — Progress Notes (Signed)
Subjective: [name] is a 55 y.o. is seen today in office s/p right partial fourth, fifth ray amputations preformed on 01/30/2021.  They have been using Santyl to the wound that feels is getting somewhat better.  No drainage or pus.  After I trimmed the last appointment he said he had pain afterwards.  No fevers or chills.  No other concerns.   Objective: General: No acute distress, AAOx3  DP/PT pulses palpable Right foot: Dehiscence noted along the wound with fibrotic wound base.  Although fibrotic tissue still present there is some new granulation tissue present within the wound bed.  There is no probing to bone or exposed bone or tendon.  Rim of erythema without any ascending cellulitis.  There is no fluctuation or crepitation.  No significant malodor. No pain with calf compression, swelling, warmth, erythema.   Assessment and Plan:  Status post right partial fourth, fifth ray amputation; dehiscence  -Treatment options discussed including all alternatives, risks, and complications -I discussed with the return to the operating room for debridement, partial third ray amputation versus TMA.  The patient does not want proceed with any further amputation.  He had to continue local wound care for now and see if this helps.  Discussed with him the longer the wound stays open the higher chance of infection.  For now he wants to continue local wound care and hold off on surgical intervention.  Keep a very close eye on this.  Continue with Santyl dressing changes daily, offloading, elevation.  He has a follow-up with wound care tomorrow as well. -Monitor for any clinical signs or symptoms of infection and directed to call the office immediately should any occur or go to the ER.  Return in about 1 week (around 04/04/2021).  Vivi Barrack DPM

## 2021-04-02 ENCOUNTER — Encounter (HOSPITAL_BASED_OUTPATIENT_CLINIC_OR_DEPARTMENT_OTHER): Payer: Medicaid Other | Admitting: Internal Medicine

## 2021-04-02 ENCOUNTER — Other Ambulatory Visit: Payer: Self-pay

## 2021-04-02 ENCOUNTER — Inpatient Hospital Stay (HOSPITAL_COMMUNITY)
Admission: EM | Admit: 2021-04-02 | Discharge: 2021-04-16 | DRG: 871 | Disposition: E | Payer: Medicaid Other | Attending: Internal Medicine | Admitting: Internal Medicine

## 2021-04-02 ENCOUNTER — Emergency Department (HOSPITAL_COMMUNITY): Payer: Medicaid Other

## 2021-04-02 ENCOUNTER — Encounter (HOSPITAL_COMMUNITY): Payer: Self-pay

## 2021-04-02 DIAGNOSIS — F419 Anxiety disorder, unspecified: Secondary | ICD-10-CM | POA: Diagnosis present

## 2021-04-02 DIAGNOSIS — L97518 Non-pressure chronic ulcer of other part of right foot with other specified severity: Secondary | ICD-10-CM | POA: Diagnosis present

## 2021-04-02 DIAGNOSIS — Z833 Family history of diabetes mellitus: Secondary | ICD-10-CM

## 2021-04-02 DIAGNOSIS — I471 Supraventricular tachycardia: Secondary | ICD-10-CM | POA: Diagnosis not present

## 2021-04-02 DIAGNOSIS — D61818 Other pancytopenia: Secondary | ICD-10-CM | POA: Diagnosis present

## 2021-04-02 DIAGNOSIS — E785 Hyperlipidemia, unspecified: Secondary | ICD-10-CM | POA: Diagnosis present

## 2021-04-02 DIAGNOSIS — J1282 Pneumonia due to coronavirus disease 2019: Secondary | ICD-10-CM | POA: Diagnosis present

## 2021-04-02 DIAGNOSIS — E11621 Type 2 diabetes mellitus with foot ulcer: Secondary | ICD-10-CM | POA: Diagnosis present

## 2021-04-02 DIAGNOSIS — U071 COVID-19: Secondary | ICD-10-CM

## 2021-04-02 DIAGNOSIS — A4189 Other specified sepsis: Secondary | ICD-10-CM | POA: Diagnosis present

## 2021-04-02 DIAGNOSIS — Z28311 Partially vaccinated for covid-19: Secondary | ICD-10-CM

## 2021-04-02 DIAGNOSIS — A419 Sepsis, unspecified organism: Secondary | ICD-10-CM | POA: Diagnosis not present

## 2021-04-02 DIAGNOSIS — I469 Cardiac arrest, cause unspecified: Secondary | ICD-10-CM | POA: Diagnosis not present

## 2021-04-02 DIAGNOSIS — G9341 Metabolic encephalopathy: Secondary | ICD-10-CM | POA: Diagnosis present

## 2021-04-02 DIAGNOSIS — R410 Disorientation, unspecified: Secondary | ICD-10-CM | POA: Diagnosis present

## 2021-04-02 DIAGNOSIS — Z951 Presence of aortocoronary bypass graft: Secondary | ICD-10-CM

## 2021-04-02 DIAGNOSIS — I10 Essential (primary) hypertension: Secondary | ICD-10-CM | POA: Diagnosis present

## 2021-04-02 DIAGNOSIS — J189 Pneumonia, unspecified organism: Secondary | ICD-10-CM | POA: Diagnosis not present

## 2021-04-02 DIAGNOSIS — R579 Shock, unspecified: Secondary | ICD-10-CM | POA: Diagnosis not present

## 2021-04-02 DIAGNOSIS — E1169 Type 2 diabetes mellitus with other specified complication: Secondary | ICD-10-CM | POA: Diagnosis present

## 2021-04-02 DIAGNOSIS — J80 Acute respiratory distress syndrome: Secondary | ICD-10-CM

## 2021-04-02 DIAGNOSIS — Z7902 Long term (current) use of antithrombotics/antiplatelets: Secondary | ICD-10-CM | POA: Diagnosis not present

## 2021-04-02 DIAGNOSIS — Z79899 Other long term (current) drug therapy: Secondary | ICD-10-CM

## 2021-04-02 DIAGNOSIS — L03119 Cellulitis of unspecified part of limb: Secondary | ICD-10-CM

## 2021-04-02 DIAGNOSIS — I959 Hypotension, unspecified: Secondary | ICD-10-CM | POA: Diagnosis not present

## 2021-04-02 DIAGNOSIS — Z87891 Personal history of nicotine dependence: Secondary | ICD-10-CM

## 2021-04-02 DIAGNOSIS — I4891 Unspecified atrial fibrillation: Secondary | ICD-10-CM | POA: Diagnosis not present

## 2021-04-02 DIAGNOSIS — J9601 Acute respiratory failure with hypoxia: Secondary | ICD-10-CM | POA: Diagnosis not present

## 2021-04-02 DIAGNOSIS — Z7984 Long term (current) use of oral hypoglycemic drugs: Secondary | ICD-10-CM

## 2021-04-02 DIAGNOSIS — E1140 Type 2 diabetes mellitus with diabetic neuropathy, unspecified: Secondary | ICD-10-CM | POA: Diagnosis present

## 2021-04-02 DIAGNOSIS — Z8614 Personal history of Methicillin resistant Staphylococcus aureus infection: Secondary | ICD-10-CM | POA: Diagnosis not present

## 2021-04-02 DIAGNOSIS — R6521 Severe sepsis with septic shock: Secondary | ICD-10-CM | POA: Diagnosis present

## 2021-04-02 DIAGNOSIS — Z7982 Long term (current) use of aspirin: Secondary | ICD-10-CM

## 2021-04-02 DIAGNOSIS — F32A Depression, unspecified: Secondary | ICD-10-CM | POA: Diagnosis present

## 2021-04-02 DIAGNOSIS — I2510 Atherosclerotic heart disease of native coronary artery without angina pectoris: Secondary | ICD-10-CM | POA: Diagnosis present

## 2021-04-02 DIAGNOSIS — R0602 Shortness of breath: Secondary | ICD-10-CM

## 2021-04-02 DIAGNOSIS — Z823 Family history of stroke: Secondary | ICD-10-CM

## 2021-04-02 DIAGNOSIS — I4901 Ventricular fibrillation: Secondary | ICD-10-CM | POA: Diagnosis not present

## 2021-04-02 DIAGNOSIS — L97512 Non-pressure chronic ulcer of other part of right foot with fat layer exposed: Secondary | ICD-10-CM | POA: Diagnosis not present

## 2021-04-02 DIAGNOSIS — Z978 Presence of other specified devices: Secondary | ICD-10-CM

## 2021-04-02 DIAGNOSIS — J188 Other pneumonia, unspecified organism: Secondary | ICD-10-CM

## 2021-04-02 DIAGNOSIS — Z9289 Personal history of other medical treatment: Secondary | ICD-10-CM

## 2021-04-02 DIAGNOSIS — Z89431 Acquired absence of right foot: Secondary | ICD-10-CM

## 2021-04-02 DIAGNOSIS — E11649 Type 2 diabetes mellitus with hypoglycemia without coma: Secondary | ICD-10-CM | POA: Diagnosis not present

## 2021-04-02 DIAGNOSIS — R54 Age-related physical debility: Secondary | ICD-10-CM | POA: Diagnosis present

## 2021-04-02 DIAGNOSIS — Z452 Encounter for adjustment and management of vascular access device: Secondary | ICD-10-CM

## 2021-04-02 DIAGNOSIS — E876 Hypokalemia: Secondary | ICD-10-CM | POA: Diagnosis not present

## 2021-04-02 DIAGNOSIS — Z4659 Encounter for fitting and adjustment of other gastrointestinal appliance and device: Secondary | ICD-10-CM

## 2021-04-02 DIAGNOSIS — E162 Hypoglycemia, unspecified: Secondary | ICD-10-CM | POA: Diagnosis not present

## 2021-04-02 LAB — COMPREHENSIVE METABOLIC PANEL
ALT: 20 U/L (ref 0–44)
AST: 25 U/L (ref 15–41)
Albumin: 2.8 g/dL — ABNORMAL LOW (ref 3.5–5.0)
Alkaline Phosphatase: 146 U/L — ABNORMAL HIGH (ref 38–126)
Anion gap: 17 — ABNORMAL HIGH (ref 5–15)
BUN: 22 mg/dL — ABNORMAL HIGH (ref 6–20)
CO2: 18 mmol/L — ABNORMAL LOW (ref 22–32)
Calcium: 8.5 mg/dL — ABNORMAL LOW (ref 8.9–10.3)
Chloride: 102 mmol/L (ref 98–111)
Creatinine, Ser: 1.23 mg/dL (ref 0.61–1.24)
GFR, Estimated: >60 mL/min (ref >60)
Glucose, Bld: 307 mg/dL — ABNORMAL HIGH (ref 70–99)
Potassium: 3.6 mmol/L (ref 3.5–5.1)
Sodium: 137 mmol/L (ref 135–145)
Total Bilirubin: 0.8 mg/dL (ref 0.3–1.2)
Total Protein: 6.6 g/dL (ref 6.5–8.1)

## 2021-04-02 LAB — I-STAT VENOUS BLOOD GAS, ED
Acid-base deficit: 8 mmol/L — ABNORMAL HIGH (ref 0.0–2.0)
Bicarbonate: 18.3 mmol/L — ABNORMAL LOW (ref 20.0–28.0)
Calcium, Ion: 1.12 mmol/L — ABNORMAL LOW (ref 1.15–1.40)
HCT: 38 % — ABNORMAL LOW (ref 39.0–52.0)
Hemoglobin: 12.9 g/dL — ABNORMAL LOW (ref 13.0–17.0)
O2 Saturation: 78 %
Potassium: 3.6 mmol/L (ref 3.5–5.1)
Sodium: 138 mmol/L (ref 135–145)
TCO2: 19 mmol/L — ABNORMAL LOW (ref 22–32)
pCO2, Ven: 38.8 mmHg — ABNORMAL LOW (ref 44.0–60.0)
pH, Ven: 7.282 (ref 7.250–7.430)
pO2, Ven: 47 mmHg — ABNORMAL HIGH (ref 32.0–45.0)

## 2021-04-02 LAB — I-STAT ARTERIAL BLOOD GAS, ED
Acid-base deficit: 1 mmol/L (ref 0.0–2.0)
Bicarbonate: 21.7 mmol/L (ref 20.0–28.0)
Calcium, Ion: 1.2 mmol/L (ref 1.15–1.40)
HCT: 45 % (ref 39.0–52.0)
Hemoglobin: 15.3 g/dL (ref 13.0–17.0)
O2 Saturation: 94 %
Patient temperature: 101.5
Potassium: 2.9 mmol/L — ABNORMAL LOW (ref 3.5–5.1)
Sodium: 139 mmol/L (ref 135–145)
TCO2: 23 mmol/L (ref 22–32)
pCO2 arterial: 32.2 mmHg (ref 32.0–48.0)
pH, Arterial: 7.442 (ref 7.350–7.450)
pO2, Arterial: 72 mmHg — ABNORMAL LOW (ref 83.0–108.0)

## 2021-04-02 LAB — CBC WITH DIFFERENTIAL/PLATELET
Abs Immature Granulocytes: 0 10*3/uL (ref 0.00–0.07)
Basophils Absolute: 0 10*3/uL (ref 0.0–0.1)
Basophils Relative: 0 %
Eosinophils Absolute: 0 10*3/uL (ref 0.0–0.5)
Eosinophils Relative: 0 %
HCT: 36.9 % — ABNORMAL LOW (ref 39.0–52.0)
Hemoglobin: 11.8 g/dL — ABNORMAL LOW (ref 13.0–17.0)
Lymphocytes Relative: 8 %
Lymphs Abs: 0.4 10*3/uL — ABNORMAL LOW (ref 0.7–4.0)
MCH: 26.5 pg (ref 26.0–34.0)
MCHC: 32 g/dL (ref 30.0–36.0)
MCV: 82.9 fL (ref 80.0–100.0)
Monocytes Absolute: 0.2 10*3/uL (ref 0.1–1.0)
Monocytes Relative: 3 %
Neutro Abs: 4.9 10*3/uL (ref 1.7–7.7)
Neutrophils Relative %: 89 %
Platelets: 221 10*3/uL (ref 150–400)
RBC: 4.45 MIL/uL (ref 4.22–5.81)
RDW: 15.4 % (ref 11.5–15.5)
WBC: 5.5 10*3/uL (ref 4.0–10.5)
nRBC: 0 % (ref 0.0–0.2)
nRBC: 0 /100 WBC

## 2021-04-02 LAB — PROTIME-INR
INR: 1.1 (ref 0.8–1.2)
Prothrombin Time: 14.2 seconds (ref 11.4–15.2)

## 2021-04-02 LAB — HEMOGLOBIN A1C
Hgb A1c MFr Bld: 8.3 % — ABNORMAL HIGH (ref 4.8–5.6)
Mean Plasma Glucose: 191.51 mg/dL

## 2021-04-02 LAB — CBG MONITORING, ED
Glucose-Capillary: 234 mg/dL — ABNORMAL HIGH (ref 70–99)
Glucose-Capillary: 144 mg/dL — ABNORMAL HIGH (ref 70–99)

## 2021-04-02 LAB — URINALYSIS, ROUTINE W REFLEX MICROSCOPIC
Bacteria, UA: NONE SEEN
Bilirubin Urine: NEGATIVE
Glucose, UA: 150 mg/dL — AB
Hgb urine dipstick: NEGATIVE
Ketones, ur: 20 mg/dL — AB
Leukocytes,Ua: NEGATIVE
Nitrite: NEGATIVE
Protein, ur: 30 mg/dL — AB
Specific Gravity, Urine: 1.027 (ref 1.005–1.030)
pH: 5 (ref 5.0–8.0)

## 2021-04-02 LAB — AMMONIA: Ammonia: <10 umol/L (ref 9–35)

## 2021-04-02 LAB — MRSA NEXT GEN BY PCR, NASAL: MRSA by PCR Next Gen: DETECTED — AB

## 2021-04-02 LAB — RESP PANEL BY RT-PCR (FLU A&B, COVID) ARPGX2
Influenza A by PCR: NEGATIVE
Influenza B by PCR: NEGATIVE
SARS Coronavirus 2 by RT PCR: POSITIVE — AB

## 2021-04-02 LAB — LACTIC ACID, PLASMA
Lactic Acid, Venous: 5.3 mmol/L (ref 0.5–1.9)
Lactic Acid, Venous: 2.8 mmol/L (ref 0.5–1.9)

## 2021-04-02 LAB — APTT: aPTT: 23 seconds — ABNORMAL LOW (ref 24–36)

## 2021-04-02 LAB — TROPONIN I (HIGH SENSITIVITY)
Troponin I (High Sensitivity): 9 ng/L (ref <18)
Troponin I (High Sensitivity): 11 ng/L (ref <18)

## 2021-04-02 LAB — PROCALCITONIN: Procalcitonin: 22.84 ng/mL

## 2021-04-02 IMAGING — DX DG CHEST 1V
1 series · 1 of 1 positions shown · non-contrast
Comparison: [DATE].

CLINICAL DATA: Hypotension [DF] ([DF]-CM)

Questionable sepsis.
EXAM:
CHEST  1 VIEW

[chest ap]
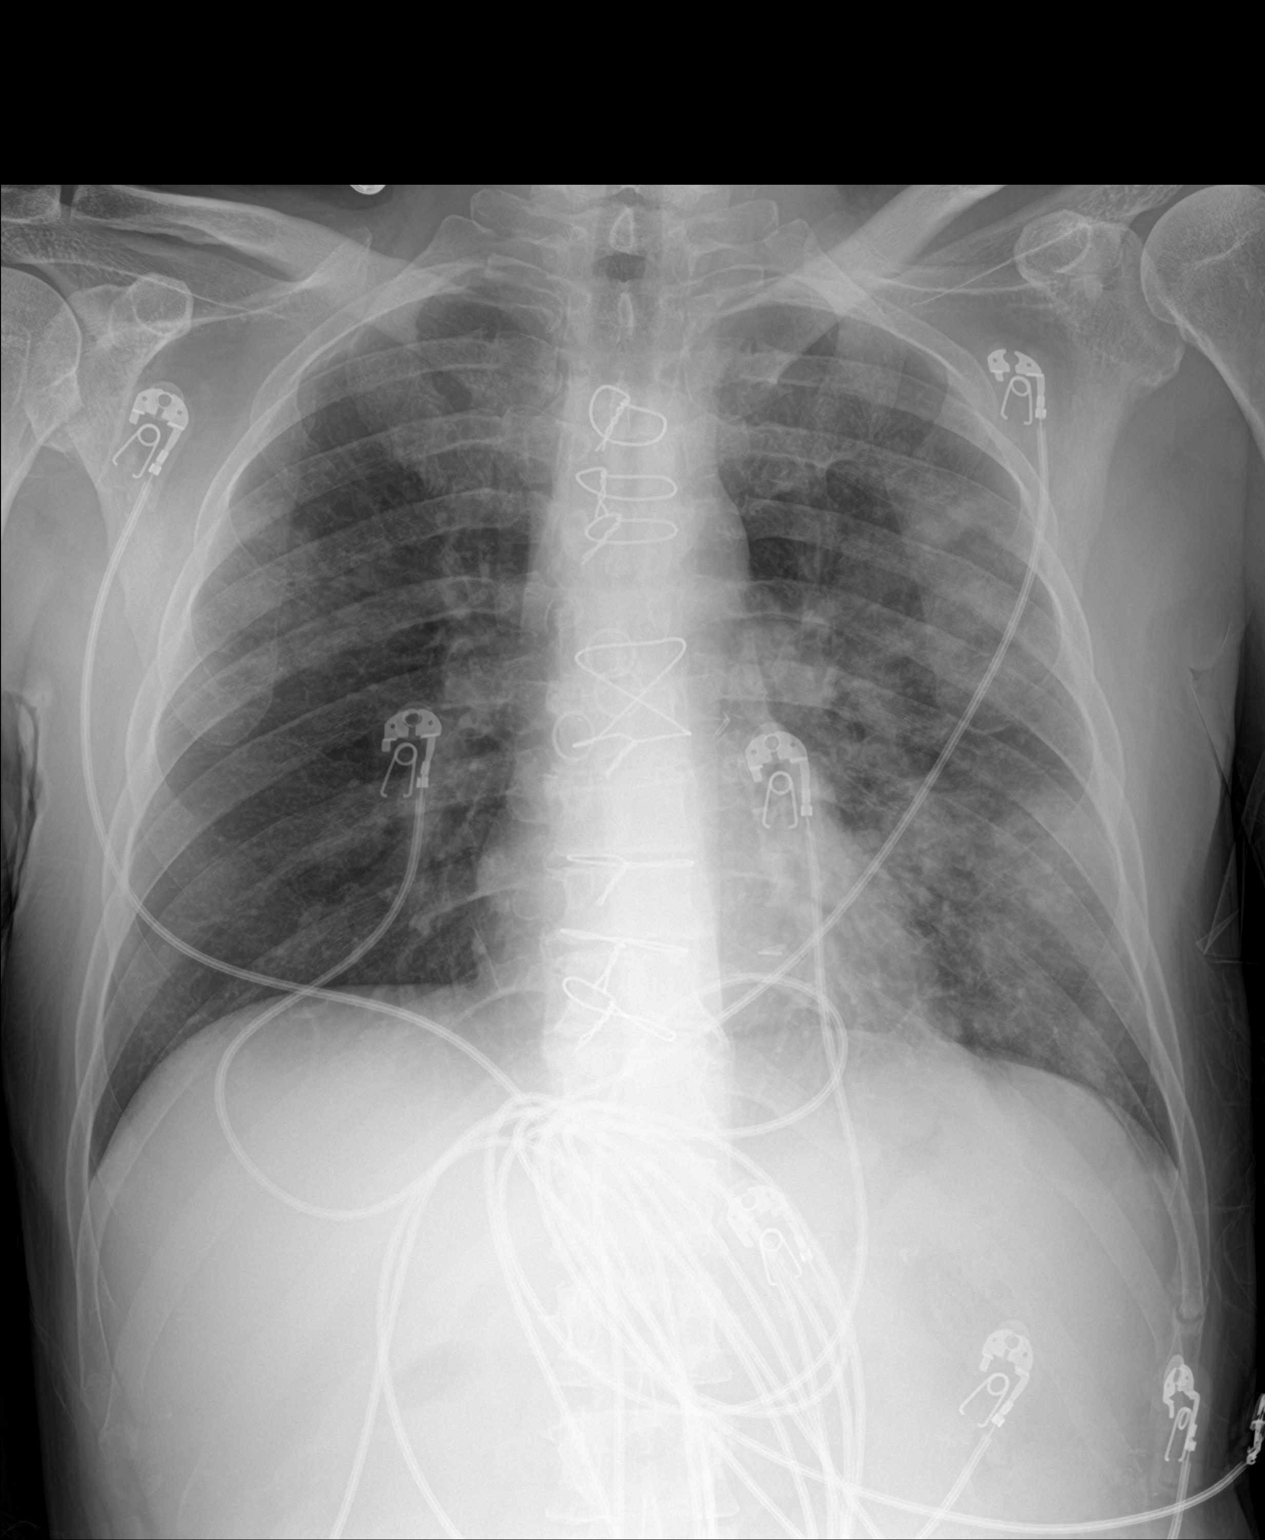

[1 of 1 positions shown; findings below may reference images not displayed]

FINDINGS: Patchy airspace opacities throughout the left lung and in the right
mid to upper lung, compatible with multifocal pneumonia. No visible
pneumothorax or pleural effusion. Cardiomediastinal silhouette is
within normal limits. CABG and median sternotomy. No acute osseous
abnormality.
IMPRESSION: Patchy airspace opacities throughout the left lung and in the right
mid to upper lung, compatible with multifocal pneumonia.

## 2021-04-02 IMAGING — CT CT HEAD W/O CM
4 series · 17 of 47 positions shown, 19 images · non-contrast
Comparison: Brain MRI [DATE], CT head [DATE]

CLINICAL DATA: Delirium

EXAM:
CT HEAD WITHOUT CONTRAST
TECHNIQUE: Contiguous axial images were obtained from the base of the skull
through the vertex without intravenous contrast.

[Series 3: head without · axial · non-contrast · 0.39mm/px · z∈[-62,+53]mm · 7 of 31 slices shown, 9 images]
[im 4/31  brain]
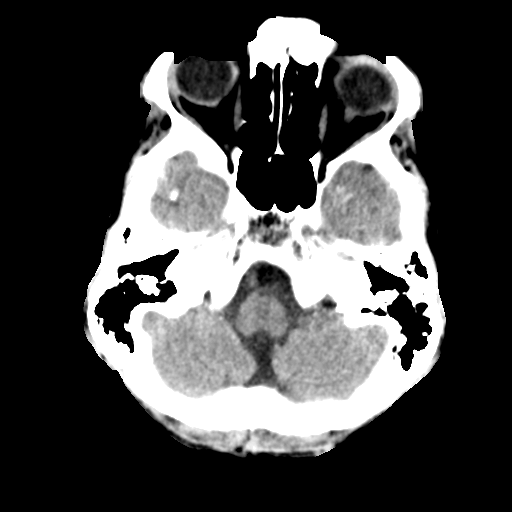
[im 4/31  bone]
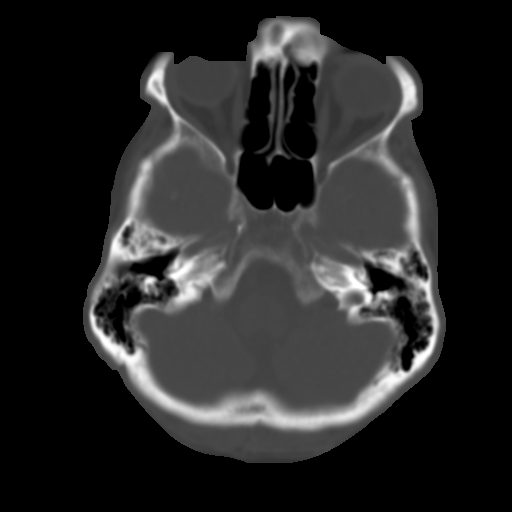
[im 8/31  brain]
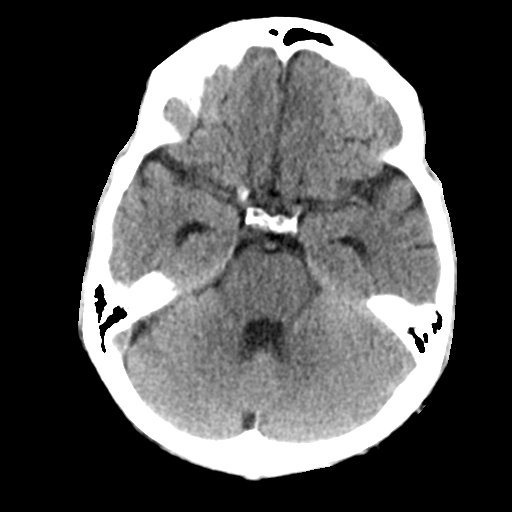
[im 12/31  brain]
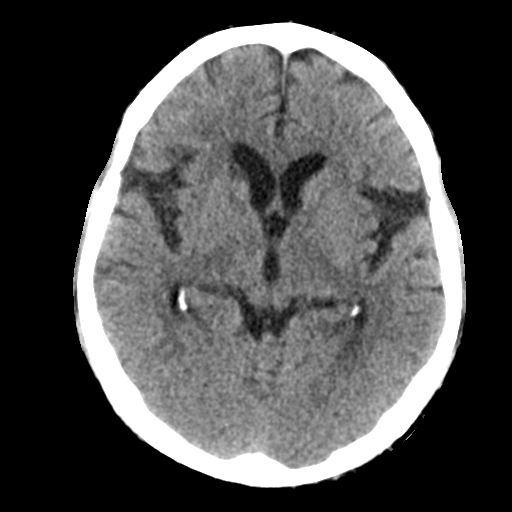
[im 16/31  brain]
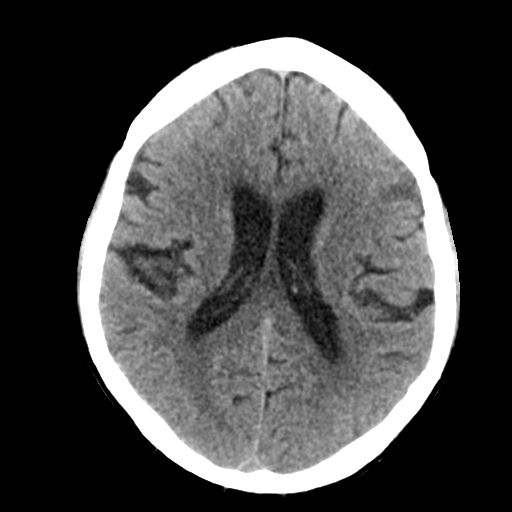
[im 19/31  brain]
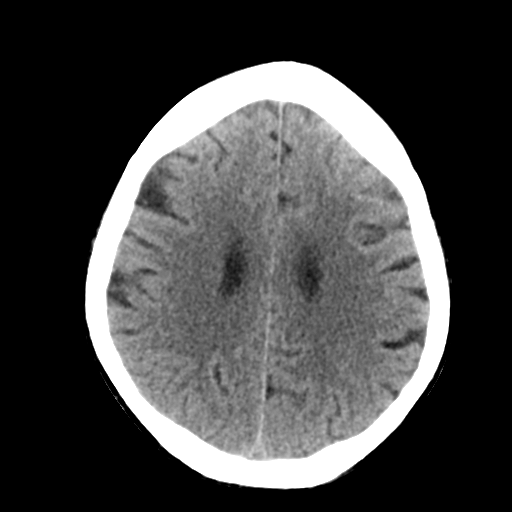
[im 19/31  bone]
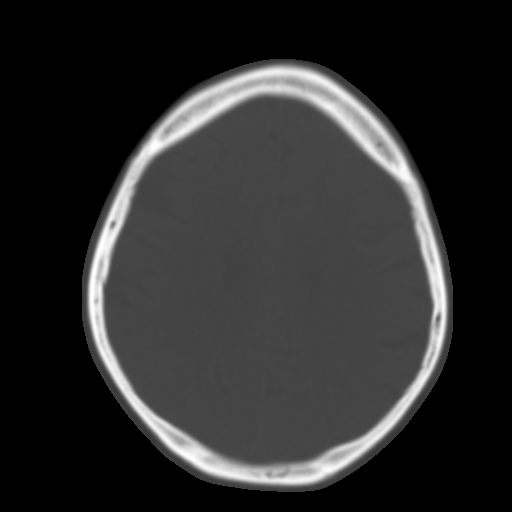
[im 23/31  brain]
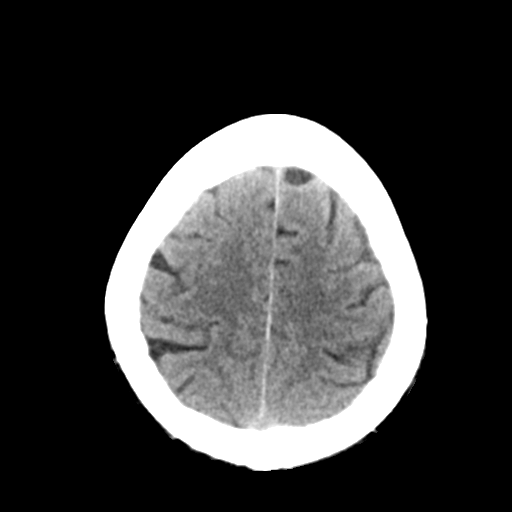
[im 27/31  brain]
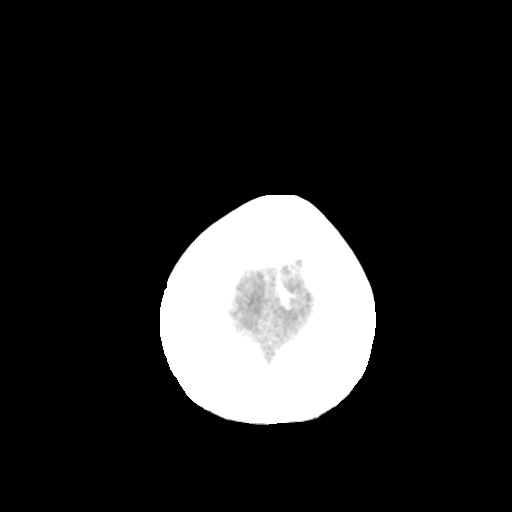

[Series 4: head bone · axial · 0.39mm/px · z∈[-63,-11]mm · 4 of 76 slices shown]
[im 8/76  bone]
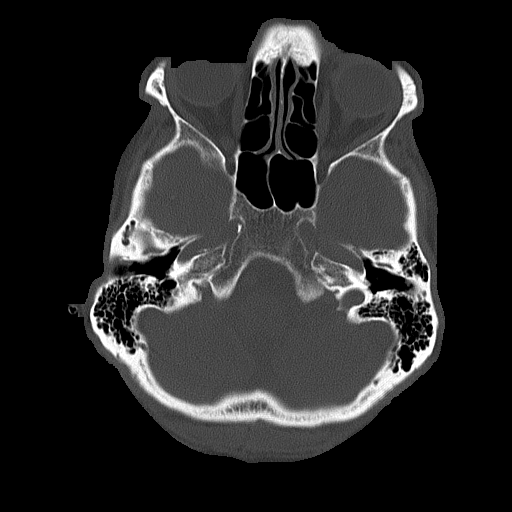
[im 16/76  bone]
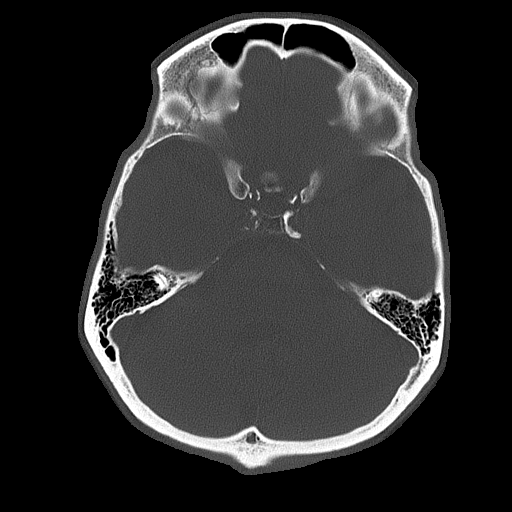
[im 23/76  bone]
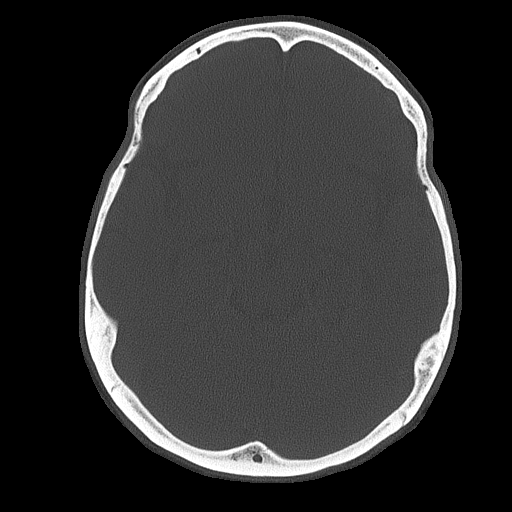
[im 34/76  bone]
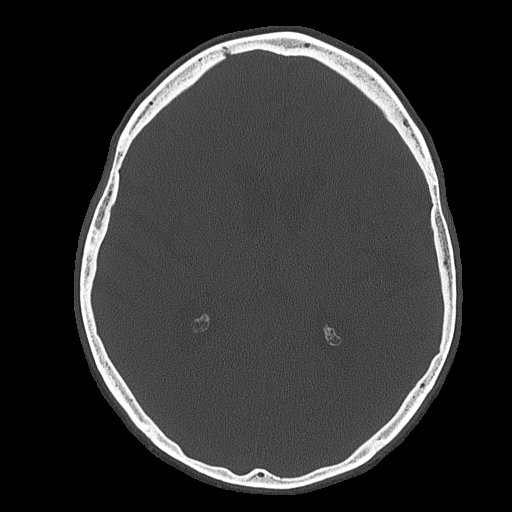

[Series 5: head without cor · coronal · non-contrast · 0.35mm/px · 3 of 63 slices shown]
[im 21/63  brain]
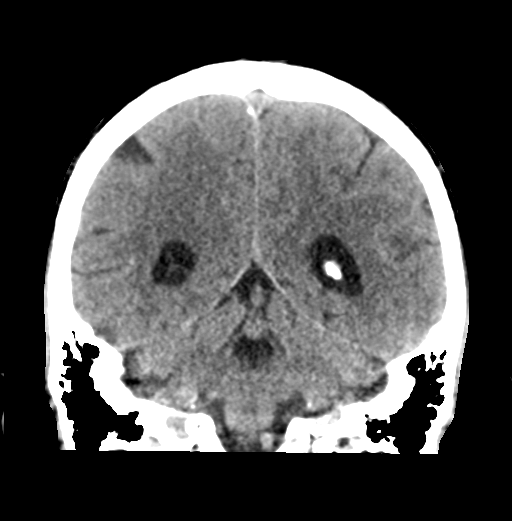
[im 28/63  brain]
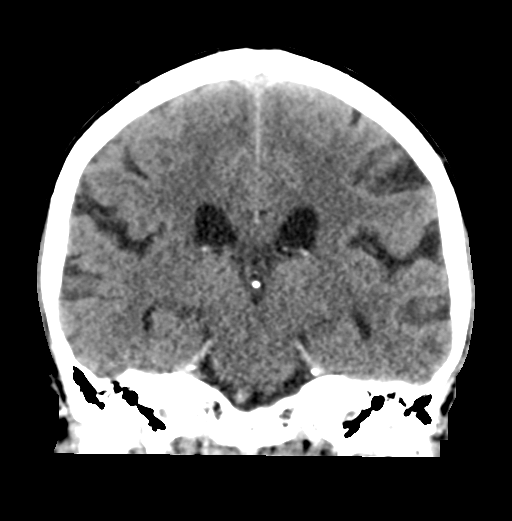
[im 35/63  brain]
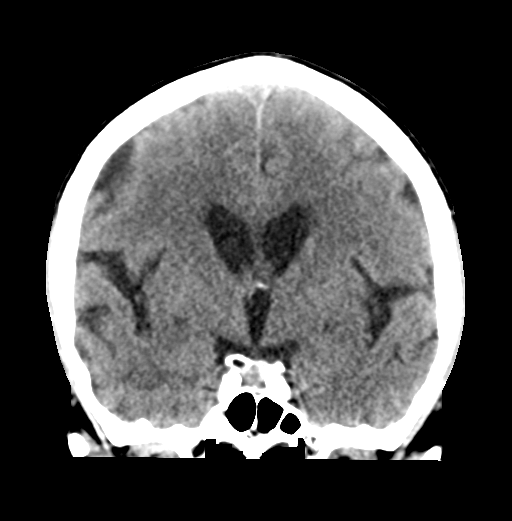

[Series 6: head without sag · sagittal · non-contrast · 0.34mm/px · 3 of 54 slices shown]
[im 18/54  brain]
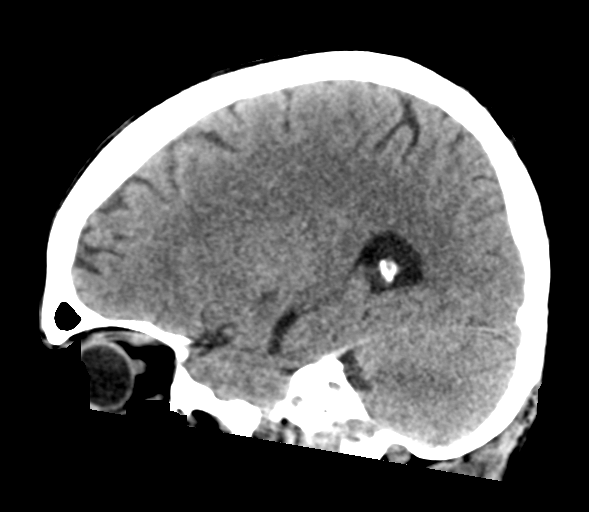
[im 27/54  brain]
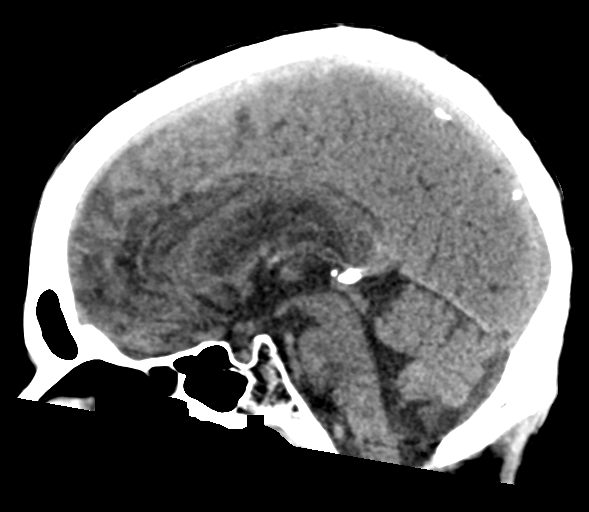
[im 36/54  brain]
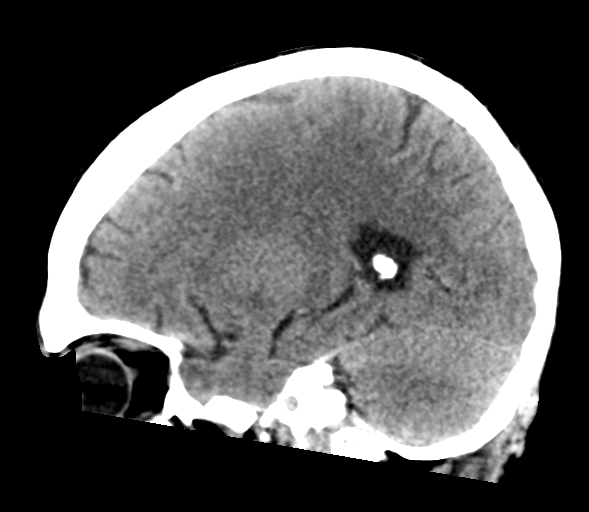

[17 of 47 positions shown; findings below may reference images not displayed]

FINDINGS: Brain: There is no acute intracranial hemorrhage, extra-axial fluid
collection, or acute infarct.

Parenchymal volume is normal.  The ventricles are stable in size.

There is no mass lesion.  There is no midline shift

Vascular: No hyperdense vessel or unexpected calcification.

Skull: Normal. Negative for fracture or focal lesion.

Sinuses/Orbits: Imaged paranasal sinuses are clear. The globes and
orbits are unremarkable.

Other: None.
IMPRESSION: No acute intracranial pathology.

## 2021-04-02 IMAGING — DX DG FOOT 2V*R*
2 series · 2 of 2 positions shown · non-contrast
Comparison: Foot radiographs [DATE]

CLINICAL DATA: Status post amputation of fourth and fifth toes,
possible sepsis

EXAM:
RIGHT FOOT - 2 VIEW

[foot ap]
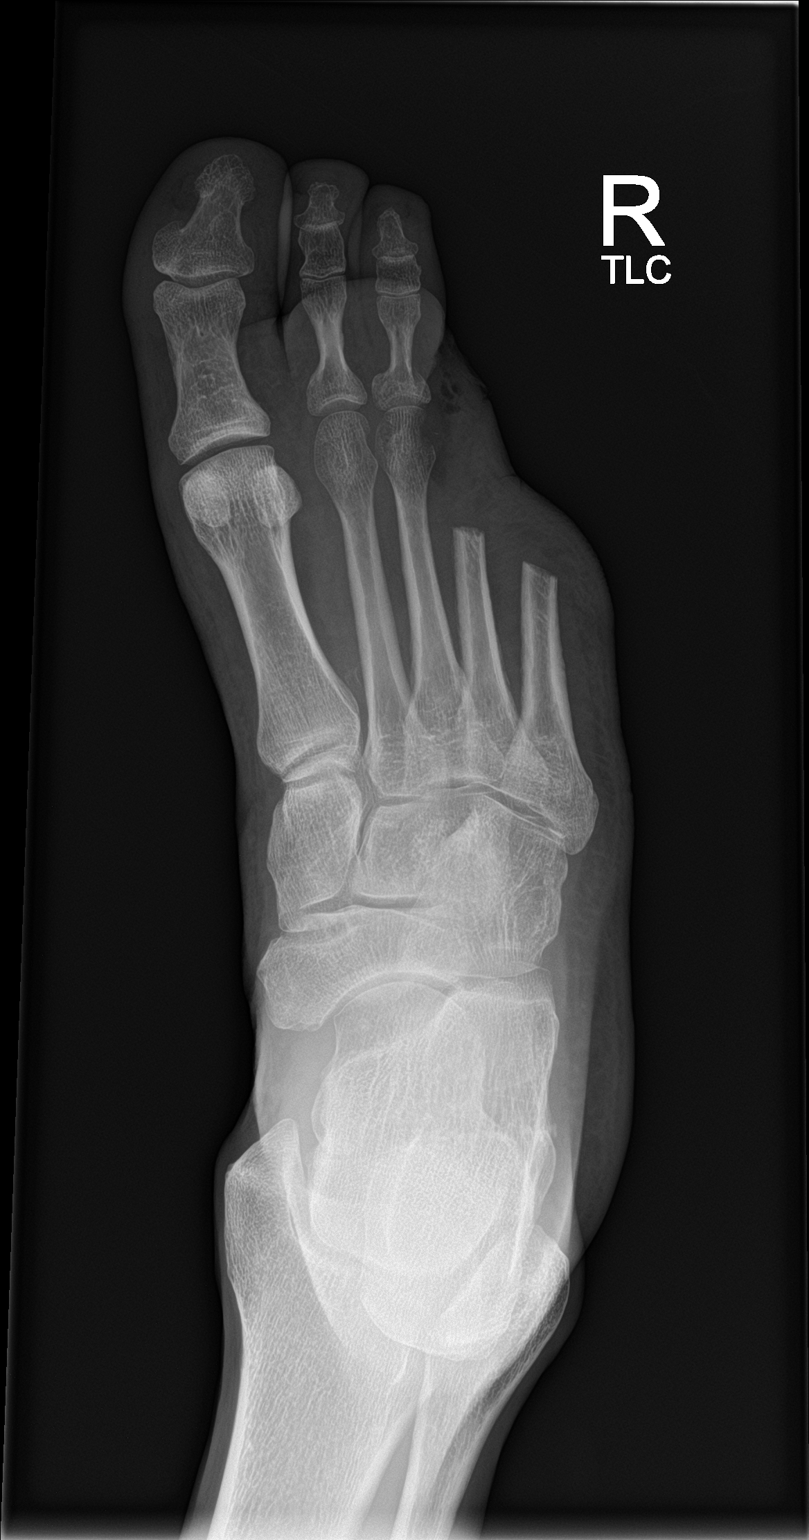

[foot lat]
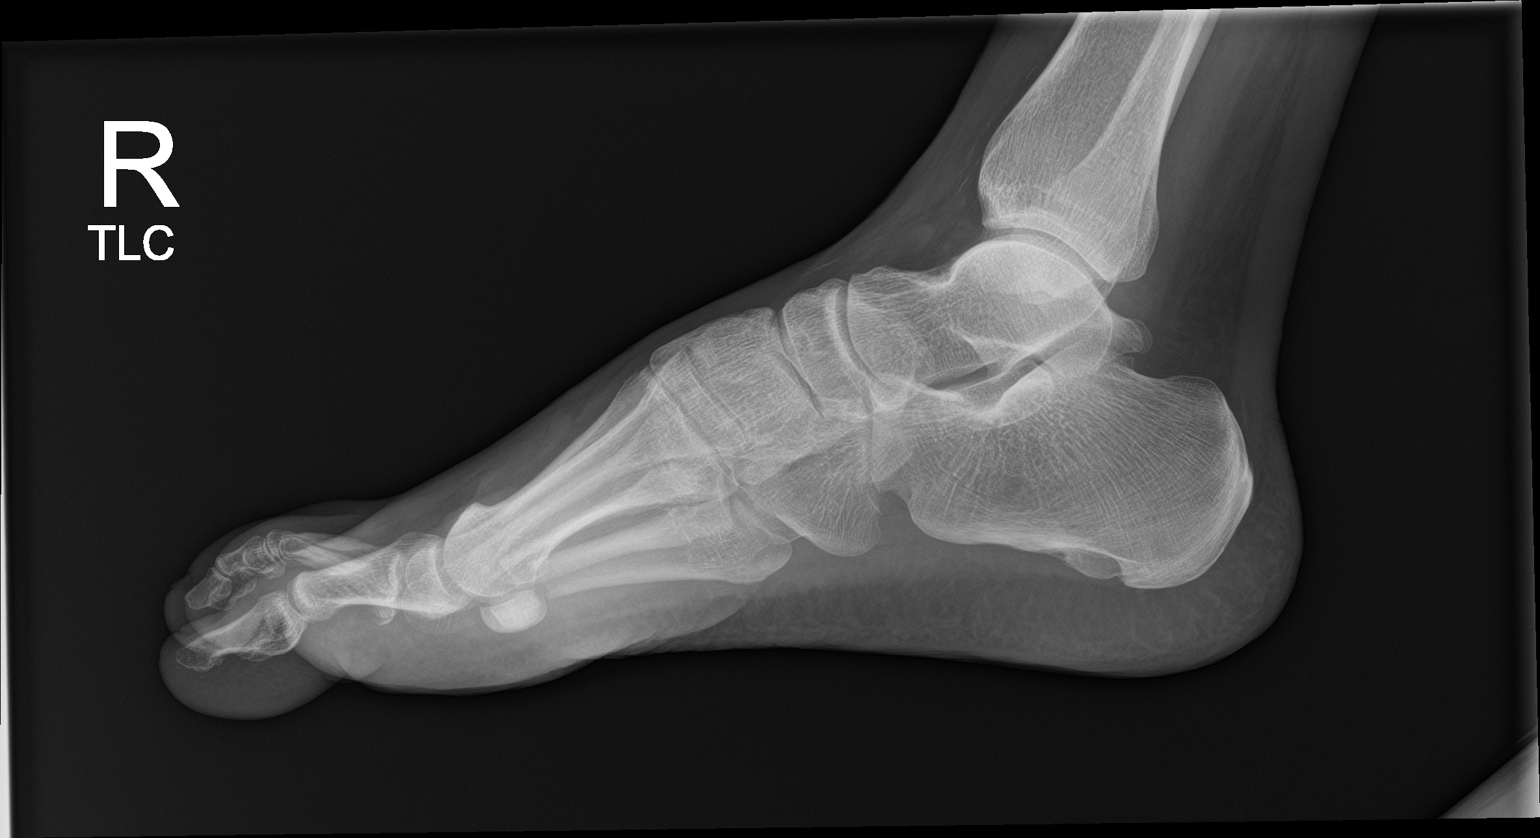

[2 of 2 positions shown; findings below may reference images not displayed]

FINDINGS: Postsurgical changes reflecting amputation of the distal portions of
the fourth and fifth metatarsals and the fourth and fifth toes are
again seen. There is no evidence of osseous irregularity or
destruction. There is lucency within the soft tissues adjacent to
the third toe MTP joint which appears similar to the prior study and
may reflect overlapping soft tissues. The soft tissues are otherwise
unremarkable.
IMPRESSION: 1. Status post amputation of the distal portions of the fourth and
fifth metatarsals and associated toes with no radiographic evidence
of acute osseous abnormality or destruction. MRI could be considered
for further evaluation of possible osteomyelitis as indicated.
2. Possible soft tissue gas along the lateral aspect of the third
toe MTP joint. This is similar to the appearance on the prior
radiograph and may reflect overlapping soft tissue; however,
correlate with physical exam to assess for wound/laceration in this
location. Infection with a gas-forming organism cannot be excluded
radiographically.

## 2021-04-02 MED ORDER — DULOXETINE HCL 60 MG PO CPEP: 60.0000 mg | ORAL_CAPSULE | Freq: Every morning | ORAL | Status: DC

## 2021-04-02 MED ORDER — ONDANSETRON HCL 4 MG/2ML IJ SOLN: 4.0000 mg | Freq: Four times a day (QID) | INTRAMUSCULAR | Status: DC | PRN

## 2021-04-02 MED ORDER — LACTATED RINGERS IV SOLN: INTRAVENOUS | Status: DC

## 2021-04-02 MED ORDER — ACETAMINOPHEN 325 MG PO TABS
650.0000 mg | ORAL_TABLET | Freq: Four times a day (QID) | ORAL | Status: DC | PRN
  Administered 2021-04-02: 650 mg via ORAL
  Filled 2021-04-02: qty 2

## 2021-04-02 MED ORDER — ACETAMINOPHEN 650 MG RE SUPP: 650.0000 mg | Freq: Four times a day (QID) | RECTAL | Status: DC | PRN

## 2021-04-02 MED ORDER — SODIUM CHLORIDE 0.9 % IV SOLN
2.0000 g | Freq: Three times a day (TID) | INTRAVENOUS | Status: DC
  Filled 2021-04-02: qty 2

## 2021-04-02 MED ORDER — DEXAMETHASONE SODIUM PHOSPHATE 10 MG/ML IJ SOLN
6.0000 mg | INTRAMUSCULAR | Status: DC
Start: 2021-04-02 — End: 2021-04-06
  Administered 2021-04-03 – 2021-04-05 (×3): 6 mg via INTRAVENOUS
  Filled 2021-04-02 (×3): qty 1

## 2021-04-02 MED ORDER — DEXAMETHASONE SODIUM PHOSPHATE 4 MG/ML IJ SOLN
4.0000 mg | Freq: Once | INTRAMUSCULAR | Status: AC
  Administered 2021-04-02: 4 mg via INTRAVENOUS
  Filled 2021-04-02: qty 1

## 2021-04-02 MED ORDER — LACTATED RINGERS IV BOLUS (SEPSIS)
1000.0000 mL | Freq: Once | INTRAVENOUS | Status: AC
  Administered 2021-04-02: 1000 mL via INTRAVENOUS

## 2021-04-02 MED ORDER — INSULIN ASPART 100 UNIT/ML IJ SOLN
0.0000 [IU] | Freq: Three times a day (TID) | INTRAMUSCULAR | Status: DC
  Administered 2021-04-02: 5 [IU] via SUBCUTANEOUS
  Administered 2021-04-03: 3 [IU] via SUBCUTANEOUS

## 2021-04-02 MED ORDER — CLOPIDOGREL BISULFATE 75 MG PO TABS: 75.0000 mg | ORAL_TABLET | Freq: Every day | ORAL | Status: DC

## 2021-04-02 MED ORDER — POLYETHYLENE GLYCOL 3350 17 G PO PACK: 17.0000 g | PACK | Freq: Every day | ORAL | Status: DC | PRN

## 2021-04-02 MED ORDER — ACETAMINOPHEN 650 MG RE SUPP
650.0000 mg | Freq: Once | RECTAL | Status: AC
  Administered 2021-04-02: 650 mg via RECTAL
  Filled 2021-04-02: qty 1

## 2021-04-02 MED ORDER — SODIUM CHLORIDE 0.9 % IV SOLN
2.0000 g | Freq: Once | INTRAVENOUS | Status: AC
  Administered 2021-04-02: 2 g via INTRAVENOUS
  Filled 2021-04-02: qty 2

## 2021-04-02 MED ORDER — VANCOMYCIN HCL 1250 MG/250ML IV SOLN
1250.0000 mg | Freq: Once | INTRAVENOUS | Status: AC
  Administered 2021-04-02: 1250 mg via INTRAVENOUS
  Filled 2021-04-02: qty 250

## 2021-04-02 MED ORDER — INSULIN GLARGINE-YFGN 100 UNIT/ML ~~LOC~~ SOLN
10.0000 [IU] | Freq: Every day | SUBCUTANEOUS | Status: DC
  Administered 2021-04-02 – 2021-04-03 (×2): 10 [IU] via SUBCUTANEOUS
  Filled 2021-04-02 (×3): qty 0.1

## 2021-04-02 MED ORDER — COLLAGENASE 250 UNIT/GM EX OINT
TOPICAL_OINTMENT | Freq: Every day | CUTANEOUS | Status: DC
  Administered 2021-04-06 – 2021-04-07 (×2): 1 via TOPICAL
  Filled 2021-04-02: qty 30

## 2021-04-02 MED ORDER — BUSPIRONE HCL 5 MG PO TABS
10.0000 mg | ORAL_TABLET | Freq: Two times a day (BID) | ORAL | Status: DC
  Administered 2021-04-02: 10 mg via ORAL
  Filled 2021-04-02: qty 1

## 2021-04-02 MED ORDER — ASPIRIN 81 MG PO CHEW: 81.0000 mg | CHEWABLE_TABLET | Freq: Every day | ORAL | Status: DC

## 2021-04-02 MED ORDER — SODIUM CHLORIDE 0.9 % IV SOLN
2.0000 g | Freq: Two times a day (BID) | INTRAVENOUS | Status: DC
  Administered 2021-04-02 – 2021-04-03 (×2): 2 g via INTRAVENOUS
  Filled 2021-04-02: qty 2

## 2021-04-02 MED ORDER — HEPARIN SODIUM (PORCINE) 5000 UNIT/ML IJ SOLN
5000.0000 [IU] | Freq: Three times a day (TID) | INTRAMUSCULAR | Status: DC
  Administered 2021-04-02: 5000 [IU] via SUBCUTANEOUS
  Filled 2021-04-02: qty 1

## 2021-04-02 MED ORDER — ONDANSETRON HCL 4 MG PO TABS: 4.0000 mg | ORAL_TABLET | Freq: Four times a day (QID) | ORAL | Status: DC | PRN

## 2021-04-02 MED ORDER — ROSUVASTATIN CALCIUM 20 MG PO TABS: 20.0000 mg | ORAL_TABLET | Freq: Every day | ORAL | Status: DC

## 2021-04-02 MED ORDER — GABAPENTIN 300 MG PO CAPS
300.0000 mg | ORAL_CAPSULE | Freq: Three times a day (TID) | ORAL | Status: DC
  Administered 2021-04-02: 300 mg via ORAL
  Filled 2021-04-02: qty 1

## 2021-04-02 MED ORDER — VANCOMYCIN HCL IN DEXTROSE 1-5 GM/200ML-% IV SOLN
1000.0000 mg | INTRAVENOUS | Status: DC
  Filled 2021-04-02: qty 200

## 2021-04-02 NOTE — Sepsis Progress Note (Signed)
ELink monitoring sepsis protocol 

## 2021-04-02 NOTE — ED Provider Notes (Addendum)
[facility] EMERGENCY DEPARTMENT Provider Note   CSN: [id number] Arrival date & time: 04/20/2021  1103     History Chief Complaint  Patient presents with   Hypotension    [name] is a 55 y.o. male.  This is a 55 y.o. male with significant medical history as below, including CAD, DM, HLD, HTN who presents to the ED with complaint of lethargy, hypotension, fever, cough, dyspnea, arthralgias  Duration: 2 to 3 weeks, worsened over the past couple days Onset:  sudden Timing:  constant Severity:  moderate Exacerbating/Alleviating Factors:  unable to describe Associated Symptoms:  multiple systemic complaints, patient is a poor and unreliable historian.  Pertinent Negatives:  no IVDU, no ETOH abuse, no recent medication changes, no falls.   Level 5 caveat, AMS  Per EMS patient lives with family who are coming to the ED, will obtain further history from family upon their arrival. Per EMS temp PTA 101, hypotensive on their initial assessment, received around of NS en route and BP improved to >100 systolic. Patient typically Aox3, was Aox1-2 on EMS arrival. Pt with wound to right toe, follows with wound clinic, on doxycycline.    The history is provided by the patient and the EMS personnel. The history is limited by the condition of the patient. No language interpreter was used.      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   Diabetes mellitus, type II, insulin dependent (HCC) 12/25/2020   Hyperlipidemia associated with type 2 diabetes mellitus (HCC) 12/25/2020    Essential hypertension 12/25/2020   Tobacco use 12/25/2020   Unstable angina (HCC) 12/24/2020   Coronary artery disease involving native coronary artery of native heart with unstable angina pectoris [facility])     Past Surgical History:  Procedure Laterality Date   ABDOMINAL AORTOGRAM W/LOWER EXTREMITY Right 01/29/2021   Procedure: ABDOMINAL AORTOGRAM W/LOWER EXTREMITY;  Surgeon: Nada Libman, MD;  Location: [facility] INVASIVE CV LAB;  Service: Cardiovascular;  Laterality: Right;   AMPUTATION Right 01/30/2021   Procedure: AMPUTATION RAY;  Surgeon: Vivi Barrack, DPM;  Location: [facility] OR;  Service: Podiatry;  Laterality: Right;   CARDIAC CATHETERIZATION  12/24/2020   CORONARY ARTERY BYPASS GRAFT N/A 12/27/2020   Procedure: CORONARY ARTERY BYPASS GRAFTING (CABG) X FOUR ON PUMP USING LEFT INTERNAL MAMMARY ARTERY AND RIGHT ENDOSCOPIC GREATER SAPHENOUS VEIN CONDUITS;  Surgeon: Corliss Skains, MD;  Location: [facility] OR;  Service: Open Heart Surgery;  Laterality: N/A;   LEFT HEART CATH AND CORONARY ANGIOGRAPHY N/A 12/24/2020   Procedure: LEFT HEART CATH AND CORONARY ANGIOGRAPHY;  Surgeon: Kathleene Hazel, MD;  Location: [facility] INVASIVE CV LAB;  Service: Cardiovascular;  Laterality: N/A;   PERIPHERAL VASCULAR BALLOON ANGIOPLASTY Right 01/29/2021   Procedure: PERIPHERAL VASCULAR BALLOON ANGIOPLASTY;  Surgeon: Nada Libman, MD;  Location: [facility] INVASIVE CV LAB;  Service: Cardiovascular;  Laterality: Right;  SFA and PT   TEE WITHOUT CARDIOVERSION N/A 12/27/2020   Procedure: TRANSESOPHAGEAL ECHOCARDIOGRAM (TEE);  Surgeon: Corliss Skains, MD;  Location:  [facility] OR;  Service: Open Heart Surgery;  Laterality: N/A;       No family history on file.  Social History   Tobacco Use   Smoking status: Former    Packs/day: 1.00    Years: 40.00    Pack years: 40.00    Types: Cigarettes    Quit date: 12/24/2020    Years since quitting: 0.2   Smokeless tobacco: Never  Vaping Use   Vaping Use: Never used   Substance Use Topics   Alcohol use: Not Currently   Drug use: Never    Home Medications Prior to Admission medications   Medication Sig Start Date End Date Taking? Authorizing Provider  Accu-Chek Softclix Lancets lancets SMARTSIG:Topical 12/10/20   [provider]  acetaminophen (TYLENOL) 500 MG tablet Take 1,000 mg by mouth every 6 (six) hours as needed for moderate pain or headache.    [provider]  aspirin 81 MG chewable tablet Chew 1 tablet (81 mg total) by mouth daily. 09/20/20   Derwood Kaplan, MD  busPIRone (BUSPAR) 10 MG tablet Take 10 mg by mouth 2 (two) times daily. 07/06/20   [provider]  carvedilol (COREG) 3.125 MG tablet Take 3.125 mg by mouth 2 (two) times daily. 02/16/21   [provider]  clopidogrel (PLAVIX) 75 MG tablet Take 1 tablet (75 mg total) by mouth daily with breakfast. 03/19/21 03/14/22  Alver Sorrow, NP  collagenase (SANTYL) ointment Apply 1 application topically daily. 03/14/21   Vivi Barrack, DPM  CYMBALTA 60 MG capsule Take 60 mg by mouth in the morning. 07/06/20   [provider]  diphenhydrAMINE (BENADRYL) 25 MG tablet Take 25 mg by mouth in the morning.    [provider]  doxycycline (VIBRA-TABS) 100 MG tablet Take 1 tablet (100 mg total) by mouth 2 (two) times daily. 03/28/21   Vivi Barrack, DPM  ferrous sulfate 325 (65 FE) MG tablet Take 325 mg by mouth daily with breakfast.    [provider]  gabapentin (NEURONTIN) 300 MG capsule Take 300 mg by mouth 3 (three) times daily.    [provider]  glipiZIDE (GLUCOTROL) 10 MG tablet Take 10 mg by mouth 2 (two) times daily. 07/06/20   [provider]  HYDROcodone-acetaminophen (NORCO/VICODIN) 5-325 MG tablet Take 1 tablet by mouth every 6 (six) hours as needed for moderate pain. 03/21/21   Vivi Barrack, DPM  Insulin Glargine (BASAGLAR KWIKPEN) 100 UNIT/ML Inject 10 Units into the skin in the morning. 07/06/20    [provider]  metFORMIN (GLUCOPHAGE) 1000 MG tablet Take 500 mg by mouth 2 (two) times daily. 07/06/20   [provider]  metoprolol tartrate (LOPRESSOR) 25 MG tablet Take 0.5 tablets (12.5 mg total) by mouth 2 (two) times daily. 01/17/21   Alver Sorrow, NP  midodrine (PROAMATINE) 10 MG tablet Take 1 tablet (10 mg total) by mouth 3 (three) times daily with meals. 01/17/21   Alver Sorrow, NP  omeprazole (PRILOSEC) 40 MG capsule Take 40 mg by mouth in the morning. 10/10/20   [provider]  rosuvastatin (CRESTOR) 20 MG tablet Take 1 tablet (20 mg total) by mouth daily. 12/10/20   Meriam Sprague, MD  topiramate (TOPAMAX) 50 MG tablet Take 1 tablet (50 mg total) by mouth 2 (two) times daily. 12/19/20   Penumalli, Glenford Bayley, MD  Vitamin D, Ergocalciferol, (DRISDOL) 1.25 MG (50000 UNIT) CAPS capsule Take 50,000 Units by mouth every Friday. 12/11/20  [provider]    Allergies    Atorvastatin  Review of Systems   Review of Systems  Unable to perform ROS: Acuity of condition   Physical Exam Updated Vital Signs BP 118/76   Pulse (!) 133   Temp (!) 102.8 F (39.3 C) (Rectal)   Resp 15   Ht 5\' 7"  (1.702 m)   Wt 58.1 kg   SpO2 91%   BMI 20.06 kg/m   Physical Exam Vitals and nursing note reviewed.  Constitutional:      Appearance: He is well-developed. He is ill-appearing.  HENT:     Head: Normocephalic and atraumatic.     Right Ear: External ear normal.     Left Ear: External ear normal.     Mouth/Throat:     Mouth: Mucous membranes are moist.  Eyes:     General: No scleral icterus. Cardiovascular:     Rate and Rhythm: Normal rate and regular rhythm.     Pulses: Normal pulses.     Heart sounds: Normal heart sounds.  Pulmonary:     Effort: Pulmonary effort is normal. Tachypnea present. No respiratory distress.     Breath sounds: Decreased breath sounds present.     Comments: Adventitious breath sounds b/l Abdominal:     General:  Abdomen is flat.     Palpations: Abdomen is soft.     Tenderness: There is no abdominal tenderness.     Comments: Not peritoneal abdomen  Musculoskeletal:        General: Normal range of motion.     Cervical back: Normal range of motion.     Right lower leg: No edema.     Left lower leg: No edema.       Feet:  Skin:    General: Skin is warm and dry.     Capillary Refill: Capillary refill takes less than 2 seconds.     Coloration: Skin is pale.  Neurological:     Mental Status: He is alert.     GCS: GCS eye subscore is 4. GCS verbal subscore is 4. GCS motor subscore is 6.     Cranial Nerves: Cranial nerves are intact. No dysarthria.     Sensory: Sensation is intact.     Motor: Motor function is intact.     Comments: Aox2 to self and time. Poor compliance with neuro exam.  Psychiatric:        Mood and Affect: Mood normal.        Behavior: Behavior normal.         ED Results / Procedures / Treatments   Labs (all labs ordered are listed, but only abnormal results are displayed) Labs Reviewed  RESP PANEL BY RT-PCR (FLU A&B, COVID) ARPGX2 - Abnormal; Notable for the following components:      Result Value   SARS Coronavirus 2 by RT PCR POSITIVE (*)    All other components within normal limits  LACTIC ACID, PLASMA - Abnormal; Notable for the following components:   Lactic Acid, Venous 5.3 (*)    All other components within normal limits  COMPREHENSIVE METABOLIC PANEL - Abnormal; Notable for the following components:   CO2 18 (*)    Glucose, Bld 307 (*)    BUN 22 (*)    Calcium 8.5 (*)    Albumin 2.8 (*)    Alkaline Phosphatase 146 (*)    Anion gap 17 (*)    All other components within normal limits  CBC WITH DIFFERENTIAL/PLATELET - Abnormal;  Notable for the following components:   Hemoglobin 11.8 (*)    HCT 36.9 (*)    Lymphs Abs 0.4 (*)    All other components within normal limits  APTT - Abnormal; Notable for the following components:   aPTT 23 (*)    All other  components within normal limits  I-STAT VENOUS BLOOD GAS, ED - Abnormal; Notable for the following components:   pCO2, Ven 38.8 (*)    pO2, Ven 47.0 (*)    Bicarbonate 18.3 (*)    TCO2 19 (*)    Acid-base deficit 8.0 (*)    Calcium, Ion 1.12 (*)    HCT 38.0 (*)    Hemoglobin 12.9 (*)    All other components within normal limits  CULTURE, BLOOD (ROUTINE X 2)  CULTURE, BLOOD (ROUTINE X 2)  PROTIME-INR  AMMONIA  LACTIC ACID, PLASMA  URINALYSIS, ROUTINE W REFLEX MICROSCOPIC  MISCELLANEOUS GENETIC TEST  PROCALCITONIN  TROPONIN I (HIGH SENSITIVITY)  TROPONIN I (HIGH SENSITIVITY)    EKG EKG Interpretation  Date/Time:  Tuesday April 02 2021 11:20:28 EDT Ventricular Rate:  141 PR Interval:  146 QRS Duration: 77 QT Interval:  280 QTC Calculation: 429 R Axis:   49 Text Interpretation: Sinus tachycardia Probable left atrial enlargement Borderline repolarization abnormality Confirmed by Tanda Rockers ([id number]) on 04/03/2021 12:11:19 PM  Radiology DG Chest 1 View  Result Date: 04/01/2021 CLINICAL DATA:  Hypotension I95.9 (ICD-10-CM) Questionable sepsis. EXAM: CHEST  1 VIEW COMPARISON:  02/15/2021. FINDINGS: Patchy airspace opacities throughout the left lung and in the right mid to upper lung, compatible with multifocal pneumonia. No visible pneumothorax or pleural effusion. Cardiomediastinal silhouette is within normal limits. CABG and median sternotomy. No acute osseous abnormality. IMPRESSION: Patchy airspace opacities throughout the left lung and in the right mid to upper lung, compatible with multifocal pneumonia. Electronically Signed   By: Feliberto Harts M.D.   On: 04/01/2021 12:23   CT HEAD WO CONTRAST ( )  Result Date: 03/17/2021 CLINICAL DATA:  Delirium EXAM: CT HEAD WITHOUT CONTRAST TECHNIQUE: Contiguous axial images were obtained from the base of the skull through the vertex without intravenous contrast. COMPARISON:  Brain MRI 09/20/2020, CT head 01/30/2021 FINDINGS: Brain:  There is no acute intracranial hemorrhage, extra-axial fluid collection, or acute infarct. Parenchymal volume is normal.  The ventricles are stable in size. There is no mass lesion.  There is no midline shift Vascular: No hyperdense vessel or unexpected calcification. Skull: Normal. Negative for fracture or focal lesion. Sinuses/Orbits: Imaged paranasal sinuses are clear. The globes and orbits are unremarkable. Other: None. IMPRESSION: No acute intracranial pathology. Electronically Signed   By: Lesia Hausen M.D.   On: 03/21/2021 12:30   DG Foot 2 Views Right  Result Date: 03/18/2021 CLINICAL DATA:  Status post amputation of fourth and fifth toes, possible sepsis EXAM: RIGHT FOOT - 2 VIEW COMPARISON:  Foot radiographs 03/07/2021 FINDINGS: Postsurgical changes reflecting amputation of the distal portions of the fourth and fifth metatarsals and the fourth and fifth toes are again seen. There is no evidence of osseous irregularity or destruction. There is lucency within the soft tissues adjacent to the third toe MTP joint which appears similar to the prior study and may reflect overlapping soft tissues. The soft tissues are otherwise unremarkable. IMPRESSION: 1. Status post amputation of the distal portions of the fourth and fifth metatarsals and associated toes with no radiographic evidence of acute osseous abnormality or destruction. MRI could be considered for further evaluation of possible osteomyelitis as  indicated. 2. Possible soft tissue gas along the lateral aspect of the third toe MTP joint. This is similar to the appearance on the prior radiograph and may reflect overlapping soft tissue; however, correlate with physical exam to assess for wound/laceration in this location. Infection with a gas-forming organism cannot be excluded radiographically. Electronically Signed   By: Lesia Hausen M.D.   On: 03/23/2021 12:26    Procedures .Critical Care Performed by: Sloan Leiter, DO Authorized by: Sloan Leiter, DO   Critical care provider statement:    Critical care time (minutes):  44   Critical care time was exclusive of:  Separately billable procedures and treating other patients   Critical care was necessary to treat or prevent imminent or life-threatening deterioration of the following conditions:  Sepsis, shock and respiratory failure   Critical care was time spent personally by me on the following activities:  Development of treatment plan with patient or surrogate, evaluation of patient's response to treatment, examination of patient, obtaining history from patient or surrogate, review of old charts, re-evaluation of patient's condition, pulse oximetry, ordering and review of radiographic studies, ordering and review of laboratory studies and ordering and performing treatments and interventions   Care discussed with: admitting provider     Medications Ordered in ED Medications  vancomycin (VANCOREADY) IVPB 1250 mg/250 mL (has no administration in time range)  lactated ringers bolus 1,000 mL (has no administration in time range)    And  lactated ringers bolus 1,000 mL (has no administration in time range)  dexamethasone (DECADRON) injection 4 mg (has no administration in time range)  vancomycin (VANCOCIN) IVPB 1000 mg/200 mL premix (has no administration in time range)  ceFEPIme (MAXIPIME) 2 g in sodium chloride 0.9 % 100 mL IVPB (has no administration in time range)  lactated ringers bolus 1,000 mL (0 mLs Intravenous Stopped 04/01/2021 1310)  acetaminophen (TYLENOL) suppository 650 mg (650 mg Rectal Given 04/06/2021 1229)  ceFEPIme (MAXIPIME) 2 g in sodium chloride 0.9 % 100 mL IVPB (0 g Intravenous Stopped 04/11/2021 1310)    ED Course  I have reviewed the triage vital signs and the nursing notes.  Pertinent labs & imaging results that were available during my care of the patient were reviewed by me and considered in my medical decision making (see chart for details).    MDM  Rules/Calculators/A&P                           CC: fever, hypotension  This patient complains of fever, hypotension; this involves an extensive number of treatment options and is a complaint that carries with it a high risk of complications and morbidity. Vital signs were reviewed. Serious etiologies considered.  Record review:   Previous records obtained and reviewed   Work up as above, notable for:   Labs & imaging results that were available during my care of the patient were reviewed by me and considered in my medical decision making.   Patient positive COVID-19.  Isolation precautions in place.   Lactic acid acute elevated, patient tachycardic, febrile, hypoxic, hypotensive; meets criteria for septic shock, likely secondary to pulmonary versus soft tissue infection.  Blood cultures obtained.  Patient started on broad-spectrum antibiotics.  Prior wound culture positive for MRSA.  I ordered imaging studies which included CTH, CXR, Foot Xr and I independently visualized and interpreted imaging which showed multifocal pneumonia, likely soft tissue infection of right foot.  Reassessment:  Respiratory  status improving on supplemental oxygen.  Give patient steroids given COVID-19 diagnosis and hypoxia.  Hemodynamics have improved.  Continue sepsis bolus given profound tachycardia.  Rectal Tylenol given.  At this time recommend admission, discussed with hospital team who accepts patient.        This chart was dictated using voice recognition software.  Despite best efforts to proofread,  errors can occur which can change the documentation meaning.  Final Clinical Impression(s) / ED Diagnoses Final diagnoses:  Hypotension  Multifocal pneumonia  Cellulitis of foot  Acute respiratory failure with hypoxia (HCC)  Delirium  COVID-19  Septic shock [facility])    Rx / DC Orders ED Discharge Orders     None        Sloan Leiter, DO 04/13/2021 1318    Tanda Rockers A,  DO 04/01/2021 1431

## 2021-04-02 NOTE — Progress Notes (Signed)
Pharmacy Antibiotic Note  [name] is a 55 y.o. male admitted on Apr 17, 2021 with sepsis.  Pharmacy has been consulted for vancomycin and cefepime dosing.  Plan: Vancomycin 1250mg  x1 then 1000mg  IV q24h (eAUC 471, Cr 1.23mg /dL, Vd ) Cefepime 2g IV q8h -Monitor renal function, clinical status, and antibiotic plan -Order vanc levels as necessary -Add MRSA PCR  Height: 5\' 7"  (170.2 cm) Weight: 58.1 kg (128 lb 1.4 oz) IBW/kg (Calculated) : 66.1  Temp (24hrs), Avg:102.8 F (39.3 C), Min:102.8 F (39.3 C), Max:102.8 F (39.3 C)  Recent Labs  Lab 04-17-2021 1120  WBC 5.5    Estimated Creatinine Clearance: 80.7 mL/min (by C-G formula based on SCr of 0.85 mg/dL).    Allergies  Allergen Reactions   Atorvastatin Other (See Comments)    Myalgias     Antimicrobials this admission: Vanc 10/18 >>  Cefepime 10/18 >>   Dose adjustments this admission: N/A  Microbiology results: 10/18 BCx:   Thank you for allowing pharmacy to be a part of this patient's care.  11/18, PharmD, [facility] Emergency Medicine Clinical Pharmacist ED RPh Phone: [phone] Main RX: [phone]

## 2021-04-02 NOTE — Progress Notes (Signed)
PHARMACY NOTE:  ANTIMICROBIAL RENAL DOSAGE ADJUSTMENT  Current antimicrobial regimen includes a mismatch between antimicrobial dosage and estimated renal function.  As per policy approved by the Pharmacy & Therapeutics and Medical Executive Committees, the antimicrobial dosage will be adjusted accordingly.  Current antimicrobial dosage:  Cefepime 2 gm IV Q 8 hrs  Indication: Sepsis  Renal Function:  Estimated Creatinine Clearance: 55.8 mL/min (by C-G formula based on SCr of 1.23 mg/dL). []      On intermittent HD, scheduled: []      On CRRT    Antimicrobial dosage has been changed to:  Cefepime 2 gm IV Q 12 hrs   Thank you for allowing pharmacy to be a part of this patient's care.  , PharmD, BCPS, [facility] Clinical Pharmacist 04/14/2021 8:10 PM

## 2021-04-02 NOTE — ED Notes (Signed)
Spouse updated.

## 2021-04-02 NOTE — H&P (Signed)
Date: 03/24/2021               Patient Name:  [name] MRN: [id number]  DOB: 06/18/1965 Age / Sex: 55 y.o., male   PCP: Earna Coder, NP         Medical Service: Internal Medicine Teaching Service         Attending Physician: Dr. Aldine Contes, MD    First Contact: Lajean Manes, MD Pager: MP [phone]  Second Contact: Iona Beard, MD Pager: Governor Rooks [phone]       After Hours (After 5p/  First Contact Pager: [phone]  weekends / holidays): Second Contact Pager: [phone]   SUBJECTIVE  Chief Complaint: Hypotension, cough, chills, and fever  History of Present Illness: [name] is a 55 y.o. male with a pertinent PMH of HLD, HTN, osteomyelitis, CAD SP CABG x4, who presents to [facility] with hypertension, cough, chills, and fever.  Patient states that his symptoms began several days ago, starting with full body chills. He then began to notice having fevers that would not reduce with Tylenol.  He also endorses a nonproductive cough, feelings of nausea without emesis, myalgias, and he additionally endorses that he has been feeling like he feels like he has been thinking "slowly" the past several days.  He additionally notes that he feels like there is more pain in his right foot than usual.  He denies symptoms of headaches, vision changes, chest pain, abdominal pain, dysuria, and the patient or diarrhea  In the ED, the patient was to be hypotensive and tachycardic with heart rates in the 140s, have a fever of 102.8, his CXR shows multifocal pneumonia in the left lung and right middle and upper lobes, his lactic acid was 5.3.  He was started on IV Decadron and broad-spectrum antibiotics for presumed sepsis.  Medications: No current facility-administered medications on file prior to encounter.   Current Outpatient Medications on File Prior to Encounter  Medication Sig Dispense Refill   acetaminophen (TYLENOL) 500 MG tablet Take 1,000 mg by mouth every 6 (six) hours as needed for moderate  pain, mild pain or fever.     aspirin 81 MG chewable tablet Chew 1 tablet (81 mg total) by mouth daily. 30 tablet 1   busPIRone (BUSPAR) 10 MG tablet Take 10 mg by mouth 2 (two) times daily.     carvedilol (COREG) 3.125 MG tablet Take 3.125 mg by mouth 2 (two) times daily.     clopidogrel (PLAVIX) 75 MG tablet Take 1 tablet (75 mg total) by mouth daily with breakfast. 30 tablet 11   collagenase (SANTYL) ointment Apply 1 application topically daily. (Patient taking differently: Apply 1 application topically See admin instructions. Use with wound care regimen as directed (right foot)) 90 g 0   CYMBALTA 60 MG capsule Take 60 mg by mouth in the morning.     doxycycline (VIBRA-TABS) 100 MG tablet Take 1 tablet (100 mg total) by mouth 2 (two) times daily. 20 tablet 0   gabapentin (NEURONTIN) 300 MG capsule Take 300 mg by mouth 3 (three) times daily with meals.     glipiZIDE (GLUCOTROL) 10 MG tablet Take 10 mg by mouth 2 (two) times daily.     HYDROcodone-acetaminophen (NORCO/VICODIN) 5-325 MG tablet Take 1 tablet by mouth every 6 (six) hours as needed for moderate pain. 10 tablet 0   Insulin Glargine (BASAGLAR KWIKPEN) 100 UNIT/ML Inject 15 Units into the skin daily as needed (for elevated BGL).     metFORMIN (GLUCOPHAGE) 1000  MG tablet Take 500 mg by mouth 2 (two) times daily.     metoprolol tartrate (LOPRESSOR) 25 MG tablet Take 0.5 tablets (12.5 mg total) by mouth 2 (two) times daily. 30 tablet 5   midodrine (PROAMATINE) 10 MG tablet Take 1 tablet (10 mg total) by mouth 3 (three) times daily with meals. 90 tablet 5   omeprazole (PRILOSEC) 40 MG capsule Take 40 mg by mouth daily before breakfast.     rosuvastatin (CRESTOR) 20 MG tablet Take 1 tablet (20 mg total) by mouth daily. 90 tablet 1   topiramate (TOPAMAX) 50 MG tablet Take 1 tablet (50 mg total) by mouth 2 (two) times daily. 60 tablet 12   Vitamin D, Ergocalciferol, (DRISDOL) 1.25 MG (50000 UNIT) CAPS capsule Take 50,000 Units by mouth every  Friday.     Accu-Chek Softclix Lancets lancets SMARTSIG:Topical     diphenhydrAMINE (BENADRYL) 25 MG tablet Take 25 mg by mouth in the morning. (Patient not taking: Reported on 04/13/2021)     ferrous sulfate 325 (65 FE) MG tablet Take 325 mg by mouth daily with breakfast. (Patient not taking: Reported on 04/14/2021)      Past Medical History:  Past Medical History:  Diagnosis Date   Anginal pain ([place])    Ataxia    CAD (coronary artery disease)    Diabetes mellitus without complication ([place])    Dizziness    Hyperlipidemia    Hypertension    Near syncope     Social:   Lives - At home with his wife Occupation - Retired Research officer, trade union - Wife and two sons (9 (works as a Building control surveyor) and 2 Recruitment consultant)) PCP - Heber Ramona NP Tobacco: Former smoker, quit "years" ago Substance use - Denies ETOH, or Drug usage (including IV)  Family History: HTN: Cannot recall DM: Father Stroke: Father Cancer: Cannot recall  Allergies: Allergies as of 03/22/2021 - Review Complete 03/21/2021  Allergen Reaction Noted   Atorvastatin Other (See Comments) 12/25/2020   Ropinirole Other (See Comments) 03/25/2021    Review of Systems: A complete ROS was negative except as per HPI.   OBJECTIVE:  Physical Exam: Blood pressure 102/68, pulse (!) 125, temperature 99.1 F (37.3 C), temperature source Oral, resp. rate (!) 33, height _0  (1.702 m), weight 58.1 kg, SpO2 91 %. Physical Exam Constitutional:      Comments: Ill in appearance, mild acute distress  Cardiovascular:     Rate and Rhythm: Regular rhythm. Tachycardia present.     Pulses: Normal pulses.     Heart sounds: Normal heart sounds. No murmur heard.   No friction rub. No gallop.  Pulmonary:     Breath sounds: No wheezing, rhonchi or rales.     Comments: Saturating 88-90% on 3 L of O2, tachypneic with RR of 26, decreased breath sounds appreciated bilaterally. Abdominal:     General: Bowel sounds are normal.     Tenderness: There is no  abdominal tenderness. There is no guarding.  Musculoskeletal:     Right lower leg: No edema.     Left lower leg: No edema.     Comments: Right foot with first and second digit amputated, unremarkable tissue present at the site of amputation, no purulence, but serous drainage noted.         Pertinent Labs: CBC    Component Value Date/Time   WBC 5.5 03/28/2021 1120   RBC 4.45 04/13/2021 1120   HGB 12.9 (L) 04/15/2021 1136   HGB 12.5 (L) 03/21/2021 1514  HCT 38.0 (L) 03/17/2021 1136   HCT 40.3 03/21/2021 1514   PLT 221 04/11/2021 1120   PLT 275 03/21/2021 1514   MCV 82.9 03/28/2021 1120   MCV 85 03/21/2021 1514   MCH 26.5 04/11/2021 1120   MCHC 32.0 03/16/2021 1120   RDW 15.4 04/14/2021 1120   RDW 15.1 03/21/2021 1514   LYMPHSABS 0.4 (L) 03/19/2021 1120   LYMPHSABS 1.5 03/21/2021 1514   MONOABS 0.2 03/27/2021 1120   EOSABS 0.0 04/01/2021 1120   EOSABS 0.4 03/21/2021 1514   BASOSABS 0.0 03/18/2021 1120   BASOSABS 0.1 03/21/2021 1514     CMP     Component Value Date/Time   NA 138 04/14/2021 1136   NA 137 03/21/2021 1514   K 3.6 03/18/2021 1136   CL 102 04/14/2021 1120   CO2 18 (L) 03/24/2021 1120   GLUCOSE 307 (H) 03/18/2021 1120   BUN 22 (H) 03/27/2021 1120   BUN 21 03/21/2021 1514   CREATININE 1.23 03/25/2021 1120   CALCIUM 8.5 (L) 03/29/2021 1120   PROT 6.6 03/30/2021 1120   ALBUMIN 2.8 (L) 03/26/2021 1120   AST 25 04/03/2021 1120   ALT 20 04/09/2021 1120   ALKPHOS 146 (H) 04/04/2021 1120   BILITOT 0.8 03/31/2021 1120   GFRNONAA >60 04/05/2021 1120   GFRAA  09/08/2008 1152    >60        The eGFR has been calculated using the MDRD equation. This calculation has not been validated in all clinical situations. eGFR's persistently <60 mL/min signify possible Chronic Kidney Disease.    Pertinent Imaging: DG Chest 1 View  Result Date: 03/18/2021 CLINICAL DATA:  Hypotension I95.9 (ICD-10-CM) Questionable sepsis. EXAM: CHEST  1 VIEW COMPARISON:   02/15/2021. FINDINGS: Patchy airspace opacities throughout the left lung and in the right mid to upper lung, compatible with multifocal pneumonia. No visible pneumothorax or pleural effusion. Cardiomediastinal silhouette is within normal limits. CABG and median sternotomy. No acute osseous abnormality. IMPRESSION: Patchy airspace opacities throughout the left lung and in the right mid to upper lung, compatible with multifocal pneumonia. Electronically Signed   By: Margaretha Sheffield M.D.   On: 04/10/2021 12:23   CT HEAD WO CONTRAST (5MM)  Result Date: 03/31/2021 CLINICAL DATA:  Delirium EXAM: CT HEAD WITHOUT CONTRAST TECHNIQUE: Contiguous axial images were obtained from the base of the skull through the vertex without intravenous contrast. COMPARISON:  Brain MRI 09/20/2020, CT head 01/30/2021 FINDINGS: Brain: There is no acute intracranial hemorrhage, extra-axial fluid collection, or acute infarct. Parenchymal volume is normal.  The ventricles are stable in size. There is no mass lesion.  There is no midline shift Vascular: No hyperdense vessel or unexpected calcification. Skull: Normal. Negative for fracture or focal lesion. Sinuses/Orbits: Imaged paranasal sinuses are clear. The globes and orbits are unremarkable. Other: None. IMPRESSION: No acute intracranial pathology. Electronically Signed   By: Valetta Mole M.D.   On: 03/26/2021 12:30   DG Foot 2 Views Right  Result Date: 04/13/2021 CLINICAL DATA:  Status post amputation of fourth and fifth toes, possible sepsis EXAM: RIGHT FOOT - 2 VIEW COMPARISON:  Foot radiographs 03/07/2021 FINDINGS: Postsurgical changes reflecting amputation of the distal portions of the fourth and fifth metatarsals and the fourth and fifth toes are again seen. There is no evidence of osseous irregularity or destruction. There is lucency within the soft tissues adjacent to the third toe MTP joint which appears similar to the prior study and may reflect overlapping soft tissues.  The soft tissues  are otherwise unremarkable. IMPRESSION: 1. Status post amputation of the distal portions of the fourth and fifth metatarsals and associated toes with no radiographic evidence of acute osseous abnormality or destruction. MRI could be considered for further evaluation of possible osteomyelitis as indicated. 2. Possible soft tissue gas along the lateral aspect of the third toe MTP joint. This is similar to the appearance on the prior radiograph and may reflect overlapping soft tissue; however, correlate with physical exam to assess for wound/laceration in this location. Infection with a gas-forming organism cannot be excluded radiographically. Electronically Signed   By: Valetta Mole M.D.   On: 03/27/2021 12:26    EKG: personally reviewed my interpretation is sinus tachycardia  ASSESSMENT & PLAN:  Assessment: Active Problems:   Sepsis (HCC)   [name] is a 55 y.o. with pertinent PMH of diabetes, hypertension, hyperlipidemia, CAD, and osteomyelitis who presented with hypotension and admit for sepsis on hospital day 0  Plan: #Sepsis #Hypoxic Respiratory Failure likely 2/2 to COVID-19 Pneumonia: Patient with risk factors of uncontrolled diabetes, hyperlipidemia, and CAD presents today with hypotension, with pressures of 78/48 when EMS arrived, which was responsive to 700 mL fluid bolus. Currently tachycardic initially in the ED in the 140s, slightly down trended to 120s upon examination.  He was additionally saturating at 88% room air, and was placed on 3 L nasal cannula which is since decreased to 5 L for saturation of 92% during the interview.  He does not have a leukocytosis, but he does have a fever that spiked in 102.8 F, is tachycardic, has a lactic acidosis of 5.2 and likely has 2 potential sources, his COVID 19, with CXR concerning for focal pneumonia in his right lower extremity wound. He was treated with sepsis protocol and status post 3 L of normal saline with improvement  in his blood pressures, currently being treated with vancomycin and cefepime, his prior hospitalization did test positive for MRSA. - Continue broad-spectrum antibiotics with vancomycin and cefepime - Continue 6 mg IV Decadron daily - Follow-up Pro-Cal - Follow-up blood cultures - Follow-up MRSA PCR nasal swab - Trend CRP, D-dimer, and ferritin - Trend CBC and CMP - Continue to wean oxygen as tolerated - ICP and flutter valve at bedside - Admit to progressive unit with telemetry  #Right Lower Extremity Wound 2/2 to Osteomyelitis and Subsequent Ray Amputation PAD: During patient's prior hospitalization in August 2022 he was found to have osteomyelitis with nonhealing wound of the toe of the right foot.  He was seen by vascular surgery status post arteriogram showed right lower extremity runoff with DCB angioplasty of the right popliteal artery and balloon angioplasty of the right posterior tibial artery per vascular.  Was subsequently seen by Dr. Earleen Newport and underwent a ray amputation with an operative culture growing MRSA and Morganella which was initially treated with Cipro and doxycycline and has been on doxycycline since. Patient has been undergoing debridement with wound care and was last seen 03/29/2021.  He has had discussions with Dr. Earleen Newport concerning further amputation with a TMA, but declined amputation at the time. Today states that he is having foot pain, no crepitus appreciated on palpation, nonviable tissue present, there is no purulence noted, but he does have some serous drainage.  Foot x-ray was concerning for possible tissue gas which appears similar to prior imaging, once again no crepitus noted on physical examination.  Given his sepsis picture, and that his CRP and ESR will be likely elevated given his COVID status we  will proceed with MRI of the affected foot to rule out osteomyelitis. Currently being treated with broad-spectrum drugs. - Continue broad-spectrum antibiotics - WOC  consult - Order MRI of the right foot with and without contrast - Trend CBC  #Diabetes Melitis #Diabetic Neuropathy: Last A1c of 9.9.  Current regimen metformin 500 mg twice daily, glipizide 10 mg twice daily, and Basaglar 15 units daily.  He is currently on gabapentin 300 mg 3 times daily for his neuropathy. May need to adjust in the setting of Decadron. - Start glargine 11 units - Moderate SSI - Gabapentin 300 mg 3 times daily  #Hypertension - Does not take any medications at home, will continue to monitor vitals.  #Hyperlipidemia - Continue Crestor 20 mg daily  #CAD with CABG - Continue aspirin 81 mg, Plavix 75 mg  - Holding home metoprolol in setting of sepsis  #Anxiety #Depression His Home regimen includes Cymbalta 60 mg, BuSpar 10 mg, Topamax 50 mg. - Cymbalta 60 mg daily - BuSpar 10 mg daily  Best Practice: Diet: N.p.o. IVF: Fluids: Normal saline, Rate: 1 L bolus VTE: heparin injection 5,000 Units Start: 03/19/2021 1430 Code: Full AB: Vanco and cefepime Status: Observation with expected length of stay less than 2 midnights. Anticipated Discharge Location:  Pending further medical work-up Barriers to Discharge: Pending further medical work-up  Signature: Maudie Mercury, MD Internal Medicine Resident, PGY-3 [facility]  2:21 PM, 03/31/2021   Please contact the on call pager after 5 pm and on weekends at [phone].

## 2021-04-02 NOTE — ED Triage Notes (Addendum)
Ems called out for temp of 101 at home for ems 99.5 pt was upright and confused but they were unable to obtain BP in home.  Got to the truck and 78/48, total of ns given last BP 118/70, 94%4L pt is not baseline confused currently oriented to self HR 145, RR 45 cap 26-28 recent amputaed toe not healing well suspicious for sepsis

## 2021-04-02 NOTE — ED Notes (Signed)
Md notified regarding RR, HR, and temp

## 2021-04-02 NOTE — Consult Note (Signed)
Reason for Consult:Infection  Referring Physician: Dr. Dolan Amen, MD  [name] is an 55 y.o. male.  HPI: 55 y.o. male with a pertinent PMH of HLD, HTN, osteomyelitis, CAD SP CABG x4, who presents to [facility] with hypertension, cough, chills, and fever.  Patient is unable to provide any history but upon speaking to his wife she states that last night he was not feeling well and she was getting get some cold medication.  This morning when she woke up he was half off the bed and unresponsive and that is when he was brought to the emergency department.  While in emergency department he is hypotensive and tachycardia with heart rates in the 140s.  A fever of 102.8.  Chest x-ray shows multifocal pneumonia in his left lung and right upper and middle lobes.  Lactic acid was 5.3.  He is started on IV Decadron and broad-spectrum antibiotics for sepsis.  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  He had revascularization performed by vascular surgery.  The wound went on to dehiscence and we have been doing local wound care.  I last saw him last week and we discussed return to the operating room for debridement versus further amputation.  He did not want proceed with surgery at that time and want to get another week or so before proceeding.  He has been using Santyl on the wound.  Past Medical History:  Diagnosis Date   Anginal pain (HCC)    Ataxia    CAD (coronary artery disease)    Diabetes mellitus without complication (HCC)    Dizziness    Hyperlipidemia    Hypertension    Near syncope     Past Surgical History:  Procedure Laterality Date   ABDOMINAL AORTOGRAM W/LOWER EXTREMITY Right 01/29/2021   Procedure: ABDOMINAL AORTOGRAM W/LOWER EXTREMITY;  Surgeon: Nada Libman, MD;  Location: [facility] INVAS[facility];  Service: Cardiovascular;  Laterality: Right;   AMPUTATION Right 01/30/2021   Procedure: AMPUTATION RAY;  Surgeon: [name], DPM;   Location: [facility] OR;  Service: Podiatry;  Laterality: Right;   CARDIAC CATHETERIZATION  12/24/2020   CORONARY ARTERY BYPASS GRAFT N/A 12/27/2020   Procedure: CORONARY ARTERY BYPASS GRAFTING (CABG) X FOUR ON PUMP USING LEFT INTERNAL MAMMARY ARTERY AND RIGHT ENDOSCOPIC GREATER SAPHENOUS VEIN CONDUITS;  Surgeon: Corliss Skains, MD;  Location: [facility] OR;  Service: Open Heart Surgery;  Laterality: N/A;   LEFT HEART CATH AND CORONARY ANGIOGRAPHY N/A 12/24/2020   Procedure: LEFT HEART CATH AND CORONARY ANGIOGRAPHY;  Surgeon: Kathleene Hazel, MD;  Location: [facility];  Service: Cardiovascular;  Laterality: N/A;   PERIPHERAL VASCULAR BALLOON ANGIOPLASTY Right 01/29/2021   Procedure: PERIPHERAL VASCULAR BALLOON ANGIOPLASTY;  Surgeon: Nada Libman, MD;  Location: [facility];  Service: Cardiovascular;  Laterality: Right;  SFA and PT   TEE WITHOUT CARDIOVERSION N/A 12/27/2020   Procedure: TRANSESOPHAGEAL ECHOCARDIOGRAM (TEE);  Surgeon: Corliss Skains, MD;  Location: [facility] OR;  Service: Open Heart Surgery;  Laterality: N/A;    No family history on file.  Social History:  reports that he quit smoking about 3 months ago. His smoking use included cigarettes. He has a 40.00 pack-year smoking history. He has never used smokeless tobacco. He reports that he does not currently use alcohol. He reports that he does not use drugs.  Allergies:  Allergies  Allergen Reactions   Atorvastatin Other (See Comments)    Myalgias  Ropinirole Other (See Comments)    Vivid dreams- MD stopped this med    Medications: I have reviewed the patient's current medications.  Results for orders placed or performed during the hospital encounter of 04/04/2021 (from the past 48 hour(s))  Resp Panel by RT-PCR (Flu A&B, Covid) Nasopharyngeal Swab     Status: Abnormal   Collection Time: 03/16/2021 11:20 AM   Specimen: Nasopharyngeal Swab; Nasopharyngeal(NP) swabs in vial transport medium  Result Value Ref Range    SARS Coronavirus 2 by RT PCR POSITIVE (A) NEGATIVE    Comment: RESULT CALLED TO, READ BACK BY AND VERIFIED WITH: RN J MULLI [id number] AT 1328 BY CM (NOTE) SARS-CoV-2 target nucleic acids are DETECTED.  The SARS-CoV-2 RNA is generally detectable in upper respiratory specimens during the acute phase of infection. Positive results are indicative of the presence of the identified virus, but do not rule out bacterial infection or co-infection with other pathogens not detected by the test. Clinical correlation with patient history and other diagnostic information is necessary to determine patient infection status. The expected result is Negative.  Fact Sheet for Patients: BloggerCourse.com  Fact Sheet for Healthcare Providers: SeriousBroker.it  This test is not yet approved or cleared by the [place] FDA and  has been authorized for detection and/or diagnosis of SARS-CoV-2 by FDA under an Emergency Use Authorization (EUA).  This EUA will remain in effect (meaning this test can be u sed) for the duration of  the COVID-19 declaration under Section 564(b)(1) of the Act, 21 U.S.C. section 360bbb-3(b)(1), unless the authorization is terminated or revoked sooner.     Influenza A by PCR NEGATIVE NEGATIVE   Influenza B by PCR NEGATIVE NEGATIVE    Comment: (NOTE) The Xpert Xpress SARS-CoV-2/FLU/RSV plus assay is intended as an aid in the diagnosis of influenza from Nasopharyngeal swab specimens and should not be used as a sole basis for treatment. Nasal washings and aspirates are unacceptable for Xpert Xpress SARS-CoV-2/FLU/RSV testing.  Fact Sheet for Patients: BloggerCourse.com  Fact Sheet for Healthcare Providers: SeriousBroker.it  This test is not yet approved or cleared by the [place] FDA and has been authorized for detection and/or diagnosis of SARS-CoV-2 by FDA under an  Emergency Use Authorization (EUA). This EUA will remain in effect (meaning this test can be used) for the duration of the COVID-19 declaration under Section 564(b)(1) of the Act, 21 U.S.C. section 360bbb-3(b)(1), unless the authorization is terminated or revoked.  Performed at [facility], [address] [place], [place] [address]   Comprehensive metabolic panel     Status: Abnormal   Collection Time: 03/25/2021 11:20 AM  Result Value Ref Range   Sodium 137 135 - 145 mmol/L   Potassium 3.6 3.5 - 5.1 mmol/L   Chloride 102 98 - 111 mmol/L   CO2 18 (L) 22 - 32 mmol/L   Glucose, Bld 307 (H) 70 - 99 mg/dL    Comment: Glucose reference range applies only to samples taken after fasting for at least 8 hours.   BUN 22 (H) 6 - 20 mg/dL   Creatinine, Ser 4.01 0.61 - 1.24 mg/dL   Calcium 8.5 (L) 8.9 - 10.3 mg/dL   Total Protein 6.6 6.5 - 8.1 g/dL   Albumin 2.8 (L) 3.5 - 5.0 g/dL   AST 25 15 - 41 U/L   ALT 20 0 - 44 U/L    Comment: RESULTS CONFIRMED BY MANUAL DILUTION   Alkaline Phosphatase 146 (H) 38 - 126 U/L  Total Bilirubin 0.8 0.3 - 1.2 mg/dL   GFR, Estimated >35 >68 mL/min    Comment: (NOTE) Calculated using the CKD-EPI Creatinine Equation (2021)    Anion gap 17 (H) 5 - 15    Comment: Performed at [facility], [address], [place], [place] [address]  CBC WITH DIFFERENTIAL     Status: Abnormal   Collection Time: 03/27/2021 11:20 AM  Result Value Ref Range   WBC 5.5 4.0 - 10.5 K/uL   RBC 4.45 4.22 - 5.81 MIL/uL   Hemoglobin 11.8 (L) 13.0 - 17.0 g/dL   HCT 72.9 (L) 02.1 - 11.5 %   MCV 82.9 80.0 - 100.0 fL   MCH 26.5 26.0 - 34.0 pg   MCHC 32.0 30.0 - 36.0 g/dL   RDW 52.0 80.2 - 23.3 %   Platelets 221 150 - 400 K/uL   nRBC 0.0 0.0 - 0.2 %   Neutrophils Relative % 89 %   Neutro Abs 4.9 1.7 - 7.7 K/uL   Lymphocytes Relative 8 %   Lymphs Abs 0.4 (L) 0.7 - 4.0 K/uL   Monocytes Relative 3 %   Monocytes Absolute 0.2 0.1 - 1.0 K/uL   Eosinophils Relative 0 %    Eosinophils Absolute 0.0 0.0 - 0.5 K/uL   Basophils Relative 0 %   Basophils Absolute 0.0 0.0 - 0.1 K/uL   WBC Morphology See Note     Comment: Increased Bands. >20% Bands   nRBC 0 0 /100 WBC   Abs Immature Granulocytes 0.00 0.00 - 0.07 K/uL   Tear Drop Cells PRESENT     Comment: Performed at [facility], [address] [place], [place] [address]  Protime-INR     Status: None   Collection Time: 03/25/2021 11:20 AM  Result Value Ref Range   Prothrombin Time 14.2 11.4 - 15.2 seconds   INR 1.1 0.8 - 1.2    Comment: (NOTE) INR goal varies based on device and disease states. Performed at [facility], [address] [place], [place] [address]   APTT     Status: Abnormal   Collection Time: 03/20/2021 11:20 AM  Result Value Ref Range   aPTT 23 (L) 24 - 36 seconds    Comment: Performed at [facility], [address] [place], [place] [address]  Troponin I (High Sensitivity)     Status: None   Collection Time: 03/27/2021 11:20 AM  Result Value Ref Range   Troponin I (High Sensitivity) 9 <18 ng/L    Comment: (NOTE) Elevated high sensitivity troponin I (hsTnI) values and significant  changes across serial measurements may suggest ACS but many other  chronic and acute conditions are known to elevate hsTnI results.  Refer to the Links section for chest pain algorithms and additional  guidance. Performed at [facility], [address] [place], [place] [address]   I-Stat venous blood gas, ED     Status: Abnormal   Collection Time: 03/26/2021 11:36 AM  Result Value Ref Range   pH, Ven 7.282 7.250 - 7.430   pCO2, Ven 38.8 (L) 44.0 - 60.0 mmHg   pO2, Ven 47.0 (H) 32.0 - 45.0 mmHg   Bicarbonate 18.3 (L) 20.0 - 28.0 mmol/L   TCO2 19 (L) 22 - 32 mmol/L   O2 Saturation 78.0 %   Acid-base deficit 8.0 (H) 0.0 - 2.0 mmol/L   Sodium 138 135 - 145 mmol/L   Potassium 3.6 3.5 - 5.1 mmol/L  Calcium, Ion 1.12 (L) 1.15 - 1.40 mmol/L   HCT 38.0 (L) 39.0 - 52.0 %   Hemoglobin  12.9 (L) 13.0 - 17.0 g/dL   Sample type VENOUS   Lactic acid, plasma     Status: Abnormal   Collection Time: 04/12/2021 11:42 AM  Result Value Ref Range   Lactic Acid, Venous 5.3 (HH) 0.5 - 1.9 mmol/L    Comment: CRITICAL RESULT CALLED TO, READ BACK BY AND VERIFIED WITH:  Adriana Reams, RN, 1235, 04/09/2021, E. ADEDOKUN. Performed at [facility], [address], [place], [place] [address]   Ammonia     Status: None   Collection Time: 03/28/2021 11:42 AM  Result Value Ref Range   Ammonia <10 9 - 35 umol/L    Comment: REPEATED TO VERIFY Performed at [facility], [address], [place], [place] [address]   Troponin I (High Sensitivity)     Status: None   Collection Time: 04/01/2021  3:20 PM  Result Value Ref Range   Troponin I (High Sensitivity) 11 <18 ng/L    Comment: (NOTE) Elevated high sensitivity troponin I (hsTnI) values and significant  changes across serial measurements may suggest ACS but many other  chronic and acute conditions are known to elevate hsTnI results.  Refer to the "Links" section for chest pain algorithms and additional  guidance. Performed at [facility], [address] [place], [place] [address]   Lactic acid, plasma     Status: Abnormal   Collection Time: 04/04/2021  4:21 PM  Result Value Ref Range   Lactic Acid, Venous 2.8 (HH) 0.5 - 1.9 mmol/L    Comment: CRITICAL VALUE NOTED.  VALUE IS CONSISTENT WITH PREVIOUSLY REPORTED AND CALLED VALUE. Performed at [facility], [address], [place], [place] [address]   Hemoglobin A1c     Status: Abnormal   Collection Time: 04/09/2021  4:48 PM  Result Value Ref Range   Hgb A1c MFr Bld 8.3 (H) 4.8 - 5.6 %    Comment: (NOTE) Pre diabetes:          5.7%-6.4%  Diabetes:              >6.4%  Glycemic control for   <7.0% adults with diabetes    Mean Plasma Glucose 191.51 mg/dL    Comment: Performed at [facility], [address], [place], [place] [address]  Urinalysis, Routine w reflex  microscopic     Status: Abnormal   Collection Time: 04/14/2021  4:56 PM  Result Value Ref Range   Color, Urine YELLOW YELLOW   APPearance CLEAR CLEAR   Specific Gravity, Urine 1.027 1.005 - 1.030   pH 5.0 5.0 - 8.0   Glucose, UA 150 (A) NEGATIVE mg/dL   Hgb urine dipstick NEGATIVE NEGATIVE   Bilirubin Urine NEGATIVE NEGATIVE   Ketones, ur 20 (A) NEGATIVE mg/dL   Protein, ur 30 (A) NEGATIVE mg/dL   Nitrite NEGATIVE NEGATIVE   Leukocytes,Ua NEGATIVE NEGATIVE   RBC / HPF 0-5 0 - 5 RBC/hpf   WBC, UA 0-5 0 - 5 WBC/hpf   Bacteria, UA NONE SEEN NONE SEEN    Comment: Performed at [facility], [address], [place], [place] [address]    DG Chest 1 View  Result Date: 03/28/2021 CLINICAL DATA:  Hypotension I95.9 (ICD-10-CM) Questionable sepsis. EXAM: CHEST  1 VIEW COMPARISON:  02/15/2021. FINDINGS: Patchy airspace opacities throughout the left lung and in the right mid to  upper lung, compatible with multifocal pneumonia. No visible pneumothorax or pleural effusion. Cardiomediastinal silhouette is within normal limits. CABG and median sternotomy. No acute osseous abnormality. IMPRESSION: Patchy airspace opacities throughout the left lung and in the right mid to upper lung, compatible with multifocal pneumonia. Electronically Signed   By: Feliberto Harts M.D.   On: 03/31/2021 12:23   CT HEAD WO CONTRAST ( )  Result Date: 04/08/2021 CLINICAL DATA:  Delirium EXAM: CT HEAD WITHOUT CONTRAST TECHNIQUE: Contiguous axial images were obtained from the base of the skull through the vertex without intravenous contrast. COMPARISON:  Brain MRI 09/20/2020, CT head 01/30/2021 FINDINGS: Brain: There is no acute intracranial hemorrhage, extra-axial fluid collection, or acute infarct. Parenchymal volume is normal.  The ventricles are stable in size. There is no mass lesion.  There is no midline shift Vascular: No hyperdense vessel or unexpected calcification. Skull: Normal. Negative for fracture or focal  lesion. Sinuses/Orbits: Imaged paranasal sinuses are clear. The globes and orbits are unremarkable. Other: None. IMPRESSION: No acute intracranial pathology. Electronically Signed   By: Lesia Hausen M.D.   On: 04/03/2021 12:30   DG Foot 2 Views Right  Result Date: 03/18/2021 CLINICAL DATA:  Status post amputation of fourth and fifth toes, possible sepsis EXAM: RIGHT FOOT - 2 VIEW COMPARISON:  Foot radiographs 03/07/2021 FINDINGS: Postsurgical changes reflecting amputation of the distal portions of the fourth and fifth metatarsals and the fourth and fifth toes are again seen. There is no evidence of osseous irregularity or destruction. There is lucency within the soft tissues adjacent to the third toe MTP joint which appears similar to the prior study and may reflect overlapping soft tissues. The soft tissues are otherwise unremarkable. IMPRESSION: 1. Status post amputation of the distal portions of the fourth and fifth metatarsals and associated toes with no radiographic evidence of acute osseous abnormality or destruction. MRI could be considered for further evaluation of possible osteomyelitis as indicated. 2. Possible soft tissue gas along the lateral aspect of the third toe MTP joint. This is similar to the appearance on the prior radiograph and may reflect overlapping soft tissue; however, correlate with physical exam to assess for wound/laceration in this location. Infection with a gas-forming organism cannot be excluded radiographically. Electronically Signed   By: Lesia Hausen M.D.   On: 04/01/2021 12:26    Review of Systems Blood pressure (!) 87/64, pulse (!) 140, temperature 99.1 F (37.3 C), temperature source Oral, resp. rate (!) 44, height 5\' 7"  (1.702 m), weight 58.1 kg, SpO2 100 %. Physical Exam  Dermatological: Full-thickness ulceration noted on the foot only at the dehiscence.  There does appear to be some increased granulation tissue present compared to last week with the distal portion  appears to be fibrotic and necrotic.  Rim of erythema without any ascending cellulitis.  Unable to identify any areas of fluctuation or crepitation.  Small amount of serous drainage expressed today.       Vascular: Dorsalis Pedis artery and Posterior Tibial artery pedal pulses are palpable bilateral with immedate capillary fill time.  Calves are supple.  Neruologic: Decree sensation  Musculoskeletal: No pain on exam  Assessment/Plan: At this point I recommend continued broad-spectrum antibiotics.  Although I do think he benefit from surgical debridement, further amputation I do not believe that the wound is currently the cause of his sepsis but we need to keep a close eye on this.  X-rays were negative for osteomyelitis but did show some concern for possible soft tissue emphysema.  Due to this correlates to the wound.  MRI is pending.  Once stable consider surgical intervention while inpatient if able.  I have treated the patient's wife via phone and she had no further questions.  Podiatry continue to follow.  I discussed the case with Dr. Louann Sjogren who will follow the patient as I will not be able to see him this week.   [name], DPM  [name] Apr 05, 2021, 6:08 PM

## 2021-04-02 NOTE — ED Notes (Signed)
Pt placed on 3L for sats at 88%

## 2021-04-02 NOTE — Progress Notes (Signed)
Paged by ED nursing staff that patient was hypotensive.  Evaluated at bedside, patient's blood pressures were stable 116/87.  Saturating well on nonrebreather.  Additional lactated Ringer bolus ordered as well as continuous LR 100 cc/h.  Night team will continue to monitor patient.

## 2021-04-02 NOTE — Consult Note (Signed)
WOC Nurse Consult Note: Reason for Consult:Consulted for the provision of Orders/guidance for Bedside RN regarding right foot. Fourth and 5th digit amputation by Dr.l Ardelle Anton, follow up in outpatient wound care center x2 by Dr. Leanord Hawking, most recently on 03/29/21.  I have communicated with the patient's primary team that a consult to Dr. Ardelle Anton while in house is recommended. Wound type:Surgical Pressure Injury POA: N/A Measurement:Per Dr. Leanord Hawking on 03/29/21: Right amputation site:  4.5cm x 1.5cm x 0.9cm Right lateral foot: 0.9cm x 1.2cm x 0.3cm Wound bed: Rd, moist with 30% nonviable tissue a the most distal area of the amputation site Drainage (amount, consistency, odor) moderate serous Periwound:erythematous, edematous Dressing procedure/placement/frequency: I have continued the orders provided by dr., Leanord Hawking in the outpatient setting, I.e., for collagenase to be applied to the nonviable tissue and topped with NS moistened gauze dressing followed by a dry dressing and secured with Kerlix roll gauze/paper tape.  The lateral foot wound will be covered with a xeroform gauze topped with dry gauze and included in the wrapping for the amputation site wound. A pressure redistribution heel boot is provided for the right foot.  A sacral foam is to be placed for pressure injury prevention.  It is recommended via Secure Chat that Dr. Ardelle Anton be consulted for wound care in house.  WOC nursing team will not follow, but will remain available to this patient, the nursing and medical teams.  Please re-consult if needed. Thanks, Ladona Mow, MSN, RN, GNP, [place]  Pager# [phone]

## 2021-04-03 ENCOUNTER — Inpatient Hospital Stay (HOSPITAL_COMMUNITY): Payer: Medicaid Other

## 2021-04-03 ENCOUNTER — Inpatient Hospital Stay: Payer: Self-pay

## 2021-04-03 DIAGNOSIS — U071 COVID-19: Secondary | ICD-10-CM

## 2021-04-03 DIAGNOSIS — J189 Pneumonia, unspecified organism: Secondary | ICD-10-CM | POA: Diagnosis present

## 2021-04-03 DIAGNOSIS — J9601 Acute respiratory failure with hypoxia: Secondary | ICD-10-CM

## 2021-04-03 LAB — I-STAT VENOUS BLOOD GAS, ED
Acid-base deficit: 3 mmol/L — ABNORMAL HIGH (ref 0.0–2.0)
Bicarbonate: 18.7 mmol/L — ABNORMAL LOW (ref 20.0–28.0)
Calcium, Ion: 1.05 mmol/L — ABNORMAL LOW (ref 1.15–1.40)
HCT: 31 % — ABNORMAL LOW (ref 39.0–52.0)
Hemoglobin: 10.5 g/dL — ABNORMAL LOW (ref 13.0–17.0)
O2 Saturation: 89 %
Potassium: 3 mmol/L — ABNORMAL LOW (ref 3.5–5.1)
Sodium: 137 mmol/L (ref 135–145)
TCO2: 19 mmol/L — ABNORMAL LOW (ref 22–32)
pCO2, Ven: 23.7 mmHg — ABNORMAL LOW (ref 44.0–60.0)
pH, Ven: 7.505 — ABNORMAL HIGH (ref 7.250–7.430)
pO2, Ven: 49 mmHg — ABNORMAL HIGH (ref 32.0–45.0)

## 2021-04-03 LAB — CBC WITH DIFFERENTIAL/PLATELET
Abs Immature Granulocytes: 0.2 10*3/uL — ABNORMAL HIGH (ref 0.00–0.07)
Basophils Absolute: 0 10*3/uL (ref 0.0–0.1)
Basophils Relative: 1 %
Eosinophils Absolute: 0 10*3/uL (ref 0.0–0.5)
Eosinophils Relative: 0 %
HCT: 33.5 % — ABNORMAL LOW (ref 39.0–52.0)
Hemoglobin: 10.9 g/dL — ABNORMAL LOW (ref 13.0–17.0)
Lymphocytes Relative: 20 %
Lymphs Abs: 0.5 10*3/uL — ABNORMAL LOW (ref 0.7–4.0)
MCH: 26.5 pg (ref 26.0–34.0)
MCHC: 32.5 g/dL (ref 30.0–36.0)
MCV: 81.5 fL (ref 80.0–100.0)
Metamyelocytes Relative: 6 %
Monocytes Absolute: 0.2 10*3/uL (ref 0.1–1.0)
Monocytes Relative: 8 %
Neutro Abs: 1.6 10*3/uL — ABNORMAL LOW (ref 1.7–7.7)
Neutrophils Relative %: 65 %
Platelets: 183 10*3/uL (ref 150–400)
RBC: 4.11 MIL/uL — ABNORMAL LOW (ref 4.22–5.81)
RDW: 15.2 % (ref 11.5–15.5)
WBC: 2.5 10*3/uL — ABNORMAL LOW (ref 4.0–10.5)
nRBC: 0 % (ref 0.0–0.2)
nRBC: 0 /100 WBC

## 2021-04-03 LAB — COMPREHENSIVE METABOLIC PANEL
ALT: 17 U/L (ref 0–44)
AST: 30 U/L (ref 15–41)
Albumin: 1.9 g/dL — ABNORMAL LOW (ref 3.5–5.0)
Alkaline Phosphatase: 67 U/L (ref 38–126)
Anion gap: 9 (ref 5–15)
BUN: 20 mg/dL (ref 6–20)
CO2: 20 mmol/L — ABNORMAL LOW (ref 22–32)
Calcium: 8 mg/dL — ABNORMAL LOW (ref 8.9–10.3)
Chloride: 105 mmol/L (ref 98–111)
Creatinine, Ser: 0.79 mg/dL (ref 0.61–1.24)
GFR, Estimated: >60 mL/min (ref >60)
Glucose, Bld: 121 mg/dL — ABNORMAL HIGH (ref 70–99)
Potassium: 3 mmol/L — ABNORMAL LOW (ref 3.5–5.1)
Sodium: 134 mmol/L — ABNORMAL LOW (ref 135–145)
Total Bilirubin: 0.7 mg/dL (ref 0.3–1.2)
Total Protein: 5 g/dL — ABNORMAL LOW (ref 6.5–8.1)

## 2021-04-03 LAB — BLOOD GAS, ARTERIAL
Acid-base deficit: 3 mmol/L — ABNORMAL HIGH (ref 0.0–2.0)
Bicarbonate: 20.7 mmol/L (ref 20.0–28.0)
Drawn by: 38235
FIO2: 100
O2 Saturation: 96.3 %
Patient temperature: 38.7
pCO2 arterial: 34.8 mmHg (ref 32.0–48.0)
pH, Arterial: 7.401 (ref 7.350–7.450)
pO2, Arterial: 89.3 mmHg (ref 83.0–108.0)

## 2021-04-03 LAB — LACTIC ACID, PLASMA
Lactic Acid, Venous: 3.2 mmol/L (ref 0.5–1.9)
Lactic Acid, Venous: 2.7 mmol/L (ref 0.5–1.9)

## 2021-04-03 LAB — FERRITIN: Ferritin: 414 ng/mL — ABNORMAL HIGH (ref 24–336)

## 2021-04-03 LAB — POCT I-STAT 7, (LYTES, BLD GAS, ICA,H+H)
Acid-base deficit: 3 mmol/L — ABNORMAL HIGH (ref 0.0–2.0)
Acid-base deficit: 6 mmol/L — ABNORMAL HIGH (ref 0.0–2.0)
Bicarbonate: 16.9 mmol/L — ABNORMAL LOW (ref 20.0–28.0)
Bicarbonate: 20.3 mmol/L (ref 20.0–28.0)
Calcium, Ion: 1.1 mmol/L — ABNORMAL LOW (ref 1.15–1.40)
Calcium, Ion: 1.17 mmol/L (ref 1.15–1.40)
HCT: 27 % — ABNORMAL LOW (ref 39.0–52.0)
HCT: 30 % — ABNORMAL LOW (ref 39.0–52.0)
Hemoglobin: 9.2 g/dL — ABNORMAL LOW (ref 13.0–17.0)
Hemoglobin: 10.2 g/dL — ABNORMAL LOW (ref 13.0–17.0)
O2 Saturation: 100 %
O2 Saturation: 91 %
Patient temperature: 97.5
Patient temperature: 97.7
Potassium: 2.9 mmol/L — ABNORMAL LOW (ref 3.5–5.1)
Potassium: 3.2 mmol/L — ABNORMAL LOW (ref 3.5–5.1)
Sodium: 131 mmol/L — ABNORMAL LOW (ref 135–145)
Sodium: 135 mmol/L (ref 135–145)
TCO2: 17 mmol/L — ABNORMAL LOW (ref 22–32)
TCO2: 22 mmol/L (ref 22–32)
pCO2 arterial: 17 mmHg — CL (ref 32.0–48.0)
pCO2 arterial: 42.5 mmHg (ref 32.0–48.0)
pH, Arterial: 7.605 (ref 7.350–7.450)
pH, Arterial: 7.285 — ABNORMAL LOW (ref 7.350–7.450)
pO2, Arterial: 148 mmHg — ABNORMAL HIGH (ref 83.0–108.0)
pO2, Arterial: 66 mmHg — ABNORMAL LOW (ref 83.0–108.0)

## 2021-04-03 LAB — GLUCOSE, CAPILLARY
Glucose-Capillary: 106 mg/dL — ABNORMAL HIGH (ref 70–99)
Glucose-Capillary: 128 mg/dL — ABNORMAL HIGH (ref 70–99)
Glucose-Capillary: 136 mg/dL — ABNORMAL HIGH (ref 70–99)
Glucose-Capillary: 136 mg/dL — ABNORMAL HIGH (ref 70–99)
Glucose-Capillary: 139 mg/dL — ABNORMAL HIGH (ref 70–99)

## 2021-04-03 LAB — BASIC METABOLIC PANEL
Anion gap: 12 (ref 5–15)
BUN: 19 mg/dL (ref 6–20)
CO2: 21 mmol/L — ABNORMAL LOW (ref 22–32)
Calcium: 8 mg/dL — ABNORMAL LOW (ref 8.9–10.3)
Chloride: 102 mmol/L (ref 98–111)
Creatinine, Ser: 0.82 mg/dL (ref 0.61–1.24)
GFR, Estimated: >60 mL/min (ref >60)
Glucose, Bld: 147 mg/dL — ABNORMAL HIGH (ref 70–99)
Potassium: 3.7 mmol/L (ref 3.5–5.1)
Sodium: 135 mmol/L (ref 135–145)

## 2021-04-03 LAB — STREP PNEUMONIAE URINARY ANTIGEN: Strep Pneumo Urinary Antigen: NEGATIVE

## 2021-04-03 LAB — D-DIMER, QUANTITATIVE: D-Dimer, Quant: 4.95 ug/mL-FEU — ABNORMAL HIGH (ref 0.00–0.50)

## 2021-04-03 LAB — MAGNESIUM: Magnesium: 0.8 mg/dL — CL (ref 1.7–2.4)

## 2021-04-03 LAB — C-REACTIVE PROTEIN: CRP: 28.7 mg/dL — ABNORMAL HIGH (ref <1.0)

## 2021-04-03 IMAGING — DX DG CHEST 1V PORT
1 series · 1 of 1 positions shown · non-contrast
Comparison: CT of the chest acquired on [DATE].

CLINICAL DATA: COVID positive on [DATE].  Respiratory distress.

EXAM:
PORTABLE CHEST 1 VIEW

[chest ap]
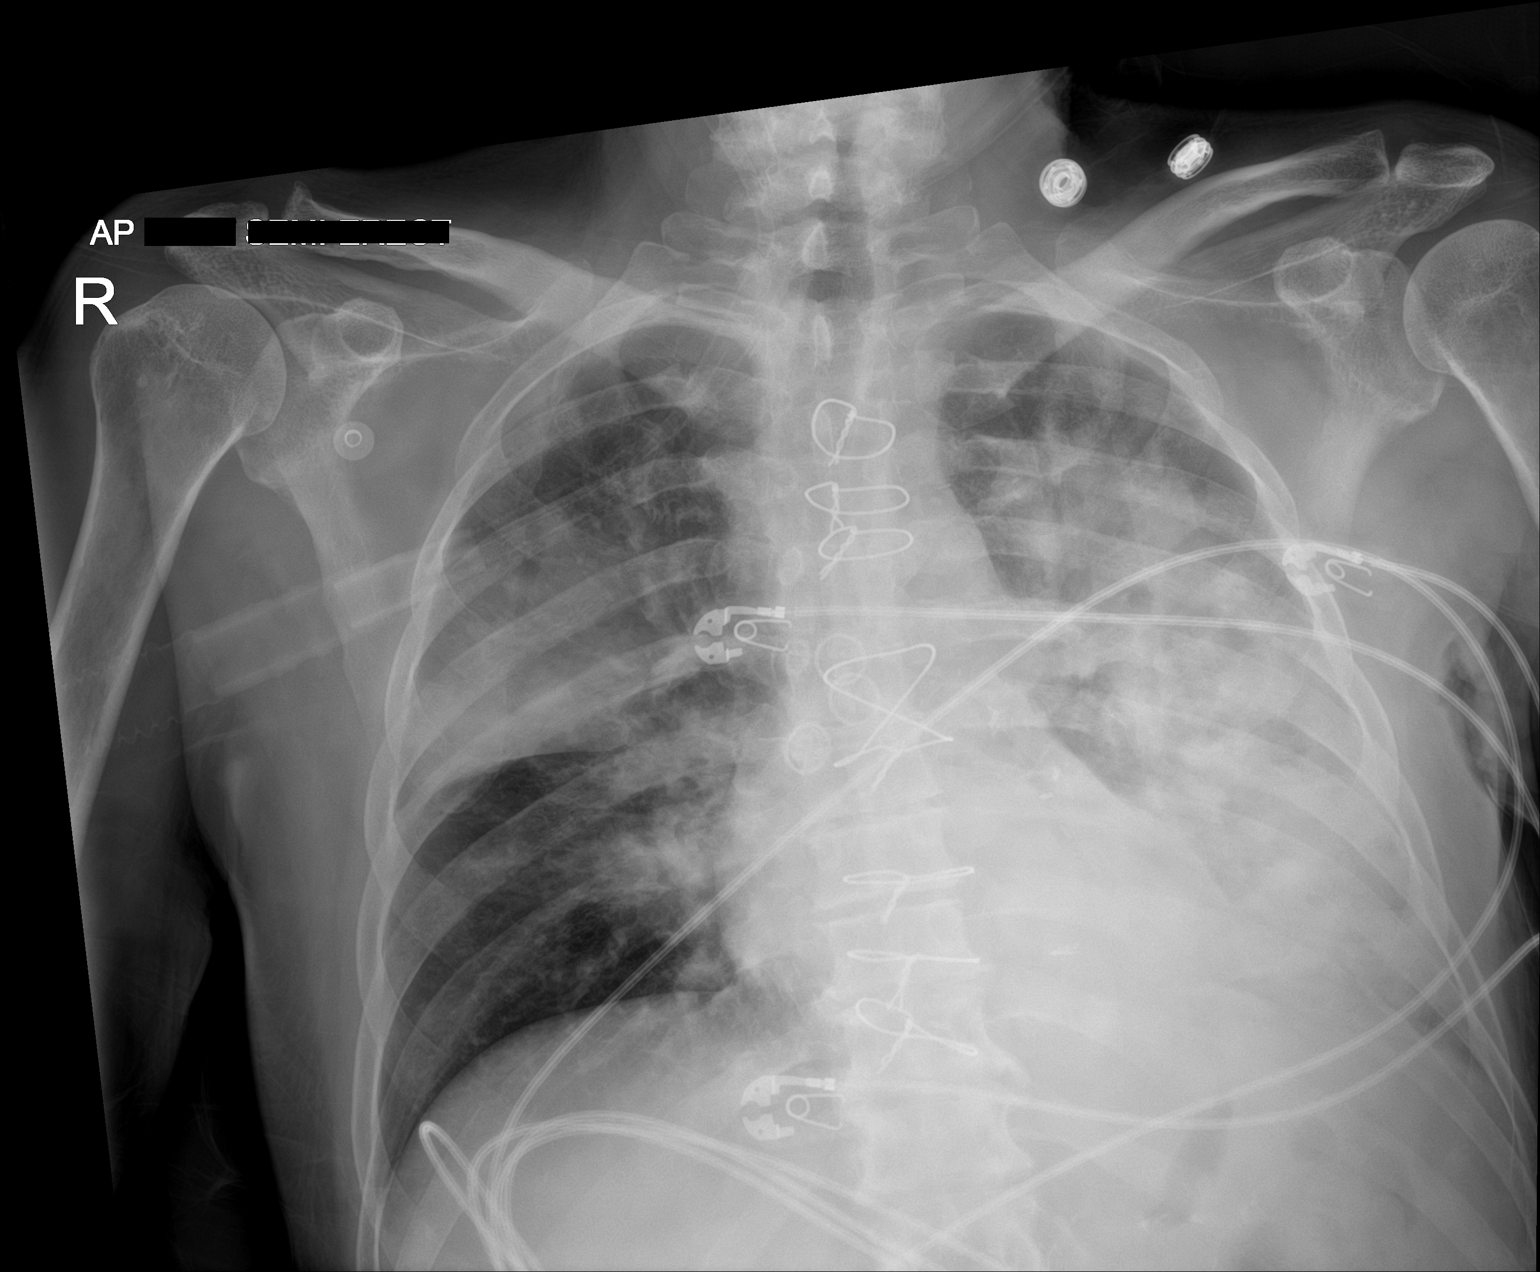

[1 of 1 positions shown; findings below may reference images not displayed]

FINDINGS: Post median sternotomy for CABG. EKG leads projecting over the
chest.

Severe consolidative changes worse in the LEFT chest throughout the
LEFT chest and in the RIGHT upper lobe with patchy opacities
elsewhere showing a similar appearance to recent CT of the chest.
Cardiomediastinal contours are stable.

No visible pneumothorax.

On limited assessment there is no acute skeletal process.
IMPRESSION: Multifocal pneumonia with similar appearance to the chest CT
acquired at [DATE] a.m.

## 2021-04-03 IMAGING — CT CT ANGIO CHEST
2 of 7 series · 18 of 46 positions shown · IV contrast (APPLIED)
Comparison: Chest radiograph dated [DATE].

CLINICAL DATA: COVID positive, tachycardia, respiratory distress

EXAM:
CT ANGIOGRAPHY CHEST WITH CONTRAST
TECHNIQUE: Multidetector CT imaging of the chest was performed using the
standard protocol during bolus administration of intravenous
contrast. Multiplanar CT image reconstructions and MIPs were
obtained to evaluate the vascular anatomy.
CONTRAST:  55mL OMNIPAQUE IOHEXOL 350 MG/ML SOLN

[Series 7: thins · axial · 0.63mm/px · z∈[-403,-184]mm · 15 of 354 slices shown]
[im 20/354  lung]
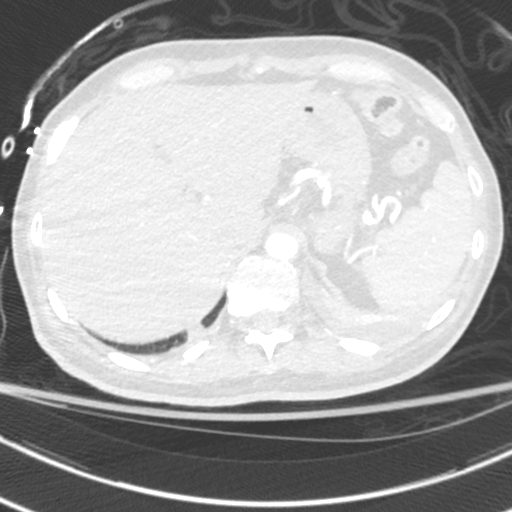
[im 40/354  soft-tissue]
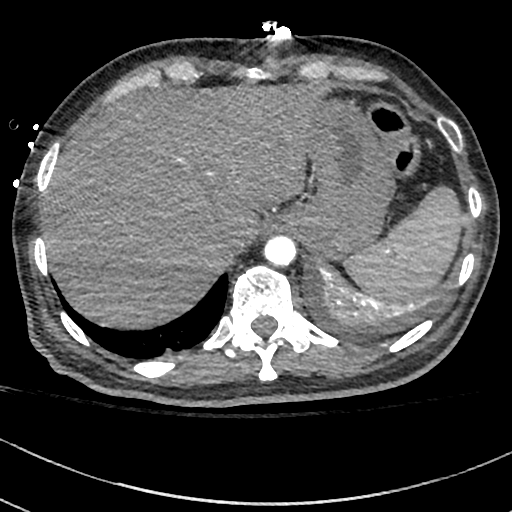
[im 59/354  lung]
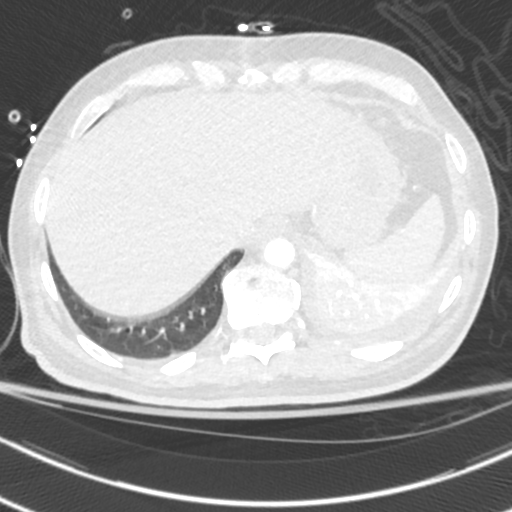
[im 79/354  soft-tissue]
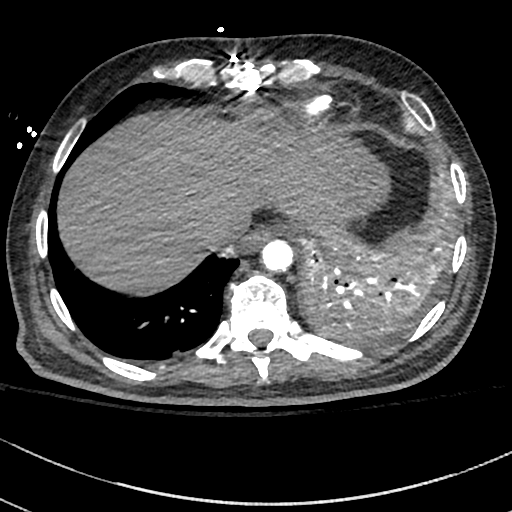
[im 118/354  lung]
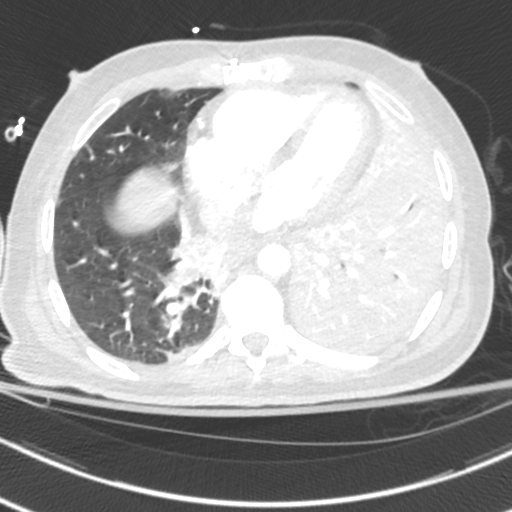
[im 138/354  soft-tissue]
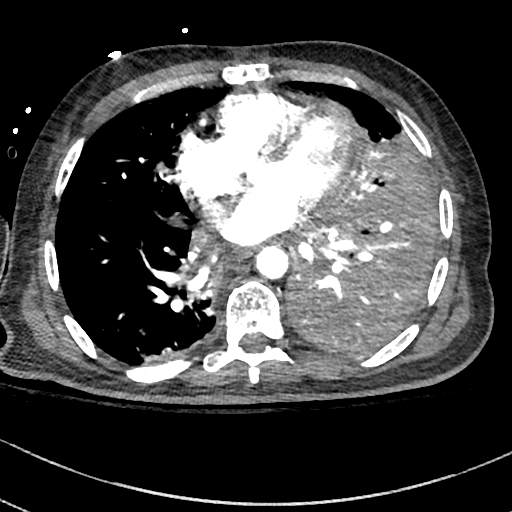
[im 157/354  lung]
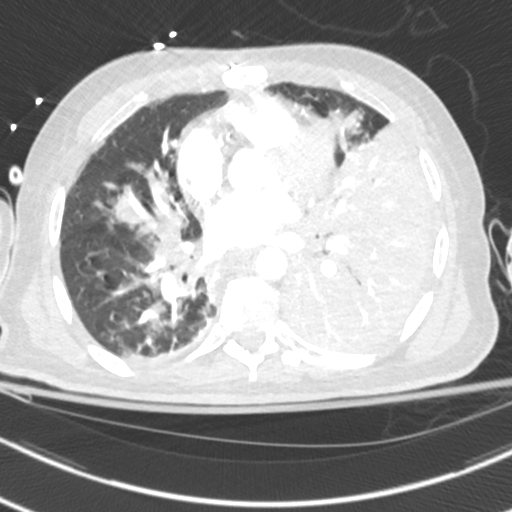
[im 177/354  soft-tissue]
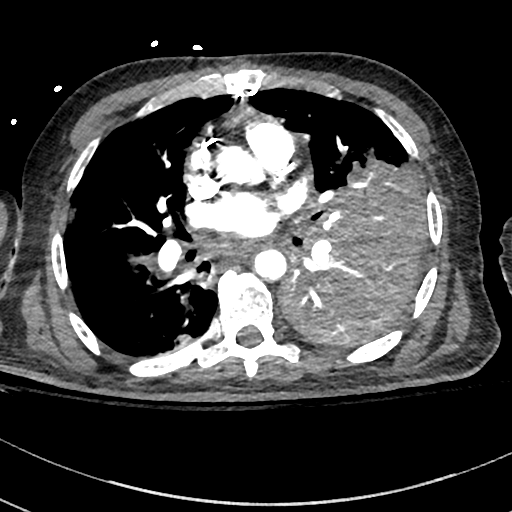
[im 197/354  lung]
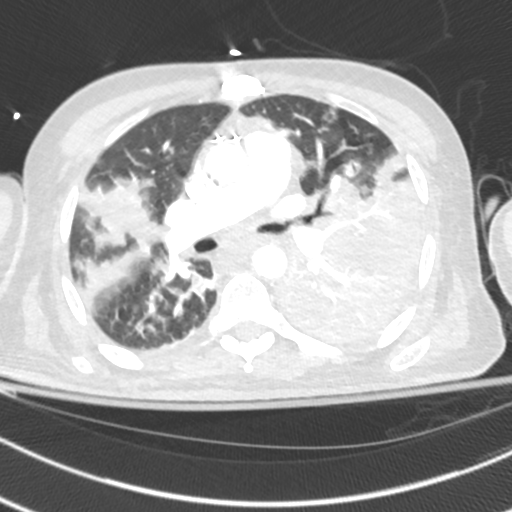
[im 216/354  soft-tissue]
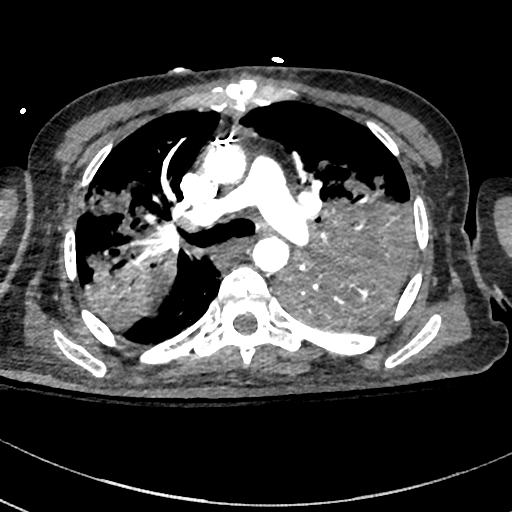
[im 236/354  lung]
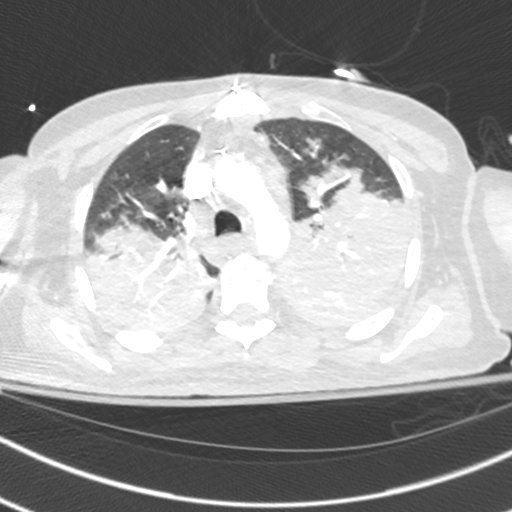
[im 275/354  soft-tissue]
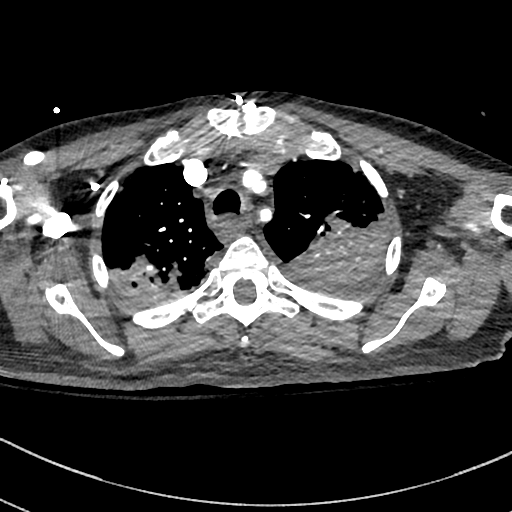
[im 295/354  lung]
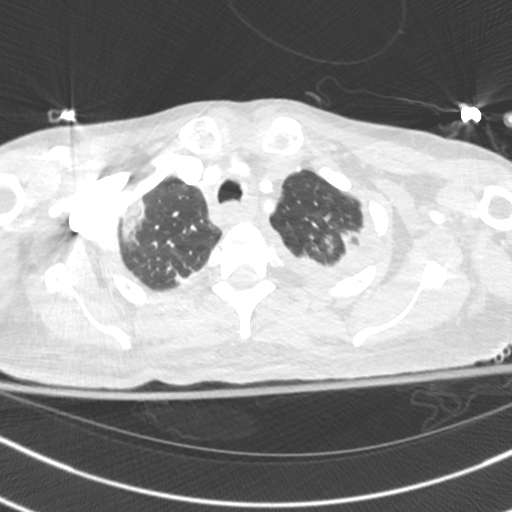
[im 314/354  soft-tissue]
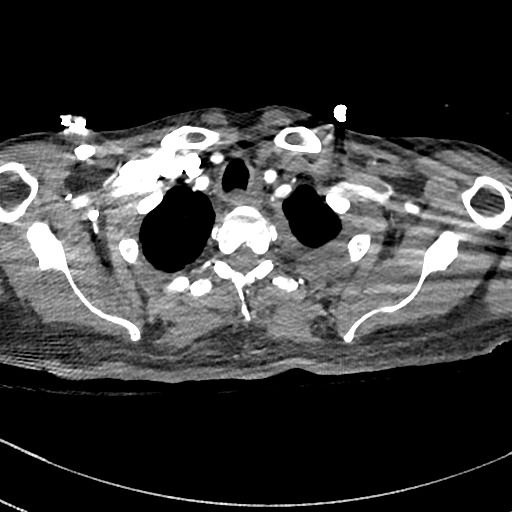
[im 334/354  lung]
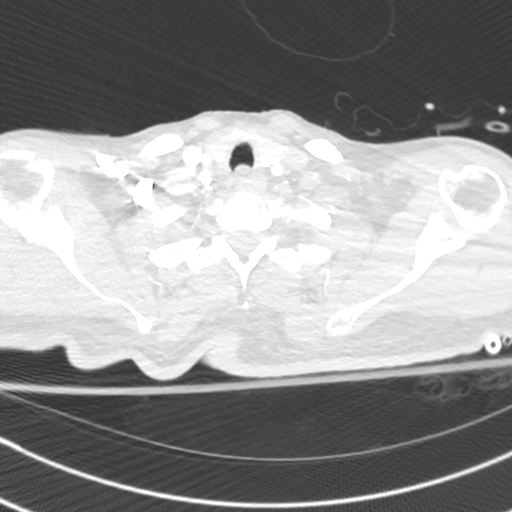

[Series 8: cor · coronal · 0.50mm/px · 3 of 120 slices shown]
[im 30/120  soft-tissue]
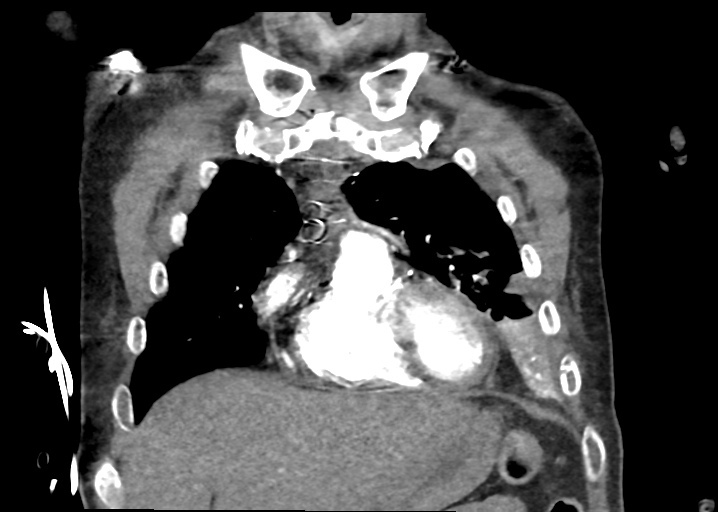
[im 60/120  soft-tissue]
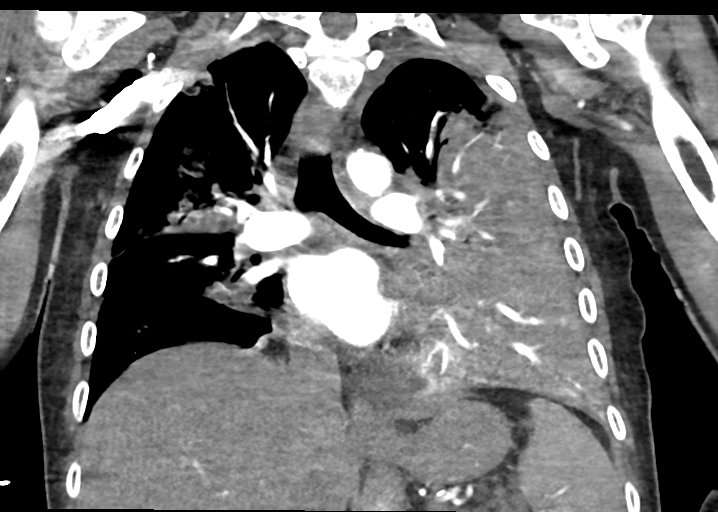
[im 90/120  soft-tissue]
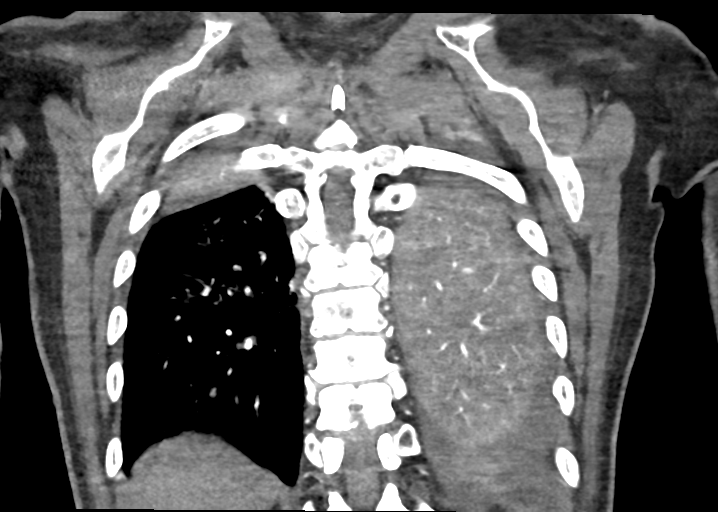

[18 of 46 positions shown; findings below may reference images not displayed]

FINDINGS: Cardiovascular: Satisfactory opacification of the bilateral
pulmonary arteries to the segmental level. No evidence of pulmonary
embolism.

Although not tailored for evaluation of the thoracic aorta, there is
no evidence of thoracic aortic aneurysm or dissection.

The heart is normal in size.  No pericardial effusion.

Coronary atherosclerosis of the LAD and right coronary artery.
Postsurgical changes related to prior CABG.

Mediastinum/Nodes: No suspicious mediastinal lymphadenopathy.

Visualized thyroid is unremarkable.

Lungs/Pleura: Multifocal pneumonia in all lobes, bilateral upper
lobe and left lower lobe predominant.

No suspicious pulmonary nodules.

Small left pleural effusion.

No pneumothorax.

Upper Abdomen: Visualized upper abdomen is grossly unremarkable.

Musculoskeletal: Mild degenerative changes of the visualized
thoracolumbar spine. Median sternotomy.

Review of the MIP images confirms the above findings.
IMPRESSION: No evidence of pulmonary embolism.

Multifocal pneumonia in this patient with known COVID, left lower
lobe predominant.

Small left pleural effusion.

Aortic Atherosclerosis ([JB]-[JB]).

## 2021-04-03 IMAGING — DX DG ABDOMEN 1V
1 series · 1 of 1 positions shown · non-contrast
Comparison: [DATE]

CLINICAL DATA: NG placement

EXAM:
ABDOMEN - 1 VIEW

[chest ap]
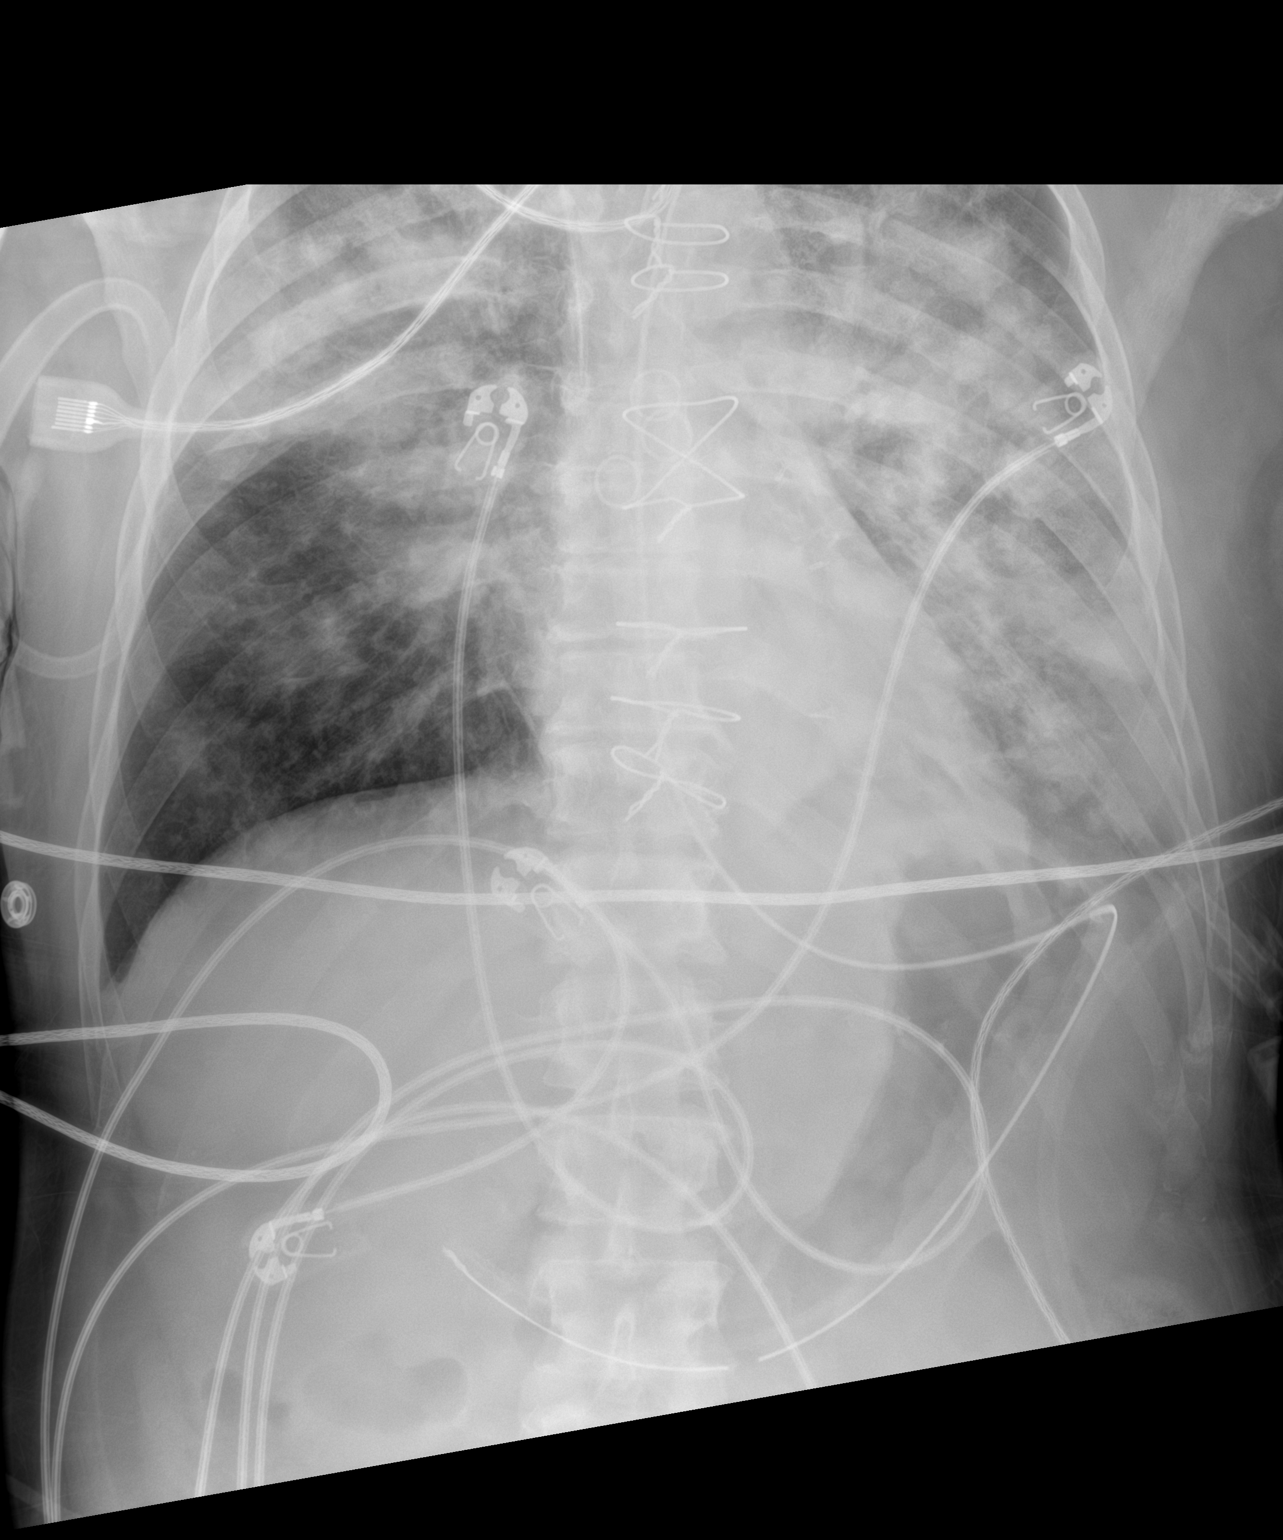

[1 of 1 positions shown; findings below may reference images not displayed]

FINDINGS: NG tube placed since earlier today. NG tube passes through the
stomach with the tip near the pylorus. Stomach is decompressed

Diffuse bilateral airspace disease left greater than right unchanged
from earlier today.
IMPRESSION: NG tip in the region of the gastric antrum or pylorus.

## 2021-04-03 MED ORDER — DOCUSATE SODIUM 50 MG/5ML PO LIQD: 100.0000 mg | Freq: Every day | ORAL | Status: DC | PRN

## 2021-04-03 MED ORDER — SODIUM CHLORIDE 0.9 % IV SOLN
250.0000 mL | INTRAVENOUS | Status: DC
  Administered 2021-04-03: 250 mL via INTRAVENOUS

## 2021-04-03 MED ORDER — DILTIAZEM HCL 25 MG/5ML IV SOLN
10.0000 mg | Freq: Once | INTRAVENOUS | Status: AC
  Administered 2021-04-03: 10 mg via INTRAVENOUS
  Filled 2021-04-03: qty 5

## 2021-04-03 MED ORDER — FENTANYL CITRATE (PF) 100 MCG/2ML IJ SOLN
INTRAMUSCULAR | Status: AC
  Filled 2021-04-03: qty 2

## 2021-04-03 MED ORDER — ACETAMINOPHEN 325 MG PO TABS
650.0000 mg | ORAL_TABLET | Freq: Four times a day (QID) | ORAL | Status: DC | PRN
  Administered 2021-04-04 – 2021-04-06 (×3): 650 mg
  Filled 2021-04-03 (×3): qty 2

## 2021-04-03 MED ORDER — SODIUM CHLORIDE 0.9 % IV SOLN
2.0000 g | Freq: Three times a day (TID) | INTRAVENOUS | Status: DC
  Administered 2021-04-03 – 2021-04-07 (×12): 2 g via INTRAVENOUS
  Filled 2021-04-03 (×12): qty 2

## 2021-04-03 MED ORDER — PHENYLEPHRINE 40 MCG/ML (10ML) SYRINGE FOR IV PUSH (FOR BLOOD PRESSURE SUPPORT)
PREFILLED_SYRINGE | INTRAVENOUS | Status: AC
  Administered 2021-04-03: 400 ug
  Filled 2021-04-03: qty 10

## 2021-04-03 MED ORDER — FENTANYL CITRATE (PF) 100 MCG/2ML IJ SOLN
INTRAMUSCULAR | Status: AC
  Administered 2021-04-03: 100 ug
  Filled 2021-04-03: qty 2

## 2021-04-03 MED ORDER — MIDAZOLAM HCL 2 MG/2ML IJ SOLN
INTRAMUSCULAR | Status: AC
  Filled 2021-04-03: qty 2

## 2021-04-03 MED ORDER — SODIUM CHLORIDE 0.9 % IV SOLN: INTRAVENOUS | Status: DC

## 2021-04-03 MED ORDER — PANTOPRAZOLE SODIUM 40 MG IV SOLR
40.0000 mg | Freq: Every day | INTRAVENOUS | Status: DC
  Administered 2021-04-03 – 2021-04-06 (×4): 40 mg via INTRAVENOUS
  Filled 2021-04-03 (×4): qty 40

## 2021-04-03 MED ORDER — INSULIN ASPART 100 UNIT/ML IJ SOLN
0.0000 [IU] | INTRAMUSCULAR | Status: DC
Start: 2021-04-03 — End: 2021-04-08
  Administered 2021-04-03 (×3): 2 [IU] via SUBCUTANEOUS
  Administered 2021-04-04: 3 [IU] via SUBCUTANEOUS
  Administered 2021-04-05 (×2): 8 [IU] via SUBCUTANEOUS
  Administered 2021-04-05: 3 [IU] via SUBCUTANEOUS
  Administered 2021-04-05: 5 [IU] via SUBCUTANEOUS
  Administered 2021-04-05: 3 [IU] via SUBCUTANEOUS
  Administered 2021-04-05: 8 [IU] via SUBCUTANEOUS
  Administered 2021-04-06: 2 [IU] via SUBCUTANEOUS

## 2021-04-03 MED ORDER — LACTATED RINGERS IV BOLUS
1000.0000 mL | Freq: Once | INTRAVENOUS | Status: AC
  Administered 2021-04-03: 1000 mL via INTRAVENOUS

## 2021-04-03 MED ORDER — NOREPINEPHRINE 4 MG/250ML-% IV SOLN: 0.0000 ug/min | INTRAVENOUS | Status: DC

## 2021-04-03 MED ORDER — POLYETHYLENE GLYCOL 3350 17 G PO PACK: 17.0000 g | PACK | Freq: Every day | ORAL | Status: DC | PRN

## 2021-04-03 MED ORDER — ROSUVASTATIN CALCIUM 20 MG PO TABS
20.0000 mg | ORAL_TABLET | Freq: Every day | ORAL | Status: DC
  Administered 2021-04-04 – 2021-04-07 (×4): 20 mg
  Filled 2021-04-03 (×4): qty 1

## 2021-04-03 MED ORDER — IOHEXOL 350 MG/ML SOLN
55.0000 mL | Freq: Once | INTRAVENOUS | Status: AC | PRN
  Administered 2021-04-03: 55 mL via INTRAVENOUS

## 2021-04-03 MED ORDER — BUSPIRONE HCL 5 MG PO TABS
10.0000 mg | ORAL_TABLET | Freq: Two times a day (BID) | ORAL | Status: DC
  Administered 2021-04-03 – 2021-04-07 (×8): 10 mg
  Filled 2021-04-03 (×8): qty 2

## 2021-04-03 MED ORDER — ACETAMINOPHEN 650 MG RE SUPP: 650.0000 mg | Freq: Four times a day (QID) | RECTAL | Status: DC | PRN

## 2021-04-03 MED ORDER — FENTANYL BOLUS VIA INFUSION
50.0000 ug | INTRAVENOUS | Status: DC | PRN
  Administered 2021-04-04: 100 ug via INTRAVENOUS
  Filled 2021-04-03: qty 100

## 2021-04-03 MED ORDER — POTASSIUM CHLORIDE 20 MEQ PO PACK
40.0000 meq | PACK | Freq: Once | ORAL | Status: AC
  Administered 2021-04-03: 40 meq
  Filled 2021-04-03: qty 2

## 2021-04-03 MED ORDER — ASPIRIN 81 MG PO CHEW
81.0000 mg | CHEWABLE_TABLET | Freq: Every day | ORAL | Status: DC
  Administered 2021-04-04 – 2021-04-07 (×4): 81 mg
  Filled 2021-04-03 (×4): qty 1

## 2021-04-03 MED ORDER — CHLORHEXIDINE GLUCONATE 0.12% ORAL RINSE (MEDLINE KIT)
15.0000 mL | Freq: Two times a day (BID) | OROMUCOSAL | Status: DC
  Administered 2021-04-03 – 2021-04-07 (×8): 15 mL via OROMUCOSAL

## 2021-04-03 MED ORDER — DOCUSATE SODIUM 100 MG PO CAPS
100.0000 mg | ORAL_CAPSULE | Freq: Two times a day (BID) | ORAL | Status: DC | PRN
Start: 2021-04-03 — End: 2021-04-03

## 2021-04-03 MED ORDER — PHENYLEPHRINE HCL-NACL 20-0.9 MG/250ML-% IV SOLN: 0.0000 ug/min | INTRAVENOUS | Status: DC

## 2021-04-03 MED ORDER — VANCOMYCIN HCL 1750 MG/350ML IV SOLN
1750.0000 mg | INTRAVENOUS | Status: DC
  Administered 2021-04-03 – 2021-04-06 (×4): 1750 mg via INTRAVENOUS
  Filled 2021-04-03 (×5): qty 350

## 2021-04-03 MED ORDER — CLOPIDOGREL BISULFATE 75 MG PO TABS
75.0000 mg | ORAL_TABLET | Freq: Every day | ORAL | Status: DC
  Administered 2021-04-04 – 2021-04-07 (×4): 75 mg
  Filled 2021-04-03 (×4): qty 1

## 2021-04-03 MED ORDER — FENTANYL 2500MCG IN NS 250ML (10MCG/ML) PREMIX INFUSION
50.0000 ug/h | INTRAVENOUS | Status: DC
  Administered 2021-04-03: 50 ug/h via INTRAVENOUS
  Administered 2021-04-04 – 2021-04-07 (×6): 200 ug/h via INTRAVENOUS
  Filled 2021-04-03 (×7): qty 250

## 2021-04-03 MED ORDER — ORAL CARE MOUTH RINSE
15.0000 mL | OROMUCOSAL | Status: DC
  Administered 2021-04-03 – 2021-04-07 (×38): 15 mL via OROMUCOSAL

## 2021-04-03 MED ORDER — CHLORHEXIDINE GLUCONATE CLOTH 2 % EX PADS
6.0000 | MEDICATED_PAD | Freq: Every day | CUTANEOUS | Status: DC
  Administered 2021-04-03 – 2021-04-07 (×5): 6 via TOPICAL

## 2021-04-03 MED ORDER — POLYETHYLENE GLYCOL 3350 17 G PO PACK
17.0000 g | PACK | Freq: Every day | ORAL | Status: DC
  Administered 2021-04-03 – 2021-04-07 (×5): 17 g
  Filled 2021-04-03 (×5): qty 1

## 2021-04-03 MED ORDER — MAGNESIUM SULFATE 4 GM/100ML IV SOLN
4.0000 g | Freq: Once | INTRAVENOUS | Status: AC
  Administered 2021-04-03: 4 g via INTRAVENOUS
  Filled 2021-04-03: qty 100

## 2021-04-03 MED ORDER — HEPARIN SODIUM (PORCINE) 5000 UNIT/ML IJ SOLN
5000.0000 [IU] | Freq: Three times a day (TID) | INTRAMUSCULAR | Status: DC
  Administered 2021-04-03 – 2021-04-06 (×10): 5000 [IU] via SUBCUTANEOUS
  Filled 2021-04-03 (×10): qty 1

## 2021-04-03 MED ORDER — PHENYLEPHRINE HCL-NACL 20-0.9 MG/250ML-% IV SOLN
25.0000 ug/min | INTRAVENOUS | Status: DC
  Administered 2021-04-03: 25 ug/min via INTRAVENOUS
  Administered 2021-04-04 (×4): 200 ug/min via INTRAVENOUS
  Filled 2021-04-03: qty 500
  Filled 2021-04-03 (×2): qty 250
  Filled 2021-04-03: qty 500

## 2021-04-03 MED ORDER — PROPOFOL 10 MG/ML IV BOLUS
0.5000 mg/kg | Freq: Once | INTRAVENOUS | Status: DC
  Filled 2021-04-03: qty 20

## 2021-04-03 MED ORDER — PROPOFOL 1000 MG/100ML IV EMUL
0.0000 ug/kg/min | INTRAVENOUS | Status: DC
  Administered 2021-04-03: 10 ug/kg/min via INTRAVENOUS
  Administered 2021-04-04: 20 ug/kg/min via INTRAVENOUS
  Administered 2021-04-05 – 2021-04-06 (×5): 40 ug/kg/min via INTRAVENOUS
  Filled 2021-04-03 (×3): qty 100
  Filled 2021-04-03: qty 200
  Filled 2021-04-03 (×3): qty 100

## 2021-04-03 MED ORDER — ROCURONIUM BROMIDE 10 MG/ML (PF) SYRINGE
PREFILLED_SYRINGE | INTRAVENOUS | Status: AC
  Administered 2021-04-03: 100 mg
  Filled 2021-04-03: qty 10

## 2021-04-03 MED ORDER — ROCURONIUM BROMIDE 50 MG/5ML IV SOLN
1.0000 mg/kg | Freq: Once | INTRAVENOUS | Status: DC
  Filled 2021-04-03: qty 5.97

## 2021-04-03 MED ORDER — ETOMIDATE 2 MG/ML IV SOLN
0.3000 mg/kg | Freq: Once | INTRAVENOUS | Status: DC
Start: 2021-04-03 — End: 2021-04-06

## 2021-04-03 MED ORDER — MAGNESIUM SULFATE 2 GM/50ML IV SOLN
2.0000 g | Freq: Once | INTRAVENOUS | Status: AC
  Administered 2021-04-03: 2 g via INTRAVENOUS
  Filled 2021-04-03: qty 50

## 2021-04-03 MED ORDER — GABAPENTIN 250 MG/5ML PO SOLN
300.0000 mg | Freq: Three times a day (TID) | ORAL | Status: DC
  Administered 2021-04-03 – 2021-04-07 (×13): 300 mg
  Filled 2021-04-03 (×17): qty 6

## 2021-04-03 MED ORDER — ETOMIDATE 2 MG/ML IV SOLN
INTRAVENOUS | Status: AC
  Filled 2021-04-03: qty 20

## 2021-04-03 MED ORDER — DOCUSATE SODIUM 50 MG/5ML PO LIQD
100.0000 mg | Freq: Two times a day (BID) | ORAL | Status: DC
  Administered 2021-04-03 – 2021-04-07 (×7): 100 mg
  Filled 2021-04-03 (×7): qty 10

## 2021-04-03 MED ORDER — NOREPINEPHRINE 4 MG/250ML-% IV SOLN
2.0000 ug/min | INTRAVENOUS | Status: DC
Start: 2021-04-03 — End: 2021-04-03
  Administered 2021-04-03: 2 ug/min via INTRAVENOUS
  Administered 2021-04-03: 6 ug/min via INTRAVENOUS
  Filled 2021-04-03 (×2): qty 250

## 2021-04-03 NOTE — Progress Notes (Signed)
Contacted [employer] in regards to patients elevated HR. Patient sedated per [facility] will continue to monitor at this time.

## 2021-04-03 NOTE — ED Notes (Signed)
Over last hour patient has become more responsive.

## 2021-04-03 NOTE — Procedures (Signed)
Intubation Procedure Note  [name]  [id number]  Oct 02, 1965  Date:04/03/21  Time:3:47 PM   Provider Performing:Ciarah Peace Marchia Bond    Procedure: Intubation (31500)  Indication(s) Respiratory Failure  Consent Risks of the procedure as well as the alternatives and risks of each were explained to the patient and/or caregiver.  Consent for the procedure was obtained and is signed in the bedside chart   Anesthesia Etomidate, Fentanyl, Rocuronium, and Propofol   Time Out Verified patient identification, verified procedure, site/side was marked, verified correct patient position, special equipment/implants available, medications/allergies/relevant history reviewed, required imaging and test results available.   Sterile Technique Usual hand hygeine, masks, and gloves were used   Procedure Description Patient positioned in bed supine.  Sedation given as noted above.  Patient was intubated with endotracheal tube using Glidescope.  View was Grade 1 full glottis .  Number of attempts was 1.  Colorimetric CO2 detector was consistent with tracheal placement.   Complications/Tolerance None; patient tolerated the procedure well. Chest X-ray is ordered to verify placement.   EBL Minimal   Specimen(s) None  [name], D.O.  Internal Medicine Resident, PGY-3 [facility]  Pager: [phone] 3:48 PM, 04/03/2021

## 2021-04-03 NOTE — Progress Notes (Signed)
eLink Physician-Brief Progress Note Patient Name: [name] DOB: 10-30-1965 MRN: [id number]   Date of Service  04/03/2021  HPI/Events of Note  Pt in afib rate 140's  eICU Interventions  - changing from LEVO to NEO - giving cardizem 10mg  IV x 1 - if still rapid (and still on pressors) will then start amio - mag this AM at 0740hrs was only 0.8. will give more mag - K this evening was 3.7.Marland Kitchen will give more K     Intervention Category Intermediate Interventions: Arrhythmia - evaluation and management  Marland Kitchen 04/03/2021, 9:49 PM

## 2021-04-03 NOTE — Progress Notes (Signed)
RT attempted ABG but was unsucessfull. MD made aware.

## 2021-04-03 NOTE — Progress Notes (Signed)
ABG as follows: PH 7.27 PCO2 43.4 PO2 68 HCO3 20.3 BE,B -6

## 2021-04-03 NOTE — Progress Notes (Addendum)
eLink Physician-Brief Progress Note Patient Name: [name] DOB: 07/20/1965 MRN: [id number]   Date of Service  04/03/2021  HPI/Events of Note  - on mech ventilation - on levo - K noted to be 3 - mag not checked - in/out of afib  eICU Interventions  - ordered KCL replacement - ordered mag 2g IV bolus regardless of mag level - ordered a mag level - if pt remains in afib/rvr would then need to change from levo to neo     Intervention Category Evaluation Type: New Patient Evaluation  Jacinta Shoe 04/03/2021, 6:12 AM

## 2021-04-03 NOTE — Progress Notes (Signed)
Critical care attending  note:  55 year old man who is critically ill due to acute hypoxic respiratory failure due to COVID-19 pneumonia.  Remote vaccination without recent booster.  On examination frail and pale.  Diffuse crackles bilaterally.  Increasing dyspnea over the course of the day with desaturations into the low 80s.  Increasingly agitated.  Assessment:  -Worsening hypoxia and increasing confusion work of breathing with high flow nasal cannula.  Will intubate patient.  ARDSnet ventilation. -Continue to titrate norepinephrine to keep MAP greater than 65 -Sedation with propofol and fentanyl to keep RASS -2, -3 -Patient appears volume contracted.  Gentle hydration.  CRITICAL CARE Performed by: Lynnell Catalan   Total critical care time: 40 minutes  Critical care time was exclusive of separately billable procedures and treating other patients.  Critical care was necessary to treat or prevent imminent or life-threatening deterioration.  Critical care was time spent personally by me on the following activities: development of treatment plan with patient and/or surrogate as well as nursing, discussions with consultants, evaluation of patient's response to treatment, examination of patient, obtaining history from patient or surrogate, ordering and performing treatments and interventions, ordering and review of laboratory studies, ordering and review of radiographic studies, pulse oximetry, re-evaluation of patient's condition and participation in multidisciplinary rounds.  Lynnell Catalan, MD [facility] ICU Physician [facility] [place] [facility]  Pager: [phone] Mobile: [phone] After hours: [phone].  04/03/2021, 5:28 PM

## 2021-04-03 NOTE — Progress Notes (Signed)
CLINICAL UPDATE:  Messaged by RN patient's SBP 70's x3 in each arm. Dr. Ned Card and I evaluated Mr. Thronton at bedside. His wife, Maralyn Sago, was also at bedside. Limited history available via patient given he is currently on NRB. He states he currently feels tired but otherwise no complaints. Patient's wife provided more history, including that Mr. Peretti has no known history of DVT/PE and has been immobile for the past few weeks.   Physical Exam: HR 140, BP 100/61, RR 30-40's, sating well on NRB General: Ill-appearing, no acute distress CV: Tachycardic, regular rhythm. No murmurs appreciated. Pulm: Anteriorly decreased breath sounds on R, diffuse rhonchi on L MSK: Bilateral lower extremities warm. Neuro: Somnolent, easily arousable, answers questions appropriately  Rei Medlen is 54yo w/ osteomyelitis, T2DM, HTN admitted with acute hypoxic respiratory failure likely 2/2 COVID-19 pneumonia. Prior to now, patient had maintained blood pressures w/ MAP's >70. However, Mr. Mesick has been persistently tachycardic 140-150's since arrival. He has received 5L bolus IVF in total along with maintenance fluids at 100-150cc/hr. Would expect some improvement in heart rate with continued intravenous fluids. Given his current pro-inflammatory state along with recent immobility, will obtain CTA chest to rule-out pulmonary embolism. With his continued increase work of breathing, persistent tachycardia, and hypotension despite IVF will consult PCCM. Discussed with PCCM PA, who will discuss with MD.   - CTA chest - PCCM consult  Evlyn Kanner, MD Internal Medicine PGY-2 [phone]

## 2021-04-03 NOTE — Procedures (Signed)
Central Venous Catheter Insertion Procedure Note  [name]  [id number]  April 18, 1966  Date:04/03/21  Time:4:54 PM   Provider Performing:Montreal Steidle N Corin Tilly   Procedure: Insertion of Non-tunneled Central Venous [phone]) with US guidance ([id number])   Indication(s) Medication administration  Consent Risks of the procedure as well as the alternatives and risks of each were explained to the patient and/or caregiver.  Consent for the procedure was obtained and is signed in the bedside chart  Anesthesia Topical only with 1% lidocaine   Timeout Verified patient identification, verified procedure, site/side was marked, verified correct patient position, special equipment/implants available, medications/allergies/relevant history reviewed, required imaging and test results available.  Sterile Technique Maximal sterile technique including full sterile barrier drape, hand hygiene, sterile gown, sterile gloves, mask, hair covering, sterile ultrasound probe cover (if used).  Procedure Description Area of catheter insertion was cleaned with chlorhexidine and draped in sterile fashion.  With real-time ultrasound guidance a central venous catheter was placed into the right internal jugular vein. Nonpulsatile blood flow and easy flushing noted in all ports.  The catheter was sutured in place and sterile dressing applied.  Complications/Tolerance None; patient tolerated the procedure well. Chest X-ray is ordered to verify placement for internal jugular or subclavian cannulation.   Chest x-ray is not ordered for femoral cannulation.  EBL Minimal  Specimen(s) None

## 2021-04-03 NOTE — Consult Note (Signed)
NAME:  [name], MRN:  [id number], DOB:  04/19/1966, LOS: 1 ADMISSION DATE:  04/05/2021, CONSULTATION DATE: 04/03/21 REFERRING MD:  Danise Edge , CHIEF COMPLAINT:  Short of breath    History of Present Illness:  55 yo man with HLD, HTN, Osteomyelitis, CAD, s/p CABG presented with fever, HTN, cough, found to have COVID.   Tachycardic and hypotensive initially.  In ED 10/18 afternoon.  Started vanc/cefepime, iv decadron, procal and blood cultures p.   Seen by podiatry for foot wound at site of amputation.  IM service calling us now for hypotension, ongoing tachycardia, s/p 5L fluids.  Ongoing tachypnea.   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 rate (!) 44, height 5\' 7"  (1.702 m), weight 58.1 kg, SpO2 91 %.    FiO2 (%):  [100 %] 100 %   Intake/Output Summary (Last 24 hours) at 04/03/2021 0305 Last data filed at 04/03/2021 0251 Gross per 24 hour  Intake 2100 ml  Output 300 ml  Net 1800 ml   Filed Weights   04/06/2021 1116  Weight: 58.1 kg    Examination: General: pleasant, appears weak, shallow breaths.  HENT: NCAT Lungs: rhonchi diffusely  Cardiovascular: RRR no mgr  Abdomen: nt, nd, nbs  Extremities: Ulcer on r foot at site of amputation  Neuro: alert oriented, someone fatigued appearing GU:  Resolved Hospital Problem list     Assessment & Plan:  Hypoxemic respiratory failure:  COVID, extensive PNA, consolidation throughout L base, and RUL.  No PE Heated HF (salter) Hold further fluids  IS if able.  Cont steroids  for COVID.   Cont broad spectrum abtx.   Hypotension/sepsis:  Levophed trial (low dose) Hold further fluids.    DM: iss   HTN: hold metop  HLD on crestor   Best Practice (right click and "Reselect all SmartList Selections" daily)   Diet/type: NPO DVT prophylaxis: prophylactic heparin  GI prophylaxis: PPI Lines: N/A Foley:  N/A Code Status:  full code Last date of multidisciplinary goals of care discussion []   Labs   CBC: Recent Labs  Lab 03/23/2021 1120 03/27/2021 1136 04/04/2021 2237  WBC 5.5  --   --   NEUTROABS 4.9  --   --   HGB 11.8* 12.9* 15.3  HCT 36.9* 38.0* 45.0  MCV 82.9  --   --   PLT 221  --   --     Basic Metabolic Panel: Recent Labs  Lab 04/05/2021 1120 03/20/2021 1136 03/23/2021 2237  NA 137 138 139  K 3.6 3.6 2.9*  CL 102  --   --   CO2 18*  --   --   GLUCOSE 307*  --   --   BUN 22*  --   --   CREATININE 1.23  --   --   CALCIUM 8.5*  --   --    GFR: Estimated Creatinine Clearance: 55.8 mL/min (by C-G formula based on SCr of 1.23 mg/dL). Recent Labs  Lab 04/11/2021 1120 03/24/2021 1142 04/06/2021 1621 03/25/2021 1649 04/04/2021 2245  PROCALCITON  --   --   --  22.84  --   WBC 5.5  --   --   --   --   LATICACIDVEN  --  5.3* 2.8*  --  3.2*    Liver Function Tests: Recent Labs  Lab 04-24-2021 1120  AST 25  ALT 20  ALKPHOS 146*  BILITOT 0.8  PROT 6.6  ALBUMIN 2.8*   No results for input(s): LIPASE, AMYLASE in the last 168 hours. Recent Labs  Lab April 24, 2021 1142  AMMONIA <10    ABG    Component Value Date/Time   PHART 7.442 April 24, 2021 2237   PCO2ART 32.2 2021/04/24 2237   PO2ART 72 (L) Apr 24, 2021 2237   HCO3 21.7 04-24-21 2237   TCO2 23 Apr 24, 2021 2237   ACIDBASEDEF 1.0 2021-04-24 2237   O2SAT 94.0 Apr 24, 2021 2237     Coagulation Profile: Recent Labs  Lab 04-24-21 1120  INR 1.1    Cardiac Enzymes: No results for input(s): CKTOTAL, CKMB, CKMBINDEX, TROPONINI in the last 168 hours.  HbA1C: Hgb A1c MFr Bld  Date/Time Value  Ref Range Status  Apr 24, 2021 04:48 PM 8.3 (H) 4.8 - 5.6 % Final    Comment:    (NOTE) Pre diabetes:          5.7%-6.4%  Diabetes:              >6.4%  Glycemic control for   <7.0% adults with diabetes   12/26/2020 04:55 AM 9.9 (H) 4.8 - 5.6 % Final    Comment:    (NOTE) Pre diabetes:          5.7%-6.4%  Diabetes:              >6.4%  Glycemic control for   <7.0% adults with diabetes     CBG: Recent Labs  Lab 2021-04-24 1827 04/24/21 2153  GLUCAP 234* 144*    Review of Systems:   Feels weak and sob, otherwise negative   Past Medical History:  He,  has a past medical history of Anginal pain (HCC), Ataxia, CAD (coronary artery disease), Diabetes mellitus without complication (HCC), Dizziness, Hyperlipidemia, Hypertension, and Near syncope.   Surgical History:   Past Surgical History:  Procedure Laterality Date   ABDOMINAL AORTOGRAM W/LOWER EXTREMITY Right 01/29/2021   Procedure: ABDOMINAL AORTOGRAM W/LOWER EXTREMITY;  Surgeon: Nada Libman, MD;  [facility]: [facility];  Service: Cardiovascular;  Laterality: Right;   AMPUTATION Right 01/30/2021   Procedure: AMPUTATION RAY;  Surgeon: Vivi Barrack, DPM;  Location: [facility] OR;  Service: Podiatry;  Laterality: Right;   CARDIAC CATHETERIZATION  12/24/2020   CORONARY ARTERY BYPASS GRAFT N/A 12/27/2020   Procedure: CORONARY ARTERY BYPASS GRAFTING (CABG) X FOUR ON PUMP USING LEFT INTERNAL MAMMARY ARTERY AND RIGHT ENDOSCOPIC GREATER SAPHENOUS VEIN CONDUITS;  Surgeon: Corliss Skains, MD;  Location: [facility] OR;  Service: Open Heart Surgery;  Laterality: N/A;   LEFT HEART CATH AND CORONARY ANGIOGRAPHY N/A 12/24/2020   Procedure: LEFT HEART CATH AND CORONARY ANGIOGRAPHY;  Surgeon: Kathleene Hazel, MD;  [facility];  Service: Cardiovascular;  Laterality: N/A;   PERIPHERAL VASCULAR BALLOON ANGIOPLASTY Right 01/29/2021   Procedure: PERIPHERAL VASCULAR BALLOON ANGIOPLASTY;  Surgeon: Nada Libman, MD;   [facility];  Service: Cardiovascular;  Laterality: Right;  SFA and PT   TEE WITHOUT CARDIOVERSION N/A 12/27/2020   Procedure: TRANSESOPHAGEAL ECHOCARDIOGRAM (TEE);  Surgeon: Corliss Skains, MD;  Location: [facility];  Service: Open Heart  Surgery;  Laterality: N/A;     Social History:   reports that he quit smoking about 3 months ago. His smoking use included cigarettes. He has a 40.00 pack-year smoking history. He has never used smokeless tobacco. He reports that he does not currently use alcohol. He reports that he does not use drugs.   Family History:  His family history is not on file.   Allergies Allergies  Allergen Reactions   Atorvastatin Other (See Comments)    Myalgias    Ropinirole Other (See Comments)    Vivid dreams- MD stopped this med     Home Medications  Prior to Admission medications   Medication Sig Start Date End Date Taking? Authorizing Provider  acetaminophen (TYLENOL) 500 MG tablet Take 1,000 mg by mouth every 6 (six) hours as needed for moderate pain, mild pain or fever.   Yes [provider]  aspirin 81 MG chewable tablet Chew 1 tablet (81 mg total) by mouth daily. 09/20/20  Yes Nanavati, Ankit, MD  busPIRone (BUSPAR) 10 MG tablet Take 10 mg by mouth 2 (two) times daily. 07/06/20  Yes [provider]  carvedilol (COREG) 3.125 MG tablet Take 3.125 mg by mouth 2 (two) times daily. 02/16/21  Yes [provider]  clopidogrel (PLAVIX) 75 MG tablet Take 1 tablet (75 mg total) by mouth daily with breakfast. 03/19/21 03/14/22 Yes Alver Sorrow, NP  collagenase (SANTYL) ointment Apply 1 application topically daily. Patient taking differently: Apply 1 application topically See admin instructions. Use with wound care regimen as directed (right foot) 03/14/21  Yes Vivi Barrack, DPM  CYMBALTA 60 MG capsule Take 60 mg by mouth in the morning. 07/06/20  Yes [provider]  doxycycline (VIBRA-TABS) 100 MG tablet Take 1  tablet (100 mg total) by mouth 2 (two) times daily. 03/28/21  Yes Vivi Barrack, DPM  gabapentin (NEURONTIN) 300 MG capsule Take 300 mg by mouth 3 (three) times daily with meals.   Yes [provider]  glipiZIDE (GLUCOTROL) 10 MG tablet Take 10 mg by mouth 2 (two) times daily. 07/06/20  Yes [provider]  HYDROcodone-acetaminophen (NORCO/VICODIN) 5-325 MG tablet Take 1 tablet by mouth every 6 (six) hours as needed for moderate pain. 03/21/21  Yes Vivi Barrack, DPM  Insulin Glargine (BASAGLAR KWIKPEN) 100 UNIT/ML Inject 15 Units into the skin daily as needed (for elevated BGL). 07/06/20  Yes [provider]  metFORMIN (GLUCOPHAGE) 1000 MG tablet Take 500 mg by mouth 2 (two) times daily. 07/06/20  Yes [provider]  metoprolol tartrate (LOPRESSOR) 25 MG tablet Take 0.5 tablets (12.5 mg total) by mouth 2 (two) times daily. 01/17/21  Yes Alver Sorrow, NP  midodrine (PROAMATINE) 10 MG tablet Take 1 tablet (10 mg total) by mouth 3 (three) times daily with meals. 01/17/21  Yes Alver Sorrow, NP  omeprazole (PRILOSEC) 40 MG capsule Take 40 mg by mouth daily before breakfast. 10/10/20  Yes [provider]  rosuvastatin (CRESTOR) 20 MG tablet Take 1 tablet (20 mg total) by mouth daily. 12/10/20  Yes Meriam Sprague, MD  topiramate (TOPAMAX) 50 MG tablet Take 1 tablet (50 mg total) by mouth 2 (two) times daily. 12/19/20  Yes Penumalli, Glenford Bayley, MD  Vitamin D, Ergocalciferol, (DRISDOL) 1.25 MG (50000 UNIT) CAPS capsule Take 50,000 Units by mouth every Friday. 12/11/20  Yes [provider]  Accu-Chek Softclix Lancets lancets SMARTSIG:Topical 12/10/20   [provider]  diphenhydrAMINE (BENADRYL) 25 MG tablet Take  25 mg by mouth in the morning. Patient not taking: Reported on Apr 23, 2021    [provider]  ferrous sulfate 325 (65 FE) MG tablet Take 325 mg by mouth daily with breakfast. Patient not taking: Reported on 04/23/21     [provider]     Critical care time: 60 min

## 2021-04-03 NOTE — Progress Notes (Signed)
Pharmacy Antibiotic Note  [name] is a 55 y.o. male admitted on 05-02-21 with sepsis.  Pharmacy has been consulted for vancomycin and cefepime dosing. SCr improving. WBC 2.5. LA trending down. 12L HFNC.   Plan:  Adjust Vancomycin to 1750 mg IV q24h (eAUC 477, Cr 0.8 mg/dL, Vd 9.70) Adjust Cefepime to 2g IV q8h -Monitor renal function, clinical status, and antibiotic plan -Order vanc levels as necessary  Height: 5\' 7"  (170.2 cm) Weight: 59.7 kg (131 lb 9.8 oz) IBW/kg (Calculated) : 66.1  Temp (24hrs), Avg:100.5 F (38.1 C), Min:97.5 F (36.4 C), Max:102.9 F (39.4 C)  Recent Labs  Lab May 02, 2021 1120 2021-05-02 1142 2021/05/02 1621 May 02, 2021 2245 04/03/21 0215 04/03/21 0320  WBC 5.5  --   --   --   --  2.5*  CREATININE 1.23  --   --   --   --  0.79  LATICACIDVEN  --  5.3* 2.8* 3.2* 2.7*  --     Estimated Creatinine Clearance: 88.1 mL/min (by C-G formula based on SCr of 0.79 mg/dL).    Allergies  Allergen Reactions   Atorvastatin Other (See Comments)    Myalgias    Ropinirole Other (See Comments)    Vivid dreams- MD stopped this med    Antimicrobials this admission: Vanc 10/18 >>  Cefepime 10/18 >>   Dose adjustments this admission: N/A  Microbiology results: 10/18 BCx:  10/18 MRSA PCR positive 10/18 COVID positive  Thank you for allowing pharmacy to be a part of this patient's care.  11/18, PharmD, BCPS, BCCCP Clinical Pharmacist Please refer to [facility] for [facility] numbers 04/03/2021, 9:08 AM

## 2021-04-04 ENCOUNTER — Encounter: Payer: Medicaid Other | Admitting: Podiatry

## 2021-04-04 DIAGNOSIS — J80 Acute respiratory distress syndrome: Secondary | ICD-10-CM

## 2021-04-04 DIAGNOSIS — U071 COVID-19: Secondary | ICD-10-CM | POA: Diagnosis not present

## 2021-04-04 LAB — BLOOD GAS, VENOUS
Acid-base deficit: 1.8 mmol/L (ref 0.0–2.0)
Bicarbonate: 23 mmol/L (ref 20.0–28.0)
FIO2: 96
O2 Saturation: 36 %
Patient temperature: 38.7
pCO2, Ven: 47.2 mmHg (ref 44.0–60.0)
pH, Ven: 7.32 (ref 7.250–7.430)
pO2, Ven: <31 mmHg — CL (ref 32.0–45.0)

## 2021-04-04 LAB — CBC WITH DIFFERENTIAL/PLATELET
Abs Immature Granulocytes: 0.07 10*3/uL (ref 0.00–0.07)
Basophils Absolute: 0 10*3/uL (ref 0.0–0.1)
Basophils Relative: 1 %
Eosinophils Absolute: 0 10*3/uL (ref 0.0–0.5)
Eosinophils Relative: 0 %
HCT: 31 % — ABNORMAL LOW (ref 39.0–52.0)
Hemoglobin: 10.1 g/dL — ABNORMAL LOW (ref 13.0–17.0)
Immature Granulocytes: 2 %
Lymphocytes Relative: 9 %
Lymphs Abs: 0.4 10*3/uL — ABNORMAL LOW (ref 0.7–4.0)
MCH: 26.4 pg (ref 26.0–34.0)
MCHC: 32.6 g/dL (ref 30.0–36.0)
MCV: 81.2 fL (ref 80.0–100.0)
Monocytes Absolute: 0.1 10*3/uL (ref 0.1–1.0)
Monocytes Relative: 3 %
Neutro Abs: 4 10*3/uL (ref 1.7–7.7)
Neutrophils Relative %: 85 %
Platelets: 181 10*3/uL (ref 150–400)
RBC: 3.82 MIL/uL — ABNORMAL LOW (ref 4.22–5.81)
RDW: 15.6 % — ABNORMAL HIGH (ref 11.5–15.5)
WBC: 4.7 10*3/uL (ref 4.0–10.5)
nRBC: 0 % (ref 0.0–0.2)

## 2021-04-04 LAB — POCT I-STAT 7, (LYTES, BLD GAS, ICA,H+H)
Acid-base deficit: 7 mmol/L — ABNORMAL HIGH (ref 0.0–2.0)
Bicarbonate: 20.6 mmol/L (ref 20.0–28.0)
Calcium, Ion: 1.18 mmol/L (ref 1.15–1.40)
HCT: 29 % — ABNORMAL LOW (ref 39.0–52.0)
Hemoglobin: 9.9 g/dL — ABNORMAL LOW (ref 13.0–17.0)
O2 Saturation: 99 %
Patient temperature: 101
Potassium: 4.5 mmol/L (ref 3.5–5.1)
Sodium: 138 mmol/L (ref 135–145)
TCO2: 22 mmol/L (ref 22–32)
pCO2 arterial: 51.4 mmHg — ABNORMAL HIGH (ref 32.0–48.0)
pH, Arterial: 7.219 — ABNORMAL LOW (ref 7.350–7.450)
pO2, Arterial: 181 mmHg — ABNORMAL HIGH (ref 83.0–108.0)

## 2021-04-04 LAB — TRIGLYCERIDES: Triglycerides: 228 mg/dL — ABNORMAL HIGH (ref <150)

## 2021-04-04 LAB — GLUCOSE, CAPILLARY
Glucose-Capillary: 106 mg/dL — ABNORMAL HIGH (ref 70–99)
Glucose-Capillary: 170 mg/dL — ABNORMAL HIGH (ref 70–99)
Glucose-Capillary: 51 mg/dL — ABNORMAL LOW (ref 70–99)
Glucose-Capillary: 62 mg/dL — ABNORMAL LOW (ref 70–99)
Glucose-Capillary: 71 mg/dL (ref 70–99)
Glucose-Capillary: 76 mg/dL (ref 70–99)
Glucose-Capillary: 88 mg/dL (ref 70–99)

## 2021-04-04 LAB — BASIC METABOLIC PANEL
Anion gap: 6 (ref 5–15)
BUN: 22 mg/dL — ABNORMAL HIGH (ref 6–20)
CO2: 21 mmol/L — ABNORMAL LOW (ref 22–32)
Calcium: 7.5 mg/dL — ABNORMAL LOW (ref 8.9–10.3)
Chloride: 109 mmol/L (ref 98–111)
Creatinine, Ser: 0.85 mg/dL (ref 0.61–1.24)
GFR, Estimated: >60 mL/min (ref >60)
Glucose, Bld: 88 mg/dL (ref 70–99)
Potassium: 3.6 mmol/L (ref 3.5–5.1)
Sodium: 136 mmol/L (ref 135–145)

## 2021-04-04 LAB — FERRITIN: Ferritin: 618 ng/mL — ABNORMAL HIGH (ref 24–336)

## 2021-04-04 LAB — D-DIMER, QUANTITATIVE: D-Dimer, Quant: 5.79 ug/mL-FEU — ABNORMAL HIGH (ref 0.00–0.50)

## 2021-04-04 LAB — MAGNESIUM: Magnesium: 2.3 mg/dL (ref 1.7–2.4)

## 2021-04-04 LAB — LEGIONELLA PNEUMOPHILA SEROGP 1 UR AG: L. pneumophila Serogp 1 Ur Ag: NEGATIVE

## 2021-04-04 LAB — C-REACTIVE PROTEIN: CRP: 42.1 mg/dL — ABNORMAL HIGH (ref <1.0)

## 2021-04-04 MED ORDER — VASOPRESSIN 20 UNITS/100 ML INFUSION FOR SHOCK
0.0000 [IU]/min | INTRAVENOUS | Status: DC
  Administered 2021-04-04: 0.04 [IU]/min via INTRAVENOUS
  Administered 2021-04-04: 0.03 [IU]/min via INTRAVENOUS
  Administered 2021-04-05 – 2021-04-07 (×8): 0.04 [IU]/min via INTRAVENOUS
  Filled 2021-04-04 (×11): qty 100

## 2021-04-04 MED ORDER — PHENYLEPHRINE CONCENTRATED 100MG/250ML (0.4 MG/ML) INFUSION SIMPLE
25.0000 ug/min | INTRAVENOUS | Status: DC
  Administered 2021-04-04: 200 ug/min via INTRAVENOUS
  Filled 2021-04-04 (×2): qty 250

## 2021-04-04 MED ORDER — DEXTROSE 10 % IV SOLN: INTRAVENOUS | Status: DC

## 2021-04-04 MED ORDER — INSULIN GLARGINE-YFGN 100 UNIT/ML ~~LOC~~ SOLN: 8.0000 [IU] | Freq: Every day | SUBCUTANEOUS | Status: DC

## 2021-04-04 MED ORDER — VITAL 1.5 CAL PO LIQD
1000.0000 mL | ORAL | Status: DC
  Administered 2021-04-04: 1000 mL
  Filled 2021-04-04: qty 1000

## 2021-04-04 MED ORDER — ALBUMIN HUMAN 25 % IV SOLN
25.0000 g | Freq: Four times a day (QID) | INTRAVENOUS | Status: AC
Start: 2021-04-05 — End: 2021-04-05
  Administered 2021-04-05 (×4): 25 g via INTRAVENOUS
  Filled 2021-04-04 (×4): qty 100

## 2021-04-04 MED ORDER — LACTATED RINGERS IV BOLUS
500.0000 mL | Freq: Once | INTRAVENOUS | Status: AC
  Administered 2021-04-04: 500 mL via INTRAVENOUS

## 2021-04-04 MED ORDER — PROSOURCE TF PO LIQD
45.0000 mL | Freq: Every day | ORAL | Status: DC
  Administered 2021-04-04 – 2021-04-07 (×4): 45 mL
  Filled 2021-04-04 (×3): qty 45

## 2021-04-04 MED ORDER — POTASSIUM CHLORIDE 10 MEQ/50ML IV SOLN
10.0000 meq | INTRAVENOUS | Status: AC
  Administered 2021-04-04 (×4): 10 meq via INTRAVENOUS
  Filled 2021-04-04 (×3): qty 50

## 2021-04-04 MED ORDER — SODIUM CHLORIDE 0.9 % IV SOLN: INTRAVENOUS | Status: DC | PRN

## 2021-04-04 MED ORDER — NOREPINEPHRINE 16 MG/250ML-% IV SOLN
0.0000 ug/min | INTRAVENOUS | Status: DC
  Administered 2021-04-04: 2 ug/min via INTRAVENOUS
  Administered 2021-04-05 – 2021-04-07 (×2): 8 ug/min via INTRAVENOUS
  Filled 2021-04-04 (×3): qty 250

## 2021-04-04 MED ORDER — ALBUMIN HUMAN 5 % IV SOLN: 25.0000 g | Freq: Once | INTRAVENOUS | Status: DC

## 2021-04-04 MED ORDER — VITAL 1.5 CAL PO LIQD: 1000.0000 mL | ORAL | Status: DC

## 2021-04-04 MED ORDER — MUPIROCIN 2 % EX OINT
TOPICAL_OINTMENT | Freq: Two times a day (BID) | CUTANEOUS | Status: DC
  Administered 2021-04-06 – 2021-04-07 (×2): 1 via NASAL
  Filled 2021-04-04: qty 22

## 2021-04-04 NOTE — Progress Notes (Signed)
Subjective:  Patient ID: [name], male    DOB: Nov 18, 1965,  MRN: [id number]   Patient seen at bedside. Intubated and unresponsive.   Interval history  HPI: 55 y.o. male with a pertinent PMH of HLD, HTN, osteomyelitis, CAD SP CABG x4, who presents to [facility] with hypertension, cough, chills, and fever.  Patient is unable to provide any history but upon speaking to his wife she states that last night he was not feeling well and she was getting get some cold medication.  This morning when she woke up he was half off the bed and unresponsive and that is when he was brought to the emergency department.  While in emergency department he is hypotensive and tachycardia with heart rates in the 140s.  A fever of 102.8.  Chest x-ray shows multifocal pneumonia in his left lung and right upper and middle lobes.  Lactic acid was 5.3.  He is started on IV Decadron and broad-spectrum antibiotics for sepsis.   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  He had revascularization performed by vascular surgery.  The wound went on to dehiscence and we have been doing local wound care.  I last saw him last week and we discussed return to the operating room for debridement versus further amputation.  He did not want proceed with surgery at that time and want to get another week or so before proceeding.  He has been using Santyl on the wound.  Past Medical History:  Diagnosis Date   Anginal pain (HCC)    Ataxia    CAD (coronary artery disease)    Diabetes mellitus without complication (HCC)    Dizziness    Hyperlipidemia    Hypertension    Near syncope      Past Surgical History:  Procedure Laterality Date   ABDOMINAL AORTOGRAM W/LOWER EXTREMITY Right 01/29/2021   Procedure: ABDOMINAL AORTOGRAM W/LOWER EXTREMITY;  Surgeon: Nada Libman, MD;  [facility];  Service: Cardiovascular;  Laterality: Right;   AMPUTATION Right 01/30/2021   Procedure:  AMPUTATION RAY;  Surgeon: Vivi Barrack, DPM;  Location: [facility] OR;  Service: Podiatry;  Laterality: Right;   CARDIAC CATHETERIZATION  12/24/2020   CORONARY ARTERY BYPASS GRAFT N/A 12/27/2020   Procedure: CORONARY ARTERY BYPASS GRAFTING (CABG) X FOUR ON PUMP USING LEFT INTERNAL MAMMARY ARTERY AND RIGHT ENDOSCOPIC GREATER SAPHENOUS VEIN CONDUITS;  Surgeon: Corliss Skains, MD;  Location: [facility] OR;  Service: Open Heart Surgery;  Laterality: N/A;   LEFT HEART CATH AND CORONARY ANGIOGRAPHY N/A 12/24/2020   Procedure: LEFT HEART CATH AND CORONARY ANGIOGRAPHY;  Surgeon: Kathleene Hazel, MD;  [facility];  Service: Cardiovascular;  Laterality: N/A;   PERIPHERAL VASCULAR BALLOON ANGIOPLASTY Right 01/29/2021   Procedure: PERIPHERAL VASCULAR BALLOON ANGIOPLASTY;  Surgeon: Nada Libman, MD;  [facility];  Service: Cardiovascular;  Laterality: Right;  SFA and PT   TEE WITHOUT CARDIOVERSION N/A 12/27/2020   Procedure: TRANSESOPHAGEAL ECHOCARDIOGRAM (TEE);  Surgeon: Corliss Skains, MD;  [facility];  Service: Open Heart Surgery;  Laterality: N/A;    CBC Latest Ref Rng & Units 04/04/2021 04/03/2021 04/03/2021  WBC 4.0 - 10.5 K/uL 4.7 - -  Hemoglobin 13.0 - 17.0 g/dL 10.1(L) 10.2(L) 9.2(L)  Hematocrit 39.0 - 52.0 % 31.0(L) 30.0(L) 27.0(L)  Platelets 150 - 400 K/uL 181 - -    BMP Latest Ref Rng & Units 04/04/2021 04/03/2021 04/03/2021  Glucose  70 - 99 mg/dL 88 976(B) -  BUN 6 - 20 mg/dL 34(L) 19 -  Creatinine 0.61 - 1.24 mg/dL 9.37 9.02 -  BUN/Creat Ratio 9 - 20 - - -  Sodium 135 - 145 mmol/L 136 135 135  Potassium 3.5 - 5.1 mmol/L 3.6 3.7 3.2(L)  Chloride 98 - 111 mmol/L 109 102 -  CO2 22 - 32 mmol/L 21(L) 21(L) -  Calcium 8.9 - 10.3 mg/dL 7.5(L) 8.0(L) -     Objective:   Vitals:   04/04/21 0545 04/04/21 0600  BP: 100/62 102/64  Pulse: 99 (!) 101  Resp: 17 17  Temp:    SpO2: 100% 100%   General AA&O x3. Normal mood and affect.   Vascular Dorsalis pedis and posterior tibial pulses 2/4 bilat. Brisk capillary refill to all digits. Pedal hair present.  Neurologic Epicritic sensation grossly intact.  Dermatologic Full-thickness ulceration noted on the foot only at the dehiscence.  There does appear to be some increased granulation tissue present compared to prior with the distal portion appears to be fibrotic and necrotic.  Rim of erythema without any ascending cellulitis.  Unable to identify any areas of fluctuation or crepitation.  No drainage noted.   Orthopedic: MMT 5/5 in dorsiflexion, plantarflexion, inversion, and eversion. Normal joint ROM without pain or crepitus.       Assessment & Plan:  Patient was evaluated and treated and all questions answered.  Recommend continuing with broad-spectrum antibiotics. MRI is pending will continue to follow. X-rays reviewed and negative for osteomyelitis.  Still believe the wound would benefit from debridement but do not believe this is cause of sepsis at this time. Will await for patient to stabilize to discuss surgical option possibly while in house. Will continue to follow.   Louann Sjogren, MD  Accessible via secure chat for questions or concerns.

## 2021-04-04 NOTE — Progress Notes (Signed)
NAME:  [name], MRN:  [id number], DOB:  09/11/1965, LOS: 2 ADMISSION DATE:  04/06/2021, CONSULTATION DATE:  04/03/21 REFERRING MD:  Danise Edge, CHIEF COMPLAINT:  SHOB   Brief History   [name] is a 55 y.o. with a pertinent PMH of HLD, HTN, osteomyelitis, CAD SP CABG x4, who presented with fevers, chills, cough,  and SHOB. On admission, patient developed acute hypoxic respiratory failure requiring intubation and admitted for COVID pneumonia.   Past Medical History   has a past medical history of Anginal pain (HCC), Ataxia, CAD (coronary artery disease), Diabetes mellitus without complication (HCC), Dizziness, Hyperlipidemia, Hypertension, and Near syncope. Significant Hospital Events   10/18 admitted 10/19 hypoxic respiratory failure s/p intubation   Consults:    Procedures:  10/19 intubated 10/19 CVC  Significant Diagnostic Tests:  CTA  Result Date: 04/06/2021 No evidence of pulmonary embolism. Multifocal pneumonia in this patient with known COVID, left lower lobe predominant. Small left pleural effusion. Aortic Atherosclerosis (ICD10-I70.0).  DG Abd 1 View Result Date: 04/03/2021 IMPRESSION: NG tip in the region of the gastric antrum or pylorus.   DG CHEST PORT 1 VIEW Result Date: 04/03/2021 IMPRESSION: Tubes and lines as described above.  No pneumothorax is noted. Remainder of the exam is stable from the prior study.   DG CHEST PORT 1 VIEW Result Date: 04/03/2021 Multifocal pneumonia with similar appearance to the chest CT acquired at 2:31 a.m.   Micro Data:  10/18 COVID positive  10/18 MRSA PCR positive  10/18 Bcx no growth at 24 hours Ucx; negative ' Pct 22 on admit  Antimicrobials:  Cefepime 10/18> Vanc 10/18>   Interim History/Subjective:  Labile Bps yesterday led to MRI being delayed. Remains on vent, sedated on pressors.  Objective:  Blood pressure 132/79, pulse (!) 115, temperature 99.9 F (37.7 C), resp. rate 16, height 5\' 7"  (1.702 m),  weight 64.3 kg, SpO2 92 %. CVP:  [4 mmHg-6 mmHg] 5 mmHg  Vent Mode: PRVC FiO2 (%):  [60 %-100 %] 60 % Set Rate:  [20 bmp-22 bmp] 22 bmp Vt Set:  [390 mL-400 mL] 390 mL PEEP:  [10 cmH20] 10 cmH20 Plateau Pressure:  [18 cmH20-24 cmH20] 20 cmH20   Intake/Output Summary (Last 24 hours) at 04/04/2021 2313 Last data filed at 04/04/2021 2100 Gross per 24 hour  Intake 6185.68 ml  Output 1470 ml  Net 4715.68 ml    Filed Weights   04/03/2021 1116 04/03/21 0550 04/04/21 0408  Weight: 58.1 kg 59.7 kg 64.3 kg    Examination: Sedated on vent Lungs with scattered rhonci, passive on vent Pupils pinpoint, reactive Heart sounds regular, sinus on monitor RLE wrapped without strikethrough RASS -5  On levo/vaso  Lab review worsening pancytopenia Imaging review stable bilateral airspace disease   Resolved Hospital Problem list   none  Assessment & Plan:  COVID ARDS +/- CAP- intubated 10/19 RLE wound with 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 Shock- question infection (Pct 23 on admit) or sedative effects, intravascularly dry by Agarwala POCUS on 10/20 Intermittent hypoglycemia Worsening pancytopenia- keep an eye on for now; no meds jumping out for this  - ARDSNet protocol lung protective TV and generous PEEP; limit DP to < 15cmH2O as able - Dexamethasone as ordered - Sedation titrated to vent synchrony, O2 gradient still too high for SBT - Wean levophed/vasopressin to MAP 65; albumin as ordered - MRI of RLE at some point - Continue broad spectrum abx pending result of MRI and further  culture data - f/u AM BMP - D20 gtt for now - Guarded prognosis  Best Practice:  Diet: tubefeeds Pain/Anxiety/Delirium protocol (if indicated): yes,  VAP protocol (if indicated): yes DVT prophylaxis: prophylactic heparin  GI prophylaxis: PPI Glucose control: SSI Yes  Lines: Central line Foley:  Yes, and it is still needed Mobility: bed rest, Code Status: full  code Family Communication: pending Disposition: ICU   Patient critically ill due to shock, respiratory failure Interventions to address this today vent and pressor titration Risk of deterioration without these interventions is high  I personally spent 33 minutes providing critical care not including any separately billable procedures  Myrla Halsted MD [facility]  Prefer epic messenger for cross cover needs If after hours, please call E-link

## 2021-04-04 NOTE — Progress Notes (Signed)
Hypoglycemic Event  CBG: 51  Treatment: 8 oz juice/soda  Symptoms: None  Follow-up CBG: Time: 1147 CBG Result:72  Possible Reasons for Event: No continuous tube feeds.    Comments/MD notified: Dr. Marchia Bond, awaiting tube feeds to be started.     [name]

## 2021-04-04 NOTE — Progress Notes (Signed)
[facility] ADULT ICU REPLACEMENT PROTOCOL   The patient does apply for the [facility] Adult ICU Electrolyte Replacment Protocol based on the criteria listed below:   1.Exclusion criteria: TCTS patients, ECMO patients, and Dialysis patients 2. Is GFR >/= 30 ml/min? Yes.    Patient's GFR today is >60 3. Is SCr </= 2? No. Patient's SCr is 0.85 mg/dL 4. Did SCr increase >/= 0.5 in 24 hours? No. 5.Pt's weight >40kg  Yes.   6. Abnormal electrolyte(s): K+ 3.6  7. Electrolytes replaced per protocol 8.  Call MD STAT for K+ </= 2.5, Phos </= 1, or Mag </= 1 Physician:  n/a  Melvern Banker 04/04/2021 6:07 AM

## 2021-04-04 NOTE — Progress Notes (Signed)
eLink Physician-Brief Progress Note Patient Name: [name] DOB: 1966-02-26 MRN: [id number]   Date of Service  04/04/2021  HPI/Events of Note  - HR now 100-110 - off LEVO - NEO dose high  eICU Interventions  - ordered VASO - ordered foley - ordered LR 500cc bolus     Intervention Category Major Interventions: Hypotension - evaluation and management  Jacinta Shoe 04/04/2021, 1:36 AM

## 2021-04-04 NOTE — Progress Notes (Signed)
RT unable to obtain ABG at this time. MD made aware. Will re-attempt at a later time.

## 2021-04-04 NOTE — Progress Notes (Addendum)
Hypoglycemic Event  CBG: 62  Treatment: 8 oz juice/soda  Symptoms: None  Follow-up CBG: Time:. 1606 CBG Result: 76  Possible Reasons for Event: Other: Tube feed started but on trickle feeds at low rate.   Comments/MD notified: MD notified order for D10 to be started per Dr. Marchia Bond.     Mallie Darting

## 2021-04-04 NOTE — Procedures (Signed)
Arterial Catheter Insertion Procedure Note  [name]  [id number]  01/12/1966  Date:04/04/21  Time:3:07 PM    Provider Performing: Mindi Curling    Procedure: Insertion of Arterial Line ([id number]) without US guidance  Indication(s) Blood pressure monitoring and/or need for frequent ABGs  Consent Unable to obtain consent due to emergent nature of procedure.  Anesthesia None   Time Out Verified patient identification, verified procedure, site/side was marked, verified correct patient position, special equipment/implants available, medications/allergies/relevant history reviewed, required imaging and test results available.   Sterile Technique Maximal sterile technique including full sterile barrier drape, hand hygiene, sterile gown, sterile gloves, mask, hair covering, sterile ultrasound probe cover (if used).   Procedure Description Area of catheter insertion was cleaned with chlorhexidine and draped in sterile fashion. Without real-time ultrasound guidance an arterial catheter was placed into the left radial artery.  Appropriate arterial tracings confirmed on monitor.     Complications/Tolerance None; patient tolerated the procedure well.   EBL Minimal   Specimen(s) None

## 2021-04-04 NOTE — Progress Notes (Addendum)
NAME:  [name], MRN:  [id number], DOB:  06/05/1966, LOS: 2 ADMISSION DATE:  Apr 07, 2021, CONSULTATION DATE:  04/03/21 REFERRING MD:  Danise Edge, CHIEF COMPLAINT:  SHOB   Brief History   [name] is a 55 y.o. with a pertinent PMH of HLD, HTN, osteomyelitis, CAD SP CABG x4, who presented with fevers, chills, cough,  and SHOB. On admission, patient developed acute hypoxic respiratory failure requiring intubation and admitted for COVID pneumonia.   Past Medical History   has a past medical history of Anginal pain (HCC), Ataxia, CAD (coronary artery disease), Diabetes mellitus without complication (HCC), Dizziness, Hyperlipidemia, Hypertension, and Near syncope. Significant Hospital Events   10/18 admitted 10/19 hypoxic respiratory failure s/p intubation   Consults:    Procedures:  10/19 intubated 10/19 CVC  Significant Diagnostic Tests:  CTA  Result Date: 07-Apr-2021 No evidence of pulmonary embolism. Multifocal pneumonia in this patient with known COVID, left lower lobe predominant. Small left pleural effusion. Aortic Atherosclerosis (ICD10-I70.0).  DG Abd 1 View Result Date: 04/03/2021 IMPRESSION: NG tip in the region of the gastric antrum or pylorus.   DG CHEST PORT 1 VIEW Result Date: 04/03/2021 IMPRESSION: Tubes and lines as described above.  No pneumothorax is noted. Remainder of the exam is stable from the prior study.   DG CHEST PORT 1 VIEW Result Date: 04/03/2021 Multifocal pneumonia with similar appearance to the chest CT acquired at 2:31 a.m.   Micro Data:  10/18 COVID positive  10/18 MRSA PCR positive  10/18 Bcx no growth at 24 hours Ucx; negative   Antimicrobials:  Cefepime 10/18> Vanc 10/18>   Interim History/Subjective:  Overnight: switched to phenylephrine due to tachycardia.  Patient resting in bed intubated and sedated.   Objective:  Blood pressure 102/64, pulse (!) 101, temperature 100 F (37.8 C), temperature source Oral, resp. rate  17, height 5\' 7"  (1.702 m), weight 64.3 kg, SpO2 100 %.    Vent Mode: PRVC FiO2 (%):  [100 %] 100 % Set Rate:  [20 bmp] 20 bmp Vt Set:  [400 mL] 400 mL PEEP:  [10 cmH20] 10 cmH20 Plateau Pressure:  [18 cmH20-26 cmH20] 18 cmH20   Intake/Output Summary (Last 24 hours) at 04/04/2021 0706 Last data filed at 04/04/2021 04/06/2021 Gross per 24 hour  Intake 4010.73 ml  Output 1260 ml  Net 2750.73 ml   Filed Weights   04/11/2021 1116 04/03/21 0550 04/04/21 0408  Weight: 58.1 kg 59.7 kg 64.3 kg    Examination: General: sedated and intubated HEENT: ETT in place  Lungs: scatter wheezing  Cardiovascular: tachycardic but regular rhythm  Abdomen: soft normal bowel sounds Extremities: warm and dry not edema  Neuro: sedated GU: n/a  Patient Lines/Drains/Airways Status     Active Line/Drains/Airways     Name Placement date Placement time Site Days   Peripheral IV 04/03/2021 20 G Left Forearm 03/31/2021  1055  Forearm  2   CVC Triple Lumen 04/03/21 Right Internal jugular 04/03/21  1730  -- 1   Urethral Catheter Brooke RN Temperature probe 16 Fr. 04/04/21  0154  Temperature probe  less than 1   Airway 7.5 mm 04/03/21  1532  -- 1   Incision (Closed) 01/30/21 Foot Right 01/30/21  1355  -- 64   Wound / Incision (Open or Dehisced) 01/30/21 Skin tear Nose Medial 01/30/21  1859  Nose  64   Wound / Incision (Open or Dehisced) 04/03/21 Foot Anterior;Right;Lateral 04/03/21  0800  Foot  1  Resolved Hospital Problem list   none  Assessment & Plan:   Hypoxemic respiratory failure, ARDS:  COVID, extensive PNA, consolidation throughout L base, and RUL.  No PE Patient intubated yesterday. Fevers overnight and remains tachycardic. Bcx pending and receiving steroids. Still high requirements.  - ABG pending , consider prone position  - Intubated will aim for low TV ventilation. - VATs protocol - SBT when possible ' - Pulm hygiene  - Continue steroids - MRSA PCR positive, on broad spectrum  AB, pending cultures.    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 Pessures are soft.  Will switch back to levo and a vaso. - Continue pressors for MAPs >65   DM:  - SSI   HTN:  - hold metop   Best Practice:  Diet: tubefeeds Pain/Anxiety/Delirium protocol (if indicated): yes,  VAP protocol (if indicated): yes DVT prophylaxis: prophylactic heparin  GI prophylaxis: PPI Glucose control: SSI Yes prn Lines: Central line Foley:  Yes, and it is still needed Mobility: bed rest, Code Status: full code Family Communication: yesterday Disposition: ICU  Labs   CBC: Recent Labs  Lab 03/17/2021 1120 03/27/2021 1136 04/03/21 0320 04/03/21 0500 04/03/21 0952 04/03/21 1746 04/04/21 0415  WBC 5.5  --  2.5*  --   --   --  4.7  NEUTROABS 4.9  --  1.6*  --   --   --  4.0  HGB 11.8*   < > 10.9* 10.5* 9.2* 10.2* 10.1*  HCT 36.9*   < > 33.5* 31.0* 27.0* 30.0* 31.0*  MCV 82.9  --  81.5  --   --   --  81.2  PLT 221  --  183  --   --   --  181   < > = values in this interval not displayed.    Basic Metabolic Panel: Recent Labs  Lab 04/06/2021 1120 03/21/2021 1136 04/03/21 0320 04/03/21 0500 04/03/21 0740 04/03/21 0952 04/03/21 1746 04/03/21 1932 04/04/21 0415  NA 137   < > 134* 137  --  131* 135 135 136  K 3.6   < > 3.0* 3.0*  --  2.9* 3.2* 3.7 3.6  CL 102  --  105  --   --   --   --  102 109  CO2 18*  --  20*  --   --   --   --  21* 21*  GLUCOSE 307*  --  121*  --   --   --   --  147* 88  BUN 22*  --  20  --   --   --   --  19 22*  CREATININE 1.23  --  0.79  --   --   --   --  0.82 0.85  CALCIUM 8.5*  --  8.0*  --   --   --   --  8.0* 7.5*  MG  --   --   --   --  0.8*  --   --   --  2.3   < > = values in this interval not displayed.   GFR: Estimated Creatinine Clearance: 89.3  mL/min (by C-G formula based on SCr of 0.85 mg/dL). Recent Labs  Lab 04/05/2021 1120 04/04/2021 1142 03/26/2021 1621 04/08/2021 1649 03/31/2021 2245 04/03/21 0215 04/03/21 0320  04/04/21 0415  PROCALCITON  --   --   --  22.84  --   --   --   --   WBC 5.5  --   --   --   --   --  2.5* 4.7  LATICACIDVEN  --  5.3* 2.8*  --  3.2* 2.7*  --   --     Liver Function Tests: Recent Labs  Lab 04/04/2021 1120 04/03/21 0320  AST 25 30  ALT 20 17  ALKPHOS 146* 67  BILITOT 0.8 0.7  PROT 6.6 5.0*  ALBUMIN 2.8* 1.9*   No results for input(s): LIPASE, AMYLASE in the last 168 hours. Recent Labs  Lab April 04, 2021 1142  AMMONIA <10    ABG    Component Value Date/Time   PHART 7.285 (L) 04/03/2021 1746   PCO2ART 42.5 04/03/2021 1746   PO2ART 66 (L) 04/03/2021 1746   HCO3 20.3 04/03/2021 1746   TCO2 22 04/03/2021 1746   ACIDBASEDEF 6.0 (H) 04/03/2021 1746   O2SAT 91.0 04/03/2021 1746     Coagulation Profile: Recent Labs  Lab April 04, 2021 1120  INR 1.1    Cardiac Enzymes: No results for input(s): CKTOTAL, CKMB, CKMBINDEX, TROPONINI in the last 168 hours.  HbA1C: Hgb A1c MFr Bld  Date/Time Value Ref Range Status  2021/04/04 04:48 PM 8.3 (H) 4.8 - 5.6 % Final    Comment:    (NOTE) Pre diabetes:          5.7%-6.4%  Diabetes:              >6.4%  Glycemic control for   <7.0% adults with diabetes   12/26/2020 04:55 AM 9.9 (H) 4.8 - 5.6 % Final    Comment:    (NOTE) Pre diabetes:          5.7%-6.4%  Diabetes:              >6.4%  Glycemic control for   <7.0% adults with diabetes     CBG: Recent Labs  Lab 04/03/21 0819 04/03/21 1248 04/03/21 1700 04/03/21 2351 04/04/21 0329  GLUCAP 128* 139* 136* 136* 88    Review of Systems:   See above  Past Medical History  He,  has a past medical history of Anginal pain (HCC), Ataxia, CAD (coronary artery disease), Diabetes mellitus without complication (HCC), Dizziness, Hyperlipidemia, Hypertension, and Near syncope.   Surgical  History    Past Surgical History:  Procedure Laterality Date   ABDOMINAL AORTOGRAM W/LOWER EXTREMITY Right 01/29/2021   Procedure: ABDOMINAL AORTOGRAM W/LOWER EXTREMITY;  Surgeon: Nada Libman, MD;  Location: [facility]AS[facility];  Service: Cardiovascular;  Laterality: Right;   AMPUTATION Right 01/30/2021   Procedure: AMPUTATION RAY;  Surgeon: Vivi Barrack, DPM;  Location: [facility] OR;  Service: Podiatry;  Laterality: Right;   CARDIAC CATHETERIZATION  12/24/2020   CORONARY ARTERY BYPASS GRAFT N/A 12/27/2020   Procedure: CORONARY ARTERY BYPASS GRAFTING (CABG) X FOUR ON PUMP USING LEFT INTERNAL MAMMARY ARTERY AND RIGHT ENDOSCOPIC GREATER SAPHENOUS VEIN CONDUITS;  Surgeon: Corliss Skains, MD;  Location: [facility] OR;  Service: Open Heart Surgery;  Laterality: N/A;   LEFT HEART CATH AND CORONARY ANGIOGRAPHY N/A 12/24/2020   Procedure: LEFT HEART CATH AND CORONARY ANGIOGRAPHY;  Surgeon: Kathleene Hazel, MD;  Location: [facility];  Service: Cardiovascular;  Laterality: N/A;   PERIPHERAL VASCULAR BALLOON ANGIOPLASTY Right 01/29/2021   Procedure: PERIPHERAL VASCULAR BALLOON ANGIOPLASTY;  Surgeon: Nada Libman, MD;  Location: [facility];  Service: Cardiovascular;  Laterality: Right;  SFA and PT   TEE WITHOUT CARDIOVERSION N/A 12/27/2020   Procedure: TRANSESOPHAGEAL ECHOCARDIOGRAM (TEE);  Surgeon: Corliss Skains, MD;  Location: [facility] OR;  Service: Open Heart Surgery;  Laterality: N/A;     Social History   reports that he quit smoking about 3 months ago. His smoking use included cigarettes. He has a 40.00 pack-year smoking history. He has never used smokeless tobacco. He reports that he does not currently use alcohol. He reports that he does not use drugs.   Family History   His family history is not on file.   Allergies Allergies  Allergen Reactions   Atorvastatin Other (See Comments)    Myalgias    Ropinirole Other (See Comments)    Vivid dreams- MD stopped this med      Home Medications  Prior to Admission medications   Medication Sig Start Date End Date Taking? Authorizing Provider  acetaminophen (TYLENOL) 500 MG tablet Take 1,000 mg by mouth every 6 (six) hours as needed for moderate pain, mild pain or fever.   Yes [provider]  aspirin 81 MG chewable tablet Chew 1 tablet (81 mg total) by mouth daily. 09/20/20  Yes Nanavati, Ankit, MD  busPIRone (BUSPAR) 10 MG tablet Take 10 mg by mouth 2 (two) times daily. 07/06/20  Yes [provider]  carvedilol (COREG) 3.125 MG tablet Take 3.125 mg by mouth 2 (two) times daily. 02/16/21  Yes [provider]  clopidogrel (PLAVIX) 75 MG tablet Take 1 tablet (75 mg total) by mouth daily with breakfast. 03/19/21 03/14/22 Yes Alver Sorrow, NP  collagenase (SANTYL) ointment Apply 1 application topically daily. Patient taking differently: Apply 1 application topically See admin instructions. Use with wound care regimen as directed (right foot) 03/14/21  Yes Vivi Barrack, DPM  CYMBALTA 60 MG capsule Take 60 mg by mouth in the morning. 07/06/20  Yes [provider]  doxycycline (VIBRA-TABS) 100 MG tablet Take 1 tablet (100 mg total) by mouth 2 (two) times daily. 03/28/21  Yes Vivi Barrack, DPM  gabapentin (NEURONTIN) 300 MG capsule Take 300 mg by mouth 3 (three) times daily with meals.   Yes [provider]  glipiZIDE (GLUCOTROL) 10 MG tablet Take 10 mg by mouth 2 (two) times daily. 07/06/20  Yes [provider]  HYDROcodone-acetaminophen (NORCO/VICODIN) 5-325 MG tablet Take 1 tablet by mouth every 6 (six) hours as needed for moderate pain. 03/21/21  Yes Vivi Barrack, DPM  Insulin Glargine (BASAGLAR KWIKPEN) 100 UNIT/ML Inject 15 Units into the skin daily as needed (for elevated BGL). 07/06/20  Yes [provider]  metFORMIN (GLUCOPHAGE) 1000 MG tablet Take 500 mg by mouth 2 (two) times daily. 07/06/20  Yes [provider]  metoprolol tartrate  (LOPRESSOR) 25 MG tablet Take 0.5 tablets (12.5 mg total) by mouth 2 (two) times daily. 01/17/21  Yes Alver Sorrow, NP  midodrine (PROAMATINE) 10 MG tablet Take 1 tablet (10 mg total) by mouth 3 (three) times daily with meals. 01/17/21  Yes Alver Sorrow, NP  omeprazole (PRILOSEC) 40 MG capsule Take 40 mg by mouth daily before breakfast. 10/10/20  Yes [provider]  rosuvastatin (CRESTOR) 20 MG tablet Take 1 tablet (20 mg total) by mouth daily. 12/10/20  Yes Meriam Sprague, MD  topiramate (TOPAMAX) 50 MG tablet Take 1 tablet (50 mg total) by mouth 2 (two) times daily. 12/19/20  Yes Penumalli, Glenford Bayley, MD  Vitamin D,  Ergocalciferol, (DRISDOL) 1.25 MG (50000 UNIT) CAPS capsule Take 50,000 Units by mouth every Friday. 12/11/20  Yes [provider]  Accu-Chek Softclix Lancets lancets SMARTSIG:Topical 12/10/20   [provider]  diphenhydrAMINE (BENADRYL) 25 MG tablet Take 25 mg by mouth in the morning. Patient not taking: Reported on 04-29-21    [provider]  ferrous sulfate 325 (65 FE) MG tablet Take 325 mg by mouth daily with breakfast. Patient not taking: Reported on 29-Apr-2021    [provider]     Critical care time: N/A    Chari Manning, D.O.  Internal Medicine Resident, PGY-3 [facility]  Pager: [phone] 7:06 AM, 04/04/2021

## 2021-04-04 NOTE — Progress Notes (Addendum)
Patient with very liable blood pressures throughout the day today. Per Doctor orders decrease Neo and start Levo. Increase in Levo past titration orders per Dr. Marchia Bond orders throughout day to help with hypotension. Increase in Fentanyl to max of 200 due to vent dyssynchrony throughout shift today per Dr. Marchia Bond. Will continue to monitor. Dr. Marchia Bond and Dr. Denese Killings also agrees with plan to hold off on MRI for now until patient becomes more stable.

## 2021-04-04 NOTE — Progress Notes (Addendum)
Initial Nutrition Assessment  DOCUMENTATION CODES:   Not applicable  INTERVENTION:   Initiate tube feeds via OG tube: - Start Vital 1.5 @ 20 ml/hr and advance by 10 ml q 8 hours to goal rate of 50 ml/hr (1200 ml/day) - ProSource TF 45 ml daily  Tube feeding regimen at goal provides 1840 kcal, 92 grams of protein, and 917 ml of H2O.   - Plan to exchange OG tube for Cortrak tomorrow, order placed after discussion with CCM  Monitor magnesium, potassium, and phosphorus twice daily for at least 3 days, MD to replete as needed, as pt is at risk for refeeding syndrome.  NUTRITION DIAGNOSIS:   Increased nutrient needs related to acute illness (COVID-19 ARDS) as evidenced by estimated needs.  GOAL:   Patient will meet greater than or equal to 90% of their needs  MONITOR:   Vent status, Labs, Weight trends, TF tolerance, Skin, I & O's  REASON FOR ASSESSMENT:   Ventilator, Consult Enteral/tube feeding initiation and management  ASSESSMENT:   55 year old male who presented to the ED on 10/18 with fever, hypotension. 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 Pt tested positive for COVID-19. Pt admitted with sepsis and hypoxic respiratory failure secondary to COVID-19 pneumonia.  10/19 - intubated  Discussed pt with RN and during ICU rounds. Pt now with ARDS. CCM considering proning pt. Consult received for tube feeding initiation and management. Pt with OG tube in stomach per abdominal x-ray yesterday.  Reviewed weight history in chart. Weight has fluctuated between 54-60 kg over the last 6 months. No weights available prior to April 2022. Reviewed RD notes from August 2022 encounter. Pt with a history of severe malnutrition. Suspect severe malnutrition persists but unable to confirm at this time without NFPE.  Admit weight: 58.1 kg Current weight: 64.3 kg  RD to use admit weight of 58.1 kg as EDW given pt is net  positive 6.4 L since admit.  Patient is currently intubated on ventilator support MV: 8.1 L/min Temp (24hrs), Avg:100.3 F (37.9 C), Min:99.3 F (37.4 C), Max:101 F (38.3 C) BP (cuff): 97/64 MAP (cuff): 76  Drips: Propofol: 3.6 ml/hr (provides 95 kcal daily from lipid) Fentanyl Levophed Phenylephrine Vasopressin NS: 100 ml/hr  Medications reviewed and include: IV decadron, colace, SSI q 4 hours, IV protonix, miralax, IV abx, IV KCl 10 mEq x 4 runs  Labs reviewed: BUN 22, TG 228, hemoglobin 9.9 CBG's: 51-139 x 24 hours  UOP: 1260 ml x 24 hours I/O's: +6.4 L since admit  NUTRITION - FOCUSED PHYSICAL EXAM:  Unable to complete at this time. RD working remotely.  Diet Order:   Diet Order             Diet NPO time specified  Diet effective now                   EDUCATION NEEDS:   Not appropriate for education at this time  Skin:  Skin Assessment: Skin Integrity Issues: Incisions: open wound R foot  Last BM:  no documented BM  Height:   Ht Readings from Last 1 Encounters:  04-12-2021 5\' 7"  (1.702 m)    Weight:   Wt Readings from Last 1 Encounters:  04/04/21 64.3 kg    BMI:  Body mass index is 22.2 kg/m.  Estimated Nutritional Needs:   Kcal:  1700-1900  Protein:  80-95 grams  Fluid:  1.7-1.9 L  Gustavus Bryant, MS, RD, LDN Inpatient Clinical Dietitian Please see AMiON for contact information.

## 2021-04-05 ENCOUNTER — Inpatient Hospital Stay (HOSPITAL_COMMUNITY): Payer: Medicaid Other

## 2021-04-05 DIAGNOSIS — R6521 Severe sepsis with septic shock: Secondary | ICD-10-CM

## 2021-04-05 DIAGNOSIS — A419 Sepsis, unspecified organism: Secondary | ICD-10-CM | POA: Diagnosis not present

## 2021-04-05 LAB — POCT I-STAT 7, (LYTES, BLD GAS, ICA,H+H)
Acid-base deficit: 10 mmol/L — ABNORMAL HIGH (ref 0.0–2.0)
Acid-base deficit: 8 mmol/L — ABNORMAL HIGH (ref 0.0–2.0)
Acid-base deficit: 9 mmol/L — ABNORMAL HIGH (ref 0.0–2.0)
Bicarbonate: 17.7 mmol/L — ABNORMAL LOW (ref 20.0–28.0)
Bicarbonate: 19.9 mmol/L — ABNORMAL LOW (ref 20.0–28.0)
Bicarbonate: 18.6 mmol/L — ABNORMAL LOW (ref 20.0–28.0)
Calcium, Ion: 1.21 mmol/L (ref 1.15–1.40)
Calcium, Ion: 1.19 mmol/L (ref 1.15–1.40)
Calcium, Ion: 1.17 mmol/L (ref 1.15–1.40)
HCT: 27 % — ABNORMAL LOW (ref 39.0–52.0)
HCT: 25 % — ABNORMAL LOW (ref 39.0–52.0)
HCT: 24 % — ABNORMAL LOW (ref 39.0–52.0)
Hemoglobin: 9.2 g/dL — ABNORMAL LOW (ref 13.0–17.0)
Hemoglobin: 8.5 g/dL — ABNORMAL LOW (ref 13.0–17.0)
Hemoglobin: 8.2 g/dL — ABNORMAL LOW (ref 13.0–17.0)
O2 Saturation: 96 %
O2 Saturation: 99 %
O2 Saturation: 99 %
Patient temperature: 98.7
Patient temperature: 98.6
Potassium: 3.9 mmol/L (ref 3.5–5.1)
Potassium: 3.9 mmol/L (ref 3.5–5.1)
Potassium: 3.5 mmol/L (ref 3.5–5.1)
Sodium: 138 mmol/L (ref 135–145)
Sodium: 137 mmol/L (ref 135–145)
Sodium: 138 mmol/L (ref 135–145)
TCO2: 19 mmol/L — ABNORMAL LOW (ref 22–32)
TCO2: 22 mmol/L (ref 22–32)
TCO2: 20 mmol/L — ABNORMAL LOW (ref 22–32)
pCO2 arterial: 44.3 mmHg (ref 32.0–48.0)
pCO2 arterial: 55.5 mmHg — ABNORMAL HIGH (ref 32.0–48.0)
pCO2 arterial: 48.1 mmHg — ABNORMAL HIGH (ref 32.0–48.0)
pH, Arterial: 7.21 — ABNORMAL LOW (ref 7.350–7.450)
pH, Arterial: 7.164 — CL (ref 7.350–7.450)
pH, Arterial: 7.195 — CL (ref 7.350–7.450)
pO2, Arterial: 95 mmHg (ref 83.0–108.0)
pO2, Arterial: 153 mmHg — ABNORMAL HIGH (ref 83.0–108.0)
pO2, Arterial: 144 mmHg — ABNORMAL HIGH (ref 83.0–108.0)

## 2021-04-05 LAB — BASIC METABOLIC PANEL
Anion gap: 7 (ref 5–15)
BUN: 24 mg/dL — ABNORMAL HIGH (ref 6–20)
CO2: 18 mmol/L — ABNORMAL LOW (ref 22–32)
Calcium: 7.7 mg/dL — ABNORMAL LOW (ref 8.9–10.3)
Chloride: 110 mmol/L (ref 98–111)
Creatinine, Ser: 0.75 mg/dL (ref 0.61–1.24)
GFR, Estimated: >60 mL/min (ref >60)
Glucose, Bld: 246 mg/dL — ABNORMAL HIGH (ref 70–99)
Potassium: 3.9 mmol/L (ref 3.5–5.1)
Sodium: 135 mmol/L (ref 135–145)

## 2021-04-05 LAB — CBC WITH DIFFERENTIAL/PLATELET
Abs Immature Granulocytes: 0.05 10*3/uL (ref 0.00–0.07)
Basophils Absolute: 0 10*3/uL (ref 0.0–0.1)
Basophils Relative: 1 %
Eosinophils Absolute: 0 10*3/uL (ref 0.0–0.5)
Eosinophils Relative: 0 %
HCT: 28.6 % — ABNORMAL LOW (ref 39.0–52.0)
Hemoglobin: 9.2 g/dL — ABNORMAL LOW (ref 13.0–17.0)
Immature Granulocytes: 2 %
Lymphocytes Relative: 8 %
Lymphs Abs: 0.3 10*3/uL — ABNORMAL LOW (ref 0.7–4.0)
MCH: 26.6 pg (ref 26.0–34.0)
MCHC: 32.2 g/dL (ref 30.0–36.0)
MCV: 82.7 fL (ref 80.0–100.0)
Monocytes Absolute: 0.3 10*3/uL (ref 0.1–1.0)
Monocytes Relative: 8 %
Neutro Abs: 2.7 10*3/uL (ref 1.7–7.7)
Neutrophils Relative %: 81 %
Platelets: 112 10*3/uL — ABNORMAL LOW (ref 150–400)
RBC: 3.46 MIL/uL — ABNORMAL LOW (ref 4.22–5.81)
RDW: 15.9 % — ABNORMAL HIGH (ref 11.5–15.5)
WBC: 3.2 10*3/uL — ABNORMAL LOW (ref 4.0–10.5)
nRBC: 0 % (ref 0.0–0.2)

## 2021-04-05 LAB — GLUCOSE, CAPILLARY
Glucose-Capillary: 73 mg/dL (ref 70–99)
Glucose-Capillary: 196 mg/dL — ABNORMAL HIGH (ref 70–99)
Glucose-Capillary: 181 mg/dL — ABNORMAL HIGH (ref 70–99)
Glucose-Capillary: 260 mg/dL — ABNORMAL HIGH (ref 70–99)
Glucose-Capillary: 291 mg/dL — ABNORMAL HIGH (ref 70–99)
Glucose-Capillary: 278 mg/dL — ABNORMAL HIGH (ref 70–99)
Glucose-Capillary: 209 mg/dL — ABNORMAL HIGH (ref 70–99)

## 2021-04-05 LAB — MAGNESIUM
Magnesium: 2 mg/dL (ref 1.7–2.4)
Magnesium: 1.9 mg/dL (ref 1.7–2.4)

## 2021-04-05 LAB — FERRITIN: Ferritin: 655 ng/mL — ABNORMAL HIGH (ref 24–336)

## 2021-04-05 LAB — PHOSPHORUS
Phosphorus: 1.9 mg/dL — ABNORMAL LOW (ref 2.5–4.6)
Phosphorus: 1.9 mg/dL — ABNORMAL LOW (ref 2.5–4.6)

## 2021-04-05 LAB — D-DIMER, QUANTITATIVE: D-Dimer, Quant: 5.1 ug/mL-FEU — ABNORMAL HIGH (ref 0.00–0.50)

## 2021-04-05 LAB — C-REACTIVE PROTEIN: CRP: 43.1 mg/dL — ABNORMAL HIGH (ref <1.0)

## 2021-04-05 LAB — PROCALCITONIN: Procalcitonin: 35.4 ng/mL

## 2021-04-05 IMAGING — DX DG CHEST 1V PORT
1 series · 1 of 1 positions shown · non-contrast
Comparison: CT angio chest [DATE], chest x-ray [DATE] [DATE]
a.m.

CLINICAL DATA: Multifocal infection/inflammation increasing
shortness of breath, endotracheally intubated. Hypotension.

EXAM:
PORTABLE CHEST 1 VIEW

[chest]
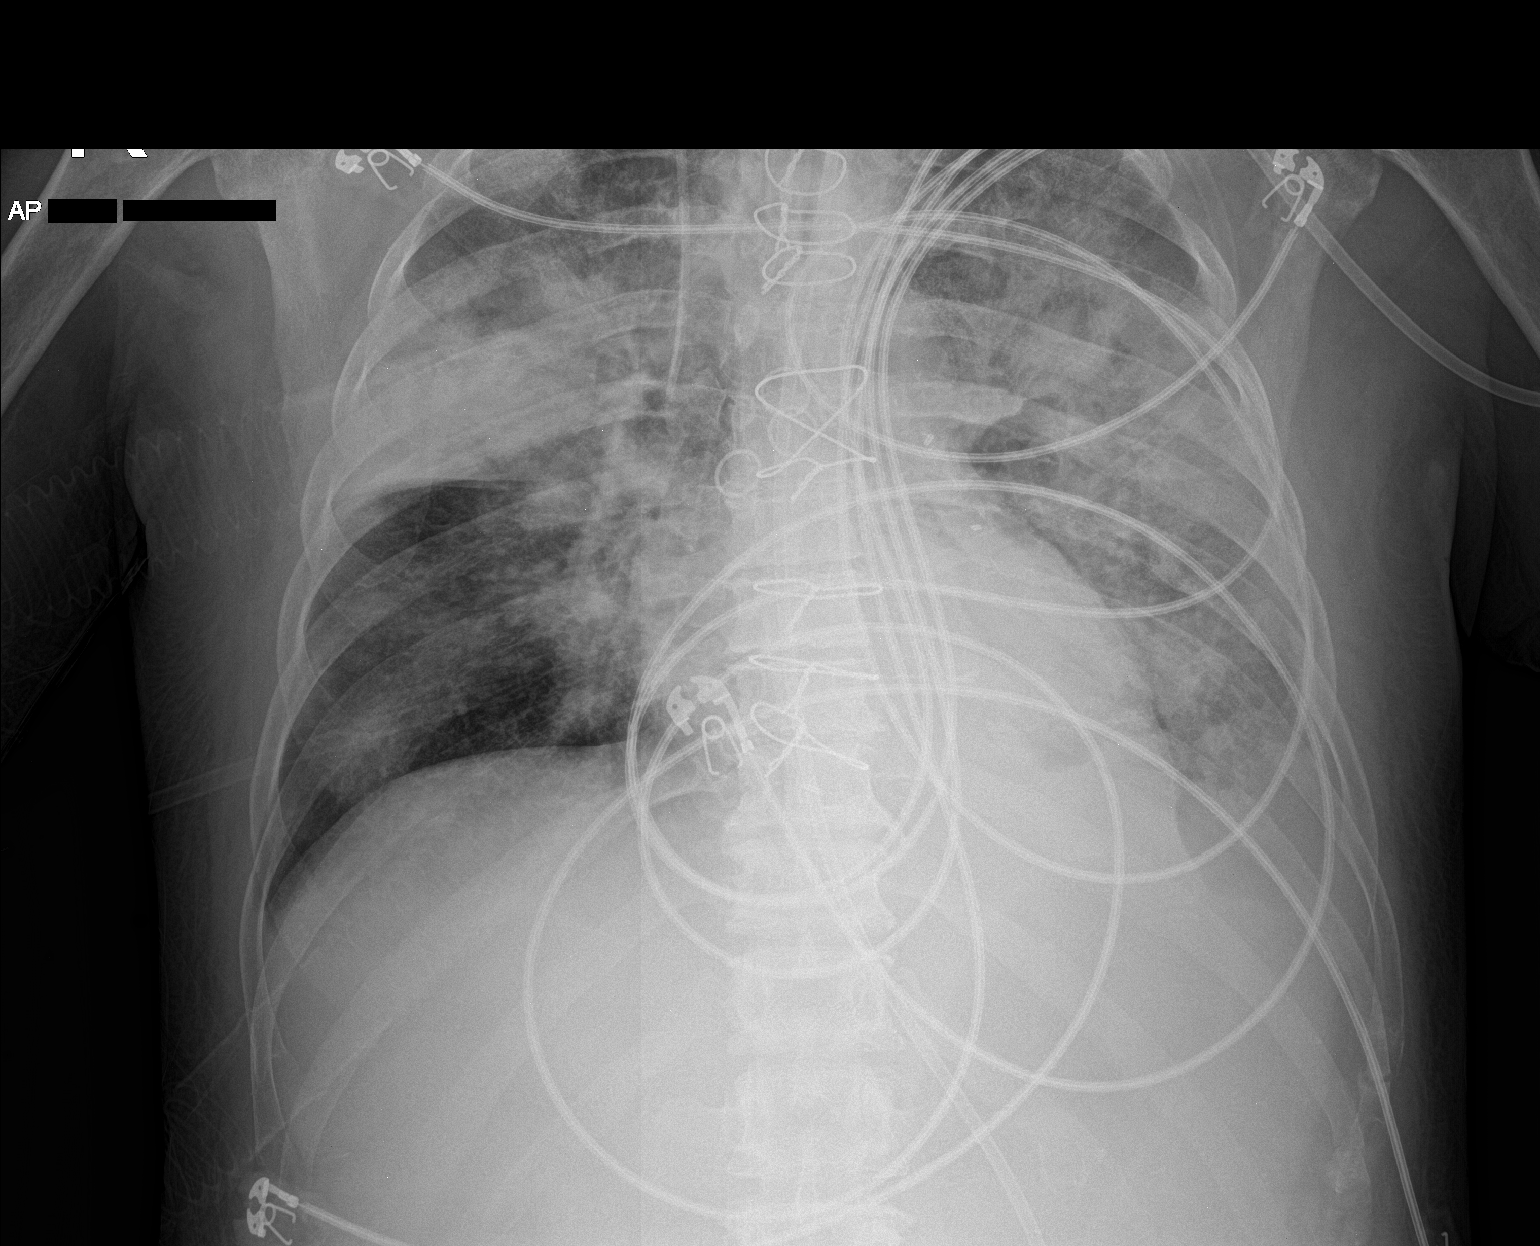

[1 of 1 positions shown; findings below may reference images not displayed]

FINDINGS: Redemonstration of a right internal jugular central venous catheter
with tip overlying the expected region of the distal superior vena
cava. Endotracheal tube noted mid line with tip poorly visualized
but likely approximately 3 cm above the carina. Enteric tube noted
coursing below the hemidiaphragm with tip collimated off view.

The heart and mediastinal contours are unchanged. Cardiac surgical
changes overlie the mediastinum

Diffuse patchy airspace opacities are again noted with most focal
consolidation within the right upper lobe. No pulmonary edema. Trace
left pleural effusion. No pneumothorax.

No acute osseous abnormality.
IMPRESSION: 1. Multifocal infection/inflammation. Followup PA and lateral chest
X-ray is recommended in 3-4 weeks following therapy to ensure
resolution.
2. Trace left pleural effusion.
3. Lines and tubes as described above.

## 2021-04-05 IMAGING — DX DG ABD PORTABLE 1V
1 series · 1 of 1 positions shown · non-contrast
Comparison: None.

CLINICAL DATA: Feeding tube placement.

EXAM:
PORTABLE ABDOMEN - 1 VIEW

[abdomen]
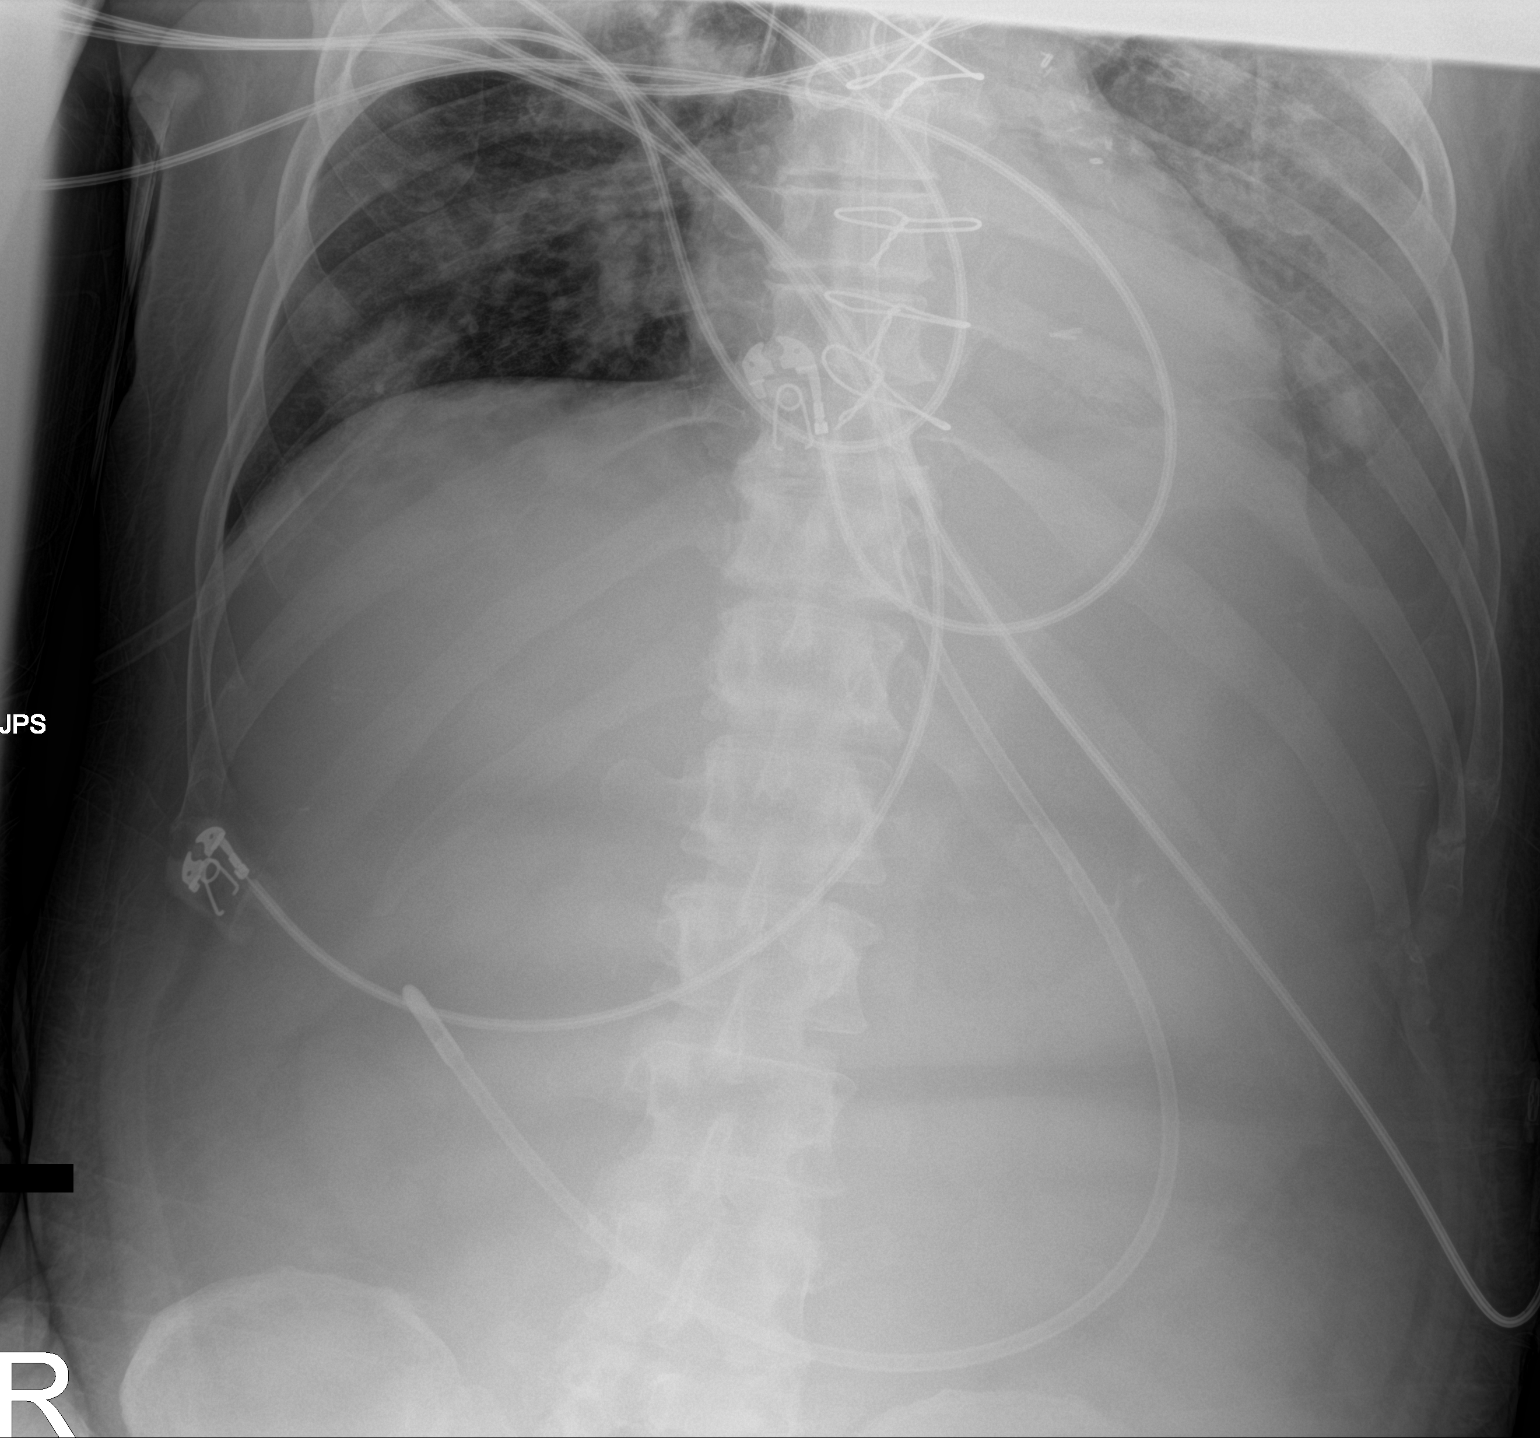

[1 of 1 positions shown; findings below may reference images not displayed]

FINDINGS: The bowel gas pattern is normal. Distal tip of feeding tube is seen
in expected position of distal stomach. No radio-opaque calculi or
other significant radiographic abnormality are seen.
IMPRESSION: Distal tip of feeding tube seen in expected position of distal
stomach.

## 2021-04-05 IMAGING — DX DG CHEST 1V PORT
1 series · 1 of 1 positions shown · non-contrast
Comparison: [DATE]

CLINICAL DATA: Shortness of breath

EXAM:
PORTABLE CHEST 1 VIEW

[chest]
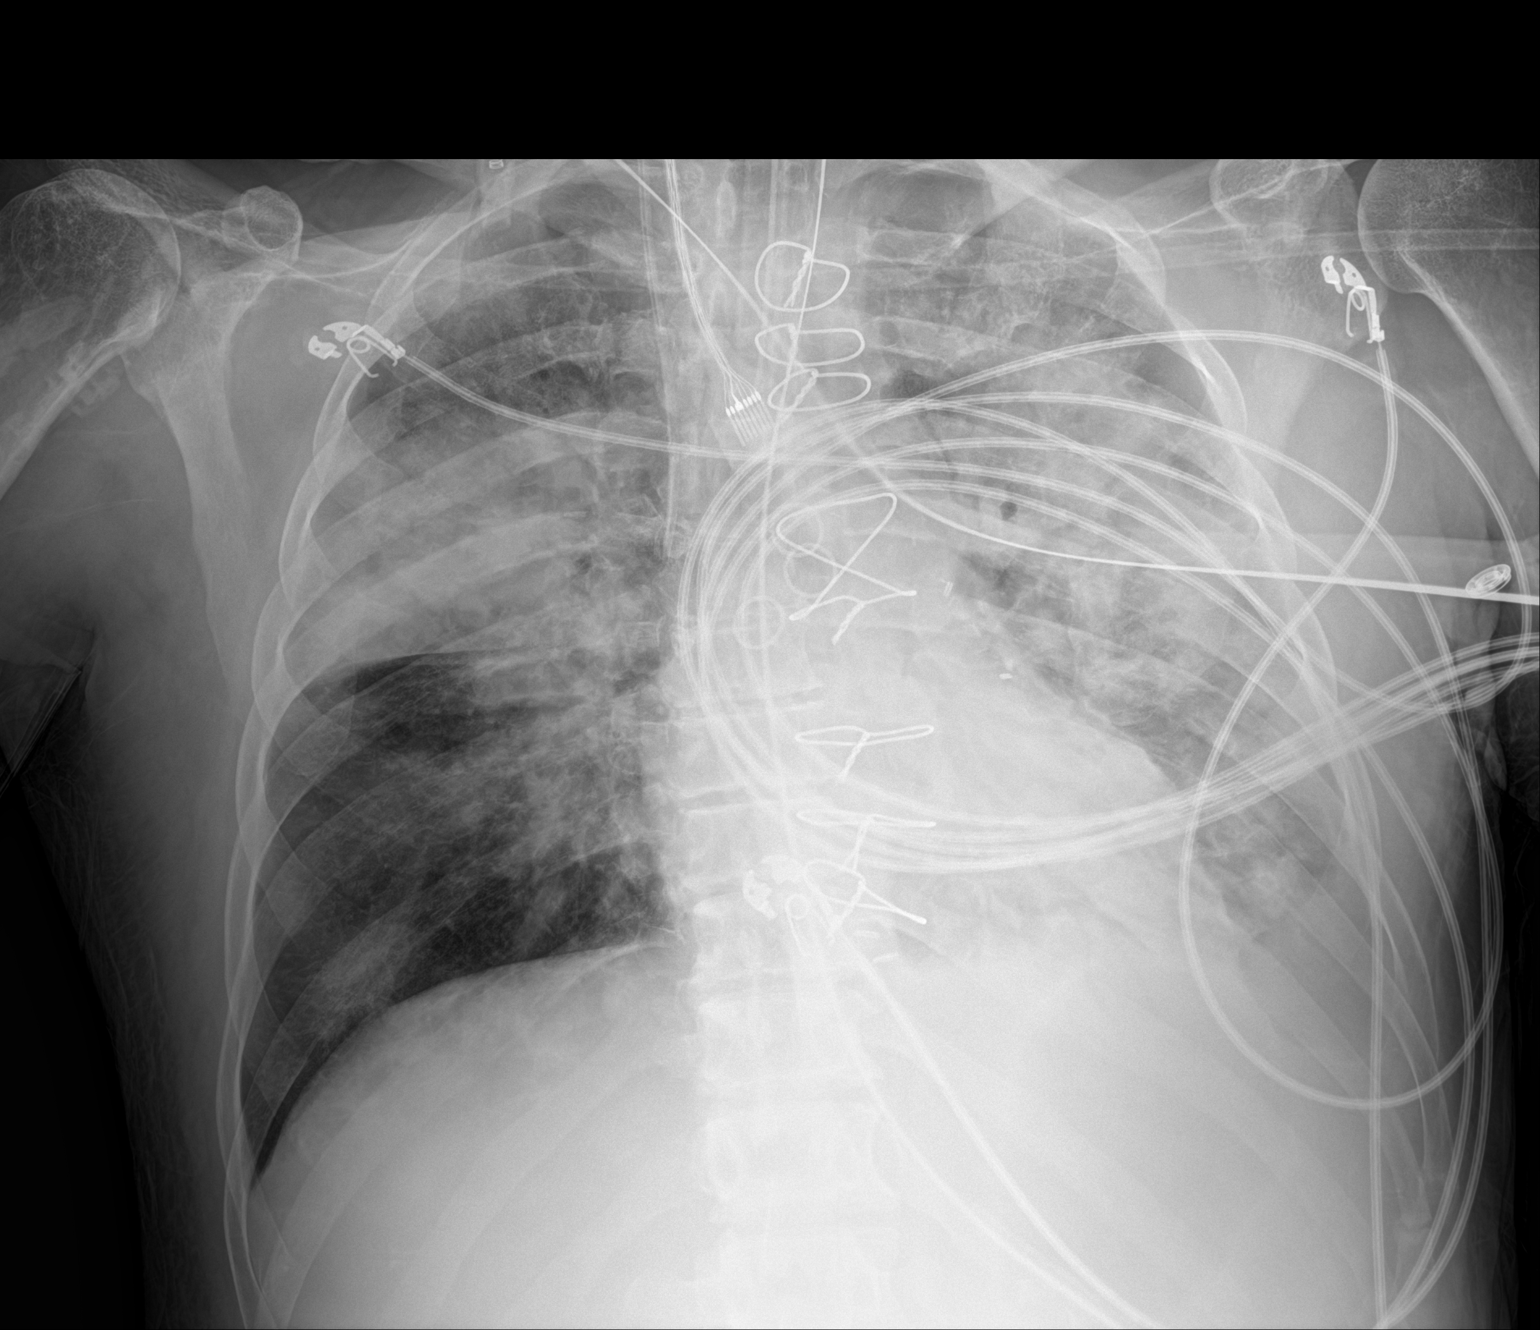

[1 of 1 positions shown; findings below may reference images not displayed]

FINDINGS: Endotracheal tube and gastric catheter are again seen and stable.
Right jugular central line is noted in the mid superior vena cava.
Diffuse increased airspace opacity is again identified right greater
than left. There has been some progression in the right upper lobe
when compared with the prior exam. No pneumothorax is seen.
IMPRESSION: Bilateral airspace opacities worse on the left than the right with
some interval increase in the right upper lobe.

Tubes and lines as described.

## 2021-04-05 MED ORDER — POTASSIUM PHOSPHATES 15 MMOLE/5ML IV SOLN
30.0000 mmol | Freq: Once | INTRAVENOUS | Status: AC
  Administered 2021-04-05: 30 mmol via INTRAVENOUS
  Filled 2021-04-05: qty 10

## 2021-04-05 NOTE — Procedures (Signed)
Cortrak  Tube Type:  Cortrak - 43 inches Tube Location:  Left nare Initial Placement:  Stomach Secured by: Bridle Technique Used to Measure Tube Placement:  Marking at nare/corner of mouth Cortrak Secured At:  80 cm   Cortrak Tube Team Note:  Consult received to place a Cortrak feeding tube.   X-ray is required, abdominal x-ray has been ordered by the Cortrak team. Please confirm tube placement before using the Cortrak tube.   If the tube becomes dislodged please keep the tube and contact the Cortrak team at [email or web address] (password [email or web address]) for replacement.  If after hours and replacement cannot be delayed, place a NG tube and confirm placement with an abdominal x-ray.    Keondra Haydu MS, RD, LDN Please refer to AMION for RD and/or RD on-call/weekend/after hours pager   

## 2021-04-05 NOTE — Progress Notes (Signed)
1550- Physician notified of recurrent desaturations into the mid-80s. RT in room with RN, recruitment maneuver performed and increased fi02 1600- per physician, chest ray and ABG for continued hypoxia

## 2021-04-05 NOTE — Progress Notes (Addendum)
RT spoke with physician about ABG. No changes to be made at this time other than wean FiO2 and will increase PEEP if needed.

## 2021-04-05 NOTE — Progress Notes (Signed)
RT performed recruitment maneuver on patient to help with oxygenation. Patient tolerated well. RT increased patient's FiO2, PEEP, and respiratory rate to help with oxygenation and elevated CO2.

## 2021-04-06 ENCOUNTER — Inpatient Hospital Stay (HOSPITAL_COMMUNITY): Payer: Medicaid Other

## 2021-04-06 DIAGNOSIS — U071 COVID-19: Secondary | ICD-10-CM

## 2021-04-06 DIAGNOSIS — J189 Pneumonia, unspecified organism: Secondary | ICD-10-CM | POA: Diagnosis not present

## 2021-04-06 DIAGNOSIS — A419 Sepsis, unspecified organism: Secondary | ICD-10-CM | POA: Diagnosis not present

## 2021-04-06 DIAGNOSIS — J9601 Acute respiratory failure with hypoxia: Secondary | ICD-10-CM

## 2021-04-06 DIAGNOSIS — R6521 Severe sepsis with septic shock: Secondary | ICD-10-CM | POA: Diagnosis not present

## 2021-04-06 LAB — CBC WITH DIFFERENTIAL/PLATELET
Abs Immature Granulocytes: 0 10*3/uL (ref 0.00–0.07)
Basophils Absolute: 0 10*3/uL (ref 0.0–0.1)
Basophils Relative: 0 %
Eosinophils Absolute: 0 10*3/uL (ref 0.0–0.5)
Eosinophils Relative: 0 %
HCT: 26.4 % — ABNORMAL LOW (ref 39.0–52.0)
Hemoglobin: 8.4 g/dL — ABNORMAL LOW (ref 13.0–17.0)
Lymphocytes Relative: 13 %
Lymphs Abs: 0.2 10*3/uL — ABNORMAL LOW (ref 0.7–4.0)
MCH: 26.7 pg (ref 26.0–34.0)
MCHC: 31.8 g/dL (ref 30.0–36.0)
MCV: 83.8 fL (ref 80.0–100.0)
Monocytes Absolute: 0.6 10*3/uL (ref 0.1–1.0)
Monocytes Relative: 37 %
Neutro Abs: 0.8 10*3/uL — ABNORMAL LOW (ref 1.7–7.7)
Neutrophils Relative %: 50 %
Platelets: 90 10*3/uL — ABNORMAL LOW (ref 150–400)
RBC: 3.15 MIL/uL — ABNORMAL LOW (ref 4.22–5.81)
RDW: 16.3 % — ABNORMAL HIGH (ref 11.5–15.5)
WBC: 1.6 10*3/uL — ABNORMAL LOW (ref 4.0–10.5)
nRBC: 1.2 % — ABNORMAL HIGH (ref 0.0–0.2)
nRBC: 6 /100 WBC — ABNORMAL HIGH

## 2021-04-06 LAB — BASIC METABOLIC PANEL
Anion gap: 6 (ref 5–15)
Anion gap: 9 (ref 5–15)
BUN: 23 mg/dL — ABNORMAL HIGH (ref 6–20)
BUN: 24 mg/dL — ABNORMAL HIGH (ref 6–20)
CO2: 19 mmol/L — ABNORMAL LOW (ref 22–32)
CO2: 22 mmol/L (ref 22–32)
Calcium: 7.5 mg/dL — ABNORMAL LOW (ref 8.9–10.3)
Calcium: 7.1 mg/dL — ABNORMAL LOW (ref 8.9–10.3)
Chloride: 112 mmol/L — ABNORMAL HIGH (ref 98–111)
Chloride: 109 mmol/L (ref 98–111)
Creatinine, Ser: 0.64 mg/dL (ref 0.61–1.24)
Creatinine, Ser: 0.67 mg/dL (ref 0.61–1.24)
GFR, Estimated: >60 mL/min (ref >60)
GFR, Estimated: >60 mL/min (ref >60)
Glucose, Bld: 108 mg/dL — ABNORMAL HIGH (ref 70–99)
Glucose, Bld: 113 mg/dL — ABNORMAL HIGH (ref 70–99)
Potassium: 2.6 mmol/L — CL (ref 3.5–5.1)
Potassium: 3.3 mmol/L — ABNORMAL LOW (ref 3.5–5.1)
Sodium: 137 mmol/L (ref 135–145)
Sodium: 140 mmol/L (ref 135–145)

## 2021-04-06 LAB — POCT I-STAT 7, (LYTES, BLD GAS, ICA,H+H)
Acid-base deficit: 10 mmol/L — ABNORMAL HIGH (ref 0.0–2.0)
Bicarbonate: 17.3 mmol/L — ABNORMAL LOW (ref 20.0–28.0)
Calcium, Ion: 1.16 mmol/L (ref 1.15–1.40)
HCT: 23 % — ABNORMAL LOW (ref 39.0–52.0)
Hemoglobin: 7.8 g/dL — ABNORMAL LOW (ref 13.0–17.0)
O2 Saturation: 95 %
Patient temperature: 100.9
Potassium: 2.7 mmol/L — CL (ref 3.5–5.1)
Sodium: 142 mmol/L (ref 135–145)
TCO2: 19 mmol/L — ABNORMAL LOW (ref 22–32)
pCO2 arterial: 49.3 mmHg — ABNORMAL HIGH (ref 32.0–48.0)
pH, Arterial: 7.16 — CL (ref 7.350–7.450)
pO2, Arterial: 100 mmHg (ref 83.0–108.0)

## 2021-04-06 LAB — MAGNESIUM
Magnesium: 1.9 mg/dL (ref 1.7–2.4)
Magnesium: 2 mg/dL (ref 1.7–2.4)

## 2021-04-06 LAB — GLUCOSE, CAPILLARY
Glucose-Capillary: 103 mg/dL — ABNORMAL HIGH (ref 70–99)
Glucose-Capillary: 115 mg/dL — ABNORMAL HIGH (ref 70–99)
Glucose-Capillary: 121 mg/dL — ABNORMAL HIGH (ref 70–99)
Glucose-Capillary: 80 mg/dL (ref 70–99)
Glucose-Capillary: 86 mg/dL (ref 70–99)
Glucose-Capillary: 92 mg/dL (ref 70–99)

## 2021-04-06 LAB — VANCOMYCIN, PEAK: Vancomycin Pk: 65 ug/mL (ref 30–40)

## 2021-04-06 LAB — PHOSPHORUS
Phosphorus: 1.1 mg/dL — ABNORMAL LOW (ref 2.5–4.6)
Phosphorus: 3.9 mg/dL (ref 2.5–4.6)

## 2021-04-06 LAB — DIC (DISSEMINATED INTRAVASCULAR COAGULATION)PANEL
D-Dimer, Quant: 5.25 ug/mL-FEU — ABNORMAL HIGH (ref 0.00–0.50)
Fibrinogen: 682 mg/dL — ABNORMAL HIGH (ref 210–475)
INR: 1.3 — ABNORMAL HIGH (ref 0.8–1.2)
Platelets: 94 10*3/uL — ABNORMAL LOW (ref 150–400)
Prothrombin Time: 15.8 seconds — ABNORMAL HIGH (ref 11.4–15.2)
Smear Review: NONE SEEN
aPTT: 40 seconds — ABNORMAL HIGH (ref 24–36)

## 2021-04-06 LAB — C-REACTIVE PROTEIN: CRP: 22.4 mg/dL — ABNORMAL HIGH (ref <1.0)

## 2021-04-06 LAB — FERRITIN: Ferritin: 590 ng/mL — ABNORMAL HIGH (ref 24–336)

## 2021-04-06 LAB — D-DIMER, QUANTITATIVE: D-Dimer, Quant: 6.49 ug/mL-FEU — ABNORMAL HIGH (ref 0.00–0.50)

## 2021-04-06 IMAGING — DX DG CHEST 1V
1 series · 1 of 1 positions shown · non-contrast
Comparison: Multiple prior studies most recent [DATE].

CLINICAL DATA: A 54-year-old male presents for evaluation of
shortness breath and diffuse pneumonia/pneumonitis.

EXAM:
CHEST  1 VIEW

[chest ap]
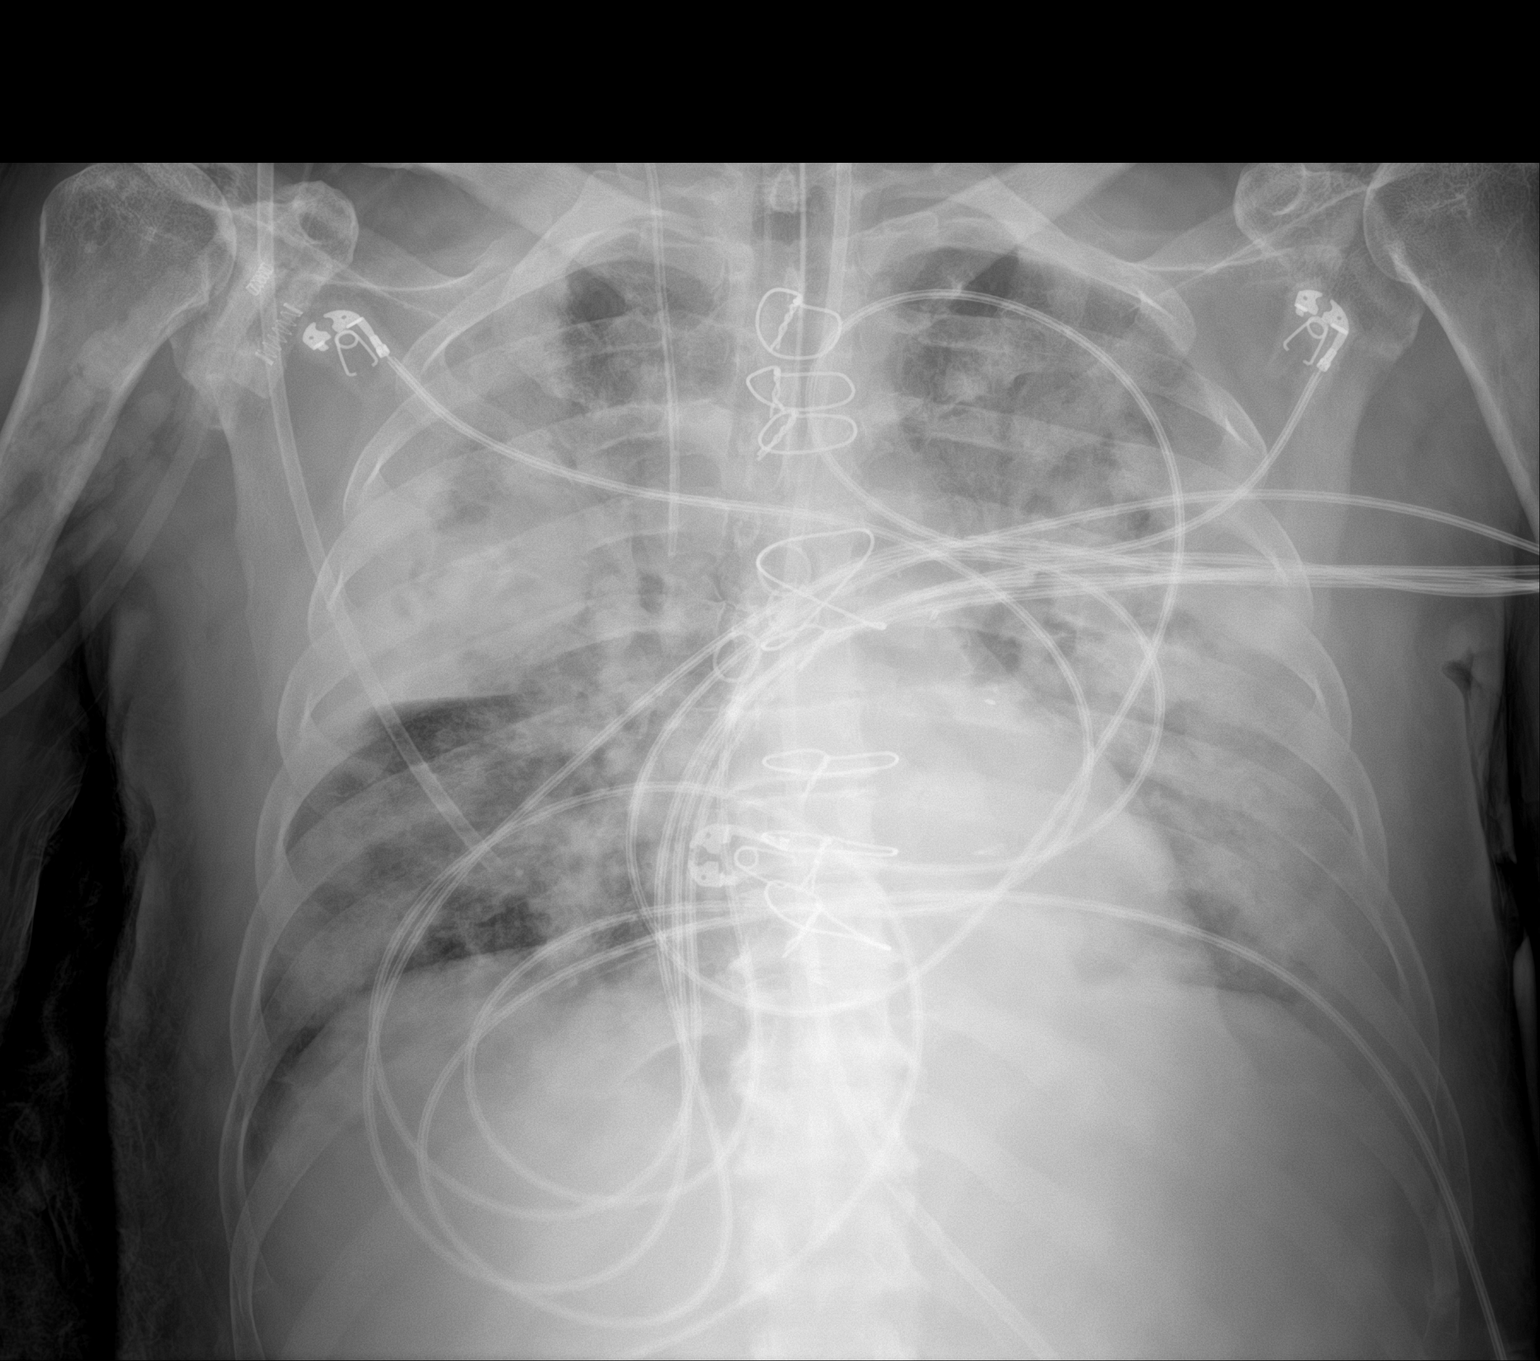

[1 of 1 positions shown; findings below may reference images not displayed]

FINDINGS: EKG leads project over the chest. The patient is intubated with
endotracheal tube approximately 1-1.5 above the carina, this appears
to been advanced since the prior radiograph of [DATE].

RIGHT-sided IJ central venous catheter terminates at the distal
superior vena cava also unchanged with respect position. Feeding
tube courses through and off the field of the radiograph.w

Post median sternotomy and CABG as before. Cardiomediastinal
contours are similar to previous imaging though are largely obscured
due to worsening bilateral airspace disease. Worsening is seen
throughout the chest most visible in the RIGHT lower lobe which was
relatively spared on the previous study.

On limited assessment there is no acute skeletal process.
IMPRESSION: Marked interval worsening of bilateral airspace disease most visible
in the RIGHT lower lobe. Findings compatible with multifocal
infection, developing ARDS is also considered. Follow-up is
suggested to ensure resolution.

Endotracheal tube appears to have been advanced slightly since the
prior radiograph of [DATE] [DATE], [DATE]. [DATE] be slightly low lying
likely within 1 to 1.5 cm of the carina.

These results will be called to the ordering clinician or
representative by the Radiologist Assistant, and communication
documented in the PACS or [REDACTED].

## 2021-04-06 MED ORDER — MAGNESIUM SULFATE 2 GM/50ML IV SOLN
2.0000 g | Freq: Once | INTRAVENOUS | Status: AC
  Administered 2021-04-06: 2 g via INTRAVENOUS
  Filled 2021-04-06: qty 50

## 2021-04-06 MED ORDER — POTASSIUM PHOSPHATES 15 MMOLE/5ML IV SOLN
45.0000 mmol | Freq: Once | INTRAVENOUS | Status: AC
  Administered 2021-04-06: 45 mmol via INTRAVENOUS
  Filled 2021-04-06: qty 15

## 2021-04-06 MED ORDER — FUROSEMIDE 10 MG/ML IJ SOLN
10.0000 mg/h | INTRAVENOUS | Status: DC
  Administered 2021-04-06: 8 mg/h via INTRAVENOUS
  Administered 2021-04-07: 10 mg/h via INTRAVENOUS
  Filled 2021-04-06 (×2): qty 20

## 2021-04-06 MED ORDER — POTASSIUM CHLORIDE 20 MEQ PO PACK
40.0000 meq | PACK | Freq: Once | ORAL | Status: AC
  Administered 2021-04-06: 40 meq
  Filled 2021-04-06: qty 2

## 2021-04-06 MED ORDER — STERILE WATER FOR INJECTION IV SOLN
INTRAVENOUS | Status: DC
  Filled 2021-04-06 (×3): qty 1000

## 2021-04-06 MED ORDER — LACTATED RINGERS IV BOLUS
1000.0000 mL | Freq: Once | INTRAVENOUS | Status: AC
  Administered 2021-04-06: 1000 mL via INTRAVENOUS

## 2021-04-06 MED ORDER — HYDROCORTISONE SOD SUC (PF) 100 MG IJ SOLR
100.0000 mg | Freq: Two times a day (BID) | INTRAMUSCULAR | Status: DC
  Administered 2021-04-06 – 2021-04-07 (×3): 100 mg via INTRAVENOUS
  Filled 2021-04-06 (×3): qty 2

## 2021-04-06 MED ORDER — SODIUM BICARBONATE 8.4 % IV SOLN
INTRAVENOUS | Status: AC
  Filled 2021-04-06: qty 50

## 2021-04-06 MED ORDER — POTASSIUM CHLORIDE 20 MEQ PO PACK
20.0000 meq | PACK | Freq: Once | ORAL | Status: AC
  Administered 2021-04-06: 20 meq
  Filled 2021-04-06: qty 1

## 2021-04-06 MED ORDER — ENOXAPARIN SODIUM 300 MG/3ML IJ SOLN
0.5000 mg/kg | Freq: Two times a day (BID) | INTRAMUSCULAR | Status: DC
  Administered 2021-04-06: 35 mg via SUBCUTANEOUS
  Filled 2021-04-06 (×3): qty 0.35

## 2021-04-06 MED ORDER — SODIUM BICARBONATE 8.4 % IV SOLN
100.0000 meq | Freq: Once | INTRAVENOUS | Status: AC
  Administered 2021-04-06: 100 meq via INTRAVENOUS
  Filled 2021-04-06: qty 100

## 2021-04-06 MED ORDER — POTASSIUM CHLORIDE 20 MEQ PO PACK: 40.0000 meq | PACK | Freq: Once | ORAL | Status: DC

## 2021-04-06 MED ORDER — ENOXAPARIN SODIUM 80 MG/0.8ML IJ SOSY
1.0000 mg/kg | PREFILLED_SYRINGE | Freq: Two times a day (BID) | INTRAMUSCULAR | Status: DC
  Administered 2021-04-06: 75 mg via SUBCUTANEOUS
  Filled 2021-04-06: qty 0.8

## 2021-04-06 MED ORDER — SODIUM BICARBONATE 8.4 % IV SOLN
100.0000 meq | Freq: Once | INTRAVENOUS | Status: AC
  Administered 2021-04-06: 100 meq via INTRAVENOUS
  Filled 2021-04-06: qty 50

## 2021-04-06 MED ORDER — SODIUM CHLORIDE 0.9 % IV BOLUS
1000.0000 mL | Freq: Once | INTRAVENOUS | Status: AC
  Administered 2021-04-06: 1000 mL via INTRAVENOUS

## 2021-04-06 MED ORDER — PROPOFOL 1000 MG/100ML IV EMUL
0.0000 ug/kg/min | INTRAVENOUS | Status: AC
  Administered 2021-04-06 – 2021-04-07 (×3): 30 ug/kg/min via INTRAVENOUS
  Filled 2021-04-06 (×3): qty 100

## 2021-04-06 NOTE — Progress Notes (Signed)
eLink Physician-Brief Progress Note Patient Name: [name] DOB: [birth year] MRN: [id number]   Date of Service  04/06/2021  HPI/Events of Note  ABG on 30%/PRVC 30/TV 390/P 12 = 7.16/49.3/100/17.3.  eICU Interventions  Plan: Increase PRVC rate to 35. NaHCO3 100 meq IV now. Repeat ABG at 8 AM.      Intervention Category Major Interventions: Acid-Base disturbance - evaluation and management;Respiratory failure - evaluation and management  [name] 04/06/2021, 5:05 AM

## 2021-04-06 NOTE — Progress Notes (Signed)
Failed proning trial, desaturating to 70s for over an hour, unable to re-recruit so returned to supine position, Lasix gtt is only other thing that could help, will attempt. Family again updated regarding grim prognosis, want everything done for now.  Myrla Halsted MD PCCM

## 2021-04-06 NOTE — Progress Notes (Addendum)
eLink Physician-Brief Progress Note Patient Name: [name] DOB: March 05, 1966 MRN: [id number]   Date of Service  04/06/2021  HPI/Events of Note  Sinus tachycardia - HR = 122. LVEF 55-60%.  eICU Interventions  Bolus with 0.9 NaCl 1 liter IV over 1 hour now.      Intervention Category Major Interventions: Arrhythmia - evaluation and management  Eliot Popper Eugene 04/06/2021, 4:27 AM

## 2021-04-06 NOTE — Progress Notes (Signed)
NAME:  [name], MRN:  [id number], DOB:  Oct 22, 1965, LOS: 4 ADMISSION DATE:  03/17/2021, CONSULTATION DATE:  2021/04/06 REFERRING MD:  Danise Edge, CHIEF COMPLAINT:  SHOB   Brief History   [name] is a 55 y.o. with a pertinent PMH of HLD, HTN, osteomyelitis, CAD SP CABG x4, who presented with fevers, chills, cough,  and SHOB. On admission, patient developed acute hypoxic respiratory failure requiring intubation and admitted for COVID pneumonia.   Past Medical History   has a past medical history of Anginal pain (HCC), Ataxia, CAD (coronary artery disease), Diabetes mellitus without complication (HCC), Dizziness, Hyperlipidemia, Hypertension, and Near syncope. Significant Hospital Events   10/18 admitted 04/07/23 hypoxic respiratory failure s/p intubation   Consults:    Procedures:  04/07/23 intubated 04-07-2023 CVC  Significant Diagnostic Tests:  CTA  Result Date: 04/14/2021 No evidence of pulmonary embolism. Multifocal pneumonia in this patient with known COVID, left lower lobe predominant. Small left pleural effusion. Aortic Atherosclerosis (ICD10-I70.0).  DG Abd 1 View Result Date: Apr 06, 2021 IMPRESSION: NG tip in the region of the gastric antrum or pylorus.   DG CHEST PORT 1 VIEW Result Date: April 06, 2021 IMPRESSION: Tubes and lines as described above.  No pneumothorax is noted. Remainder of the exam is stable from the prior study.   DG CHEST PORT 1 VIEW Result Date: 04-06-2021 Multifocal pneumonia with similar appearance to the chest CT acquired at 2:31 a.m.   Micro Data:  10/18 COVID positive  10/18 MRSA PCR positive  10/18 Bcx no growth at 24 hours Ucx; negative ' Pct 22 on admit  Antimicrobials:  Cefepime 10/18> Vanc 10/18>   Interim History/Subjective:  Not looking too good this AM. Pressors and O2 needs up.  Objective:  Blood pressure 112/85, pulse (!) 103, temperature (!) 100.9 F (38.3 C), temperature source Oral, resp. rate (!) 29, height 5\' 7"   (1.702 m), weight 74.8 kg, SpO2 95 %. CVP:  [4 mmHg-17 mmHg] 16 mmHg  Vent Mode: PRVC FiO2 (%):  [50 %-100 %] 90 % Set Rate:  [22 bmp-35 bmp] 35 bmp Vt Set:  [390 mL] 390 mL PEEP:  [10 cmH20-12 cmH20] 12 cmH20 Plateau Pressure:  [23 cmH20-30 cmH20] 30 cmH20   Intake/Output Summary (Last 24 hours) at 04/06/2021 0817 Last data filed at 04/06/2021 0800 Gross per 24 hour  Intake 4875.8 ml  Output 1551 ml  Net 3324.8 ml    Filed Weights   04/04/21 0408 04/05/21 0408 04/06/21 0530  Weight: 64.3 kg 70.3 kg 74.8 kg    Examination: Ill appearing man sedated on vent Lungs with scattered rhonci, passive on vent Pupils pinpoint, reactive Heart sounds regular, sinus on monitor RLE wrapped without strikethrough RASS -5 Minimal edema  On levo/vaso  Lab review worsening pancytopenia, plts pending CXR pending   Resolved Hospital Problem list   none  Assessment & Plan:  COVID ARDS +/- CAP- intubated 04-07-2023; severely high dead space RLE wound with 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; MRI LE at some point Shock- question infection (Pct 23 on admit) or sedative effects, intravascularly dry by Agarwala POCUS on 10/20 Intermittent hypoglycemia Worsening pancytopenia- concening in context of worsening shock  - ARDSNet protocol lung protective TV and generous PEEP; limit DP to < 15cmH2O as able: 18 today - Dexamethasone to hydrocortisone given worsening shock, give another L LR - Sedation titrated to vent synchrony - May need NMB and proning today - Wean levophed/vasopressin to MAP 65; albumin as ordered -  Check stat LE duplex, f/u plts, if dropping, start argatroban and check HIT panel - Continue broad spectrum abx pending result of MRI and further culture data - f/u AM BMP - D20 gtt for now: CBG in 100s in AM - Guarded prognosis: may need to call family in if we cannot ventilate him and shock worsens  Best Practice:  Diet:  tubefeeds Pain/Anxiety/Delirium protocol (if indicated): yes,  VAP protocol (if indicated): yes DVT prophylaxis: prophylactic heparin  GI prophylaxis: PPI Glucose control: SSI Yes  Lines: Central line Foley:  Yes, and it is still needed Mobility: bed rest, Code Status: full code Family Communication: pending Disposition: ICU   Patient critically ill due to shock, respiratory failure Interventions to address this today vent and pressor titration Risk of deterioration without these interventions is high  I personally spent 43 minutes providing critical care not including any separately billable procedures  Myrla Halsted MD [facility]  Prefer epic messenger for cross cover needs If after hours, please call E-link

## 2021-04-06 NOTE — Progress Notes (Signed)
[facility] ADULT ICU REPLACEMENT PROTOCOL   The patient does apply for the [facility] Adult ICU Electrolyte Replacment Protocol based on the criteria listed below:   1.Exclusion criteria: TCTS patients, ECMO patients, and Dialysis patients 2. Is GFR >/= 30 ml/min? Yes.    Patient's GFR today is >60 3. Is SCr </= 2? Yes.   Patient's SCr is 0.64 mg/dL 4. Did SCr increase >/= 0.5 in 24 hours? No. 5.Pt's weight >40kg  Yes.   6. Abnormal electrolyte(s):  K 2.6, Phos 1.1, Mg 1.9  7. Electrolytes replaced per protocol 8.  Call MD STAT for K+ </= 2.5, Phos </= 1, or Mag </= 1 Physician:  S. Bobbye Morton R Boden Stucky 04/06/2021 6:12 AM

## 2021-04-06 NOTE — Progress Notes (Signed)
Lower extremity venous study completed.  Preliminary results relayed to Katrinka Blazing, MD.  See CV Proc for preliminary results report.   Jean Rosenthal, RDMS, RVT

## 2021-04-06 NOTE — Progress Notes (Addendum)
CXR, ABG reviewed. Pushing more bicarb. Asking wife to come in to discuss GOC.  13:00 family meeting held Discussed progressive decline and salvage trial of proning Relayed grim prognosis but they would like continued trial of aggressive care Will attempt proning, family understands he may code with this given hemodynamic instabillity.  Discussed with bedside RN.  Additional 60 min cc time Myrla Halsted MD PCCM

## 2021-04-07 ENCOUNTER — Inpatient Hospital Stay (HOSPITAL_COMMUNITY): Payer: Medicaid Other

## 2021-04-07 ENCOUNTER — Encounter (HOSPITAL_COMMUNITY): Payer: Self-pay | Admitting: Pulmonary Disease

## 2021-04-07 DIAGNOSIS — I4901 Ventricular fibrillation: Secondary | ICD-10-CM

## 2021-04-07 DIAGNOSIS — U071 COVID-19: Secondary | ICD-10-CM

## 2021-04-07 DIAGNOSIS — G9341 Metabolic encephalopathy: Secondary | ICD-10-CM

## 2021-04-07 DIAGNOSIS — R6521 Severe sepsis with septic shock: Secondary | ICD-10-CM

## 2021-04-07 DIAGNOSIS — J9601 Acute respiratory failure with hypoxia: Secondary | ICD-10-CM | POA: Diagnosis not present

## 2021-04-07 DIAGNOSIS — D61818 Other pancytopenia: Secondary | ICD-10-CM

## 2021-04-07 DIAGNOSIS — I469 Cardiac arrest, cause unspecified: Secondary | ICD-10-CM

## 2021-04-07 DIAGNOSIS — A419 Sepsis, unspecified organism: Secondary | ICD-10-CM

## 2021-04-07 DIAGNOSIS — J1282 Pneumonia due to coronavirus disease 2019: Secondary | ICD-10-CM

## 2021-04-07 DIAGNOSIS — E162 Hypoglycemia, unspecified: Secondary | ICD-10-CM

## 2021-04-07 DIAGNOSIS — R579 Shock, unspecified: Secondary | ICD-10-CM

## 2021-04-07 LAB — BASIC METABOLIC PANEL
Anion gap: 10 (ref 5–15)
Anion gap: 9 (ref 5–15)
Anion gap: 9 (ref 5–15)
BUN: 25 mg/dL — ABNORMAL HIGH (ref 6–20)
BUN: 25 mg/dL — ABNORMAL HIGH (ref 6–20)
BUN: 29 mg/dL — ABNORMAL HIGH (ref 6–20)
CO2: 25 mmol/L (ref 22–32)
CO2: 27 mmol/L (ref 22–32)
CO2: 27 mmol/L (ref 22–32)
Calcium: 7 mg/dL — ABNORMAL LOW (ref 8.9–10.3)
Calcium: 7.1 mg/dL — ABNORMAL LOW (ref 8.9–10.3)
Calcium: 7.3 mg/dL — ABNORMAL LOW (ref 8.9–10.3)
Chloride: 102 mmol/L (ref 98–111)
Chloride: 103 mmol/L (ref 98–111)
Chloride: 106 mmol/L (ref 98–111)
Creatinine, Ser: 0.71 mg/dL (ref 0.61–1.24)
Creatinine, Ser: 0.77 mg/dL (ref 0.61–1.24)
Creatinine, Ser: 0.77 mg/dL (ref 0.61–1.24)
GFR, Estimated: >60 mL/min (ref >60)
GFR, Estimated: >60 mL/min (ref >60)
GFR, Estimated: >60 mL/min (ref >60)
Glucose, Bld: 83 mg/dL (ref 70–99)
Glucose, Bld: 94 mg/dL (ref 70–99)
Glucose, Bld: 98 mg/dL (ref 70–99)
Potassium: 3.4 mmol/L — ABNORMAL LOW (ref 3.5–5.1)
Potassium: 3.6 mmol/L (ref 3.5–5.1)
Potassium: 3.8 mmol/L (ref 3.5–5.1)
Sodium: 139 mmol/L (ref 135–145)
Sodium: 139 mmol/L (ref 135–145)
Sodium: 140 mmol/L (ref 135–145)

## 2021-04-07 LAB — URINE CULTURE: Culture: NO GROWTH

## 2021-04-07 LAB — TRIGLYCERIDES: Triglycerides: 714 mg/dL — ABNORMAL HIGH (ref <150)

## 2021-04-07 LAB — POCT I-STAT 7, (LYTES, BLD GAS, ICA,H+H)
Acid-base deficit: 8 mmol/L — ABNORMAL HIGH (ref 0.0–2.0)
Bicarbonate: 19.8 mmol/L — ABNORMAL LOW (ref 20.0–28.0)
Calcium, Ion: 1.14 mmol/L — ABNORMAL LOW (ref 1.15–1.40)
HCT: 25 % — ABNORMAL LOW (ref 39.0–52.0)
Hemoglobin: 8.5 g/dL — ABNORMAL LOW (ref 13.0–17.0)
O2 Saturation: 76 %
Patient temperature: 98.1
Potassium: 2.8 mmol/L — ABNORMAL LOW (ref 3.5–5.1)
Sodium: 143 mmol/L (ref 135–145)
TCO2: 21 mmol/L — ABNORMAL LOW (ref 22–32)
pCO2 arterial: 50.9 mmHg — ABNORMAL HIGH (ref 32.0–48.0)
pH, Arterial: 7.195 — CL (ref 7.350–7.450)
pO2, Arterial: 49 mmHg — ABNORMAL LOW (ref 83.0–108.0)

## 2021-04-07 LAB — CULTURE, BLOOD (ROUTINE X 2)
Culture: NO GROWTH
Culture: NO GROWTH

## 2021-04-07 LAB — CBC WITH DIFFERENTIAL/PLATELET
Abs Immature Granulocytes: 0.03 10*3/uL (ref 0.00–0.07)
Basophils Absolute: 0.1 10*3/uL (ref 0.0–0.1)
Basophils Relative: 2 %
Eosinophils Absolute: 0 10*3/uL (ref 0.0–0.5)
Eosinophils Relative: 0 %
HCT: 28.7 % — ABNORMAL LOW (ref 39.0–52.0)
Hemoglobin: 9.6 g/dL — ABNORMAL LOW (ref 13.0–17.0)
Immature Granulocytes: 1 %
Lymphocytes Relative: 5 %
Lymphs Abs: 0.2 10*3/uL — ABNORMAL LOW (ref 0.7–4.0)
MCH: 27.3 pg (ref 26.0–34.0)
MCHC: 33.4 g/dL (ref 30.0–36.0)
MCV: 81.5 fL (ref 80.0–100.0)
Monocytes Absolute: 0.1 10*3/uL (ref 0.1–1.0)
Monocytes Relative: 2 %
Neutro Abs: 3.4 10*3/uL (ref 1.7–7.7)
Neutrophils Relative %: 90 %
Platelets: 86 10*3/uL — ABNORMAL LOW (ref 150–400)
RBC: 3.52 MIL/uL — ABNORMAL LOW (ref 4.22–5.81)
RDW: 16.4 % — ABNORMAL HIGH (ref 11.5–15.5)
Smear Review: DECREASED
WBC: 3.7 10*3/uL — ABNORMAL LOW (ref 4.0–10.5)
nRBC: 0.8 % — ABNORMAL HIGH (ref 0.0–0.2)

## 2021-04-07 LAB — PHOSPHORUS
Phosphorus: 3.7 mg/dL (ref 2.5–4.6)
Phosphorus: 4 mg/dL (ref 2.5–4.6)

## 2021-04-07 LAB — MAGNESIUM: Magnesium: 1.8 mg/dL (ref 1.7–2.4)

## 2021-04-07 LAB — VANCOMYCIN, TROUGH: Vancomycin Tr: 28 ug/mL (ref 15–20)

## 2021-04-07 LAB — GLUCOSE, CAPILLARY
Glucose-Capillary: 68 mg/dL — ABNORMAL LOW (ref 70–99)
Glucose-Capillary: 90 mg/dL (ref 70–99)
Glucose-Capillary: 74 mg/dL (ref 70–99)
Glucose-Capillary: 98 mg/dL (ref 70–99)

## 2021-04-07 LAB — D-DIMER, QUANTITATIVE: D-Dimer, Quant: 5.7 ug/mL-FEU — ABNORMAL HIGH (ref 0.00–0.50)

## 2021-04-07 LAB — FERRITIN: Ferritin: 926 ng/mL — ABNORMAL HIGH (ref 24–336)

## 2021-04-07 LAB — C-REACTIVE PROTEIN: CRP: 33 mg/dL — ABNORMAL HIGH (ref <1.0)

## 2021-04-07 IMAGING — DX DG CHEST 1V
1 series · 1 of 1 positions shown · non-contrast
Comparison: [DATE]

CLINICAL DATA: 54-year-old male with history of pneumonia.

EXAM:
CHEST  1 VIEW

[chest]
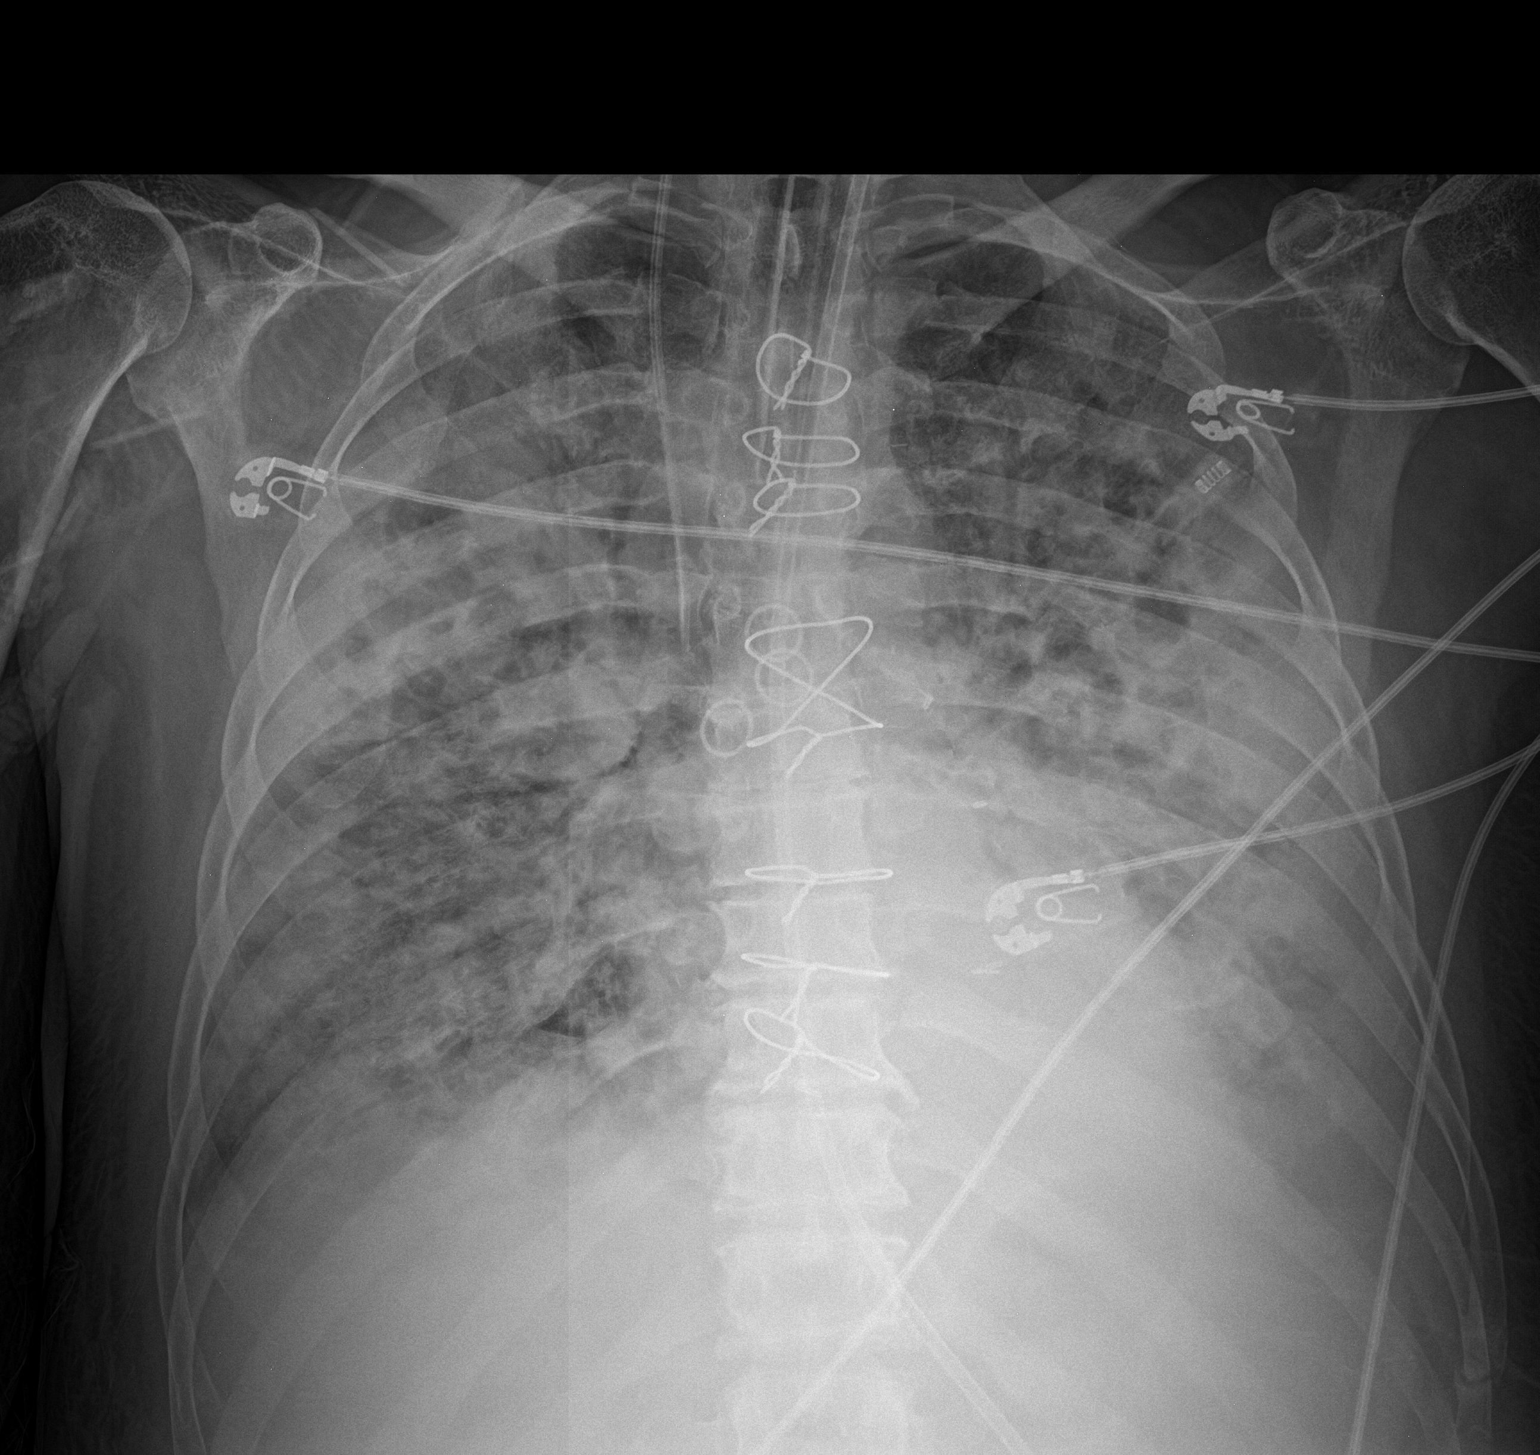

[1 of 1 positions shown; findings below may reference images not displayed]

FINDINGS: Endotracheal tube remains in place with the tip in the distal
thoracic trachea. Right internal jugular central line remains in
place with the tip in the superior vena cava. Enteric feeding tube
in place terminating off the inferior aspect of this image.
Postsurgical changes after coronary artery bypass graft. The
cardiomediastinal silhouette remains partially obscured, unchanged.
Slight interval improved aeration of the lungs bilaterally, most
notably about the right upper lobe. Similar appearing patchy,
confluent bilateral pulmonary opacities. Likely trace bilateral
pleural effusions. No acute osseous abnormality.
IMPRESSION: Slight interval improved aeration of the lungs bilaterally,
specifically in the right upper lobe. Persistent patchy bilateral
and diffuse pulmonary opacities with likely trace bilateral pleural
effusions. Stable and adequately positioned lines and tubes.

## 2021-04-07 MED ORDER — DEXTROSE 10 % IV SOLN: INTRAVENOUS | Status: DC

## 2021-04-07 MED ORDER — AMIODARONE HCL IN DEXTROSE 360-4.14 MG/200ML-% IV SOLN
60.0000 mg/h | INTRAVENOUS | Status: DC
  Administered 2021-04-07: 60 mg/h via INTRAVENOUS
  Filled 2021-04-07: qty 200

## 2021-04-07 MED ORDER — MIDAZOLAM BOLUS VIA INFUSION
0.0000 mg | INTRAVENOUS | Status: DC | PRN
  Administered 2021-04-07: 1 mg via INTRAVENOUS
  Filled 2021-04-07: qty 5

## 2021-04-07 MED ORDER — POTASSIUM CHLORIDE 10 MEQ/50ML IV SOLN
10.0000 meq | INTRAVENOUS | Status: AC
Start: 2021-04-07 — End: 2021-04-07
  Administered 2021-04-07 (×6): 10 meq via INTRAVENOUS
  Filled 2021-04-07 (×5): qty 50

## 2021-04-07 MED ORDER — DEXTROSE 50 % IV SOLN
INTRAVENOUS | Status: AC
  Filled 2021-04-07: qty 50

## 2021-04-07 MED ORDER — ENOXAPARIN SODIUM 40 MG/0.4ML IJ SOSY
40.0000 mg | PREFILLED_SYRINGE | Freq: Two times a day (BID) | INTRAMUSCULAR | Status: DC
  Administered 2021-04-07: 40 mg via SUBCUTANEOUS
  Filled 2021-04-07: qty 0.4

## 2021-04-07 MED ORDER — DEXTROSE 50 % IV SOLN
12.5000 g | INTRAVENOUS | Status: AC
  Administered 2021-04-07: 12.5 g via INTRAVENOUS

## 2021-04-07 MED ORDER — MIDAZOLAM-SODIUM CHLORIDE 100-0.9 MG/100ML-% IV SOLN
0.0000 mg/h | INTRAVENOUS | Status: DC
  Administered 2021-04-07: 2 mg/h via INTRAVENOUS
  Filled 2021-04-07: qty 100

## 2021-04-08 ENCOUNTER — Encounter: Payer: Medicaid Other | Admitting: Acute Care

## 2021-04-09 ENCOUNTER — Encounter (HOSPITAL_BASED_OUTPATIENT_CLINIC_OR_DEPARTMENT_OTHER): Payer: Medicaid Other | Admitting: Internal Medicine

## 2021-04-09 ENCOUNTER — Ambulatory Visit: Payer: Medicaid Other

## 2021-04-09 NOTE — Progress Notes (Signed)
WYATT, THORSTENSON ([id number]) Visit Report for 03/29/2021 Arrival Information Details Patient Name: Date of Service: Townsend Roger 03/29/2021 10:30 A M Medical Record Number: [id number] Patient Account Number: [id number] Date of Birth/Sex: Treating RN: 1965-06-22 (55 y.o. Damaris Schooner Primary Care Leven Hoel: Claybon Jabs Other Clinician: Referring Antwanette Wesche: Treating Detric Scalisi/Extender: Marlou Starks in Treatment: 1 Visit Information History Since Last Visit Added or deleted any medications: No Patient Arrived: Dan Humphreys Any new allergies or adverse reactions: No Arrival Time: 10:46 Had a fall or experienced change in No Accompanied By: wife activities of daily living that may affect Transfer Assistance: Manual risk of falls: Patient Identification Verified: Yes Signs or symptoms of abuse/neglect since last visito No Secondary Verification Process Completed: Yes Hospitalized since last visit: No Patient Requires Transmission-Based Precautions: No Implantable device outside of the clinic excluding No Patient Has Alerts: Yes cellular tissue based products placed in the center Patient Alerts: Patient on Blood Thinner since last visit: ABI's: R=1.23 L=0.97 Has Dressing in Place as Prescribed: Yes TBI's R=0.63 L=1.04 Pain Present Now: Yes Electronic Signature(s) Signed: 04/09/2021 2:37:05 PM By: Karl Ito Entered By: Karl Ito on 03/29/2021 10:46:26 -------------------------------------------------------------------------------- Encounter Discharge Information Details Patient Name: Date of Service: [name], [name] 03/29/2021 10:30 A M Medical Record Number: [id number] Patient Account Number: [id number] Date of Birth/Sex: Treating RN: May 10, 1966 (55 y.o. Lytle Michaels Primary Care Coe Angelos: Claybon Jabs Other Clinician: Referring Jalon Blackwelder: Treating Rasul Decola/Extender: Marlou Starks in Treatment: 1 Encounter  Discharge Information Items Post Procedure Vitals Discharge Condition: Stable Temperature (F): 97.7 Ambulatory Status: Walker Pulse (bpm): 92 Discharge Destination: Home Respiratory Rate (breaths/min): 16 Transportation: Private Auto Blood Pressure (mmHg): 97/65 Accompanied By: spouse Schedule Follow-up Appointment: Yes Clinical Summary of Care: Provided on 03/29/2021 Form Type Recipient Paper Patient Patient Electronic Signature(s) Signed: 03/29/2021 12:50:57 PM By: Antonieta Iba Entered By: Antonieta Iba on 03/29/2021 11:58:27 -------------------------------------------------------------------------------- Lower Extremity Assessment Details Patient Name: Date of Service: [name] 03/29/2021 10:30 A M Medical Record Number: [id number] Patient Account Number: [id number] Date of Birth/Sex: Treating RN: Sep 27, 1965 (55 y.o. Lytle Michaels Primary Care Tamirra Sienkiewicz: Claybon Jabs Other Clinician: Referring Colston Pyle: Treating Payden Docter/Extender: Marlou Starks in Treatment: 1 Edema Assessment Assessed: Kyra Searles: No] Franne Forts: Yes] Edema: [Left: Ye] [Right: s] Calf Left: Right: Point of Measurement: 30 cm From Medial Instep 26 cm Ankle Left: Right: Point of Measurement: 9 cm From Medial Instep 19.5 cm Vascular Assessment Pulses: Dorsalis Pedis Palpable: [Right:Yes] Electronic Signature(s) Signed: 03/29/2021 12:50:57 PM By: Antonieta Iba Entered By: Antonieta Iba on 03/29/2021 11:09:01 -------------------------------------------------------------------------------- Multi Wound Chart Details Patient Name: Date of Service: [name] 03/29/2021 10:30 A M Medical Record Number: [id number] Patient Account Number: [id number] Date of Birth/Sex: Treating RN: 03-Oct-1965 (55 y.o. Damaris Schooner Primary Care Kerisha Goughnour: Claybon Jabs Other Clinician: Referring Dlisa Barnwell: Treating Shane Badeaux/Extender: Marlou Starks in  Treatment: 1 Vital Signs Height(in): 67 Capillary Blood Glucose(mg/dl): 938 Weight(lbs): 101 Pulse(bpm): 92 Body Mass Index(BMI): 20 Blood Pressure(mmHg): 97/65 Temperature(F): 97.7 Respiratory Rate(breaths/min): 16 Photos: [1:Right Amputation Site - Toe] [2:Right, Lateral Foot] [N/A:N/A N/A] Wound Location: [1:Surgical Injury] [2:Surgical Injury] [N/A:N/A] Wounding Event: [1:Dehisced Wound] [2:Dehisced Wound] [N/A:N/A] Primary Etiology: [1:Coronary Artery Disease,] [2:Coronary Artery Disease,] [N/A:N/A] Comorbid History: [1:Hypertension, Type II Diabetes, Osteoarthritis, Neuropathy 02/06/2021] [2:Hypertension, Type II Diabetes, Osteoarthritis, Neuropathy 02/06/2021] [N/A:N/A] Date Acquired: [1:1] [2:1] [N/A:N/A] Weeks of Treatment: [1:Open] [2:Open] [N/A:N/A] Wound Status: [1:Yes] [2:Yes] [N/A:N/A] Pending A mputation on Presentation: [  1:4.5x1.5x0.9] [2:0.9x1.2x0.3] [N/A:N/A] Measurements L x W x D (cm) [1:5.301] [2:0.848] [N/A:N/A] A (cm) : rea [1:4.771] [2:0.254] [N/A:N/A] Volume (cm) : [1:6.30%] [2:25.00%] [N/A:N/A] % Reduction in A rea: [1:6.20%] [2:55.00%] [N/A:N/A] % Reduction in Volume: [1:Full Thickness Without Exposed] [2:Full Thickness Without Exposed] [N/A:N/A] Classification: [1:Support Structures Medium] [2:Support Structures Medium] [N/A:N/A] Exudate A mount: [1:Purulent] [2:Purulent] [N/A:N/A] Exudate Type: [1:yellow, brown, green] [2:yellow, brown, green] [N/A:N/A] Exudate Color: [1:Distinct, outline attached] [2:Distinct, outline attached] [N/A:N/A] Wound Margin: [1:Small (1-33%)] [2:None Present (0%)] [N/A:N/A] Granulation A mount: [1:Red] [2:N/A] [N/A:N/A] Granulation Quality: [1:Large (67-100%)] [2:Large (67-100%)] [N/A:N/A] Necrotic A mount: [1:Eschar, Adherent Slough] [2:Eschar, Adherent Slough] [N/A:N/A] Necrotic Tissue: [1:Fat Layer (Subcutaneous Tissue): Yes Fat Layer (Subcutaneous Tissue): Yes N/A] Exposed Structures: [1:Fascia: No Tendon: No Muscle:  No Joint: No Bone: No None] [2:Fascia: No Tendon: No Muscle: No Joint: No Bone: No None] [N/A:N/A] Epithelialization: [1:Debridement - Excisional] [2:Debridement - Excisional] [N/A:N/A] Debridement: Pre-procedure Verification/Time Out 11:19 [2:11:19] [N/A:N/A] Taken: [1:Other] [2:Other] [N/A:N/A] Pain Control: [1:Necrotic/Eschar, Subcutaneous,] [2:Necrotic/Eschar, Subcutaneous,] [N/A:N/A] Tissue Debrided: [1:Slough Skin/Subcutaneous Tissue] [2:Slough Skin/Subcutaneous Tissue] [N/A:N/A] Level: [1:6.75] [2:1.08] [N/A:N/A] Debridement A (sq cm): [1:rea Curette] [2:Curette] [N/A:N/A] Instrument: [1:Minimum] [2:Minimum] [N/A:N/A] Bleeding: [1:Pressure] [2:Pressure] [N/A:N/A] Hemostasis Achieved: Debridement Treatment Response: Procedure was tolerated well [2:Procedure was tolerated well] [N/A:N/A] Post Debridement Measurements L x 4.5x1.5x0.9 [2:0.9x1.2x0.3] [N/A:N/A] W x D (cm) [1:4.771] [2:0.254] [N/A:N/A] Post Debridement Volume: (cm) [1:Debridement] [2:Debridement] [N/A:N/A] Treatment Notes Electronic Signature(s) Signed: 03/29/2021 12:39:46 PM By: Baltazar Najjar MD Signed: 04/01/2021 5:48:05 PM By: Zenaida Deed RN, BSN Entered By: Baltazar Najjar on 03/29/2021 11:55:10 -------------------------------------------------------------------------------- Multi-Disciplinary Care Plan Details Patient Name: Date of Service: [name], [place] BBY 03/29/2021 10:30 A M Medical Record Number: [id number] Patient Account Number: [id number] Date of Birth/Sex: Treating RN: December 15, 1965 (55 y.o. Lytle Michaels Primary Care Crissy Mccreadie: Claybon Jabs Other Clinician: Referring Latonia Conrow: Treating Schwanda Zima/Extender: Marlou Starks in Treatment: 1 Active Inactive Abuse / Safety / Falls / Self Care Management Nursing Diagnoses: History of Falls Goals: Patient will remain injury free related to falls Date Initiated: 03/21/2021 Target Resolution Date: 04/17/2021 Goal Status:  Active Interventions: Assess fall risk on admission and as needed Assess impairment of mobility on admission and as needed per policy Notes: Nutrition Nursing Diagnoses: Impaired glucose control: actual or potential Goals: Patient/caregiver verbalizes understanding of need to maintain therapeutic glucose control per primary care physician Date Initiated: 03/21/2021 Target Resolution Date: 04/17/2021 Goal Status: Active Interventions: Assess HgA1c results as ordered upon admission and as needed Notes: Wound/Skin Impairment Nursing Diagnoses: Impaired tissue integrity Goals: Patient/caregiver will verbalize understanding of skin care regimen Date Initiated: 03/21/2021 Target Resolution Date: 04/17/2021 Goal Status: Active Ulcer/skin breakdown will have a volume reduction of 30% by week 4 Date Initiated: 03/21/2021 Target Resolution Date: 04/17/2021 Goal Status: Active Interventions: Assess patient/caregiver ability to obtain necessary supplies Assess patient/caregiver ability to perform ulcer/skin care regimen upon admission and as needed Assess ulceration(s) every visit Provide education on ulcer and skin care Treatment Activities: Topical wound management initiated : 03/21/2021 Notes: Electronic Signature(s) Signed: 03/29/2021 12:50:57 PM By: Antonieta Iba Entered By: Antonieta Iba on 03/29/2021 11:11:20 -------------------------------------------------------------------------------- Pain Assessment Details Patient Name: Date of Service: [name] 03/29/2021 10:30 A M Medical Record Number: [id number] Patient Account Number: [id number] Date of Birth/Sex: Treating RN: 10/02/1965 (55 y.o. Damaris Schooner Primary Care Kaine Mcquillen: Claybon Jabs Other Clinician: Referring Darolyn Double: Treating Dekisha Mesmer/Extender: Marlou Starks in Treatment: 1 Active Problems Location of Pain Severity and Description of Pain Patient  Has Paino Yes Site  Locations Rate the pain. Current Pain Level: 8 Pain Management and Medication Current Pain Management: Electronic Signature(s) Signed: 04/01/2021 5:48:05 PM By: Zenaida Deed RN, BSN Signed: 04/09/2021 2:37:05 PM By: Karl Ito Entered By: Karl Ito on 03/29/2021 10:47:41 -------------------------------------------------------------------------------- Patient/Caregiver Education Details Patient Name: Date of Service: [name] 10/14/2022andnbsp10:30 A M Medical Record Number: [id number] Patient Account Number: [id number] Date of Birth/Gender: Treating RN: Dec 01, 1965 (55 y.o. Lytle Michaels Primary Care Physician: Claybon Jabs Other Clinician: Referring Physician: Treating Physician/Extender: Marlou Starks in Treatment: 1 Education Assessment Education Provided To: Patient Education Topics Provided Offloading: Methods: Explain/Verbal Responses: State content correctly Wound Debridement: Methods: Explain/Verbal Responses: State content correctly Wound/Skin Impairment: Methods: Demonstration, Explain/Verbal, Printed Responses: State content correctly Electronic Signature(s) Signed: 03/29/2021 12:50:57 PM By: Antonieta Iba Entered By: Antonieta Iba on 03/29/2021 11:11:55 -------------------------------------------------------------------------------- Wound Assessment Details Patient Name: Date of Service: [name] 03/29/2021 10:30 A M Medical Record Number: [id number] Patient Account Number: [id number] Date of Birth/Sex: Treating RN: [birth year] (55 y.o. 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 Wound Open Wounding Event: Surgical Injury Status: Date Acquired:  02/06/2021 Comorbid Coronary Artery Disease, Hypertension, Type II Diabetes, Weeks Of Treatment: 1 History: Osteoarthritis, Neuropathy Clustered Wound: No Pending Amputation On Presentation Photos Wound Measurements Length: (cm) 4.5 Width: (cm) 1.5 Depth: (cm) 0.9 Area: (cm) 5.301 Volume: (cm) 4.771 % Reduction in Area: 6.3% % Reduction in Volume: 6.2% Epithelialization: None Tunneling: No Undermining: No Wound Description Classification: Full Thickness Without Exposed Support Structures Wound Margin: Distinct, outline attached Exudate Amount: Medium Exudate Type: Purulent Exudate Color: yellow, brown, green Foul Odor After Cleansing: No Slough/Fibrino Yes Wound Bed Granulation Amount: Small (1-33%) Exposed Structure Granulation Quality: Red Fascia Exposed: No Necrotic Amount: Large (67-100%) Fat Layer (Subcutaneous Tissue) Exposed: Yes Necrotic Quality: Eschar, Adherent Slough Tendon Exposed: No Muscle Exposed: No Joint Exposed: No Bone Exposed: No Treatment Notes Wound #1 (Amputation Site - Toe) Wound Laterality: Right Cleanser Soap and Water Discharge Instruction: May shower and wash wound with dial antibacterial soap and water prior to dressing change. Peri-Wound Care Topical Primary Dressing KerraCel Ag Gelling Fiber Dressing, 4x5 in (silver alginate) Discharge Instruction: Apply silver alginate to wound bed as instructed Santyl Ointment Discharge Instruction: Apply nickel thick amount to wound bed as instructed Secondary Dressing Woven Gauze Sponge, Non-Sterile 4x4 in Discharge Instruction: Apply over primary dressing as directed. ABD Pad, 5x9 Discharge Instruction: Apply over primary dressing as directed. Secured With Elastic Bandage 4 inch (ACE bandage) Discharge Instruction: Secure with ACE bandage as directed. Kerlix Roll Sterile, 4.5x3.1 (in/yd) Discharge Instruction: Secure with Kerlix as directed. Transpore Surgical Tape, 2x10  (in/yd) Discharge Instruction: Secure dressing with tape as directed. Compression Wrap Compression Stockings Add-Ons Electronic Signature(s) Signed: 03/29/2021 12:50:57 PM By: Antonieta Iba Signed: 04/01/2021 5:48:05 PM By: Zenaida Deed RN, BSN Entered By: Antonieta Iba on 03/29/2021 11:09:41 -------------------------------------------------------------------------------- Wound Assessment Details Patient Name: Date of Service: [name], [name] 03/29/2021 10:30 A M Medical Record Number: [id number] Patient Account Number: [id number] Date of Birth/Sex: Treating RN: 05/21/1966 (55 y.o. Damaris Schooner Primary Care Shivangi Lutz: Claybon Jabs Other Clinician: Referring Joyanne Eddinger: Treating Jamaira Sherk/Extender: Marlou Starks in Treatment: 1 Wound Status Wound Number: 2 Primary Dehisced Wound Etiology: Wound Location: Right, Lateral Foot  Wound Open Wounding Event: Surgical Injury Status: Date Acquired: 02/06/2021 Comorbid Coronary Artery Disease, Hypertension, Type II Diabetes, Weeks Of Treatment: 1 History: Osteoarthritis, Neuropathy Clustered Wound: No Pending Amputation On Presentation Photos Wound Measurements Length: (cm) 0.9 Width: (cm) 1.2 Depth: (cm) 0.3 Area: (cm) 0.848 Volume: (cm) 0.254 % Reduction in Area: 25% % Reduction in Volume: 55% Epithelialization: None Tunneling: No Undermining: No Wound Description Classification: Full Thickness Without Exposed Support Structu Wound Margin: Distinct, outline attached Exudate Amount: Medium Exudate Type: Purulent Exudate Color: yellow, brown, green res Foul Odor After Cleansing: No Slough/Fibrino Yes Wound Bed Granulation Amount: None Present (0%) Exposed Structure Necrotic Amount: Large (67-100%) Fascia Exposed: No Necrotic Quality: Eschar, Adherent Slough Fat Layer (Subcutaneous Tissue) Exposed: Yes Tendon Exposed: No Muscle Exposed: No Joint Exposed: No Bone Exposed:  No Treatment Notes Wound #2 (Foot) Wound Laterality: Right, Lateral Cleanser Soap and Water Discharge Instruction: May shower and wash wound with dial antibacterial soap and water prior to dressing change. Peri-Wound Care Topical Primary Dressing KerraCel Ag Gelling Fiber Dressing, 2x2 in (silver alginate) Discharge Instruction: Apply silver alginate to wound bed as instructed Santyl Ointment Discharge Instruction: Apply nickel thick amount to wound bed as instructed Secondary Dressing Woven Gauze Sponge, Non-Sterile 4x4 in Discharge Instruction: Apply over primary dressing as directed. ABD Pad, 5x9 Discharge Instruction: Apply over primary dressing as directed. Secured With Elastic Bandage 4 inch (ACE bandage) Discharge Instruction: Secure with ACE bandage as directed. Kerlix Roll Sterile, 4.5x3.1 (in/yd) Discharge Instruction: Secure with Kerlix as directed. Transpore Surgical Tape, 2x10 (in/yd) Discharge Instruction: Secure dressing with tape as directed. Compression Wrap Compression Stockings Add-Ons Electronic Signature(s) Signed: 03/29/2021 12:50:57 PM By: Antonieta Iba Signed: 04/01/2021 5:48:05 PM By: Zenaida Deed RN, BSN Entered By: Antonieta Iba on 03/29/2021 11:10:31 -------------------------------------------------------------------------------- Vitals Details Patient Name: Date of Service: [name], [name] 03/29/2021 10:30 A M Medical Record Number: [id number] Patient Account Number: [id number] Date of Birth/Sex: Treating RN: Jun 09, 1966 (55 y.o. Damaris Schooner Primary Care Ashla Murph: Claybon Jabs Other Clinician: Referring Sharolyn Weber: Treating Sebastian Dzik/Extender: Marlou Starks in Treatment: 1 Vital Signs Time Taken: 10:46 Temperature (F): 97.7 Height (in): 67 Pulse (bpm): 92 Weight (lbs): 128 Respiratory Rate (breaths/min): 16 Body Mass Index (BMI): 20 Blood Pressure (mmHg): 97/65 Capillary Blood Glucose (mg/dl):  111 Reference Range: 80 - 120 mg / dl Electronic Signature(s) Signed: 04/09/2021 2:37:05 PM By: Karl Ito Entered By: Karl Ito on 03/29/2021 10:47:02

## 2021-04-11 ENCOUNTER — Encounter (HOSPITAL_BASED_OUTPATIENT_CLINIC_OR_DEPARTMENT_OTHER): Payer: Medicaid Other | Admitting: Internal Medicine

## 2021-04-12 ENCOUNTER — Encounter: Payer: Medicaid Other | Admitting: Podiatry

## 2021-04-16 NOTE — Progress Notes (Signed)
   04/12/2021 1430  Clinical Encounter Type  Visited With Patient and family together  Visit Type Initial;Social support;Code;Death  Referral From Nurse  Consult/Referral To Chaplain  Spiritual Encounters  Spiritual Needs Grief support   Chaplain responded to Code Blue. Pt's sister, wife and son were present. Chaplain provided emotional and physical support while engaging active listening. Chaplain provided Patient Placement card and explained next steps. Pt's wife hasn't chosen a funeral home at this time. No further spiritual care needs at this time. Chaplain remains available if that changes.  This note was prepared by Paul Half, MDiv. Chaplain remains available as needed through the on-call pager: [phone].

## 2021-04-16 NOTE — Progress Notes (Signed)
Called wife and updated. Continue full scope, pray for miracle.  Myrla Halsted MD PCCM

## 2021-04-16 NOTE — Progress Notes (Signed)
PCCM Prog Note  PCCM called to bedside for SVT/AFRVR to the 200s. Currently on max levophed pressor support. Adenosine was given with improvement to 150s. Amiodarone bolus given. HR improved to 130s. Remains critically ill with high likelihood of in-hospital death. Updated patient family (father). Wife is HCPOA. Continue full code.  Care Time: 64 min  Mechele Collin, M.D. [facility] Pulmonary/Critical Care Medicine 03/24/2021 1:16 PM   See Amion for personal pager For hours between 7 PM to 7 AM, please call Elink for urgent questions

## 2021-04-16 NOTE — Death Summary Note (Signed)
DEATH SUMMARY   Patient Details  Name: [name] MRN: [id number] DOB: 07/16/1965  Admission/Discharge Information   Admit Date:  04-03-2021  Date of Death: Date of Death: 2021-04-08  Time of Death: Time of Death: 1434  Length of Stay: 5  Referring Physician: Joice Lofts, NP   Reason(s) for Hospitalization  Septic shock and ARDS/acute hypoxemic respiratory failure secondary to COVID-19 pneumonia  Diagnoses  Preliminary cause of death: Ventricular fibrillation secondary acute hypoxemic respiratory failure secondary to COVID-19 pneumonia Secondary Diagnoses (including complications and co-morbidities):  Active Problems:   Septic shock (HCC)   Pneumonia   Acute hypoxemic respiratory failure due to COVID-19 [facility])   Ventricular fibrillation (HCC)   Cardiac arrest, cause unspecified (HCC)   Acute metabolic encephalopathy   Pancytopenia [facility])   Brief Hospital Course (including significant findings, care, treatment, and services provided and events leading to death)  [name] is a 55 y.o. year old male who with hyperlipidemia, hypertension, osteomyelitis, CAD status post CABG x4 vessels, recent partial fourth and fifth ray amputation admitted for acute hypoxemic respiratory failure/ARDS and septic shock secondary to COVID-19 pneumonia. He required intubation on 04/03/21. He was treated with antibiotics, IVFs, vasopressor support and steroids. In addition to his COVID-19 infection, concerned for right lower extremity wound contributing to sepsis. Failed proning trial with worsening hypoxemia with inability to recruit so he was returned to supine position. On Apr 08, 2021 patient went in ventricular fibrillation. CPR initiated and patient was shocked and given epinephrine. He expired on 08-Apr-2021 at 14:34  Pertinent Labs and Studies  Significant Diagnostic Studies DG Chest 1 View  Result Date: 04/08/2021 CLINICAL DATA:  55 year old male with history of pneumonia. EXAM: CHEST  1  VIEW COMPARISON:  04/06/2021 FINDINGS: Endotracheal tube remains in place with the tip in the distal thoracic trachea. Right internal jugular central line remains in place with the tip in the superior vena cava. Enteric feeding tube in place terminating off the inferior aspect of this image. Postsurgical changes after coronary artery bypass graft. The cardiomediastinal silhouette remains partially obscured, unchanged. Slight interval improved aeration of the lungs bilaterally, most notably about the right upper lobe. Similar appearing patchy, confluent bilateral pulmonary opacities. Likely trace bilateral pleural effusions. No acute osseous abnormality. IMPRESSION: Slight interval improved aeration of the lungs bilaterally, specifically in the right upper lobe. Persistent patchy bilateral and diffuse pulmonary opacities with likely trace bilateral pleural effusions. Stable and adequately positioned lines and tubes. Electronically Signed   By: Marliss Coots M.D.   On: April 08, 2021 09:44   DG Chest 1 View  Result Date: 04/06/2021 CLINICAL DATA:  A 56 year old male presents for evaluation of shortness breath and diffuse pneumonia/pneumonitis. EXAM: CHEST  1 VIEW COMPARISON:  Multiple prior studies most recent April 05, 2021. FINDINGS: EKG leads project over the chest. The patient is intubated with endotracheal tube approximately 1-1.5 above the carina, this appears to been advanced since the prior radiograph of April 05, 2021. RIGHT-sided IJ central venous catheter terminates at the distal superior vena cava also unchanged with respect position. Feeding tube courses through and off the field of the radiograph.w Post median sternotomy and CABG as before. Cardiomediastinal contours are similar to previous imaging though are largely obscured due to worsening bilateral airspace disease. Worsening is seen throughout the chest most visible in the RIGHT lower lobe which was relatively spared on the previous study. On  limited assessment there is no acute skeletal process. IMPRESSION: Marked interval worsening of bilateral airspace disease most visible in  the RIGHT lower lobe. Findings compatible with multifocal infection, developing ARDS is also considered. Follow-up is suggested to ensure resolution. Endotracheal tube appears to have been advanced slightly since the prior radiograph of April 05, 2021. May be slightly low lying likely within 1 to 1.5 cm of the carina. These results will be called to the ordering clinician or representative by the Radiologist Assistant, and communication documented in the PACS or [employer]. Electronically Signed   By: Donzetta Kohut M.D.   On: 04/06/2021 09:12   DG Chest 1 View  Result Date: 2021-05-02 CLINICAL DATA:  Hypotension I95.9 (ICD-10-CM) Questionable sepsis. EXAM: CHEST  1 VIEW COMPARISON:  02/15/2021. FINDINGS: Patchy airspace opacities throughout the left lung and in the right mid to upper lung, compatible with multifocal pneumonia. No visible pneumothorax or pleural effusion. Cardiomediastinal silhouette is within normal limits. CABG and median sternotomy. No acute osseous abnormality. IMPRESSION: Patchy airspace opacities throughout the left lung and in the right mid to upper lung, compatible with multifocal pneumonia. Electronically Signed   By: Feliberto Harts M.D.   On: 02-May-2021 12:23   DG Abd 1 View  Result Date: 04/03/2021 CLINICAL DATA:  NG placement EXAM: ABDOMEN - 1 VIEW COMPARISON:  04/03/2021 FINDINGS: NG tube placed since earlier today. NG tube passes through the stomach with the tip near the pylorus. Stomach is decompressed Diffuse bilateral airspace disease left greater than right unchanged from earlier today. IMPRESSION: NG tip in the region of the gastric antrum or pylorus. Electronically Signed   By: Marlan Palau M.D.   On: 04/03/2021 17:21   CT HEAD WO CONTRAST ( )  Result Date: 05/02/2021 CLINICAL DATA:  Delirium EXAM: CT HEAD WITHOUT  CONTRAST TECHNIQUE: Contiguous axial images were obtained from the base of the skull through the vertex without intravenous contrast. COMPARISON:  Brain MRI 09/20/2020, CT head 01/30/2021 FINDINGS: Brain: There is no acute intracranial hemorrhage, extra-axial fluid collection, or acute infarct. Parenchymal volume is normal.  The ventricles are stable in size. There is no mass lesion.  There is no midline shift Vascular: No hyperdense vessel or unexpected calcification. Skull: Normal. Negative for fracture or focal lesion. Sinuses/Orbits: Imaged paranasal sinuses are clear. The globes and orbits are unremarkable. Other: None. IMPRESSION: No acute intracranial pathology. Electronically Signed   By: Lesia Hausen M.D.   On: 05/02/2021 12:30   CT Angio Chest Pulmonary Embolism (PE) W or WO Contrast  Result Date: 04/03/2021 CLINICAL DATA:  COVID positive, tachycardia, respiratory distress EXAM: CT ANGIOGRAPHY CHEST WITH CONTRAST TECHNIQUE: Multidetector CT imaging of the chest was performed using the standard protocol during bolus administration of intravenous contrast. Multiplanar CT image reconstructions and MIPs were obtained to evaluate the vascular anatomy. CONTRAST:  88mL OMNIPAQUE IOHEXOL 350 MG/ML SOLN COMPARISON:  Chest radiograph dated 05-02-2021. FINDINGS: Cardiovascular: Satisfactory opacification of the bilateral pulmonary arteries to the segmental level. No evidence of pulmonary embolism. Although not tailored for evaluation of the thoracic aorta, there is no evidence of thoracic aortic aneurysm or dissection. The heart is normal in size.  No pericardial effusion. Coronary atherosclerosis of the LAD and right coronary artery. Postsurgical changes related to prior CABG. Mediastinum/Nodes: No suspicious mediastinal lymphadenopathy. Visualized thyroid is unremarkable. Lungs/Pleura: Multifocal pneumonia in all lobes, bilateral upper lobe and left lower lobe predominant. No suspicious pulmonary nodules.  Small left pleural effusion. No pneumothorax. Upper Abdomen: Visualized upper abdomen is grossly unremarkable. Musculoskeletal: Mild degenerative changes of the visualized thoracolumbar spine. Median sternotomy. Review of the MIP images confirms the  above findings. IMPRESSION: No evidence of pulmonary embolism. Multifocal pneumonia in this patient with known COVID, left lower lobe predominant. Small left pleural effusion. Aortic Atherosclerosis (ICD10-I70.0). Electronically Signed   By: Charline Bills M.D.   On: 04/03/2021 02:46   DG CHEST PORT 1 VIEW  Result Date: 04/05/2021 CLINICAL DATA:  Multifocal infection/inflammation increasing shortness of breath, endotracheally intubated. Hypotension. EXAM: PORTABLE CHEST 1 VIEW COMPARISON:  CT angio chest 04/03/2021, chest x-ray 04/05/2021 3:25 a.m. FINDINGS: Redemonstration of a right internal jugular central venous catheter with tip overlying the expected region of the distal superior vena cava. Endotracheal tube noted mid line with tip poorly visualized but likely approximately 3 cm above the carina. Enteric tube noted coursing below the hemidiaphragm with tip collimated off view. The heart and mediastinal contours are unchanged. Cardiac surgical changes overlie the mediastinum Diffuse patchy airspace opacities are again noted with most focal consolidation within the right upper lobe. No pulmonary edema. Trace left pleural effusion. No pneumothorax. No acute osseous abnormality. IMPRESSION: 1. Multifocal infection/inflammation. Followup PA and lateral chest X-ray is recommended in 3-4 weeks following therapy to ensure resolution. 2. Trace left pleural effusion. 3. Lines and tubes as described above. Electronically Signed   By: Tish Frederickson M.D.   On: 04/05/2021 16:57   DG CHEST PORT 1 VIEW  Result Date: 04/05/2021 CLINICAL DATA:  Shortness of breath EXAM: PORTABLE CHEST 1 VIEW COMPARISON:  04/03/2021 FINDINGS: Endotracheal tube and gastric catheter are  again seen and stable. Right jugular central line is noted in the mid superior vena cava. Diffuse increased airspace opacity is again identified right greater than left. There has been some progression in the right upper lobe when compared with the prior exam. No pneumothorax is seen. IMPRESSION: Bilateral airspace opacities worse on the left than the right with some interval increase in the right upper lobe. Tubes and lines as described. Electronically Signed   By: Alcide Clever M.D.   On: 04/05/2021 03:35   DG CHEST PORT 1 VIEW  Result Date: 04/03/2021 CLINICAL DATA:  Check central line placement EXAM: PORTABLE CHEST 1 VIEW COMPARISON:  Film from earlier in the same day. FINDINGS: Cardiac shadow is stable. Postsurgical changes are again seen. Endotracheal tube and gastric catheter are noted in satisfactory position. New right jugular central line is noted in the mid superior vena cava. No pneumothorax is seen. Diffuse airspace opacities are again identified left greater than right stable in appearance from the prior exam. IMPRESSION: Tubes and lines as described above.  No pneumothorax is noted. Remainder of the exam is stable from the prior study. Electronically Signed   By: Alcide Clever M.D.   On: 04/03/2021 19:35   DG CHEST PORT 1 VIEW  Result Date: 04/03/2021 CLINICAL DATA:  COVID positive on May 01, 2021.  Respiratory distress. EXAM: PORTABLE CHEST 1 VIEW COMPARISON:  CT of the chest acquired on 04/03/2021. FINDINGS: Post median sternotomy for CABG. EKG leads projecting over the chest. Severe consolidative changes worse in the LEFT chest throughout the LEFT chest and in the RIGHT upper lobe with patchy opacities elsewhere showing a similar appearance to recent CT of the chest. Cardiomediastinal contours are stable. No visible pneumothorax. On limited assessment there is no acute skeletal process. IMPRESSION: Multifocal pneumonia with similar appearance to the chest CT acquired at 2:31 a.m.  Electronically Signed   By: Donzetta Kohut M.D.   On: 04/03/2021 12:12   DG Abd Portable 1V  Result Date: 04/05/2021 CLINICAL DATA:  Feeding tube placement.  EXAM: PORTABLE ABDOMEN - 1 VIEW COMPARISON:  None. FINDINGS: The bowel gas pattern is normal. Distal tip of feeding tube is seen in expected position of distal stomach. No radio-opaque calculi or other significant radiographic abnormality are seen. IMPRESSION: Distal tip of feeding tube seen in expected position of distal stomach. Electronically Signed   By: Lupita Raider M.D.   On: 04/05/2021 10:22   DG Foot 2 Views Right  Result Date: 04/03/2021 CLINICAL DATA:  Status post amputation of fourth and fifth toes, possible sepsis EXAM: RIGHT FOOT - 2 VIEW COMPARISON:  Foot radiographs 03/07/2021 FINDINGS: Postsurgical changes reflecting amputation of the distal portions of the fourth and fifth metatarsals and the fourth and fifth toes are again seen. There is no evidence of osseous irregularity or destruction. There is lucency within the soft tissues adjacent to the third toe MTP joint which appears similar to the prior study and may reflect overlapping soft tissues. The soft tissues are otherwise unremarkable. IMPRESSION: 1. Status post amputation of the distal portions of the fourth and fifth metatarsals and associated toes with no radiographic evidence of acute osseous abnormality or destruction. MRI could be considered for further evaluation of possible osteomyelitis as indicated. 2. Possible soft tissue gas along the lateral aspect of the third toe MTP joint. This is similar to the appearance on the prior radiograph and may reflect overlapping soft tissue; however, correlate with physical exam to assess for wound/laceration in this location. Infection with a gas-forming organism cannot be excluded radiographically. Electronically Signed   By: Lesia Hausen M.D.   On: 04/04/2021 12:26   VAS [place] LOWER EXTREMITY VENOUS (DVT)  Result Date:  04/06/2021  Lower Venous DVT Study Patient Name:  MAXIMILLIAN HABIBI  Date of Exam:   04/06/2021 Medical Rec #: [id number]       Accession #:    [id number] Date of Birth: [birth year]      Patient Gender: M Patient Age:   62 years Exam Location:  [facility] Procedure:      VAS [place] LOWER EXTREMITY VENOUS (DVT) Referring Phys: Levon Hedger --------------------------------------------------------------------------------  Indications: Acute hypoxia, Covid-19.  Comparison Study: No prior studies. Performing Technologist: Jean Rosenthal RDMS, RVT  Examination Guidelines: A complete evaluation includes B-mode imaging, spectral Doppler, color Doppler, and power Doppler as needed of all accessible portions of each vessel. Bilateral testing is considered an integral part of a complete examination. Limited examinations for reoccurring indications may be performed as noted. The reflux portion of the exam is performed with the patient in reverse Trendelenburg.  +---------+---------------+---------+-----------+----------+--------------+ RIGHT    CompressibilityPhasicitySpontaneityPropertiesThrombus Aging +---------+---------------+---------+-----------+----------+--------------+ CFV      Full           Yes      Yes                                 +---------+---------------+---------+-----------+----------+--------------+ SFJ      Full                                                        +---------+---------------+---------+-----------+----------+--------------+ FV Prox  Full                                                        +---------+---------------+---------+-----------+----------+--------------+  FV Mid   Full                                                        +---------+---------------+---------+-----------+----------+--------------+ FV DistalFull                                                         +---------+---------------+---------+-----------+----------+--------------+ PFV      Full                                                        +---------+---------------+---------+-----------+----------+--------------+ POP      Full           Yes      Yes                                 +---------+---------------+---------+-----------+----------+--------------+ PTV      Full                                                        +---------+---------------+---------+-----------+----------+--------------+ PERO     Full                                                        +---------+---------------+---------+-----------+----------+--------------+ Gastroc  Full                                                        +---------+---------------+---------+-----------+----------+--------------+   +---------+---------------+---------+-----------+----------+--------------+ LEFT     CompressibilityPhasicitySpontaneityPropertiesThrombus Aging +---------+---------------+---------+-----------+----------+--------------+ CFV      Full           Yes      Yes                                 +---------+---------------+---------+-----------+----------+--------------+ SFJ      Full                                                        +---------+---------------+---------+-----------+----------+--------------+ FV Prox  Full                                                        +---------+---------------+---------+-----------+----------+--------------+  FV Mid   Full                                                        +---------+---------------+---------+-----------+----------+--------------+ FV DistalFull                                                        +---------+---------------+---------+-----------+----------+--------------+ PFV      Full                                                         +---------+---------------+---------+-----------+----------+--------------+ POP      Full           Yes      Yes                                 +---------+---------------+---------+-----------+----------+--------------+ PTV      Full                                                        +---------+---------------+---------+-----------+----------+--------------+ PERO     Full                                                        +---------+---------------+---------+-----------+----------+--------------+ Gastroc  Full                                                        +---------+---------------+---------+-----------+----------+--------------+     Summary: RIGHT: - There is no evidence of deep vein thrombosis in the lower extremity.  - No cystic structure found in the popliteal fossa.  LEFT: - There is no evidence of deep vein thrombosis in the lower extremity.  - No cystic structure found in the popliteal fossa.  *See table(s) above for measurements and observations. Electronically signed by Waverly Ferrari MD on 04/06/2021 at 11:42:33 AM.    Final    [place] EKG SITE RITE  Result Date: 04/03/2021 If Site Rite image not attached, placement could not be confirmed due to current cardiac rhythm.   Microbiology Recent Results (from the past 240 hour(s))  Resp Panel by RT-PCR (Flu A&B, Covid) Nasopharyngeal Swab     Status: Abnormal   Collection Time: 03/31/2021 11:20 AM   Specimen: Nasopharyngeal Swab; Nasopharyngeal(NP) swabs in vial transport medium  Result Value Ref Range Status   SARS Coronavirus 2 by RT PCR POSITIVE (A) NEGATIVE Final    Comment: RESULT CALLED TO, READ BACK BY  AND VERIFIED WITH: RN J MULLI [id number] AT 1328 BY CM (NOTE) SARS-CoV-2 target nucleic acids are DETECTED.  The SARS-CoV-2 RNA is generally detectable in upper respiratory specimens during the acute phase of infection. Positive results are indicative of the presence of the identified virus, but  do not rule out bacterial infection or co-infection with other pathogens not detected by the test. Clinical correlation with patient history and other diagnostic information is necessary to determine patient infection status. The expected result is Negative.  Fact Sheet for Patients: BloggerCourse.com  Fact Sheet for Healthcare Providers: SeriousBroker.it  This test is not yet approved or cleared by the [place] FDA and  has been authorized for detection and/or diagnosis of SARS-CoV-2 by FDA under an Emergency Use Authorization (EUA).  This EUA will remain in effect (meaning this test can be u sed) for the duration of  the COVID-19 declaration under Section 564(b)(1) of the Act, 21 U.S.C. section 360bbb-3(b)(1), unless the authorization is terminated or revoked sooner.     Influenza A by PCR NEGATIVE NEGATIVE Final   Influenza B by PCR NEGATIVE NEGATIVE Final    Comment: (NOTE) The Xpert Xpress SARS-CoV-2/FLU/RSV plus assay is intended as an aid in the diagnosis of influenza from Nasopharyngeal swab specimens and should not be used as a sole basis for treatment. Nasal washings and aspirates are unacceptable for Xpert Xpress SARS-CoV-2/FLU/RSV testing.  Fact Sheet for Patients: BloggerCourse.com  Fact Sheet for Healthcare Providers: SeriousBroker.it  This test is not yet approved or cleared by the [place] FDA and has been authorized for detection and/or diagnosis of SARS-CoV-2 by FDA under an Emergency Use Authorization (EUA). This EUA will remain in effect (meaning this test can be used) for the duration of the COVID-19 declaration under Section 564(b)(1) of the Act, 21 U.S.C. section 360bbb-3(b)(1), unless the authorization is terminated or revoked.  Performed at [facility], [address], [place], [place] [address]   Blood Culture (routine x 2)      Status: None   Collection Time: 03/29/2021 11:20 AM   Specimen: BLOOD LEFT ARM  Result Value Ref Range Status   Specimen Description BLOOD LEFT ARM  Final   Special Requests   Final    BOTTLES DRAWN AEROBIC AND ANAEROBIC Blood Culture results may not be optimal due to an inadequate volume of blood received in culture bottles   Culture   Final    NO GROWTH 5 DAYS Performed at [facility], [address] [place], [place] [address]    Report Status Apr 13, 2021 FINAL  Final  Blood Culture (routine x 2)     Status: None   Collection Time: 03/22/2021 11:25 AM   Specimen: BLOOD  Result Value Ref Range Status   Specimen Description BLOOD LEFT ANTECUBITAL  Final   Special Requests   Final    BOTTLES DRAWN AEROBIC AND ANAEROBIC Blood Culture results may not be optimal due to an inadequate volume of blood received in culture bottles   Culture   Final    NO GROWTH 5 DAYS Performed at [facility], [address] [place], [place] [address]    Report Status 04/13/2021 FINAL  Final  MRSA Next Gen by PCR, Nasal     Status: Abnormal   Collection Time: 04/10/2021  2:06 PM   Specimen: Nasal Mucosa; Nasal Swab  Result Value Ref Range Status   MRSA by PCR Next Gen DETECTED (A) NOT DETECTED Final    Comment: RESULT  CALLED TO, READ BACK BY AND VERIFIED WITH: J O NEAL,RN@[id number]  04/10/2021 MK (NOTE) The GeneXpert MRSA Assay (FDA approved for NASAL specimens only), is one component of a comprehensive MRSA colonization surveillance program. It is not intended to diagnose MRSA infection nor to guide or monitor treatment for MRSA infections. Test performance is not FDA approved in patients less than 47 years old. Performed at [facility], [address] [place], [place] [address]   Urine Culture     Status: None   Collection Time: 04/03/21  9:04 AM   Specimen: Urine, Catheterized  Result Value Ref Range Status   Specimen Description URINE, CATHETERIZED  Final   Special Requests NONE   Final   Culture   Final    NO GROWTH Performed at [facility], [address] [place], [place] [address]    Report Status 04-23-21 FINAL  Final    Lab Basic Metabolic Panel: Recent Labs  Lab 04/05/21 0414 04/05/21 1633 04/05/21 2045 04/06/21 0348 04/06/21 0450 04/06/21 0911 04/06/21 1638 April 23, 2021 0006 04/23/21 0500 23-Apr-2021 0800  NA 135   < >  --    < > 137 143 140 140 139 139  K 3.9   < >  --    < > 2.6* 2.8* 3.3* 3.4* 3.6 3.8  CL 110  --   --   --  112*  --  109 106 103 102  CO2 18*  --   --   --  19*  --  22 25 27 27   GLUCOSE 246*  --   --   --  108*  --  113* 98 94 83  BUN 24*  --   --   --  23*  --  24* 25* 25* 29*  CREATININE 0.75  --   --   --  0.64  --  0.67 0.71 0.77 0.77  CALCIUM 7.7*  --   --   --  7.5*  --  7.1* 7.1* 7.3* 7.0*  MG 2.0  --  1.9  --  1.9  --  2.0  --  1.8  --   PHOS 1.9*  --  1.9*  --  1.1*  --  3.9 4.0 3.7  --    < > = values in this interval not displayed.   Liver Function Tests: Recent Labs  Lab 03/29/2021 1120 04/03/21 0320  AST 25 30  ALT 20 17  ALKPHOS 146* 67  BILITOT 0.8 0.7  PROT 6.6 5.0*  ALBUMIN 2.8* 1.9*   No results for input(s): LIPASE, AMYLASE in the last 168 hours. Recent Labs  Lab 04/04/2021 1142  AMMONIA <10   CBC: Recent Labs  Lab 04/03/21 0320 04/03/21 0500 04/04/21 0415 04/04/21 1201 04/05/21 0414 04/05/21 1633 04/05/21 1734 04/06/21 0348 04/06/21 0450 04/06/21 0833 04/06/21 0911 04/23/2021 0500  WBC 2.5*  --  4.7  --  3.2*  --   --   --  1.6*  --   --  3.7*  NEUTROABS 1.6*  --  4.0  --  2.7  --   --   --  0.8*  --   --  3.4  HGB 10.9*   < > 10.1*   < > 9.2*   < > 8.2* 7.8* 8.4*  --  8.5* 9.6*  HCT 33.5*   < > 31.0*   < > 28.6*   < > 24.0* 23.0* 26.4*  --  25.0* 28.7*  MCV 81.5  --  81.2  --  82.7  --   --   --  83.8  --   --  81.5  PLT 183  --  181  --  112*  --   --   --  90* 94*  --  86*   < > = values in this interval not displayed.   Cardiac Enzymes: No results for input(s): CKTOTAL,  CKMB, CKMBINDEX, TROPONINI in the last 168 hours. Sepsis Labs: Recent Labs  Lab 04/12/2021 1142 04/08/2021 1621 04/08/2021 1649 03/21/2021 2245 04/03/21 0215 04/03/21 0320 04/04/21 0415 04/05/21 0414 04/06/21 0450 04-28-2021 0500  PROCALCITON  --   --  22.84  --   --   --   --  35.40  --   --   WBC  --   --   --   --   --    < > 4.7 3.2* 1.6* 3.7*  LATICACIDVEN 5.3* 2.8*  --  3.2* 2.7*  --   --   --   --   --    < > = values in this interval not displayed.    Procedures/Operations  ETT 04/03/21   Aalaya Yadao Mechele Collin 2021-04-28, 3:33 PM

## 2021-04-16 NOTE — Progress Notes (Signed)
Wife of pt asking for pt's wedding band.  No wedding band noted on patient or in the room.

## 2021-04-16 NOTE — Significant Event (Addendum)
PCCM Critical Event  Called to bedside for worsening hypotension. While at bedside patient noted on telemetry to be be in VF. CPR immediately initiated. Given Epi x 2, shocked x 1. Amiodarone given. Remained in VF after two rounds. Code was called and patient expired at 14:34 on 03/20/2021.  Sister available at bedside. Unable to reach wife due to busy signal. Will attempt again.  ADDENDUM: Able to reach wife ~1400 to update in person. She expressed appreciation for the medical care the team provided.

## 2021-04-16 NOTE — Progress Notes (Signed)
eLink Physician-Brief Progress Note Patient Name: [name] DOB: [birth year] MRN: [id number]   Date of Service  2021-05-05  HPI/Events of Note  Hypokalemia - K+ = 3.4 and Creatinine = 0.71.  eICU Interventions  Will replace K+.     Intervention Category Major Interventions: Electrolyte abnormality - evaluation and management  [name] Eugene May 05, 2021, 1:39 AM

## 2021-04-16 NOTE — Progress Notes (Signed)
NAME:  [name], MRN:  [id number], DOB:  10-Jun-1966, LOS: 5 ADMISSION DATE:  03/23/2021, CONSULTATION DATE:  04/03/21 REFERRING MD:  Danise Edge, CHIEF COMPLAINT:  SHOB   Brief History   [name] is a 55 y.o. with a pertinent PMH of HLD, HTN, osteomyelitis, CAD SP CABG x4, who presented with fevers, chills, cough,  and SHOB. On admission, patient developed acute hypoxic respiratory failure requiring intubation and admitted for COVID pneumonia.   Past Medical History   has a past medical history of Anginal pain (HCC), Ataxia, CAD (coronary artery disease), Diabetes mellitus without complication (HCC), Dizziness, Hyperlipidemia, Hypertension, and Near syncope. Significant Hospital Events   10/18 admitted 10/19 hypoxic respiratory failure s/p intubation  10/20-22 progressive deterioration, failed proning trial  Consults:    Procedures:  10/19 intubated 10/19 CVC  Significant Diagnostic Tests:  CTA  Result Date: 03/19/2021 No evidence of pulmonary embolism. Multifocal pneumonia in this patient with known COVID, left lower lobe predominant. Small left pleural effusion. Aortic Atherosclerosis (ICD10-I70.0).  DG Abd 1 View Result Date: 04/03/2021 IMPRESSION: NG tip in the region of the gastric antrum or pylorus.   DG CHEST PORT 1 VIEW Result Date: 04/03/2021 IMPRESSION: Tubes and lines as described above.  No pneumothorax is noted. Remainder of the exam is stable from the prior study.   DG CHEST PORT 1 VIEW Result Date: 04/03/2021 Multifocal pneumonia with similar appearance to the chest CT acquired at 2:31 a.m.   Micro Data:  10/18 COVID positive  10/18 MRSA PCR positive  10/18 Bcx no growth at 24 hours Ucx; negative ' Pct 22 on admit  Antimicrobials:  Cefepime 10/18> Vanc 10/18>   Interim History/Subjective:  Saturating in 70s, heavily sedated.  Objective:  Blood pressure 105/63, pulse (!) 112, temperature 98.5 F (36.9 C), temperature source Oral, resp.  rate 20, height 5\' 7"  (1.702 m), weight 75.4 kg, SpO2 (!) 75 %.    Vent Mode: PRVC FiO2 (%):  [100 %] 100 % Set Rate:  [35 bmp] 35 bmp Vt Set:  [390 mL] 390 mL PEEP:  [12 cmH20] 12 cmH20 Plateau Pressure:  [32 cmH20-36 cmH20] 36 cmH20   Intake/Output Summary (Last 24 hours) at May 02, 2021 0757 Last data filed at 05-02-2021 0700 Gross per 24 hour  Intake 5886.44 ml  Output 3810 ml  Net 2076.44 ml    Filed Weights   04/05/21 0408 04/06/21 0530 05-02-21 0500  Weight: 70.3 kg 74.8 kg 75.4 kg    Examination: Ill appearing man on vent Lungs with diminished breath sounds Plat 36, DP 24 Abdomen firm, hypoactive BS Diffuse anasarca RLE wrapped RASS -5  On levo/vaso/bicarb/lasix drips  WBC a little better today Probably having some DIC Did not get ABG, nothing really it will add to management at this point CXR no PTX, continued severe bilateral airspace disease  Resolved Hospital Problem list   none  Assessment & Plan:  COVID ARDS +/- CAP- intubated 10/19; worsening, now unable to oxygentate/ventilate despite maximal support; failed proning trial 10/22.  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  Proning ineffective.  Patient is still deteriorating into worsening multiorgan failure.  I do not think he will live through this and this has been relayed to family.  They remain hopeful for a miracle.  We will discuss code status again today as I do not think chest compressions or shocks  will meaningfully change outcome.  Continue current support. Death expected this hospitalization.  Best Practice:  Diet: tubefeeds Pain/Anxiety/Delirium protocol (if indicated): yes,  VAP protocol (if indicated): yes DVT prophylaxis: prophylactic heparin  GI prophylaxis: PPI Glucose control: SSI Yes  Lines: Central line Foley:  Yes, and it is still needed Mobility: bed rest, Code Status: full code Family Communication: pending Disposition: ICU   Patient critically ill due to shock, respiratory failure Interventions to address this today vent, family discussions and pressor titration Risk of deterioration without these interventions is high  I personally spent 44 minutes providing critical care not including any separately billable procedures  Myrla Halsted MD [facility]  Prefer epic messenger for cross cover needs If after hours, please call E-link

## 2021-04-16 DEATH — deceased

## 2021-04-23 MED FILL — Medication: Qty: 1 | Status: AC

## 2021-06-03 ENCOUNTER — Ambulatory Visit: Payer: Medicaid Other

## 2021-06-03 ENCOUNTER — Encounter (HOSPITAL_COMMUNITY): Payer: Medicaid Other

## 2021-06-26 ENCOUNTER — Ambulatory Visit: Payer: Medicaid Other | Admitting: Family Medicine

## 2021-08-14 ENCOUNTER — Ambulatory Visit: Payer: Medicaid Other | Admitting: Cardiology
